# Patient Record
Sex: Male | Born: 1950
Health system: Southern US, Community
[De-identification: ages and names within clinical notes are randomized; demographics above are authoritative.]

## PROBLEM LIST (undated history)

## (undated) DIAGNOSIS — C801 Malignant (primary) neoplasm, unspecified: Secondary | ICD-10-CM

## (undated) DIAGNOSIS — N529 Male erectile dysfunction, unspecified: Secondary | ICD-10-CM

## (undated) DIAGNOSIS — J302 Other seasonal allergic rhinitis: Secondary | ICD-10-CM

## (undated) DIAGNOSIS — E042 Nontoxic multinodular goiter: Secondary | ICD-10-CM

## (undated) DIAGNOSIS — E785 Hyperlipidemia, unspecified: Secondary | ICD-10-CM

## (undated) DIAGNOSIS — N400 Enlarged prostate without lower urinary tract symptoms: Secondary | ICD-10-CM

## (undated) DIAGNOSIS — J40 Bronchitis, not specified as acute or chronic: Secondary | ICD-10-CM

## (undated) DIAGNOSIS — J189 Pneumonia, unspecified organism: Secondary | ICD-10-CM

## (undated) DIAGNOSIS — E0590 Thyrotoxicosis, unspecified without thyrotoxic crisis or storm: Secondary | ICD-10-CM

## (undated) DIAGNOSIS — N183 Chronic kidney disease, stage 3 unspecified: Secondary | ICD-10-CM

## (undated) DIAGNOSIS — K219 Gastro-esophageal reflux disease without esophagitis: Secondary | ICD-10-CM

## (undated) DIAGNOSIS — I10 Essential (primary) hypertension: Secondary | ICD-10-CM

## (undated) HISTORY — PX: BACK SURGERY: SHX140

## (undated) HISTORY — PX: COLONOSCOPY: SHX174

## (undated) HISTORY — DX: Pneumonia, unspecified organism: J18.9

## (undated) HISTORY — PX: MULTIPLE TOOTH EXTRACTIONS: SHX2053

## (undated) HISTORY — DX: Malignant (primary) neoplasm, unspecified: C80.1

---

## 2002-08-09 LAB — HM COLONOSCOPY

## 2005-09-20 ENCOUNTER — Ambulatory Visit: Payer: Self-pay | Admitting: Family Medicine

## 2005-10-22 ENCOUNTER — Ambulatory Visit: Payer: Self-pay | Admitting: Family Medicine

## 2005-11-24 ENCOUNTER — Ambulatory Visit: Payer: Self-pay | Admitting: Family Medicine

## 2005-12-07 ENCOUNTER — Ambulatory Visit: Payer: Self-pay | Admitting: Family Medicine

## 2005-12-30 ENCOUNTER — Ambulatory Visit: Payer: Self-pay | Admitting: Family Medicine

## 2006-05-17 DIAGNOSIS — E785 Hyperlipidemia, unspecified: Secondary | ICD-10-CM | POA: Insufficient documentation

## 2006-05-17 DIAGNOSIS — I10 Essential (primary) hypertension: Secondary | ICD-10-CM | POA: Insufficient documentation

## 2006-06-21 ENCOUNTER — Ambulatory Visit: Payer: Self-pay | Admitting: Family Medicine

## 2007-04-18 ENCOUNTER — Ambulatory Visit: Payer: Self-pay | Admitting: Family Medicine

## 2007-05-17 ENCOUNTER — Encounter: Admission: RE | Admit: 2007-05-17 | Discharge: 2007-05-17 | Payer: Self-pay | Admitting: Family Medicine

## 2007-05-17 ENCOUNTER — Ambulatory Visit: Payer: Self-pay | Admitting: Family Medicine

## 2007-05-23 ENCOUNTER — Telehealth: Payer: Self-pay | Admitting: Family Medicine

## 2007-05-23 DIAGNOSIS — E041 Nontoxic single thyroid nodule: Secondary | ICD-10-CM | POA: Insufficient documentation

## 2007-05-24 ENCOUNTER — Encounter: Admission: RE | Admit: 2007-05-24 | Discharge: 2007-05-24 | Payer: Self-pay | Admitting: Family Medicine

## 2007-05-25 ENCOUNTER — Encounter: Payer: Self-pay | Admitting: Family Medicine

## 2007-05-25 LAB — CONVERTED CEMR LAB: T3, Free: 3.6 pg/mL (ref 2.3–4.2)

## 2007-05-26 ENCOUNTER — Telehealth: Payer: Self-pay | Admitting: Family Medicine

## 2007-06-09 ENCOUNTER — Encounter: Payer: Self-pay | Admitting: Family Medicine

## 2007-06-13 ENCOUNTER — Encounter: Admission: RE | Admit: 2007-06-13 | Discharge: 2007-06-13 | Payer: Self-pay | Admitting: Internal Medicine

## 2007-06-13 ENCOUNTER — Other Ambulatory Visit: Admission: RE | Admit: 2007-06-13 | Discharge: 2007-06-13 | Payer: Self-pay | Admitting: Interventional Radiology

## 2007-06-13 ENCOUNTER — Encounter (INDEPENDENT_AMBULATORY_CARE_PROVIDER_SITE_OTHER): Payer: Self-pay | Admitting: Interventional Radiology

## 2007-06-13 ENCOUNTER — Encounter: Payer: Self-pay | Admitting: Family Medicine

## 2007-06-20 ENCOUNTER — Encounter: Payer: Self-pay | Admitting: Family Medicine

## 2007-07-26 ENCOUNTER — Ambulatory Visit: Payer: Self-pay | Admitting: Family Medicine

## 2007-07-28 LAB — CONVERTED CEMR LAB: Chlamydia, Swab/Urine, PCR: NEGATIVE

## 2008-05-08 ENCOUNTER — Encounter: Payer: Self-pay | Admitting: Family Medicine

## 2009-10-03 ENCOUNTER — Ambulatory Visit: Payer: Self-pay | Admitting: Family Medicine

## 2010-06-26 ENCOUNTER — Telehealth: Payer: Self-pay | Admitting: Family Medicine

## 2010-07-10 ENCOUNTER — Encounter: Payer: Self-pay | Admitting: Family Medicine

## 2010-07-10 LAB — CONVERTED CEMR LAB
ALT: 41 units/L
Free T4: 0.68 ng/dL
Lymphocytes Relative: 33.1 %
Platelets: 247 10*3/uL
WBC: 5 10*3/uL

## 2010-07-23 ENCOUNTER — Ambulatory Visit: Payer: Self-pay | Admitting: Family Medicine

## 2010-07-23 DIAGNOSIS — N529 Male erectile dysfunction, unspecified: Secondary | ICD-10-CM | POA: Insufficient documentation

## 2010-09-02 ENCOUNTER — Encounter: Payer: Self-pay | Admitting: Family Medicine

## 2010-09-02 LAB — CONVERTED CEMR LAB: Total CHOL/HDL Ratio: 3.2

## 2010-09-08 NOTE — Progress Notes (Signed)
Summary: refill request   Phone Note Refill Request Message from:  Patient on June 26, 2010 9:05 AM  Pt needs a refill on Viagra sent to [facility] at the corner of [address] their phone number is [phone]... Please call patient back and let him know the status on this by calling back [phone]  Initial call taken by: Michaelle Copas,  June 26, 2010 9:05 AM  Follow-up for Phone Call        call and notified pt Follow-up by: Avon Gully CMA, Duncan Dull),  June 26, 2010 12:38 PM    New/Updated Medications: VIAGRA 50 MG TABS (SILDENAFIL CITRATE) Take 1 tablet by mouth once a day as needed 1 hour before intercourse. Prescriptions: VIAGRA 50 MG TABS (SILDENAFIL CITRATE) Take 1 tablet by mouth once a day as needed 1 hour before intercourse.  #10 x 0   Entered and Authorized by:   Nani Gasser MD   Signed by:   Nani Gasser MD on 06/26/2010   Method used:   Electronically to        [facility] [address] #[id number]* (retail)       [address] [place], [place]  [address]       Ph: [phone]       Fax: [phone]   RxID:   [id number]

## 2010-09-08 NOTE — Assessment & Plan Note (Signed)
Summary: Facial Pain   Vital Signs:  Patient profile:   60 year old male Height:      72 inches Weight:      189 pounds BMI:     25.73 Temp:     98.5 degrees F oral Pulse rate:   94 / minute BP sitting:   160 / 95  (left arm) Cuff size:   regular  Vitals Entered By: Kathlene November (October 03, 2009 9:47 AM) CC: had abcess tooth on Jan. 16th- feels like infection is in his ears, jaws and sinus cavity   Primary Care Provider:  Linford Arnold, C  CC:  had abcess tooth on Jan. 16th- feels like infection is in his ears and jaws and sinus cavity.  History of Present Illness: had abcess tooth on Jan. 16th- feels like infection is in his ears, jaws and sinus cavity. Went to the dentist and was told had an abscess nerve root with tracking infection.  Had root canal that day and was given an ABX (clindamycin for 10 days). Then had infection into his jaw and then last week had PNA. Now having left facial and jaw pain and pain behind his left eye.  Went on doxycycline for  PNA about a week ago.  On last tab of that this morning. Feels like has been hit in the eye. 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 NO worsening or alleviating sxs.   Current Medications (verified): 1)  Crestor 10 Mg Tabs (Rosuvastatin Calcium) .... Take 1 Tablet By Mouth Once A Day 2)  Ziac 2.5-6.25 Mg Tabs (Bisoprolol-Hydrochlorothiazide) .... Take 1 Tablet By Mouth Once A Day 3)  Augmentin 875-125 Mg Tabs (Amoxicillin-Pot Clavulanate) .... Take 1 Tablet By Mouth Two Times A Day For 10 Days 4)  Advair Diskus 250-50 Mcg/dose Aepb (Fluticasone-Salmeterol) .... One Puff Twice A Day 5)  Prevacid 30 Mg Cpdr (Lansoprazole) .... Take One Tablet By Mouth Once A Day 6)  Restoril 15 Mg Caps (Temazepam) .... Take One- Two Capsules By Mouth At Bedtime 7)  Proventil Hfa 108 (90 Base) Mcg/act Aers (Albuterol Sulfate) .... Use 2 Piuffs Four Times A Day 8)  Tapazole 10 Mg Tabs (Methimazole) .... Take One Tablet By Mouth Once A Day  Allergies  (verified): No Known Drug Allergies  Comments:  Nurse/Medical Assistant: The patient's medications and allergies were reviewed with the patient and were updated in the Medication and Allergy Lists. Kathlene November (October 03, 2009 9:49 AM)  Past History:  Past Medical History: Last updated: 05/17/2006 Gets recurrent PNA in [place]  Social History: Reviewed history from 05/17/2006 and no changes required. Works in Production designer, theatre/television/film for [employer].  27yrs education.  Never smoked, no drugs, no EtOH, 1 coffe daily, runner.  Physical Exam  General:  Well-developed,well-nourished,in no acute distress; alert,appropriate and cooperative throughout examination Head:  Normocephalic and atraumatic without obvious abnormalities. No apparent alopecia or balding.Tender over the TMJ area on teh left and over the maxillary sinuses.  Eyes:  No corneal or conjunctival inflammation noted. EOMI. Perrla.  Ears:  External ear exam shows no significant lesions or deformities.  Otoscopic examination reveals clear canals, tympanic membranes are intact bilaterally without bulging, retraction, inflammation or discharge. Hearing is grossly normal bilaterally. Nose:  External nasal examination shows no deformity or inflammation Mouth:  Oral mucosa and oropharynx without lesions or exudates.  Teeth in good repair. no gum lesions or swelling.  Neck:  No deformities, masses, or tenderness noted. Lungs:  Normal respiratory effort, chest expands symmetrically. Lungs are clear  to auscultation, no crackles or wheezes. Heart:  Normal rate and regular rhythm. S1 and S2 normal without gallop, murmur, click, rub or other extra sounds. Skin:  no rashes.   Cervical Nodes:  No lymphadenopathy noted Psych:  Cognition and judgment appear intact. Alert and cooperative with normal attention span and concentration. No apparent delusions, illusions, hallucinations   Impression & Recommendations:  Problem # 1:  FACIAL PAIN  (ICD-784.0) Unclear etiology. consider that is is a partially treaed abscess ir infection that may be tracking into the jaw.  Will start Augmentin for broad specrum coverage. also note BP is elevated today so also let hime know that this may be contributing to his pain.  Also monitor for any neurologic sxs.  If this happens over teh weekend needs to go to ED. Or if sxs worsen then need to go to ED because may need IV abx and CT scan. Rocephin IM 2gram given today.  His updated medication list for this problem includes:    Ziac 2.5-6.25 Mg Tabs (Bisoprolol-hydrochlorothiazide) .Marland Kitchen... Take 1 tablet by mouth once a day  Complete Medication List: 1)  Crestor 10 Mg Tabs (Rosuvastatin calcium) .... Take 1 tablet by mouth once a day 2)  Ziac 2.5-6.25 Mg Tabs (Bisoprolol-hydrochlorothiazide) .... Take 1 tablet by mouth once a day 3)  Augmentin 875-125 Mg Tabs (Amoxicillin-pot clavulanate) .... Take 1 tablet by mouth two times a day for 10 days 4)  Advair Diskus 250-50 Mcg/dose Aepb (Fluticasone-salmeterol) .... One puff twice a day 5)  Prevacid 30 Mg Cpdr (Lansoprazole) .... Take one tablet by mouth once a day 6)  Restoril 15 Mg Caps (Temazepam) .... Take one- two capsules by mouth at bedtime 7)  Proventil Hfa 108 (90 Base) Mcg/act Aers (Albuterol sulfate) .... Use 2 piuffs four times a day 8)  Tapazole 10 Mg Tabs (Methimazole) .... Take one tablet by mouth once a day Prescriptions: AUGMENTIN 875-125 MG TABS (AMOXICILLIN-POT CLAVULANATE) Take 1 tablet by mouth two times a day for 10 days  #20 x 0   Entered and Authorized by:   Nani Gasser MD   Signed by:   Nani Gasser MD on 10/03/2009   Method used:   Electronically to        [facility] [id number]* (retail)       [address] [place], [place]  [address]       Ph: [phone]       Fax: [phone]   RxID:   [id number]    Medication Administration  Injection # 1:    Medication: Rocephin  250mg     Route: IM     Site: RUOQ gluteus    Exp Date: 11/08/2011    Lot #: [id number]    Mfr: Novaplus    Comments: Rocephin 2 grams given    Patient tolerated injection without complications    Given by: Kathlene November (October 03, 2009 10:24 AM)  Orders Added: 1)  Est. Patient Level IV [72536]

## 2010-09-10 NOTE — Assessment & Plan Note (Signed)
Summary: Discuss his Viagra medication-   Vital Signs:  Patient profile:   60 year old male Height:      72 inches Weight:      173 pounds Pulse rate:   58 / minute BP sitting:   158 / 93  (right arm) Cuff size:   regular  Vitals Entered By: Avon Gully CMA, Duncan Dull) (July 23, 2010 9:11 AM)  Serial Vital Signs/Assessments:  Time      Position  BP       Pulse  Resp  Temp     By 9:33 AM             151/93                         Avon Gully CMA, (AAMA)  CC: discuss viagra meds   Primary Care Provider:  Linford Arnold, C  CC:  discuss viagra meds.  History of Present Illness: Lungs have been clear the last momths.   On the viagra. No CP, SOB, visionchanges. No sinus  congestion.  Happy with the dose. Uses about once a week but wants full  rx.   Brought in labs from PCP in [place]. cholesterol, etc looks great!!    Current Medications (verified): 1)  Crestor 10 Mg Tabs (Rosuvastatin Calcium) .... Take 1 Tablet By Mouth Once A Day 2)  Ziac 2.5-6.25 Mg Tabs (Bisoprolol-Hydrochlorothiazide) .... Take 1 Tablet By Mouth Once A Day 3)  Advair Diskus 250-50 Mcg/dose Aepb (Fluticasone-Salmeterol) .... One Puff Twice A Day 4)  Prevacid 30 Mg Cpdr (Lansoprazole) .... Take One Tablet By Mouth Once A Day 5)  Restoril 15 Mg Caps (Temazepam) .... Take One- Two Capsules By Mouth At Bedtime 6)  Proventil Hfa 108 (90 Base) Mcg/act Aers (Albuterol Sulfate) .... Use 2 Piuffs Four Times A Day 7)  Tapazole 10 Mg Tabs (Methimazole) .... Take One Tablet By Mouth Once A Day 8)  Viagra 50 Mg Tabs (Sildenafil Citrate) .... Take 1 Tablet By Mouth Once A Day As Needed 1 Hour Before Intercourse.  Allergies (verified): No Known Drug Allergies  Comments:  Nurse/Medical Assistant: The patient's medications and allergies were reviewed with the patient and were updated in the Medication and Allergy Lists. 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  Never smoked, no drugs, no EtOH, 1 coffe daily, runner.  Physical Exam  General:  Well-developed,well-nourished,in no acute distress; alert,appropriate and cooperative throughout examination Lungs:  Normal respiratory effort, chest expands symmetrically. Lungs are clear to auscultation, no crackles or wheezes. Heart:  Normal rate and regular rhythm. S1 and S2 normal without gallop, murmur, click, rub or other extra sounds.   Impression & Recommendations:  Problem # 1:  ERECTILE DYSFUNCTION, ORGANIC (ICD-607.84) Will refill medication but let pt know he really needs to get his BP under control.  His updated medication list for this problem includes:    Viagra 50 Mg Tabs (Sildenafil citrate) .Marland Kitchen... Take 1 tablet by mouth once a day as needed 1 hour before intercourse.  Problem # 2:  HYPERTENSION, BENIGN SYSTEMIC (ICD-401.1) I am a little confused about who is managing his BP. Hi BP is clearly not well controlled.  He says his PCP is in [place] so not sure why  he is coming to our office. I do note in the past often comes for acute visits.  I will send this note to his PCP.  His updated medication list for this problem includes:    Ziac 2.5-6.25 Mg Tabs (Bisoprolol-hydrochlorothiazide) .Marland Kitchen... Take 1 tablet by mouth once a day  Complete Medication List: 1)  Crestor 10 Mg Tabs (Rosuvastatin calcium) .... Take 1 tablet by mouth once a day 2)  Ziac 2.5-6.25 Mg Tabs (Bisoprolol-hydrochlorothiazide) .... Take 1 tablet by mouth once a day 3)  Prevacid 30 Mg Cpdr (Lansoprazole) .... Take one tablet by mouth once a day 4)  Restoril 15 Mg Caps (Temazepam) .... Take one- two capsules by mouth at bedtime 5)  Proventil Hfa 108 (90 Base) Mcg/act Aers (Albuterol sulfate) .... Use 2 piuffs four  times a day 6)  Tapazole 10 Mg Tabs (Methimazole) .... Take one tablet by mouth once a day 7)  Viagra 50 Mg Tabs (Sildenafil citrate) .... Take 1 tablet by mouth once a day as needed 1 hour before intercourse. Prescriptions: VIAGRA 50 MG TABS (SILDENAFIL CITRATE) Take 1 tablet by mouth once a day as needed 1 hour before intercourse.  #30 x 3   Entered and Authorized by:   Nani Gasser MD   Signed by:   Nani Gasser MD on 07/23/2010   Method used:   [facility] to        [id number]       [address]       [place], [place]  [address]       Ph: [phone] or [phone]       Fax: [phone]   RxID:   [id number]    Orders Added: 1)  Est. Patient Level III [id number]

## 2010-09-22 ENCOUNTER — Encounter: Payer: Self-pay | Admitting: Family Medicine

## 2010-09-22 ENCOUNTER — Ambulatory Visit (INDEPENDENT_AMBULATORY_CARE_PROVIDER_SITE_OTHER): Payer: BC Managed Care – PPO | Admitting: Family Medicine

## 2010-09-22 DIAGNOSIS — J209 Acute bronchitis, unspecified: Secondary | ICD-10-CM | POA: Insufficient documentation

## 2010-09-22 DIAGNOSIS — I10 Essential (primary) hypertension: Secondary | ICD-10-CM

## 2010-09-23 ENCOUNTER — Encounter (INDEPENDENT_AMBULATORY_CARE_PROVIDER_SITE_OTHER): Payer: BC Managed Care – PPO

## 2010-09-23 ENCOUNTER — Encounter: Payer: Self-pay | Admitting: Family Medicine

## 2010-09-23 DIAGNOSIS — I10 Essential (primary) hypertension: Secondary | ICD-10-CM

## 2010-09-30 NOTE — Assessment & Plan Note (Signed)
Summary: Bronchitis   Vital Signs:  Patient profile:   60 year old male Height:      72 inches Weight:      173 pounds O2 Sat:      98 % Temp:     98.4 degrees F oral Pulse rate:   80 / minute BP sitting:   168 / 100  (right arm) Cuff size:   regular  Vitals Entered By: Avon Gully CMA, Duncan Dull) (September 22, 2010 8:54 AM) CC: pt feels he has walking pneumonia   Primary Care Provider:  Linford Arnold, C  CC:  pt feels he has walking pneumonia.  History of Present Illness: Note didn't take BP meds yet this AM. Usually takes with food but he hasn't eaten yet today. Says normall y BP well controlled.   Hasn't felt well for 4 weeks. Low grade temp in teh afternoons. ON the second round for doxycyline.  No CXR.  Feels not better. NO SOB or CP. No nausea.  Occ cough but is yellow and productive.  Using mucinex and using Advair. No GI sxs. Says doesn't feel horrible but doesn't feel good.  He has hx of recurrent PNA over the last 15 years.   REtinal detachment about 2 weeks ago. Seeing Dr. Clearance Coots.    Current Medications (verified): 1)  Crestor 10 Mg Tabs (Rosuvastatin Calcium) .... Take 1 Tablet By Mouth Once A Day 2)  Ziac 2.5-6.25 Mg Tabs (Bisoprolol-Hydrochlorothiazide) .... Take 1 Tablet By Mouth Once A Day 3)  Prevacid 30 Mg Cpdr (Lansoprazole) .... Take One Tablet By Mouth Once A Day 4)  Restoril 15 Mg Caps (Temazepam) .... Take One- Two Capsules By Mouth At Bedtime 5)  Proventil Hfa 108 (90 Base) Mcg/act Aers (Albuterol Sulfate) .... Use 2 Piuffs Four Times A Day 6)  Tapazole 10 Mg Tabs (Methimazole) .... Take One Tablet By Mouth Once A Day 7)  Viagra 50 Mg Tabs (Sildenafil Citrate) .... Take 1 Tablet By Mouth Once A Day As Needed 1 Hour Before Intercourse.  Allergies (verified): No Known Drug Allergies  Comments:  Nurse/Medical Assistant: The patient's medications and allergies were reviewed with the patient and were updated in the Medication and Allergy Lists. Avon Gully CMA, Duncan Dull) (September 22, 2010 8:56 AM)  Past History:  Past Medical History: Last updated: 05/17/2006 Gets recurrent PNA in [place]  Physical Exam  General:  Well-developed,well-nourished,in no acute distress; alert,appropriate and cooperative throughout examination Head:  Normocephalic and atraumatic without obvious abnormalities. No apparent alopecia or balding. Eyes:  No corneal or conjunctival inflammation noted. EOMI. Perrla. Ears:  External ear exam shows no significant lesions or deformities.  Otoscopic examination reveals clear canals, tympanic membranes are intact bilaterally without bulging, retraction, inflammation or discharge. Hearing is grossly normal bilaterally. Nose:  External nasal examination shows no deformity or inflammation.  Mouth:  Oral mucosa and oropharynx without lesions or exudates.  Teeth in good repair. Neck:  No deformities, masses, or tenderness noted. Lungs:  Corase BS at the bases bilaterally, rhonchi at the bases. No wheezing.  NO tachypnea or use of accessory muscles.  Heart:  Normal rate and regular rhythm. S1 and S2 normal without gallop, murmur, click, rub or other extra sounds. Skin:  no rashes.   Cervical Nodes:  No lymphadenopathy noted Psych:  Cognition and judgment appear intact. Alert and cooperative with normal attention span and concentration. No apparent delusions, illusions, hallucinations   Impression & Recommendations:  Problem # 1:  ACUTE BRONCHITIS (ICD-466.0) Since still having  fever in the afternoons so will change to a FQ. If not better in one week then will get a CXR. He really wants to avoid this if at all possible.  His updated medication list for this problem includes:    Proventil Hfa 108 (90 Base) Mcg/act Aers (Albuterol sulfate) ..... Use 2 piuffs four times a day    Levaquin 750 Mg Tabs (Levofloxacin) .Marland Kitchen... Take 1 tablet by mouth once a day x 5 days  Take antibiotics and other medications as directed. Encouraged  to push clear liquids, get enough rest, and take acetaminophen as needed. To be seen in 5-7 days if no improvement, sooner if worse.  Problem # 2:  HYPERTENSION, BENIGN SYSTEMIC (ICD-401.1) Recheck BP in about 2 weeks once feeling better and when has taken medication.  His updated medication list for this problem includes:    Ziac 2.5-6.25 Mg Tabs (Bisoprolol-hydrochlorothiazide) .Marland Kitchen... Take 1 tablet by mouth once a day  His updated medication list for this problem includes:    Ziac 2.5-6.25 Mg Tabs (Bisoprolol-hydrochlorothiazide) .Marland Kitchen... Take 1 tablet by mouth once a day  Complete Medication List: 1)  Crestor 10 Mg Tabs (Rosuvastatin calcium) .... Take 1 tablet by mouth once a day 2)  Ziac 2.5-6.25 Mg Tabs (Bisoprolol-hydrochlorothiazide) .... Take 1 tablet by mouth once a day 3)  Prevacid 30 Mg Cpdr (Lansoprazole) .... Take one tablet by mouth once a day 4)  Restoril 15 Mg Caps (Temazepam) .... Take one- two capsules by mouth at bedtime 5)  Proventil Hfa 108 (90 Base) Mcg/act Aers (Albuterol sulfate) .... Use 2 piuffs four times a day 6)  Tapazole 10 Mg Tabs (Methimazole) .... Take one tablet by mouth once a day 7)  Viagra 50 Mg Tabs (Sildenafil citrate) .... Take 1 tablet by mouth once a day as needed 1 hour before intercourse. 8)  Levaquin 750 Mg Tabs (Levofloxacin) .... Take 1 tablet by mouth once a day x 5 days  Patient Instructions: 1)  Follow up in one-two week for BP check when better.  2)  Call if not better in one week. Fatigue may linger longer.   Prescriptions: LEVAQUIN 750 MG TABS (LEVOFLOXACIN) Take 1 tablet by mouth once a day x 5 days  #5 x 0   Entered and Authorized by:   Nani Gasser MD   Signed by:   Nani Gasser MD on 09/22/2010   Method used:   [phone]       [address]       [place], [place]  [address]       Ph: [phone] or [phone]       Fax: [phone]   RxID:    [id number]    Orders Added: 1)  Est. Patient Level IV [[address]]

## 2010-09-30 NOTE — Assessment & Plan Note (Signed)
Summary: Nurse BP Check - jr  Nurse Visit   Vital Signs:  Patient profile:   60 year old male Pulse rate:   93 / minute BP sitting:   130 / 86  (right arm) Cuff size:   regular  Vitals Entered By: Avon Gully CMA, Duncan Dull) (September 23, 2010 2:15 PM) {/* 12-20-05: added doc.temps for primary and secondary malig (med oncology) */ if str(DOCUMENT.TEMP_CCC_PCP,DOCUMENT.TEMP_CCC_REF_PROV,DOCUMENT.TEMP_CCC_VISIT_TYPE,DOCUMENT.TEMP_CCC_CC,DOCUMENT.TEMP_CCC_HPI_MLEF,DOCUMENT.TEMP_CCC_HPI_MLEF_1,DOCUMENT.TEMP_CCC_HPI_MLEF_2,DOCUMENT.TEMP_CCC_HPI_MLEF_3,DOCUMENT.TEMP_CCC_HPI_MLEF_4,DOCUMENT.TEMP_CCC_MED_ONC_HPI_PRIMARY_CA,DOCUMENT.TEMP_CCC_MED_ONC_HPI_SECONDARY_CA)="" then "" else ccc_hpi_intro_txt(DOCUMENT.TEMP_CCC_PCP,DOCUMENT.TEMP_CCC_REF_PROV,DOCUMENT.TEMP_CCC_VISIT_TYPE,DOCUMENT.TEMP_CCC_CC,DOCUMENT.TEMP_CCC_HPI_MLEF,DOCUMENT.TEMP_CCC_HPI_MLEF_1,DOCUMENT.TEMP_CCC_HPI_MLEF_2,DOCUMENT.TEMP_CCC_HPI_MLEF_3,DOCUMENT.TEMP_CCC_HPI_MLEF_4,_ccc_HPI_SS_PCP_txt,DOCUMENT.TEMP_CCC_MED_ONC_HPI_PRIMARY_CA,DOCUMENT.TEMP_CCC_MED_ONC_HPI_SECONDARY_CA,_ccc_HPI_SS_visit_type_txt,_ccc_HPI_SS_ref_provider_txt,_ccc_HPI_SS_cc_txt) endif <-COMPILE ERROR NEARBY History of Present Illness: BP check    Allergies: No Known Drug Allergies  Orders Added: 1)  Est. Patient Level I [[address]]    Impression & Recommendations:  Problem # 1:  HYPERTENSION, BENIGN SYSTEMIC (ICD-401.1) Much improved.  Continue current meds. His updated medication list for this problem includes:    Ziac 2.5-6.25 Mg Tabs (Bisoprolol-hydrochlorothiazide) .Marland Kitchen... Take 1 tablet by mouth once a day  BP today: 130/86 Prior BP: 168/100 (09/22/2010)  Labs Reviewed: Chol: 178 (09/02/2010)   HDL: 55 (09/02/2010)   LDL: 94 (09/02/2010)   TG: 144 (09/02/2010)  Complete Medication List: 1)  Crestor 10 Mg Tabs (Rosuvastatin calcium) .... Take 1 tablet by mouth once a day 2)  Ziac 2.5-6.25 Mg Tabs (Bisoprolol-hydrochlorothiazide) .... Take 1 tablet by  mouth once a day 3)  Prevacid 30 Mg Cpdr (Lansoprazole) .... Take one tablet by mouth once a day 4)  Restoril 15 Mg Caps (Temazepam) .... Take one- two capsules by mouth at bedtime 5)  Proventil Hfa 108 (90 Base) Mcg/act Aers (Albuterol sulfate) .... Use 2 piuffs four times a day 6)  Tapazole 10 Mg Tabs (Methimazole) .... Take one tablet by mouth once a day 7)  Viagra 50 Mg Tabs (Sildenafil citrate) .... Take 1 tablet by mouth once a day as needed 1 hour before intercourse. 8)  Levaquin 750 Mg Tabs (Levofloxacin) .... Take 1 tablet by mouth once a day x 5 days

## 2010-10-29 ENCOUNTER — Encounter: Payer: Self-pay | Admitting: Family Medicine

## 2010-10-30 ENCOUNTER — Ambulatory Visit (INDEPENDENT_AMBULATORY_CARE_PROVIDER_SITE_OTHER): Payer: BC Managed Care – PPO | Admitting: Family Medicine

## 2010-10-30 VITALS — BP 125/80 | HR 82 | Ht 72.0 in | Wt 170.0 lb

## 2010-10-30 DIAGNOSIS — K6289 Other specified diseases of anus and rectum: Secondary | ICD-10-CM

## 2010-10-30 MED ORDER — LIDOCAINE-HYDROCORTISONE ACE 3-0.5 % RE KIT: PACK | RECTAL | Status: DC

## 2010-10-30 NOTE — Progress Notes (Signed)
  Subjective:    Patient ID: Victor Price, male    DOB: 10/03/1950, 60 y.o.   MRN: [id number]  HPI Rectal pain on and off for the last several years. Started again a couple of nights ago. It was severe and sudden and had to take hydrocodone to relieve his pain.  Can sit, twist, etc to ease the pain.  Triggered after sex but not with BM. But can happen at anytime and can wake him up at night.  No hemorrhoids.  Overdue for  Colonoscopy. No blood in the stool. Hx of fissures but doesn't feel the same. Usually lasts about an hour.  No problems urinating recently. Does work out regularly.    We can test testoterone pain.  Had checked it several years ago.    Review of Systems     Objective:   Physical Exam  Cardiovascular: Normal rate, regular rhythm and normal heart sounds.   Pulmonary/Chest: Effort normal and breath sounds normal.  Abdominal: Soft. Bowel sounds are normal.  Genitourinary: Rectum normal and prostate normal. Rectal exam shows no external hemorrhoid, no fissure, no mass, no tenderness and anal tone normal. Guaiac negative stool.          Assessment & Plan:  Rectal pain- Discussed based on his sxs, I don't think this is his prostate. Sound like rectal spasm. Can treat with a lidocaine cream prn. Can consdier a a cream with nitroglycerin to improve blood flow as well prn. Also he is due for his colonoscopy so recommend he f/u with GI for further evaluaation and recommendation Test- he would like to have his T checked. He denies any fatigue but thinks it might have been borderline a couple of years ago.  No change in sexual function.

## 2010-10-31 LAB — PSA: PSA: 1.27 ng/mL (ref <=4.00)

## 2010-11-02 ENCOUNTER — Telehealth: Payer: Self-pay | Admitting: Family Medicine

## 2010-11-02 LAB — TESTOSTERONE, FREE, TOTAL, SHBG
Sex Hormone Binding: 27 nmol/L (ref 13–71)
Testosterone, Free: 59.9 pg/mL (ref 47.0–244.0)
Testosterone: 274.34 ng/dL (ref 250–890)

## 2010-11-02 NOTE — Telephone Encounter (Signed)
Call pt: Test is definitely on the low end of normal.  If would like we can start test replacement gel that you apply daily to skin

## 2010-11-04 NOTE — Telephone Encounter (Signed)
Called pt and left message on vm and to call back if ok with gel and pharm info

## 2010-11-04 NOTE — Telephone Encounter (Signed)
Pt called and states he is ok with starting test gel and can send [facility] to [facility]

## 2010-11-05 ENCOUNTER — Telehealth: Payer: Self-pay | Admitting: Family Medicine

## 2010-11-05 MED ORDER — TESTOSTERONE 20.25 MG/ACT (1.62%) TD GEL: 2.0000 | Freq: Every day | TRANSDERMAL | Status: DC

## 2010-11-05 MED ORDER — TESTOSTERONE 30 MG/ACT TD SOLN: 2.0000 | Freq: Every day | TRANSDERMAL | Status: DC

## 2010-11-05 NOTE — Telephone Encounter (Signed)
Addended by: Nani Gasser on: 11/05/2010 08:02 AM   Modules accepted: Orders

## 2010-11-05 NOTE — Telephone Encounter (Signed)
Change b/c of cost/insurance.

## 2010-12-04 ENCOUNTER — Other Ambulatory Visit: Payer: Self-pay | Admitting: Family Medicine

## 2011-01-07 ENCOUNTER — Ambulatory Visit (INDEPENDENT_AMBULATORY_CARE_PROVIDER_SITE_OTHER): Payer: BC Managed Care – PPO | Admitting: Family Medicine

## 2011-01-07 ENCOUNTER — Encounter: Payer: Self-pay | Admitting: Family Medicine

## 2011-01-07 VITALS — BP 126/76 | HR 60 | Temp 98.3°F | Ht 72.0 in | Wt 173.0 lb

## 2011-01-07 DIAGNOSIS — J40 Bronchitis, not specified as acute or chronic: Secondary | ICD-10-CM

## 2011-01-07 MED ORDER — TESTOSTERONE 30 MG/ACT TD SOLN: 2.0000 | Freq: Every day | TRANSDERMAL | Status: DC

## 2011-01-07 MED ORDER — LEVOFLOXACIN 750 MG PO TABS: 750.0000 mg | ORAL_TABLET | Freq: Every day | ORAL | Status: AC

## 2011-01-07 NOTE — Progress Notes (Signed)
  Subjective:    Patient ID: Victor Price, male    DOB: Oct 04, 1950, 60 y.o.   MRN: [id number]  HPI Has had productive cough. Completed doxy and no improvement. Fever at night. Yellow sputum.  In the lower left lung.  No nasal sxs. No ST. No GI sxs. On OTC meds.  No ear pain.    Mild SOB. Says he doesn't think it is PNA yet. He has had this recurrently for 15 years.   Also due for refill on his Axiron.  Had labs checked in March.    Review of Systems     Objective:   Physical Exam  Constitutional: He is oriented to person, place, and time. He appears well-developed and well-nourished.  HENT:  Head: Normocephalic and atraumatic.  Right Ear: External ear normal.  Left Ear: External ear normal.  Nose: Nose normal.  Mouth/Throat: Oropharynx is clear and moist.       TMs and canals are clear.   Eyes: Conjunctivae and EOM are normal. Pupils are equal, round, and reactive to light.  Neck: Neck supple. No thyromegaly present.  Cardiovascular: Normal rate and normal heart sounds.   Pulmonary/Chest: Effort normal.       Diffuse rhonchi at the left lung base.    Lymphadenopathy:    He has no cervical adenopathy.  Neurological: He is alert and oriented to person, place, and time.  Skin: Skin is warm and dry.  Psychiatric: He has a normal mood and affect.          Assessment & Plan:  Acute bronchitis x 3 weeks. Failed doxy. Will tx with Levaquin. Last episode was 3 months ago.  Call if not better in 10 days or if suddenly gets worse.

## 2011-01-08 ENCOUNTER — Other Ambulatory Visit: Payer: Self-pay | Admitting: Family Medicine

## 2011-01-08 ENCOUNTER — Other Ambulatory Visit: Payer: Self-pay | Admitting: *Deleted

## 2011-01-08 NOTE — Telephone Encounter (Signed)
Pt came in the office and states rx has not been sent to pharm. Pt wants a 90 day supply.will call into [facility]

## 2011-01-08 NOTE — Telephone Encounter (Signed)
Pharmacy called and said they had faxed [name] 3 times for axiron medication for the patient.   Plan:  Reviewed, and informed [facility] that the script was sent electronically for #90 ml/3 refills. Victor Newcomer, LPN [facility]

## 2011-04-22 ENCOUNTER — Ambulatory Visit (INDEPENDENT_AMBULATORY_CARE_PROVIDER_SITE_OTHER): Payer: BC Managed Care – PPO | Admitting: Family Medicine

## 2011-04-22 ENCOUNTER — Encounter: Payer: Self-pay | Admitting: Family Medicine

## 2011-04-22 VITALS — BP 125/77 | HR 60 | Wt 172.0 lb

## 2011-04-22 DIAGNOSIS — K625 Hemorrhage of anus and rectum: Secondary | ICD-10-CM

## 2011-04-22 DIAGNOSIS — Z23 Encounter for immunization: Secondary | ICD-10-CM

## 2011-04-22 NOTE — Patient Instructions (Signed)
We will call you with your referral.

## 2011-04-22 NOTE — Progress Notes (Signed)
  Subjective:    Patient ID: Victor Price, male    DOB: 02/22/1951, 60 y.o.   MRN: [id number]  HPI  Has been bleeding intermittantly for one week. He descrobes it as bright red. Had more bleeding yesterday. Doesn't look mixed in with the stool. He is mostly seen on the elevator. No known hx of hemorrhoids.  No pain with BM.  No abodominal pain or cramping. No fever.  1 st cousin with crohns dz. No colon cancer in the family.    Review of Systems     Objective:   Physical Exam  Constitutional: He is oriented to person, place, and time. He appears well-developed and well-nourished.  Genitourinary:       He has a small external hemorrhoid at the 6:00 position which is not inflamed or irritated. I see no fissure or tearing of the skin. Hemoccult was positive for blood. He had normal rectal tone and no mass was palpated. His prostate is mildly enlarged but no nodules.  Neurological: He is oriented to person, place, and time.  Skin: Skin is warm and dry.  Psychiatric: He has a normal mood and affect. His behavior is normal.          Assessment & Plan:  Rectal bleeding-will refer him to gastroenterology for further evaluation. Most likely consistent with a fissure which are typically benign. We discussed working on keeping the stool soft. He says he already takes fiber supplements and doesn't feel that his stools are currently hard. He is overdue for his repeat colonoscopy by about a year as well. He does have a history of polyps.

## 2011-06-28 ENCOUNTER — Encounter: Payer: Self-pay | Admitting: Family Medicine

## 2011-08-17 ENCOUNTER — Other Ambulatory Visit: Payer: Self-pay | Admitting: *Deleted

## 2011-08-17 MED ORDER — TESTOSTERONE 30 MG/ACT TD SOLN: 2.0000 | Freq: Every day | TRANSDERMAL | Status: DC

## 2011-08-24 ENCOUNTER — Ambulatory Visit (INDEPENDENT_AMBULATORY_CARE_PROVIDER_SITE_OTHER): Payer: BC Managed Care – PPO | Admitting: Family Medicine

## 2011-08-24 ENCOUNTER — Encounter: Payer: Self-pay | Admitting: Family Medicine

## 2011-08-24 DIAGNOSIS — J189 Pneumonia, unspecified organism: Secondary | ICD-10-CM

## 2011-08-24 MED ORDER — LEVOFLOXACIN 750 MG PO TABS: 750.0000 mg | ORAL_TABLET | Freq: Every day | ORAL | Status: DC

## 2011-08-24 NOTE — Progress Notes (Signed)
  Subjective:    Patient ID: Victor Price, male    DOB: 10-29-1950, 61 y.o.   MRN: [id number]  HPI Broncitis for 6-8 weeks.  Cough and chst congestion.  He feels like it is PNA.  Took the doxycyline and felt better but then got worse again. Then started cefuroxime and doesn't feel any better. Fever at night.  No cough and cold meds. Taking zyrtec. No GI sxs. Occ sinus HA, no nasal congesteion. Occ SOB with cough. No chest pain. Hx of recurrent PNA.    Review of Systems     Objective:   Physical Exam  Constitutional: He is oriented to person, place, and time. He appears well-developed and well-nourished.  HENT:  Head: Normocephalic and atraumatic.  Right Ear: External ear normal.  Left Ear: External ear normal.  Nose: Nose normal.  Mouth/Throat: Oropharynx is clear and moist.       TMs and canals are clear.   Eyes: Conjunctivae and EOM are normal. Pupils are equal, round, and reactive to light.  Neck: Neck supple. No thyromegaly present.  Cardiovascular: Normal rate and normal heart sounds.   Pulmonary/Chest: Effort normal.       Crackles and rhonchi in the left base Some radiating ronchi in both upper lungs.   Lymphadenopathy:    He has no cervical adenopathy.  Neurological: He is alert and oriented to person, place, and time.  Skin: Skin is warm and dry.  Psychiatric: He has a normal mood and affect.          Assessment & Plan:  PNA - based on exam I do think he clinically has pneumonia. He  Has failed doxy and cefuroxime so will tx with a floroquinolone. I will treat with Levaquin 750 mg x7 days. If not better in one week we will get a chest x-ray to make sure no loculation of fluid. He says sometimes hanging outside the bar and having someone beast on his chest will actually make him feel better help him cough up a lot of sputum. I encouraged him to continue due to system is slightly nasal and chest PT. Declined cough med.   HTN -  Blood pressure is normally well  controlled.it is elevated today but may be secondary to his illness. I asked him to come back for blood pressure recheck in about 2 weeks.

## 2011-08-24 NOTE — Patient Instructions (Signed)
Call if not better in one week and will get a chest xray.     Pneumonia, Adult Pneumonia is an infection of the lungs. It may be caused by a germ (virus or bacteria). Some types of pneumonia can spread easily from person to person. This can happen when you cough or sneeze. HOME CARE  Only take medicine as told by your doctor.     Take your medicine (antibiotics) as told. Finish it even if you start to feel better.     Do not smoke.     You may use a vaporizer or humidifier in your room. This can help loosen thick spit (mucus).     Sleep so you are almost sitting up (semi-upright). This helps reduce coughing.     Rest.  A shot (vaccine) can help prevent pneumonia. Shots are often advised for:  People over 80 years old.     Patients on chemotherapy.     People with long-term (chronic) lung problems.     People with immune system problems.  GET HELP RIGHT AWAY IF:    You are getting worse.     You cannot control your cough, and you are losing sleep.     You cough up blood.     Your pain gets worse, even with medicine.     You have a fever.     Any of your problems are getting worse, not better.     You have shortness of breath or chest pain.  MAKE SURE YOU:    Understand these instructions.     Will watch your condition.     Will get help right away if you are not doing well or get worse.  Document Released: 01/12/2008 Document Revised: 04/07/2011 Document Reviewed: 10/16/2010 [facility] Patient Information 2012 [place], [place].

## 2011-08-30 ENCOUNTER — Other Ambulatory Visit: Payer: Self-pay | Admitting: Family Medicine

## 2011-09-03 ENCOUNTER — Encounter: Payer: Self-pay | Admitting: Family Medicine

## 2011-09-03 ENCOUNTER — Ambulatory Visit: Payer: BC Managed Care – PPO | Admitting: Family Medicine

## 2011-09-03 ENCOUNTER — Ambulatory Visit (INDEPENDENT_AMBULATORY_CARE_PROVIDER_SITE_OTHER): Payer: BC Managed Care – PPO | Admitting: Family Medicine

## 2011-09-03 DIAGNOSIS — J189 Pneumonia, unspecified organism: Secondary | ICD-10-CM

## 2011-09-03 MED ORDER — LEVOFLOXACIN 750 MG PO TABS: 750.0000 mg | ORAL_TABLET | Freq: Every day | ORAL | Status: DC

## 2011-09-03 NOTE — Patient Instructions (Signed)
Will add 3 more days of antibiotic. Call if not continuing to get better.

## 2011-09-03 NOTE — Progress Notes (Signed)
  Subjective:    Patient ID: JAELYNN CURRIER, male    DOB: 1951/03/03, 61 y.o.   MRN: [id number]  HPI F/U PNA - Completed medicine 2 days ago. Feels 50% better. Still getting more cough and phlegm in the evening. Says wonders if needs longer ABX course.  SOB is better too.  No fever.     Review of Systems     Objective:   Physical Exam  Constitutional: He is oriented to person, place, and time. He appears well-developed and well-nourished.  HENT:  Head: Normocephalic and atraumatic.  Cardiovascular: Normal rate, regular rhythm and normal heart sounds.   Pulmonary/Chest: Effort normal.       No crackels at the bases and moslty rhonchi radiating from the upper bronchus.   Neurological: He is alert and oriented to person, place, and time.  Skin: Skin is warm and dry.  Psychiatric: He has a normal mood and affect. His behavior is normal.          Assessment & Plan:  PNA - Much improved.  Will add 3 more days of levaquin. Cal if not completely better in one week. Keep an eye on BP.

## 2011-10-19 ENCOUNTER — Encounter: Payer: Self-pay | Admitting: Family Medicine

## 2011-10-19 ENCOUNTER — Ambulatory Visit (INDEPENDENT_AMBULATORY_CARE_PROVIDER_SITE_OTHER): Payer: BC Managed Care – PPO | Admitting: Family Medicine

## 2011-10-19 DIAGNOSIS — IMO0002 Reserved for concepts with insufficient information to code with codable children: Secondary | ICD-10-CM

## 2011-10-19 DIAGNOSIS — M545 Low back pain, unspecified: Secondary | ICD-10-CM

## 2011-10-19 MED ORDER — CELECOXIB 200 MG PO CAPS: 200.0000 mg | ORAL_CAPSULE | Freq: Every day | ORAL | Status: DC

## 2011-10-19 MED ORDER — PREDNISONE 20 MG PO TABS: ORAL_TABLET | ORAL | Status: DC

## 2011-10-19 MED ORDER — HYDROCODONE-ACETAMINOPHEN 5-325 MG PO TABS: 2.0000 | ORAL_TABLET | Freq: Four times a day (QID) | ORAL | Status: DC | PRN

## 2011-10-19 NOTE — Patient Instructions (Signed)
Herniated Disk   with Rehab Between each vertebrae of the spine exists a disk. These disks contain a jelly-like material that helps cushion the spinal column. Occasionally, damage to the supportive ligaments of the vertebrae causes a disk to shift from its normal alignment and place pressure on surrounding structures, such as the spinal cord. This is called a herniated (ruptured) disk. SYMPTOMS    Pain in the back, that often affects one side.   Pain that gets worse with movement, sneezing, coughing, or straining.   Muscle spasms in the back.   Pain, numbness, or weakness affecting one arm or leg (depending on whether injury is in the neck or low back).   Muscle loss (if the condition has become chronic).   Loss of stool (bowel) or urine (bladder) function.  CAUSES   Herniated disks result when a disk becomes weak. The disk eventually ruptures and places pressure on the spinal cord. Herniated disks may occur from sudden injury (acute trauma) such as heavy labor, or from ongoing (chronic) stress, such as obesity.   RISK INCREASES WITH:  Sports that involve downward or twisting pressure on the neck or spine (football, weightlifting, horseback riding competition, bowling, tennis, jogging, track, racquetball, gymnastics).   Poor strength and flexibility.   Failure to warm up properly before activity.   Family history of low back pain or disk disorders.   Previous back surgery (especially fusion).   Preexisting forward displacement of a vertebra (spondylolisthesis).   Poor technique when lifting.   Prolonged sitting, especially with poor posture.  PREVENTION  Learn and use proper technique when sitting or lifting.   Warm up and stretch properly before activity.   Maintain physical fitness:   Strength, flexibility, and endurance.   Cardiovascular fitness.   Maintain a healthy body weight.   If previously injured, avoid any intense physical activity that requires twisting of  the body under uncontrollable conditions.  PROGNOSIS   If treated properly, herniated disks are usually curable within 6 weeks. Sometimes, surgery is required.   RELATED COMPLICATIONS    Permanent numbness, weakness, or paralysis and muscle loss.   Chronic back pain.   Loss of bowel or bladder function.   Decreased sexual function.   Risks of surgery: infection, bleeding, injury to nerves (persistent or increased numbness, weakness, or paralysis), persistent back pain, and spinal headache.  TREATMENT Treatment first involves resting from any aggravating activities and the use of ice and medicine to reduce pain and inflammation. As muscle spasms begin to decrease, it is important to perform strengthening and stretching exercises of the back muscles. These will help teach and reinforce proper body posture. These exercises may be performed at home, or with a therapist. A therapist may complete a further evaluation and recommend additional treatments, such as ultrasound, traction (for herniated disks of the neck), a cervical collar (for herniated disks of the neck), or a corset or back brace (for herniated disks of the low back). Prolonged rest may do more harm than good. Your therapist will teach you proper techniques for performing simple activities, such as lifting an object off the floor or using proper posture while sitting. At night, it is advised that you sleep on your back, on a firm mattress, and place a pillow under your knees. Your caregiver may recommend oral steroids or an injection of corticosteroids in the space around the spinal cord (epidural space) in order to reduce pain and inflammation. For severe cases, surgery is recommended. MEDICATION  If pain medicine is needed, nonsteroidal anti-inflammatory medicines (aspirin and ibuprofen), or other minor pain relievers (acetaminophen), are often advised.   Do not take pain medicine for 7 days before surgery.   Prescription pain  relievers may be given if your caregiver thinks they are needed. Use only as directed and only as much as you need.   Ointments applied to the skin may be helpful.   Corticosteroid injections may be given. These injections should be reserved for the most serious cases, as they can only be given a certain number of times.   Oral steroids may be given to reduce inflammation, although not usually for severe (acute) injuries.  HEAT AND COLD  Cold treatment (icing) relieves pain and reduces inflammation. Cold treatment should be applied for 10 to 15 minutes every 2 to 3 hours, and immediately after activity that aggravates your symptoms. Use ice packs or an ice massage.   Heat treatment may be used before performing stretching and strengthening activities prescribed by your caregiver, physical therapist, or athletic trainer. Use a heat pack or a warm water soak.  SEEK MEDICAL CARE IF:    Symptoms get worse or do not improve in 2 to 4 weeks, despite treatment.   You develop loss of bowel or bladder function.   New, unexplained symptoms develop. (Drugs used in treatment may produce side effects.)  EXERCISES   RANGE OF MOTION (ROM) AND STRETCHING EXERCISES - Herniated Disk (Ruptured Disk) Most people with low back pain will find that their symptoms get worse with excessive bending forward (flexion) or arching at the low back (extension). The exercises that will help resolve your symptoms will focus on the opposite motion. Your physician, physical therapist or athletic trainer will help you determine which exercises will be most helpful to resolve your low back pain. Do not complete any exercises without first consulting with your caregiver. Discontinue any exercises that make your symptoms worse, until you speak to your caregiver. If you have pain, numbness or tingling that travels down into your buttocks, leg or foot, the goal of this therapy is for these symptoms to move closer to your back and to  eventually go away. Sometimes, these leg symptoms will get better, but your low back pain may get worse. This is typically an indication of progress in your rehabilitation. Be sure to be very alert to any changes in your symptoms and to the activities you have done in the 24 hours prior to the change. Sharing this information with your caregiver will allow him or her to best treat your condition. These exercises may help you when beginning to rehabilitate your injury. Your symptoms may go away with or without further involvement from your physician, physical therapist or athletic trainer. While completing these exercises, remember:    Restoring tissue flexibility helps normal motion to return to the joints. This allows healthier, less painful movement and activity.   An effective stretch should be held for at least 30 seconds.   A stretch should never be painful. You should only feel a gentle lengthening or release in the stretched tissue.  FLEXION RANGE OF MOTION AND STRETCHING EXERCISES: STRETCH - Flexion, Single Knee to Chest  Lie on a firm bed or floor, with both legs extended in front of you.   Keeping one leg in contact with the floor, bring your opposite knee to your chest. Hold your leg in place by either grabbing behind your thigh or at your knee.   Pull until you  feel a gentle stretch in your low back. Hold for __________ seconds.   Slowly release your grasp and repeat the exercise with the opposite side.  Repeat __________ times. Complete this exercise __________ times per day.   STRETCH - Flexion, Double Knee to Chest   Lie on a firm bed or floor, with both legs extended in front of you.   Keeping one leg in contact with the floor, bring your opposite knee to your chest.   Tense your stomach muscles to support your back and then lift your other knee to your chest. Hold your legs in place by either grabbing behind your thighs or at your knees.   Pull both knees toward your chest  until you feel a gentle stretch in your low back. Hold for __________ seconds.   Tense your stomach muscles and slowly return one leg at a time to the floor.  Repeat __________ times. Complete this exercise __________ times per day.   STRETCH - Low Trunk Rotation  Lie on a firm bed or floor. Keeping your legs in front of you, bend your knees so they are both pointed toward the ceiling and your feet are flat on the floor.   Extend your arms out to the side. This will stabilize your upper body by keeping your shoulders in contact with the floor.   Gently and slowly drop both knees together to one side, until you feel a gentle stretch in your low back. Hold for __________ seconds.   Tense your stomach muscles to support your low back as you bring your knees back to the starting position. Repeat the exercise while dropping both knees to the other side.  Repeat __________ times. Complete this exercise __________ times per day   EXTENSION RANGE OF MOTION AND FLEXIBILITY EXERCISES: STRETCH - Extension, Prone on Elbows   Lie on your stomach on the floor. (A bed will be too soft.) Place your palms about shoulder width apart.   Place your elbows under your shoulders. If this is too painful, stack pillows under your chest.   Allow your body to relax so that your hips drop lower and make contact more completely with the floor.   Hold this position for __________ seconds.   Slowly return to lying flat on the floor.  Repeat __________ times. Complete this exercise __________ times per day.   RANGE OF MOTION - Extension, Prone Press Ups   Lie on your stomach on the floor. (A bed will be too soft.) Place your palms about shoulder width apart and at the height of your head.   Keeping your back as relaxed as possible, slowly straighten your elbows while keeping your hips on the floor. You may adjust the placement of your hands to maximize your comfort. As you gain motion, your hands will come more  underneath your shoulders.   Hold this position for __________ seconds.   Slowly return to lying flat on the floor.  Repeat __________ times. Complete this exercise __________ times per day.   RANGE OF MOTION- Quadruped, Neutral Spine   Assume a hands and knees position on a firm surface. Keep your hands under your shoulders and your knees under your hips. You may place padding under your knees for comfort.   Drop your head and point your tail bone toward the ground below you. This will round out your low back like an angry cat. Hold this position for __________ seconds.   Slowly lift your head and release your tail  bone so that your back sags into a large arch, like an old horse.   Hold this position for __________ seconds.   Repeat this until you feel limber in your low back.   Now, find your "sweet spot." This will be the most comfortable position somewhere between the two previous positions. This is your neutral spine. Once you have found this position, tense your stomach muscles to support your low back.   Hold this position for __________ seconds.  Repeat __________ times. Complete this exercise __________ times per day.   STRENGTHENING EXERCISES - Herniated Disk (Ruptured Disk) These exercises may help you when beginning to rehabilitate your injury. These exercises should be done near your "sweet spot." This is the neutral, low-back arch, somewhere between fully rounded and fully arched, that is your least painful position. When performed in this safe range of motion, these exercises can be used for people who have either a flexion or extension based injury. These exercises may resolve your symptoms with or without further involvement from your physician, physical therapist or athletic trainer. While completing these exercises, remember:    Muscles can gain both the endurance and the strength needed for everyday activities through controlled exercises.   Complete these exercises as  instructed by your physician, physical therapist or athletic trainer. Increase the resistance and repetitions only as guided.   You may experience muscle soreness or fatigue, but the pain or discomfort you are trying to eliminate should never worsen during these exercises. If this pain does get worse, stop and make sure you are following the directions exactly. If the pain is still present after adjustments, discontinue the exercise until you can discuss the trouble with your clinician.  STRENGTHENING - Deep Abdominals, Pelvic Tilt   Lie on a firm bed or floor. Keeping your legs in front of you, bend your knees so they are both pointed toward the ceiling and your feet are flat on the floor.   Tense your lower abdominal muscles to press your low back into the floor. This motion will rotate your pelvis so that your tail bone is scooping upwards rather than pointing at your feet or into the floor.   With a gentle tension and even breathing, hold this position for __________ seconds.  Repeat __________ times. Complete this exercise __________ times per day.   STRENGTHENING - Abdominals, Crunches   Lie on a firm bed or floor. Keeping your legs in front of you, bend your knees so they are both pointed toward the ceiling and your feet are flat on the floor. Cross your arms over your chest.   Slightly tip your chin down without bending your neck.   Tense your abdominals and slowly lift your trunk high enough so that your shoulder blades are just off the floor. Lifting higher can put too much stress on the low back and does not further strengthen your abdominal muscles.   With control, return to the starting position.  Repeat __________ times. Complete this exercise __________ times per day.   STRENGTHENING - Quadruped, Opposite UE/LE Lift  Assume a hands and knees position on a firm surface. Keep your hands under your shoulders and your knees under your hips. You may place padding under your knees for  comfort.   Find your neutral spine and gently tense your abdominal muscles so that you can maintain this position. Your shoulders and hips should form a rectangle that is parallel with the floor and is not twisted.   Keeping  your trunk steady, lift your right hand no higher than your shoulder. Then lift your left leg no higher than your hip. Make sure you are not holding your breath. Hold this position for __________ seconds.   Continuing to keep your abdominal muscles tense and your back steady, slowly return to your starting position. Repeat with the opposite arm and leg.  Repeat __________ times. Complete this exercise __________ times per day.   STRENGTHENING - Lower Abdominals, Double Knee Lift  Lie on a firm bed or floor. Keeping your legs in front of you, bend your knees so they are both pointed toward the ceiling and your feet are flat on the floor.   Tense your abdominal muscles to brace your low back and slowly lift both of your knees until they come over your hips. Be certain not to hold your breath.   Hold for __________ seconds. Using your abdominal muscles, return to the starting position in a slow and controlled manner.  Repeat __________ times. Complete this exercise __________ times per day.   POSTURE AND BODY MECHANICS CONSIDERATIONS - Herniated Disc (Ruptured Disk) Keeping correct posture when sitting, standing or completing your activities will reduce the stress put on different body tissues, allowing injured tissues a chance to heal and limiting painful experiences. The following are general guidelines for improved posture. Your physician or physical therapist will provide you with any instructions specific to your needs. While reading these guidelines, remember:  The exercises prescribed by your provider will help you build the flexibility and strength to maintain correct postures.   The correct posture provides the best environment for your joints to work. All of your  joints have less wear and tear when properly supported by a spine with good posture. This means you will experience a healthier, less painful body.   Correct posture must be practiced with all of your activities, especially prolonged sitting and standing. Correct posture is as important when doing repetitive low-stress activities (typing) as it is when doing a single heavy-load activity (lifting).  RESTING POSITIONS Consider which positions are most painful for you when choosing a resting position. If you have pain with flexion-based activities (sitting, bending, stooping, squatting), choose a position that allows you to rest in a less flexed posture. You would want to avoid curling into a fetal position on your side. If your pain gets worse with extension-based activities (prolonged standing, working overhead), avoid resting in an extended position such as sleeping on your stomach. Most people will find more comfort when they rest with their spine in a more neutral position, neither too rounded nor too arched. Lying on a non-sagging bed on your side, with a pillow between your knees, or on your back with a pillow under your knees will often provide some relief. Keep in mind, being in any one position for a prolonged period of time, no matter how correct your posture, can still lead to stiffness. PROPER SITTING POSTURE In order to minimize stress and discomfort on your spine, you must sit with correct posture. Sitting with good posture should be effortless for a healthy body. Returning to good posture is a gradual process. Many people can work toward this most comfortably by using various supports until they have the flexibility and strength to maintain this posture on their own. When sitting with proper posture, your ears will fall over your shoulders and your shoulders will fall over your hips. You should use the back of the chair to support your upper back.  Your low back will be in a neutral position, just  slightly arched. You may place a small pillow or folded towel at the base of your low back for support.   When working at a desk, create an environment that supports good, upright posture. Without extra support, muscles tire, which leads to excessive strain on joints and other tissues. Keep these recommendations in mind. CHAIR  A chair should be able to slide under your desk when your back makes contact with the back of the chair. This allows you to work closely.   The chair's height should allow your eyes to be level with the upper part of your monitor and your hands to be slightly lower than your elbows.  BODY POSITION  Your feet should make contact with the floor. If this is not possible, use a foot rest.   Keep your ears over your shoulders. This will reduce stress on your neck and low back.  INCORRECT SITTING POSTURES  If you are feeling tired and unable to assume a healthy sitting posture, do not slouch or slump. This puts excessive strain on your back tissues, causing more damage and pain. Healthier options include:  Using more support, like a lumbar pillow.   Switching tasks, to something that requires you to be upright or walking.   Talking a brief walk.   Lying down to rest in a neutral-spine position.  PROLONGED STANDING WHILE SLIGHTLY LEANING FORWARD  When completing a task that requires you to lean forward while standing in one place for a long time, place either foot up on a stationary 2-4 inch high object, to help maintain the best posture. When both feet are on the ground, the low back tends to lose its slight inward curve. If this curve flattens (or becomes too large), the back and your other joints will experience too much stress, tire more quickly and can cause pain. CORRECT STANDING POSTURES Proper standing posture should be assumed with all daily activities, even if they only take a few moments, like when brushing your teeth. As in sitting, your ears should fall over  your shoulders and your shoulders should fall over your hips. You should keep a slight tension in your abdominal muscles to brace your spine. Your tailbone should point down to the ground, not behind your body, resulting in an over-extended swayback posture.   INCORRECT STANDING POSTURES  Common incorrect standing postures include a forward head, locked knees or an excessive swayback. WALKING Walk with an upright posture. Your ears, shoulders and hips should all line-up. PROLONGED ACTIVITY IN A FLEXED POSITION When completing a task that requires you to bend forward at your waist or lean over a low surface, try finding a way to stabilize 3 out of 4 of your limbs. You can place a hand or elbow on your thigh, or rest a knee on the surface you are reaching across. This will provide you more stability so that your muscles do not tire as quickly. By keeping your knees relaxed, or slightly bent, you will also reduce stress across your low back. CORRECT LIFTING TECHNIQUES DO :   Assume a wide stance. This will provide you more stability and the opportunity to get as close as possible to the object you are lifting.   Tense your abdominals to brace your spine. Then, bend at the knees and hips. Keeping your back locked in a neutral-spine position, lift using your leg muscles. Lift with your legs, keeping your back straight.  Test the weight of unknown objects before attempting to lift them.   Try to keep your elbows down by your sides, in order get the best strength from your shoulders when carrying an object.   Always ask for help when lifting heavy or awkward objects.  INCORRECT LIFTING TECHNIQUES DO NOT:   Lock your knees when lifting, even if it is a small object.   Bend and twist. Pivot at your feet or move your feet when needing to change directions.   Assume that you can safely pick up even a paper clip, without proper posture.  Document Released: 07/26/2005 Document Revised: 07/15/2011  Document Reviewed: 11/07/2008 [facility] Patient Information 2012 [place], [place].

## 2011-10-19 NOTE — Progress Notes (Signed)
  Subjective:    Patient ID: Victor Price, male    DOB: [birth year], 61 y.o.   MRN: [id number]  HPI Low back pain. Worse at night.  Run down the top of the right led to about mid tibia.  Hx of low back surgery back in the 80s and this is similar pain.   Taking 2 Advil, 2 tylenol 1/4 hydrocodone at bedtime and this works well for about 4 hours. Used to use celebrex and that would help in the past.  Pain is worse with laying flat at night. Doesn' really bother him that much during the day.    Review of Systems  BP 126/77  Pulse 72  Ht 6' (1.829 m)  Wt 175 lb (79.379 kg)  BMI 23.73 kg/m2    No Known Allergies  Past Medical History  Diagnosis Date  . Pneumonia     gets recurrent PNA in left lung    No past surgical history on file.  History   Social History  . Marital Status: Divorced    Spouse Name: N/A    Number of Children: N/A  . Years of Education: N/A   Occupational History  . Not on file.   Social History Main Topics  . Smoking status: Never Smoker   . Smokeless tobacco: Not on file  . Alcohol Use: No  . Drug Use: No  . Sexually Active:    Other Topics Concern  . Not on file   Social History Narrative  . No narrative on file    No family history on file.      Objective:   Physical Exam  Constitutional: He appears well-developed and well-nourished.  Musculoskeletal:       HIp, knee and ankles strength 5/5 bilat. Lumbar flexion, extension, rotation right and left and side bending is symmetric. + straight leg raise on the right when flexes his neck.   Neurological:       Patellar reflex 2+ on the left, reflex 0+ on the right patellar.   Skin: Skin is warm and dry.  Psychiatric: He has a normal mood and affect. His behavior is normal.          Assessment & Plan:  Low back pain, herniated disc - Discussed treatment.  We discussed starting with a course of prednisone for 10 days. Does respond well to Celebrex in the past he can start that prescription  after completing the prednisone and use as needed. Picture taken with food and water to avoid any GI upset. Also gave them a handout of stretching exercises he can start to get home. It is not getting significantly better in the next 3-4 weeks and consider formal physical therapy and possible further evaluation with MRI if he is still having radiation of pain into his right leg.

## 2011-11-02 ENCOUNTER — Encounter: Payer: Self-pay | Admitting: Family Medicine

## 2011-11-02 ENCOUNTER — Ambulatory Visit (INDEPENDENT_AMBULATORY_CARE_PROVIDER_SITE_OTHER): Payer: BC Managed Care – PPO | Admitting: Family Medicine

## 2011-11-02 VITALS — BP 140/94 | HR 63 | Ht 72.0 in | Wt 183.0 lb

## 2011-11-02 DIAGNOSIS — R202 Paresthesia of skin: Secondary | ICD-10-CM

## 2011-11-02 DIAGNOSIS — Z23 Encounter for immunization: Secondary | ICD-10-CM

## 2011-11-02 DIAGNOSIS — M549 Dorsalgia, unspecified: Secondary | ICD-10-CM

## 2011-11-02 DIAGNOSIS — G5790 Unspecified mononeuropathy of unspecified lower limb: Secondary | ICD-10-CM

## 2011-11-02 DIAGNOSIS — R209 Unspecified disturbances of skin sensation: Secondary | ICD-10-CM

## 2011-11-02 MED ORDER — DICLOFENAC SODIUM 75 MG PO TBEC: 75.0000 mg | DELAYED_RELEASE_TABLET | Freq: Two times a day (BID) | ORAL | Status: DC

## 2011-11-02 NOTE — Progress Notes (Signed)
Addended by: Wyline Beady on: 11/02/2011 02:56 PM   Modules accepted: Orders

## 2011-11-02 NOTE — Patient Instructions (Signed)
Call if numbness progresses or if getting wounds because leg feels numb or if notices weakness in his strength

## 2011-11-02 NOTE — Progress Notes (Signed)
  Subjective:    Patient ID: Victor Price, male    DOB: [birth year], 61 y.o.   MRN: [id number]  HPI Seen here for back pain previously and was having pain into the right leg.  The prednisone seemed to help.  Didn't feel the celebrex bc $177.  Using tylenol and Advil occ.  Saw now the right leg feels numb from the knee to foot anteriorly. No weakness or easy fatigue. No worsening or alleviating symptoms. No trauma. Long hx of back pain.    Review of Systems     Objective:   Physical Exam  Constitutional: He appears well-developed.  Musculoskeletal:       Nontender oer the low back. Hip, knee and ankle flexion and extenskion is normal. Strength 5/5 bialteally. No weakness.  Patellar  refelexes 2+ bilaterally.   Skin: Skin is warm and dry.  Psychiatric: He has a normal mood and affect. His behavior is normal.   He has decreased sensation over the anterior shin. He is able to feel the monofilament on his posterior calf.      Assessment & Plan:  BAck Pain with numbness in L4-L5 distribution -  discussed different options. Because of the numbness I did encourage him to consider an MRI. He says even if it did show some nerve impingement he is not interested in any type of surgery at this point in time. Thus we discussed just monitoring it for now. He will watch out for signs of increasing numbness, wounds that put him at risk for infection, and actual weakness of the extremity. The expenses anything then we'll consider further evaluation by referral possible MRI of the lumbar spine. I do not suspect other types of neuropathy such as B12 deficiency, because it is one-sided.  Check the skin daily since he does have decreased sensation.  Monitor for injuries. Given a prescription for diclofenac. [place] 2. Celebrex. Make sure take with food and water to avoid GI irritation.  Tdap given today.

## 2011-11-18 ENCOUNTER — Telehealth: Payer: Self-pay | Admitting: *Deleted

## 2011-11-18 ENCOUNTER — Ambulatory Visit: Payer: BC Managed Care – PPO

## 2011-11-18 NOTE — Telephone Encounter (Signed)
Pt.notified

## 2011-11-18 NOTE — Telephone Encounter (Signed)
Pt called and wanted to know if he needed the shingles vaccine. I looked in chart and didn't see why he would need it now but please advise

## 2011-11-18 NOTE — Telephone Encounter (Signed)
Yes since over 50 I would recommend it. It is expensive so I recommend he call his insurance to make sure it is covered. If so then can make nurse appt to get it here.

## 2011-11-19 ENCOUNTER — Ambulatory Visit (INDEPENDENT_AMBULATORY_CARE_PROVIDER_SITE_OTHER): Payer: BC Managed Care – PPO | Admitting: Physician Assistant

## 2011-11-19 VITALS — BP 129/79 | HR 84

## 2011-11-19 DIAGNOSIS — Z23 Encounter for immunization: Secondary | ICD-10-CM

## 2011-11-19 NOTE — Progress Notes (Signed)
  Subjective:    Patient ID: Victor Price, male    DOB: 1951/02/26, 61 y.o.   MRN: [id number]  HPI Shingles vaccine.   Review of Systems     Objective:   Physical Exam        Assessment & Plan:  Patient presented to clinic to get IM Zostavax.

## 2011-11-19 NOTE — Progress Notes (Signed)
  Subjective:    Patient ID: Victor Price, male    DOB: 1951-08-07, 61 y.o.   MRN: [id number] zostavax injection HPI    Review of Systems     Objective:   Physical Exam        Assessment & Plan:

## 2011-12-03 ENCOUNTER — Telehealth: Payer: Self-pay | Admitting: *Deleted

## 2011-12-03 MED ORDER — LORATADINE-PSEUDOEPHEDRINE ER 10-240 MG PO TB24: 1.0000 | ORAL_TABLET | Freq: Every day | ORAL | Status: DC

## 2011-12-03 NOTE — Telephone Encounter (Signed)
Pt would like a rx for Claritin D because he states his allergies are bothering him

## 2011-12-03 NOTE — Telephone Encounter (Signed)
OK, rx sent.  His insurance may not cover since now OTC.

## 2011-12-14 ENCOUNTER — Encounter: Payer: Self-pay | Admitting: Family Medicine

## 2011-12-14 ENCOUNTER — Ambulatory Visit (INDEPENDENT_AMBULATORY_CARE_PROVIDER_SITE_OTHER): Payer: BC Managed Care – PPO | Admitting: Family Medicine

## 2011-12-14 VITALS — BP 113/76 | HR 100 | Ht 75.0 in | Wt 168.0 lb

## 2011-12-14 DIAGNOSIS — N400 Enlarged prostate without lower urinary tract symptoms: Secondary | ICD-10-CM | POA: Insufficient documentation

## 2011-12-14 DIAGNOSIS — R3911 Hesitancy of micturition: Secondary | ICD-10-CM

## 2011-12-14 DIAGNOSIS — R300 Dysuria: Secondary | ICD-10-CM

## 2011-12-14 LAB — PSA: PSA: 1.44 ng/mL (ref <=4.00)

## 2011-12-14 LAB — POCT URINALYSIS DIPSTICK
Bilirubin, UA: NEGATIVE
Glucose, UA: NEGATIVE
Leukocytes, UA: NEGATIVE
Urobilinogen, UA: 0.2
pH, UA: 6

## 2011-12-14 LAB — COMPLETE METABOLIC PANEL WITH GFR
AST: 25 U/L (ref 0–37)
Albumin: 4.3 g/dL (ref 3.5–5.2)
BUN: 28 mg/dL — ABNORMAL HIGH (ref 6–23)
CO2: 27 mEq/L (ref 19–32)
Glucose, Bld: 89 mg/dL (ref 70–99)
Potassium: 4.7 mEq/L (ref 3.5–5.3)

## 2011-12-14 NOTE — Patient Instructions (Signed)
Benign Prostatic Hyperplasia You have an enlarged prostate. This is common in elderly males. It is called BPH. This stands for benign prostate hyperplasia. The prostate gland is located in base of the bladder. When it grows, the prostate blocks the urethra. This is the tube which drains urine from the bladder.  SYMPTOMS  Weak urine stream.   Dribbling.   Feeling like the bladder has not emptied completely.   Difficulty starting urination.   Getting up frequently at night to urinate.   Urinating more frequently during the day.  Complete urinary blockage or severe pain with urination requires immediate attention. DIAGNOSIS   Your caregiver often has a good idea what is wrong by taking a history and doing a physical exam.   Special x-rays may be done.  TREATMENT   For mild problems, no treatment may be necessary.   If the problems are moderate, medications may provide relief. Some of these work by making the prostate gland smaller. The herb saw palmetto is commonly used.   If complete blockage occurs, a Foley catheter is usually left in place for a few days.   Surgery is often needed for more severe problems. TURP is the prostate surgery for BPH which is done through the urethra. TURP stands for transurethral resection of the prostate. It involves cutting away chips from the prostate. It is done by removing chips so that they can come out through the penis.   Techniques using heat, microwave and laser to remove the prostate blockage are also being used.  HOME CARE INSTRUCTIONS   Give yourself time when you urinate.   Stay away from alcohol.   Beverages containing caffeine such as coffee, tea and colas can make the problems worse.   Decongestants, antihistamines, and some prescription medicines can also make the problem worse.   Follow up with your caregiver for further treatment as recommended.  SEEK IMMEDIATE MEDICAL CARE IF:   You develop increased pain with urination or  are unable to pass your water.   You develop severe abdominal pain, vomiting, a high fever, or fainting.   You develop back pain or blood in your urine.  MAKE SURE YOU:   Understand these instructions.   Will watch your condition.   Will get help right away if you are not doing well or get worse.  Document Released: 07/26/2005 Document Revised: 07/15/2011 Document Reviewed: 03/31/2007 [employer] Patient Information 2012 [employer]. 

## 2011-12-14 NOTE — Progress Notes (Addendum)
  Subjective:    Patient ID: Victor Price, male    DOB: 08/11/1950, 61 y.o.   MRN: [id number]  HPI Trouble urinating the last couple of weeks. Stopped his test to see if better off it but no change.  Used to get up 1-2 times at night for years.  Hard to empty.   No hx or renal stones.  No pain.  occ stops and starts.  No recent fever. No hematuria.  No family history of prostate cancer or prostate problems. He does currently take testosterone replacement.  Though, he only recently restarted it. He has been off of it for almost a year. His last PSA was the most year ago. His last testosterone level was also about a year ago.   Review of Systems     Objective:   Physical Exam  Constitutional: He is oriented to person, place, and time. He appears well-developed and well-nourished.  HENT:  Head: Normocephalic and atraumatic.  Genitourinary: Guaiac positive stool.       Prostate is midly enlarge. No nodules. Non tender. No bogginess  Neurological: He is alert and oriented to person, place, and time.  Skin: Skin is warm and dry.  Psychiatric: He has a normal mood and affect. His behavior is normal.          Assessment & Plan:  Urinary hesitancy-most likely related to benign prostatic hypertrophy. His AUA score was 20 today which is severe. We will check his PSA.  His plan was enlarged on exam today. We discussed that he certainly could take a medication to help control his symptoms especially because the symptoms are severe. He seems very hesitant to do that at this time.UA is neg for infection.   Hypogonadism-we also need to recheck his testosterone level since he went back on the medication for a couple weeks to make sure that he is therapeutic. he has noticed difficulty in maintaining muscle mass over the last 2 years and wants to know what he can do to improve that. I explained that getting back on testosterone can often help this. He has an adequate protein intake. He works out  regularly.  Hemoccult was positive but he had colonoscopy a couple months ago and it was normal except for a internal hemorrhoid that does bleed from time to time.

## 2012-01-06 ENCOUNTER — Telehealth: Payer: Self-pay | Admitting: Family Medicine

## 2012-01-06 DIAGNOSIS — E291 Testicular hypofunction: Secondary | ICD-10-CM

## 2012-01-06 NOTE — Telephone Encounter (Signed)
Patient called left voicemail that he would like to have blood test and testosterone levels check'd. Pt req a call back to know if he can have a lab order sent down.

## 2012-01-10 ENCOUNTER — Other Ambulatory Visit: Payer: Self-pay | Admitting: *Deleted

## 2012-01-10 MED ORDER — TESTOSTERONE 30 MG/ACT TD SOLN: 2.0000 | Freq: Every day | TRANSDERMAL | Status: DC

## 2012-01-23 ENCOUNTER — Other Ambulatory Visit: Payer: Self-pay | Admitting: Family Medicine

## 2012-03-01 ENCOUNTER — Other Ambulatory Visit: Payer: Self-pay | Admitting: Family Medicine

## 2012-03-01 ENCOUNTER — Telehealth: Payer: Self-pay | Admitting: *Deleted

## 2012-03-01 DIAGNOSIS — E291 Testicular hypofunction: Secondary | ICD-10-CM

## 2012-03-01 NOTE — Telephone Encounter (Signed)
Lab slip for testosterone

## 2012-03-03 LAB — TESTOSTERONE, FREE, TOTAL, SHBG: Sex Hormone Binding: 29 nmol/L (ref 13–71)

## 2012-03-06 LAB — PSA: PSA: 1.6 ng/mL (ref <=4.00)

## 2012-04-15 ENCOUNTER — Other Ambulatory Visit: Payer: Self-pay | Admitting: Family Medicine

## 2012-04-21 ENCOUNTER — Telehealth: Payer: Self-pay | Admitting: *Deleted

## 2012-04-21 NOTE — Telephone Encounter (Signed)
PA obtained for Axiron. Case ID # is [id number] good from 04/21/12 to 04/21/13.

## 2012-05-12 ENCOUNTER — Other Ambulatory Visit: Payer: Self-pay | Admitting: *Deleted

## 2012-05-12 MED ORDER — CELECOXIB 200 MG PO CAPS: 200.0000 mg | ORAL_CAPSULE | Freq: Every day | ORAL | Status: DC

## 2012-05-16 ENCOUNTER — Telehealth: Payer: Self-pay | Admitting: *Deleted

## 2012-05-16 NOTE — Telephone Encounter (Signed)
Patient wants to know if you would write him an RX for a inversion table?

## 2012-05-16 NOTE — Telephone Encounter (Signed)
Patient wants to know if you would write him a RX for inversion table?

## 2012-05-17 MED ORDER — AMBULATORY NON FORMULARY MEDICATION: Status: DC

## 2012-05-17 NOTE — Telephone Encounter (Signed)
Yes. Prescription presented. We can mail it to him , or he can come by and pick it up.

## 2012-06-12 ENCOUNTER — Other Ambulatory Visit: Payer: Self-pay | Admitting: Family Medicine

## 2012-06-13 NOTE — Telephone Encounter (Signed)
Patient aware, the prescription is up front.

## 2012-07-03 ENCOUNTER — Other Ambulatory Visit: Payer: Self-pay | Admitting: Family Medicine

## 2012-08-14 ENCOUNTER — Other Ambulatory Visit: Payer: Self-pay | Admitting: Family Medicine

## 2012-08-17 ENCOUNTER — Ambulatory Visit (INDEPENDENT_AMBULATORY_CARE_PROVIDER_SITE_OTHER): Payer: BC Managed Care – PPO | Admitting: Family Medicine

## 2012-08-17 ENCOUNTER — Encounter: Payer: Self-pay | Admitting: Family Medicine

## 2012-08-17 VITALS — BP 102/66 | HR 87 | Temp 98.6°F | Resp 18 | Wt 170.0 lb

## 2012-08-17 DIAGNOSIS — E291 Testicular hypofunction: Secondary | ICD-10-CM

## 2012-08-17 DIAGNOSIS — J189 Pneumonia, unspecified organism: Secondary | ICD-10-CM

## 2012-08-17 DIAGNOSIS — E785 Hyperlipidemia, unspecified: Secondary | ICD-10-CM

## 2012-08-17 DIAGNOSIS — I10 Essential (primary) hypertension: Secondary | ICD-10-CM

## 2012-08-17 MED ORDER — SILDENAFIL CITRATE 50 MG PO TABS: 50.0000 mg | ORAL_TABLET | Freq: Every day | ORAL | Status: DC | PRN

## 2012-08-17 MED ORDER — BISOPROLOL-HYDROCHLOROTHIAZIDE 2.5-6.25 MG PO TABS: 1.0000 | ORAL_TABLET | Freq: Every day | ORAL | Status: DC

## 2012-08-17 MED ORDER — DICLOFENAC SODIUM 75 MG PO TBEC: 75.0000 mg | DELAYED_RELEASE_TABLET | Freq: Two times a day (BID) | ORAL | Status: DC

## 2012-08-17 MED ORDER — LEVOFLOXACIN 750 MG PO TABS: 750.0000 mg | ORAL_TABLET | Freq: Every day | ORAL | Status: AC

## 2012-08-17 MED ORDER — TESTOSTERONE 30 MG/ACT TD SOLN: 2.0000 | Freq: Every morning | TRANSDERMAL | Status: DC

## 2012-08-17 NOTE — Patient Instructions (Signed)

## 2012-08-17 NOTE — Progress Notes (Signed)
  Subjective:    Patient ID: Victor Price, male    DOB: 09/23/1950, 62 y.o.   MRN: [id number]  HPI 2 days of fever with cough and wheezing. No sig SOB.  No GI sxs.  No meds for relief.  No ST or HA or ear pain. Says usually gets a salty taste in his mouth when he gets PNA.   8 weeks ago was on doxycycline for bronchitis. He unfortunately gets pneumonia about once or twice a year.  HTN-  Pt denies chest pain, SOB, dizziness, or heart palpitations.  Taking meds as directed w/o problems.  Denies medication side effects.    Hyper lipidemia-he says he is taking his statin regularly. He denies needing refills today. No myalgias or side effects.  Hypogonadism-he's doing well on his AXIRON would like a refill today. He thinks he may be due for blood work is not sure. No change in mood or irritability. No problems with medication. He's currently doing 2 actuations per day.    Review of Systems     Objective:   Physical Exam  Constitutional: He is oriented to person, place, and time. He appears well-developed and well-nourished.  HENT:  Head: Normocephalic and atraumatic.  Right Ear: External ear normal.  Left Ear: External ear normal.  Nose: Nose normal.  Mouth/Throat: Oropharynx is clear and moist.       TMs and canals are clear.   Eyes: Conjunctivae normal and EOM are normal. Pupils are equal, round, and reactive to light.  Neck: Neck supple. No thyromegaly present.  Cardiovascular: Normal rate and normal heart sounds.   Pulmonary/Chest: Effort normal and breath sounds normal.       Diffuse wheezing.   Lymphadenopathy:    He has no cervical adenopathy.  Neurological: He is alert and oriented to person, place, and time.  Skin: Skin is warm and dry.  Psychiatric: He has a normal mood and affect.          Assessment & Plan:  PNA-uncontrolled ventricular pneumonia without a chest x-ray. He has diffuse wheezing and rhonchi in his lungs. His pulse ox is down just a little bit at 97%. I  will put him on Levaquin which has responded to well in the past. He had doxycycline about 8 weeks ago. Call if not significantly better in one week and will do a chest x-ray at that point if not improving. Call if fevers are not resolving in the next 72 hours.  Hypertension-well controlled.  Continue current regimen. Followup in 6 months.  Hyperlipidemia - he reports he is taking his statin regularly. Continue current regimen. Followup in 6 months.  Hypogonadism-he's happy with his current regimen. He would like refills. He is due for PSA, testosterone test, and liver enzymes. It has been a 6 months. He says he will go to the lab on Monday. Refills sent to pharmacy.

## 2012-08-25 ENCOUNTER — Other Ambulatory Visit: Payer: Self-pay | Admitting: Family Medicine

## 2012-09-01 LAB — LIPID PANEL
Cholesterol: 197 mg/dL (ref 0–200)
HDL: 48 mg/dL (ref >39)
Total CHOL/HDL Ratio: 4.1 Ratio
Triglycerides: 127 mg/dL (ref <150)
VLDL: 25 mg/dL (ref 0–40)

## 2012-09-01 LAB — COMPLETE METABOLIC PANEL WITH GFR
AST: 19 U/L (ref 0–37)
Albumin: 4.4 g/dL (ref 3.5–5.2)
Alkaline Phosphatase: 102 U/L (ref 39–117)
BUN: 23 mg/dL (ref 6–23)
Potassium: 5.2 mEq/L (ref 3.5–5.3)
Total Bilirubin: 1.4 mg/dL — ABNORMAL HIGH (ref 0.3–1.2)

## 2012-09-01 LAB — PSA, TOTAL AND FREE: PSA, Free: 0.36 ng/mL

## 2012-09-04 ENCOUNTER — Encounter: Payer: Self-pay | Admitting: Family Medicine

## 2012-09-04 ENCOUNTER — Other Ambulatory Visit: Payer: Self-pay | Admitting: Family Medicine

## 2012-09-04 ENCOUNTER — Ambulatory Visit (INDEPENDENT_AMBULATORY_CARE_PROVIDER_SITE_OTHER): Payer: BC Managed Care – PPO

## 2012-09-04 ENCOUNTER — Ambulatory Visit (INDEPENDENT_AMBULATORY_CARE_PROVIDER_SITE_OTHER): Payer: BC Managed Care – PPO | Admitting: Family Medicine

## 2012-09-04 VITALS — BP 118/74 | HR 73 | Temp 98.2°F | Wt 171.0 lb

## 2012-09-04 DIAGNOSIS — G4700 Insomnia, unspecified: Secondary | ICD-10-CM

## 2012-09-04 DIAGNOSIS — R05 Cough: Secondary | ICD-10-CM

## 2012-09-04 DIAGNOSIS — J189 Pneumonia, unspecified organism: Secondary | ICD-10-CM

## 2012-09-04 DIAGNOSIS — R059 Cough, unspecified: Secondary | ICD-10-CM

## 2012-09-04 DIAGNOSIS — R0989 Other specified symptoms and signs involving the circulatory and respiratory systems: Secondary | ICD-10-CM

## 2012-09-04 DIAGNOSIS — R509 Fever, unspecified: Secondary | ICD-10-CM

## 2012-09-04 LAB — TESTOSTERONE, FREE, TOTAL, SHBG
Sex Hormone Binding: 34 nmol/L (ref 13–71)
Testosterone, Free: 39.6 pg/mL — ABNORMAL LOW (ref 47.0–244.0)

## 2012-09-04 MED ORDER — ALBUTEROL SULFATE HFA 108 (90 BASE) MCG/ACT IN AERS: 2.0000 | INHALATION_SPRAY | Freq: Four times a day (QID) | RESPIRATORY_TRACT | Status: DC

## 2012-09-04 MED ORDER — ZOLPIDEM TARTRATE 5 MG PO TABS: 5.0000 mg | ORAL_TABLET | Freq: Every evening | ORAL | Status: DC | PRN

## 2012-09-04 MED ORDER — PREDNISONE 20 MG PO TABS: 40.0000 mg | ORAL_TABLET | Freq: Every day | ORAL | Status: DC

## 2012-09-04 NOTE — Progress Notes (Signed)
  Subjective:    Patient ID: Victor Price, male    DOB: [birth year], 62 y.o.   MRN: [id number]  HPI Seen 3 weeks ago for possible pneumonia.  Completed the levaquin and then took an old rx for full course of doxycycline. Finished it yesterday adn still doesn't feel well. Still with cough. No fever  Still feels SOB. Sputum is still green.    Insomnia - No sleeping well. Says his sleep med is not working well. Some mild sinus pain and pressure for 2 days, behind both eyes. Has been using temazepam but says he feels like it's still not working well. He's waking up multiple times at night. He says it's not necessarily from a cough that sometimes it is.   Review of Systems     Objective:   Physical Exam  Constitutional: He is oriented to person, place, and time. He appears well-developed and well-nourished.  HENT:  Head: Normocephalic and atraumatic.  Right Ear: External ear normal.  Left Ear: External ear normal.  Nose: Nose normal.  Mouth/Throat: Oropharynx is clear and moist.       TMs and canals are clear.   Eyes: Conjunctivae normal and EOM are normal. Pupils are equal, round, and reactive to light.  Neck: Neck supple. No thyromegaly present.  Cardiovascular: Normal rate and normal heart sounds.   Pulmonary/Chest: Effort normal and breath sounds normal.  Lymphadenopathy:    He has no cervical adenopathy.  Neurological: He is alert and oriented to person, place, and time.  Skin: Skin is warm and dry.  Psychiatric: He has a normal mood and affect.          Assessment & Plan:  Pneumonia - he does have a history of recurrent pneumonia so I saw him 3 weeks ago presumptively treated him for it. He had classic signs and symptoms. Normally he responds very well to Levaquin. This time he did not and has even completed a full course of doxycycline since then. He still only mildly better. Cough is still productive of green sputum. I'll send him for chest x-ray today. Will call back.  Consider treating with slightly different coverage. Also encouraged him to use his inhaler regularly while he was sick. He does need a new and sent to pharmacy. He says he is only really used it a few times , as he says he forgets to use it.  Insomnia - he did we discussed different options. We'll stop the temazepam and he can try Ambien. He never used to before. I wrote for low dose, 5 mg. Call if not helping her working. Reminded him again that can cause excessive sedation and to monitor for this. Make sure has adequate time for sleep before taking the medication.

## 2012-09-18 ENCOUNTER — Other Ambulatory Visit: Payer: Self-pay | Admitting: Family Medicine

## 2012-09-22 ENCOUNTER — Encounter: Payer: Self-pay | Admitting: *Deleted

## 2012-09-28 ENCOUNTER — Other Ambulatory Visit: Payer: Self-pay | Admitting: *Deleted

## 2012-10-04 ENCOUNTER — Telehealth: Payer: Self-pay | Admitting: *Deleted

## 2012-10-04 MED ORDER — ZOLPIDEM TARTRATE 10 MG PO TABS: 10.0000 mg | ORAL_TABLET | Freq: Every evening | ORAL | Status: DC | PRN

## 2012-10-04 NOTE — Telephone Encounter (Signed)
Ok to increase to 10 mg

## 2012-10-04 NOTE — Telephone Encounter (Signed)
Pt calling because the last Rx for Ambien was not effective. Looking back @ last OV note this was discussed and pt would be started off with a low dose of Ambien 5 mg and if this did not work he was to call back. I asked pt what time he goes to bed he stated that he goes to bed at 930 however he wakes up @ 1 AM due to not being able to stay asleep. I told him that I would forward this to Dr. Linford Arnold to get her advice and contact him with her advice. Pt voiced understanding and agreed.Loralee Pacas [place]

## 2012-10-13 ENCOUNTER — Ambulatory Visit: Payer: BC Managed Care – PPO | Admitting: Family Medicine

## 2012-10-17 ENCOUNTER — Ambulatory Visit (INDEPENDENT_AMBULATORY_CARE_PROVIDER_SITE_OTHER): Payer: BC Managed Care – PPO | Admitting: Family Medicine

## 2012-10-17 ENCOUNTER — Encounter: Payer: Self-pay | Admitting: Family Medicine

## 2012-10-17 VITALS — BP 140/76 | HR 91 | Ht 72.0 in | Wt 166.0 lb

## 2012-10-17 DIAGNOSIS — R634 Abnormal weight loss: Secondary | ICD-10-CM

## 2012-10-17 DIAGNOSIS — G4700 Insomnia, unspecified: Secondary | ICD-10-CM

## 2012-10-17 MED ORDER — ZOLPIDEM TARTRATE ER 12.5 MG PO TBCR: 12.5000 mg | EXTENDED_RELEASE_TABLET | Freq: Every evening | ORAL | Status: DC | PRN

## 2012-10-17 NOTE — Progress Notes (Signed)
  Subjective:    Patient ID: Victor Price, male    DOB: [birth year], 62 y.o.   MRN: [id number]  HPI Not sleeping well for 2 months. Says goes to bed around 10-11PM and then wakes up around 3AM.  So only getting about 4-5 hours on Ambien 10mg .  Then he is exhausted by 1PM in the afternoon.  Did try 2 tabs and that worked well.  Caffeine limited to the AM.  No napping. No regular exercise, but some.  No major stress at work.  Not using TV fall asleep. He doesn't snore.   Abnormal weight loss. No change in appetite.  No abdominal pain.  No SOB recently.  No nausea.  No blood in stool or urine.  No nightsweats or fever. No cough. No fevers. He has felt more fatigued. He is on methimazole per his endocrinologist for his thyroid. He's not sure when his next followup is but says he does followup regularly.   Review of Systems     Objective:   Physical Exam  Constitutional: He is oriented to person, place, and time. He appears well-developed and well-nourished.  HENT:  Head: Normocephalic and atraumatic.  Cardiovascular: Normal rate, regular rhythm and normal heart sounds.   Radial pulses 2+ bilaterally.   Pulmonary/Chest: Effort normal and breath sounds normal.  Neurological: He is alert and oriented to person, place, and time.  Skin: Skin is warm and dry.  Psychiatric: He has a normal mood and affect. His behavior is normal.          Assessment & Plan:  Insomnia - discussed options.  Will inc ambien to the CR 12.5mg .  overall he seems to have good sleep hygiene. He doesn't take naps today, he limits his caffeine to the morning, he does not use TV to fall asleep, he has a consistent bedtime. It sounds acute distress actually wind down and relax before bedtime which is also helpful. I do think the Ambien is working he is able to fall asleep but then wakes up after 4-5 hours. Thus we'll try extended-release product to see if this helps get him an extra hour to hour and half. Strongly enocuraged  him to NEVER take 2 tabs of Ambien.   Abnormal weight loss - I am concerned about his recent unintentional weight loss especially with a normal appetite, no abnormal pain. No blood in the urine or stool. No recent breathing issues. But he does have some chronic underlying lung problems. The he says it does not limit any of his activities. I offered to go ahead and start a workup for his weight loss today but he wants to hold off and give this one more month. I encouraged him to keep an eye on his weight over the next month if he continues to lose I would like to do further workup with chest x-ray, CBC, urinalysis, stool cards, sedimentation rate, CMP, etc. this could be related to his thyroid as well.

## 2012-10-17 NOTE — Patient Instructions (Addendum)
Call me in one month if you are still losing weight.

## 2012-11-06 ENCOUNTER — Encounter: Payer: Self-pay | Admitting: Family Medicine

## 2012-11-06 ENCOUNTER — Ambulatory Visit (INDEPENDENT_AMBULATORY_CARE_PROVIDER_SITE_OTHER): Payer: BC Managed Care – PPO | Admitting: Family Medicine

## 2012-11-06 VITALS — BP 123/80 | HR 82 | Wt 167.0 lb

## 2012-11-06 DIAGNOSIS — R634 Abnormal weight loss: Secondary | ICD-10-CM

## 2012-11-06 DIAGNOSIS — G4700 Insomnia, unspecified: Secondary | ICD-10-CM

## 2012-11-06 MED ORDER — ESZOPICLONE 3 MG PO TABS: 3.0000 mg | ORAL_TABLET | Freq: Every day | ORAL | Status: DC

## 2012-11-06 NOTE — Progress Notes (Signed)
  Subjective:    Patient ID: Victor Price, male    DOB: [birth year], 62 y.o.   MRN: [id number]  HPI Insomnia - I saw him last he was getting some response from regular Ambien but was still only getting about 4-5 hours of sleep so we tried Ambien CR.  He has been on it for the last 2 weeks, Ambien CR is not helping. Says has been taking 2 hours to fall asleep some nights. Was getting 4-5 hours at hours at night on regular Ambien.  Has some cold symptoms. No fever.   His wife is with him today. Review of Systems     Objective:   Physical Exam  Constitutional: He is oriented to person, place, and time. He appears well-developed and well-nourished.  HENT:  Head: Normocephalic and atraumatic.  Cardiovascular: Normal rate, regular rhythm and normal heart sounds.   Pulmonary/Chest: Effort normal and breath sounds normal.  Neurological: He is alert and oriented to person, place, and time.  Skin: Skin is warm and dry.  Psychiatric: He has a normal mood and affect. His behavior is normal.          Assessment & Plan:  Insomnia - Will try lunesta.  Only minimal improvement on Ambien Cr. If not helping then consider off label use of seroquel.  We thoroughly reviewed sleep hygiene I last saw him and overall he does have good sleep hygiene habits.  Weight Loss - Weight is stable today. We will continue to keep an eye on this.  URI - Call if getting worse or not ebetter after one week.

## 2012-11-14 ENCOUNTER — Telehealth: Payer: Self-pay | Admitting: *Deleted

## 2012-11-14 NOTE — Telephone Encounter (Signed)
Pt lvm stating that he is only sleeping 2-3 hours at night and would like for something to be called into his pharmacy. Laureen Ochs, Viann Shove

## 2012-11-14 NOTE — Telephone Encounter (Signed)
Routed to [facility]

## 2012-11-14 NOTE — Telephone Encounter (Signed)
Requires office visit with one of [name] due to Dr. Jacquelynn Cree absence.

## 2012-11-15 NOTE — Telephone Encounter (Signed)
Left message on vm

## 2012-11-22 ENCOUNTER — Telehealth: Payer: Self-pay | Admitting: Family Medicine

## 2012-11-22 MED ORDER — QUETIAPINE FUMARATE 50 MG PO TABS: 25.0000 mg | ORAL_TABLET | Freq: Every day | ORAL | Status: DC

## 2012-11-22 NOTE — Telephone Encounter (Signed)
Called pt and told him that Dr. Linford Arnold called in seroquel for him to take qhs and the instructions and for him to f/u with her pt voiced understanding and agreed.Loralee Pacas [place]

## 2012-11-22 NOTE — Telephone Encounter (Signed)
Call pt: I will send over seroqeul for him to take at bedtime Start with half and then go to whole if needed.  Try for at least 2 weeks nightly and then f/u with me.

## 2013-01-29 ENCOUNTER — Ambulatory Visit (INDEPENDENT_AMBULATORY_CARE_PROVIDER_SITE_OTHER): Payer: BC Managed Care – PPO | Admitting: Physician Assistant

## 2013-01-29 ENCOUNTER — Encounter: Payer: Self-pay | Admitting: Physician Assistant

## 2013-01-29 VITALS — BP 140/86 | HR 76 | Wt 173.0 lb

## 2013-01-29 DIAGNOSIS — IMO0002 Reserved for concepts with insufficient information to code with codable children: Secondary | ICD-10-CM

## 2013-01-29 DIAGNOSIS — G629 Polyneuropathy, unspecified: Secondary | ICD-10-CM

## 2013-01-29 DIAGNOSIS — IMO0001 Reserved for inherently not codable concepts without codable children: Secondary | ICD-10-CM

## 2013-01-29 DIAGNOSIS — L989 Disorder of the skin and subcutaneous tissue, unspecified: Secondary | ICD-10-CM

## 2013-01-29 MED ORDER — MUPIROCIN 2 % EX OINT: TOPICAL_OINTMENT | Freq: Three times a day (TID) | CUTANEOUS | Status: DC
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
## 2013-01-29 NOTE — Patient Instructions (Addendum)
Stasis Ulcer °Stasis ulcers occur in the legs when the circulation is damaged. An ulcer may look like a small hole in the skin.  °CAUSES °Stasis ulcers occur because your veins do not work properly. Veins have valves that help the blood return to the heart. If these valves do not work right, blood flows backwards and backs up into the veins near the skin. This condition causes the veins to become larger because of increased pressure and may lead to a stasis ulcer. °SYMPTOMS  °· Shallow (superficial) sore on the leg. °· Clear drainage or weeping from the sore. °· Leg pain or a feeling of heaviness. This may be worse at the end of the day. °· Leg swelling. °· Skin color changes. °DIAGNOSIS  °Your caregiver will make a diagnosis by examining your leg. Your caregiver may order tests such as an ultrasound or other studies to evaluate the blood flow of the leg. °HOME CARE INSTRUCTIONS  °· Do not stand or sit in one position for long periods of time. Do not sit with your legs crossed. Rest with your legs raised during the day. If possible, it is best if you can elevate your legs above your heart for 30 minutes, 3 to 4 times a day. °· Wear elastic stockings or support hose. Do not wear other tight encircling garments around legs, pelvis, or waist. This causes increased pressure in your veins. If your caregiver has applied compressive medicated wraps, use them as instructed. °· Walk as much as possible to increase blood flow. If you are taking long rides in a car or plane, take a break to walk around every 2 hours. If not already on aspirin, take a baby aspirin before long trips unless you have medical reasons that prohibit this. °· Raise the foot of your bed at night with 2-inch blocks if approved by your caregiver. This may not be desirable if you have heart failure or breathing problems. °· If you get a cut in the skin over the vein and the vein bleeds, lie down with your leg raised and gently clean the area with a clean  cloth. Apply pressure on the cut until the bleeding stops. Then place a dressing on the cut. See your caregiver if it continues to bleed or needs stitches. Also, see your caregiver if you develop an infection. Signs of an infection include a fever, redness, increased pain, and drainage of pus. °· If your caregiver has given you a follow-up appointment, it is very important to keep that appointment. Not keeping the appointment could result in a chronic or permanent injury, pain, and disability. If there is any problem keeping the appointment, call your caregiver for assistance. °SEEK IMMEDIATE MEDICAL CARE IF: °· The ulcer area starts to break down. °· You have pain, redness, tenderness, pus, or hard swelling in your leg over a vein or near the ulcer. °· Your leg pain is uncomfortable. °· You develop an unexplained fever. °· You develop chest pain or shortness of breath. °Document Released: 04/20/2001 Document Revised: 10/18/2011 Document Reviewed: 11/15/2010 °[employer]® Patient Information ©2014 [employer]. ° °

## 2013-01-30 ENCOUNTER — Ambulatory Visit: Payer: BC Managed Care – PPO | Admitting: Family Medicine

## 2013-02-27 ENCOUNTER — Other Ambulatory Visit: Payer: Self-pay | Admitting: Family Medicine

## 2013-03-05 ENCOUNTER — Other Ambulatory Visit: Payer: Self-pay | Admitting: Family Medicine

## 2013-03-05 DIAGNOSIS — E291 Testicular hypofunction: Secondary | ICD-10-CM

## 2013-03-05 NOTE — Telephone Encounter (Signed)
Call pt: Needs labs for testosterone and psa

## 2013-03-29 ENCOUNTER — Other Ambulatory Visit: Payer: Self-pay | Admitting: Family Medicine

## 2013-04-02 ENCOUNTER — Ambulatory Visit (INDEPENDENT_AMBULATORY_CARE_PROVIDER_SITE_OTHER): Payer: BC Managed Care – PPO | Admitting: Family Medicine

## 2013-04-02 ENCOUNTER — Ambulatory Visit (INDEPENDENT_AMBULATORY_CARE_PROVIDER_SITE_OTHER): Payer: BC Managed Care – PPO

## 2013-04-02 ENCOUNTER — Encounter: Payer: Self-pay | Admitting: Family Medicine

## 2013-04-02 VITALS — BP 136/79 | HR 88 | Wt 178.0 lb

## 2013-04-02 DIAGNOSIS — M25562 Pain in left knee: Secondary | ICD-10-CM

## 2013-04-02 DIAGNOSIS — M25569 Pain in unspecified knee: Secondary | ICD-10-CM

## 2013-04-02 DIAGNOSIS — G609 Hereditary and idiopathic neuropathy, unspecified: Secondary | ICD-10-CM

## 2013-04-02 DIAGNOSIS — G629 Polyneuropathy, unspecified: Secondary | ICD-10-CM

## 2013-04-02 DIAGNOSIS — Z23 Encounter for immunization: Secondary | ICD-10-CM

## 2013-04-02 NOTE — Progress Notes (Signed)
  Subjective:    Patient ID: Victor Price, male    DOB: 1951-05-03, 62 y.o.   MRN: [id number]  HPI Left knee pain x 1 month.  Popping, cracking and has been locking. No trauma or injury. No having a lot of pain as he has neuropathy. Has been wearing a knee brace for support.  No swelling of the knee. He says he tends to notice it more if he pivots on that foot. He denies any old trauma. He does have neuropathy.   Review of Systems     Objective:   Physical Exam  Constitutional: He appears well-developed and well-nourished.  Musculoskeletal:  Left knee with normal flexion, extension. No extra laxity of the joint.  nontender over the joint.  Strength is 5/5. Patellar reflexes 2+ bilat.   Skin: Skin is warm and dry.  Psychiatric: He has a normal mood and affect. His behavior is normal.          Assessment & Plan:  Left knee pain - will get xray today. Because of his neuropathy it makes a little bit more difficult to diagnose knee pain it is not really able to localize any discomfort. It's more just a cracking or popping and locking.  We'll get an x-ray today to evaluate for any abnormalities. His patella is actually riding a lot higher on the left compared to his right. Unsure if this is old or new. He says he's never really noticed it before. Continue wearing a brace since it is supportive and seems to help his symptoms. I did not recommending any anti-inflammatories at this point in time it is not really having any pain.

## 2013-04-05 ENCOUNTER — Ambulatory Visit (INDEPENDENT_AMBULATORY_CARE_PROVIDER_SITE_OTHER): Payer: BC Managed Care – PPO | Admitting: Sports Medicine

## 2013-04-05 ENCOUNTER — Encounter: Payer: Self-pay | Admitting: Sports Medicine

## 2013-04-05 VITALS — BP 134/87 | HR 63 | Wt 178.0 lb

## 2013-04-05 DIAGNOSIS — M7632 Iliotibial band syndrome, left leg: Secondary | ICD-10-CM | POA: Insufficient documentation

## 2013-04-05 DIAGNOSIS — M2420 Disorder of ligament, unspecified site: Secondary | ICD-10-CM

## 2013-04-05 DIAGNOSIS — M629 Disorder of muscle, unspecified: Secondary | ICD-10-CM

## 2013-04-05 NOTE — Assessment & Plan Note (Signed)
Victor Price is experiencing a benign tendinous pop. He has no pain, no swelling, no buckling. I think his symptoms are benign, the fact that he can get them to go away with strapping on a knee brace very tight suggests that the issue is not inside the joint. He should continue his knee brace, and some IT band stretching exercises, he has no restrictions, and he can return to see me on an as-needed basis.

## 2013-04-05 NOTE — Progress Notes (Signed)
   Subjective:    I'm seeing this patient as a consultation for:  Dr. Linford Arnold  CC: Left knee popping  HPI: This is a very healthy and pleasant 62 year old male who comes in with a several week history of a vague popping sensation in his left knee. He denies any trauma, but pop is not painful, it occurs predominately when twisting, he denies any swelling, any other mechanical symptoms, no constitutional symptoms. He has never had problems with his knee before. He runs for exercise and can do this completely without pain. Symptoms are mild, persistent. He was referred to me for further evaluation and definitive treatment.  Past medical history, Surgical history, Family history not pertinant except as noted below, Social history, Allergies, and medications have been entered into the medical record, reviewed, and no changes needed.   Review of Systems: No headache, visual changes, nausea, vomiting, diarrhea, constipation, dizziness, abdominal pain, skin rash, fevers, chills, night sweats, weight loss, swollen lymph nodes, body aches, joint swelling, muscle aches, chest pain, shortness of breath, mood changes, visual or auditory hallucinations.   Objective:   General: Well Developed, well nourished, and in no acute distress.  Neuro/Psych: Alert and oriented x3, extra-ocular muscles intact, able to move all 4 extremities, sensation grossly intact. Skin: Warm and dry, no rashes noted.  Respiratory: Not using accessory muscles, speaking in full sentences, trachea midline.  Cardiovascular: Pulses palpable, no extremity edema. Abdomen: Does not appear distended. Left Knee: He has excellent sensation to my palpation of his knee joint. Normal to inspection with no erythema or effusion or obvious bony abnormalities. Palpation normal with no warmth, joint line tenderness, patellar tenderness, or condyle tenderness. ROM full in flexion and extension and lower leg rotation. Ligaments with solid consistent  endpoints including ACL, PCL, LCL, MCL. Negative Mcmurray's, Apley's, and Thessalonian tests. Non painful patellar compression. Patellar glide without crepitus. Patellar and quadriceps tendons unremarkable. Hamstring and quadriceps strength is normal.   X-rays were reviewed, there is no degenerative change, there are some enthesiopathic changes at the distal quadriceps but otherwise negative.  Impression and Recommendations:   This case required medical decision making of moderate complexity.

## 2013-08-27 ENCOUNTER — Telehealth: Payer: Self-pay | Admitting: *Deleted

## 2013-08-27 ENCOUNTER — Ambulatory Visit (INDEPENDENT_AMBULATORY_CARE_PROVIDER_SITE_OTHER): Payer: BC Managed Care – PPO | Admitting: Family Medicine

## 2013-08-27 ENCOUNTER — Encounter: Payer: Self-pay | Admitting: Family Medicine

## 2013-08-27 VITALS — BP 147/95 | HR 96 | Temp 98.5°F | Wt 182.0 lb

## 2013-08-27 DIAGNOSIS — J189 Pneumonia, unspecified organism: Secondary | ICD-10-CM

## 2013-08-27 DIAGNOSIS — E785 Hyperlipidemia, unspecified: Secondary | ICD-10-CM

## 2013-08-27 MED ORDER — LEVOFLOXACIN 750 MG PO TABS: 750.0000 mg | ORAL_TABLET | Freq: Every day | ORAL | Status: AC

## 2013-08-27 MED ORDER — ROSUVASTATIN CALCIUM 10 MG PO TABS: 10.0000 mg | ORAL_TABLET | Freq: Every day | ORAL | Status: DC

## 2013-08-27 NOTE — Progress Notes (Signed)
CC: Victor Price is a 63 y.o. male is here for Pneumonia   Subjective: HPI:  Complains of productive cough that has been worsening over the past 3 weeks currently moderate in severity described as producing thick green sputum without blood. Accompanied by subjective fever last week occasional chills. Accompanied by fatigue moderate in severity. Denies shortness of breath, wheezing, chest pain, back pain, confusion, nausea, vomiting, racing heart, abdominal pain, nor myalgias/body aches  Requesting refill on Crestor. Last cholesterol panel was done last year he denies right upper quadrant pain nor myalgias   Review Of Systems Outlined In HPI  Past Medical History  Diagnosis Date  . Pneumonia     gets recurrent PNA in left lung     History reviewed. No pertinent family history.   History  Substance Use Topics  . Smoking status: Never Smoker   . Smokeless tobacco: Not on file  . Alcohol Use: No     Objective: Filed Vitals:   08/27/13 1335  BP: 147/95  Pulse: 96  Temp: 98.5 F (36.9 C)    General: Alert and Oriented, No Acute Distress HEENT: Pupils equal, round, reactive to light. Conjunctivae clear.  External ears unremarkable, canals clear with intact TMs with appropriate landmarks.  Middle ear appears open without effusion. Pink inferior turbinates.  Moist mucous membranes, pharynx without inflammation nor lesions.  Neck supple without palpable lymphadenopathy nor abnormal masses. Lungs: Comfortable work of breathing with moderate rhonchi in all lung fields no wheezing, rales or signs of consolidation Cardiac: Regular rate and rhythm. Normal S1/S2.  No murmurs, rubs, nor gallops.   Mental Status: No depression, anxiety, nor agitation. Skin: Warm and dry.  Assessment & Plan: Victor Price was seen today for pneumonia.  Diagnoses and associated orders for this visit:  CAP (community acquired pneumonia) - levofloxacin (LEVAQUIN) 750 MG tablet; Take 1 tablet (750 mg total) by  mouth daily.  Hyperlipidemia - rosuvastatin (CRESTOR) 10 MG tablet; Take 1 tablet (10 mg total) by mouth daily.    Community acquired pneumonia start levofloxacin he politely declines chest x-ray today.Signs and symptoms requring emergent/urgent reevaluation were discussed with the patient. Mucinex or generic equivalent to-3 times a day Hyperlipidemia: Refills provided of Crestor with instructions to return within the next 3 months for fasting lipid panel with PCP  Return in about 3 months (around 11/25/2013) for Cholesterol.

## 2013-08-27 NOTE — Telephone Encounter (Signed)
error 

## 2014-01-04 ENCOUNTER — Telehealth: Payer: Self-pay | Admitting: *Deleted

## 2014-01-04 MED ORDER — BISOPROLOL-HYDROCHLOROTHIAZIDE 2.5-6.25 MG PO TABS: 1.0000 | ORAL_TABLET | Freq: Every day | ORAL | Status: DC

## 2014-01-04 NOTE — Telephone Encounter (Signed)
Pt informed that he will need an appt. He informed me that he does not have insurance at present. I told him that I would advise Dr. Madilyn Fireman and that we would try to see what can be done so that he doesn't neglect his health care. Pt voiced understanding.Victor Price

## 2014-01-07 NOTE — Telephone Encounter (Signed)
Pt informed.Victor Price [name]  

## 2014-01-07 NOTE — Telephone Encounter (Signed)
[place] for 90 days of refills but needs to try to get in this summer if at all possible.

## 2014-04-30 ENCOUNTER — Encounter: Payer: Self-pay | Admitting: Family Medicine

## 2014-04-30 ENCOUNTER — Ambulatory Visit (INDEPENDENT_AMBULATORY_CARE_PROVIDER_SITE_OTHER): Payer: No Typology Code available for payment source | Admitting: Family Medicine

## 2014-04-30 VITALS — BP 148/95 | HR 79 | Temp 98.3°F | Ht 72.0 in | Wt 185.0 lb

## 2014-04-30 DIAGNOSIS — Z125 Encounter for screening for malignant neoplasm of prostate: Secondary | ICD-10-CM

## 2014-04-30 DIAGNOSIS — I10 Essential (primary) hypertension: Secondary | ICD-10-CM

## 2014-04-30 DIAGNOSIS — E785 Hyperlipidemia, unspecified: Secondary | ICD-10-CM

## 2014-04-30 DIAGNOSIS — G4700 Insomnia, unspecified: Secondary | ICD-10-CM

## 2014-04-30 MED ORDER — BISOPROLOL-HYDROCHLOROTHIAZIDE 5-6.25 MG PO TABS: 1.0000 | ORAL_TABLET | Freq: Every day | ORAL | Status: DC

## 2014-04-30 MED ORDER — ROSUVASTATIN CALCIUM 10 MG PO TABS: 10.0000 mg | ORAL_TABLET | Freq: Every day | ORAL | Status: DC

## 2014-04-30 MED ORDER — LANSOPRAZOLE 30 MG PO CPDR: 30.0000 mg | DELAYED_RELEASE_CAPSULE | Freq: Every day | ORAL | Status: DC

## 2014-04-30 MED ORDER — ESZOPICLONE 3 MG PO TABS: 3.0000 mg | ORAL_TABLET | Freq: Every day | ORAL | Status: DC

## 2014-04-30 NOTE — Assessment & Plan Note (Signed)
Due for CMP and lipid.  Can refill meds x 1 yr.

## 2014-04-30 NOTE — Assessment & Plan Note (Signed)
Doing well on current, chronic regimen without any side effects. Refill sent to pharmacy today. Followup in 6 months.

## 2014-04-30 NOTE — Progress Notes (Signed)
   Subjective:    Patient ID: Victor Price, male    DOB: 06/06/1951, 63 y.o.   MRN: [id number]  Hypertension   Hypertension- Pt denies chest pain, SOB, dizziness, or heart palpitations.  Taking meds as directed w/o problems. The he actually ran out about a week ago. But reports of home blood pressures have been in the 140s as well. Denies medication side effects.  Last seen in Jan adn BP was high then.    Hyperlipidemia - Due to recheck lipoid levels.  Denies any myalgias or side effects. Lab Results  Component Value Date   LDLCALC 124* 08/17/2012   Insomnia-doing well with his current medication. Would like refills today. Denies any excess sedation and grogginess.  Review of Systems     Objective:   Physical Exam  Constitutional: He is oriented to person, place, and time. He appears well-developed and well-nourished.  HENT:  Head: Normocephalic and atraumatic.  Cardiovascular: Normal rate, regular rhythm and normal heart sounds.   Pulmonary/Chest: Effort normal and breath sounds normal.  Neurological: He is alert and oriented to person, place, and time.  Skin: Skin is warm and dry.  Psychiatric: He has a normal mood and affect. His behavior is normal.          Assessment & Plan:  Flu vaccine given today.

## 2014-04-30 NOTE — Assessment & Plan Note (Signed)
Uncontrolled. Will inc bisoprolol and have him f/U in 4-6 weeks.  Due for CMP.

## 2014-05-02 ENCOUNTER — Telehealth: Payer: Self-pay

## 2014-05-02 NOTE — Telephone Encounter (Signed)
Victor Price states the Victor Price is not helping him sleep throughout the night. He is only getting a few hours of sleep.   Victor Price reports his last colonoscopy was in 2012.

## 2014-05-02 NOTE — Telephone Encounter (Signed)
We could try a newer medication like Belsomra?He is already on highest dose of lunesta

## 2014-05-03 MED ORDER — SUVOREXANT 10 MG PO TABS: 1.0000 | ORAL_TABLET | Freq: Every day | ORAL | Status: DC

## 2014-05-03 NOTE — Telephone Encounter (Signed)
rx sent

## 2014-05-03 NOTE — Telephone Encounter (Signed)
Patient would like the new insomnia medication sent to [facility].

## 2014-05-24 ENCOUNTER — Other Ambulatory Visit: Payer: Self-pay

## 2014-07-29 ENCOUNTER — Other Ambulatory Visit: Payer: Self-pay | Admitting: Family Medicine

## 2014-08-05 ENCOUNTER — Other Ambulatory Visit: Payer: Self-pay | Admitting: Family Medicine

## 2014-08-14 ENCOUNTER — Encounter: Payer: Self-pay | Admitting: Family Medicine

## 2014-08-14 DIAGNOSIS — N182 Chronic kidney disease, stage 2 (mild): Secondary | ICD-10-CM | POA: Insufficient documentation

## 2014-08-14 LAB — LIPID PANEL
CHOL/HDL RATIO: 3.8 ratio
CHOLESTEROL: 195 mg/dL (ref 0–200)
HDL: 52 mg/dL (ref >39)
LDL Cholesterol: 113 mg/dL — ABNORMAL HIGH (ref 0–99)
Triglycerides: 152 mg/dL — ABNORMAL HIGH (ref <150)
VLDL: 30 mg/dL (ref 0–40)

## 2014-08-14 LAB — COMPLETE METABOLIC PANEL WITH GFR
ALT: 40 U/L (ref 0–53)
AST: 21 U/L (ref 0–37)
Albumin: 4.3 g/dL (ref 3.5–5.2)
Alkaline Phosphatase: 87 U/L (ref 39–117)
BUN: 20 mg/dL (ref 6–23)
CHLORIDE: 101 meq/L (ref 96–112)
CO2: 28 mEq/L (ref 19–32)
CREATININE: 1.4 mg/dL — AB (ref 0.50–1.35)
Calcium: 10.2 mg/dL (ref 8.4–10.5)
GFR, EST AFRICAN AMERICAN: 62 mL/min
GFR, Est Non African American: 54 mL/min — ABNORMAL LOW
Glucose, Bld: 83 mg/dL (ref 70–99)
POTASSIUM: 4.6 meq/L (ref 3.5–5.3)
Sodium: 139 mEq/L (ref 135–145)
TOTAL PROTEIN: 7.3 g/dL (ref 6.0–8.3)
Total Bilirubin: 0.9 mg/dL (ref 0.2–1.2)

## 2014-08-14 LAB — TSH: TSH: 0.534 u[IU]/mL (ref 0.350–4.500)

## 2014-08-14 LAB — PSA: PSA: 1.82 ng/mL (ref <=4.00)

## 2014-08-15 ENCOUNTER — Other Ambulatory Visit: Payer: Self-pay | Admitting: *Deleted

## 2014-08-15 DIAGNOSIS — R7989 Other specified abnormal findings of blood chemistry: Secondary | ICD-10-CM

## 2014-08-27 ENCOUNTER — Encounter: Payer: Self-pay | Admitting: Physician Assistant

## 2014-08-27 ENCOUNTER — Ambulatory Visit (INDEPENDENT_AMBULATORY_CARE_PROVIDER_SITE_OTHER): Payer: Worker's Compensation

## 2014-08-27 ENCOUNTER — Ambulatory Visit (INDEPENDENT_AMBULATORY_CARE_PROVIDER_SITE_OTHER): Payer: No Typology Code available for payment source | Admitting: Physician Assistant

## 2014-08-27 DIAGNOSIS — R030 Elevated blood-pressure reading, without diagnosis of hypertension: Secondary | ICD-10-CM | POA: Diagnosis not present

## 2014-08-27 DIAGNOSIS — R202 Paresthesia of skin: Secondary | ICD-10-CM | POA: Diagnosis not present

## 2014-08-27 DIAGNOSIS — M542 Cervicalgia: Secondary | ICD-10-CM | POA: Diagnosis not present

## 2014-08-27 DIAGNOSIS — R200 Anesthesia of skin: Secondary | ICD-10-CM

## 2014-08-27 DIAGNOSIS — M5412 Radiculopathy, cervical region: Secondary | ICD-10-CM | POA: Diagnosis not present

## 2014-08-27 DIAGNOSIS — M5030 Other cervical disc degeneration, unspecified cervical region: Secondary | ICD-10-CM

## 2014-08-27 DIAGNOSIS — IMO0001 Reserved for inherently not codable concepts without codable children: Secondary | ICD-10-CM

## 2014-08-27 DIAGNOSIS — M25511 Pain in right shoulder: Secondary | ICD-10-CM | POA: Diagnosis not present

## 2014-08-27 MED ORDER — HYDROCODONE-ACETAMINOPHEN 7.5-325 MG PO TABS: 1.0000 | ORAL_TABLET | Freq: Three times a day (TID) | ORAL | Status: DC | PRN

## 2014-08-27 MED ORDER — PREDNISONE (PAK) 10 MG PO TABS: ORAL_TABLET | ORAL | Status: DC

## 2014-08-27 MED ORDER — MELOXICAM 15 MG PO TABS: 15.0000 mg | ORAL_TABLET | Freq: Every day | ORAL | Status: DC

## 2014-08-27 NOTE — Patient Instructions (Signed)
Cervical Strain and Sprain (Whiplash) with Rehab Cervical strain and sprain are injuries that commonly occur with "whiplash" injuries. Whiplash occurs when the neck is forcefully whipped backward or forward, such as during a motor vehicle accident or during contact sports. The muscles, ligaments, tendons, discs, and nerves of the neck are susceptible to injury when this occurs. RISK FACTORS Risk of having a whiplash injury increases if:  Osteoarthritis of the spine.  Situations that make head or neck accidents or trauma more likely.  High-risk sports (football, rugby, wrestling, hockey, auto racing, gymnastics, diving, contact karate, or boxing).  Poor strength and flexibility of the neck.  Previous neck injury.  Poor tackling technique.  Improperly fitted or padded equipment. SYMPTOMS   Pain or stiffness in the front or back of neck or both.  Symptoms may present immediately or up to 24 hours after injury.  Dizziness, headache, nausea, and vomiting.  Muscle spasm with soreness and stiffness in the neck.  Tenderness and swelling at the injury site. PREVENTION  Learn and use proper technique (avoid tackling with the head, spearing, and head-butting; use proper falling techniques to avoid landing on the head).  Warm up and stretch properly before activity.  Maintain physical fitness:  Strength, flexibility, and endurance.  Cardiovascular fitness.  Wear properly fitted and padded protective equipment, such as padded soft collars, for participation in contact sports. PROGNOSIS  Recovery from cervical strain and sprain injuries is dependent on the extent of the injury. These injuries are usually curable in 1 week to 3 months with appropriate treatment.  RELATED COMPLICATIONS   Temporary numbness and weakness may occur if the nerve roots are damaged, and this may persist until the nerve has completely healed.  Chronic pain due to frequent recurrence of  symptoms.  Prolonged healing, especially if activity is resumed too soon (before complete recovery). TREATMENT  Treatment initially involves the use of ice and medication to help reduce pain and inflammation. It is also important to perform strengthening and stretching exercises and modify activities that worsen symptoms so the injury does not get worse. These exercises may be performed at home or with a therapist. For patients who experience severe symptoms, a soft, padded collar may be recommended to be worn around the neck.  Improving your posture may help reduce symptoms. Posture improvement includes pulling your chin and abdomen in while sitting or standing. If you are sitting, sit in a firm chair with your buttocks against the back of the chair. While sleeping, try replacing your pillow with a small towel rolled to 2 inches in diameter, or use a cervical pillow or soft cervical collar. Poor sleeping positions delay healing.  For patients with nerve root damage, which causes numbness or weakness, the use of a cervical traction apparatus may be recommended. Surgery is rarely necessary for these injuries. However, cervical strain and sprains that are present at birth (congenital) may require surgery. MEDICATION   If pain medication is necessary, nonsteroidal anti-inflammatory medications, such as aspirin and ibuprofen, or other minor pain relievers, such as acetaminophen, are often recommended.  Do not take pain medication for 7 days before surgery.  Prescription pain relievers may be given if deemed necessary by your caregiver. Use only as directed and only as much as you need. HEAT AND COLD:   Cold treatment (icing) relieves pain and reduces inflammation. Cold treatment should be applied for 10 to 15 minutes every 2 to 3 hours for inflammation and pain and immediately after any activity that aggravates   your symptoms. Use ice packs or an ice massage.  Heat treatment may be used prior to  performing the stretching and strengthening activities prescribed by your caregiver, physical therapist, or athletic trainer. Use a heat pack or a warm soak. SEEK MEDICAL CARE IF:   Symptoms get worse or do not improve in 2 weeks despite treatment.  New, unexplained symptoms develop (drugs used in treatment may produce side effects). EXERCISES RANGE OF MOTION (ROM) AND STRETCHING EXERCISES - Cervical Strain and Sprain These exercises may help you when beginning to rehabilitate your injury. In order to successfully resolve your symptoms, you must improve your posture. These exercises are designed to help reduce the forward-head and rounded-shoulder posture which contributes to this condition. Your symptoms may resolve with or without further involvement from your physician, physical therapist or athletic trainer. While completing these exercises, remember:   Restoring tissue flexibility helps normal motion to return to the joints. This allows healthier, less painful movement and activity.  An effective stretch should be held for at least 20 seconds, although you may need to begin with shorter hold times for comfort.  A stretch should never be painful. You should only feel a gentle lengthening or release in the stretched tissue. STRETCH- Axial Extensors  Lie on your back on the floor. You may bend your knees for comfort. Place a rolled-up hand towel or dish towel, about 2 inches in diameter, under the part of your head that makes contact with the floor.  Gently tuck your chin, as if trying to make a "double chin," until you feel a gentle stretch at the base of your head.  Hold __________ seconds. Repeat __________ times. Complete this exercise __________ times per day.  STRETCH - Axial Extension   Stand or sit on a firm surface. Assume a good posture: chest up, shoulders drawn back, abdominal muscles slightly tense, knees unlocked (if standing) and feet hip width apart.  Slowly retract your  chin so your head slides back and your chin slightly lowers. Continue to look straight ahead.  You should feel a gentle stretch in the back of your head. Be certain not to feel an aggressive stretch since this can cause headaches later.  Hold for __________ seconds. Repeat __________ times. Complete this exercise __________ times per day. STRETCH - Cervical Side Bend   Stand or sit on a firm surface. Assume a good posture: chest up, shoulders drawn back, abdominal muscles slightly tense, knees unlocked (if standing) and feet hip width apart.  Without letting your nose or shoulders move, slowly tip your right / left ear to your shoulder until your feel a gentle stretch in the muscles on the opposite side of your neck.  Hold __________ seconds. Repeat __________ times. Complete this exercise __________ times per day. STRETCH - Cervical Rotators   Stand or sit on a firm surface. Assume a good posture: chest up, shoulders drawn back, abdominal muscles slightly tense, knees unlocked (if standing) and feet hip width apart.  Keeping your eyes level with the ground, slowly turn your head until you feel a gentle stretch along the back and opposite side of your neck.  Hold __________ seconds. Repeat __________ times. Complete this exercise __________ times per day. RANGE OF MOTION - Neck Circles   Stand or sit on a firm surface. Assume a good posture: chest up, shoulders drawn back, abdominal muscles slightly tense, knees unlocked (if standing) and feet hip width apart.  Gently roll your head down and around from the   back of one shoulder to the back of the other. The motion should never be forced or painful.  Repeat the motion 10-20 times, or until you feel the neck muscles relax and loosen. Repeat __________ times. Complete the exercise __________ times per day. STRENGTHENING EXERCISES - Cervical Strain and Sprain These exercises may help you when beginning to rehabilitate your injury. They may  resolve your symptoms with or without further involvement from your physician, physical therapist, or athletic trainer. While completing these exercises, remember:   Muscles can gain both the endurance and the strength needed for everyday activities through controlled exercises.  Complete these exercises as instructed by your physician, physical therapist, or athletic trainer. Progress the resistance and repetitions only as guided.  You may experience muscle soreness or fatigue, but the pain or discomfort you are trying to eliminate should never worsen during these exercises. If this pain does worsen, stop and make certain you are following the directions exactly. If the pain is still present after adjustments, discontinue the exercise until you can discuss the trouble with your clinician. STRENGTH - Cervical Flexors, Isometric  Face a wall, standing about 6 inches away. Place a small pillow, a ball about 6-8 inches in diameter, or a folded towel between your forehead and the wall.  Slightly tuck your chin and gently push your forehead into the soft object. Push only with mild to moderate intensity, building up tension gradually. Keep your jaw and forehead relaxed.  Hold 10 to 20 seconds. Keep your breathing relaxed.  Release the tension slowly. Relax your neck muscles completely before you start the next repetition. Repeat __________ times. Complete this exercise __________ times per day. STRENGTH- Cervical Lateral Flexors, Isometric   Stand about 6 inches away from a wall. Place a small pillow, a ball about 6-8 inches in diameter, or a folded towel between the side of your head and the wall.  Slightly tuck your chin and gently tilt your head into the soft object. Push only with mild to moderate intensity, building up tension gradually. Keep your jaw and forehead relaxed.  Hold 10 to 20 seconds. Keep your breathing relaxed.  Release the tension slowly. Relax your neck muscles completely  before you start the next repetition. Repeat __________ times. Complete this exercise __________ times per day. STRENGTH - Cervical Extensors, Isometric   Stand about 6 inches away from a wall. Place a small pillow, a ball about 6-8 inches in diameter, or a folded towel between the back of your head and the wall.  Slightly tuck your chin and gently tilt your head back into the soft object. Push only with mild to moderate intensity, building up tension gradually. Keep your jaw and forehead relaxed.  Hold 10 to 20 seconds. Keep your breathing relaxed.  Release the tension slowly. Relax your neck muscles completely before you start the next repetition. Repeat __________ times. Complete this exercise __________ times per day. POSTURE AND BODY MECHANICS CONSIDERATIONS - Cervical Strain and Sprain Keeping correct posture when sitting, standing or completing your activities will reduce the stress put on different body tissues, allowing injured tissues a chance to heal and limiting painful experiences. The following are general guidelines for improved posture. Your physician or physical therapist will provide you with any instructions specific to your needs. While reading these guidelines, remember:  The exercises prescribed by your provider will help you have the flexibility and strength to maintain correct postures.  The correct posture provides the optimal environment for your joints to   work. All of your joints have less wear and tear when properly supported by a spine with good posture. This means you will experience a healthier, less painful body.  Correct posture must be practiced with all of your activities, especially prolonged sitting and standing. Correct posture is as important when doing repetitive low-stress activities (typing) as it is when doing a single heavy-load activity (lifting). PROLONGED STANDING WHILE SLIGHTLY LEANING FORWARD When completing a task that requires you to lean  forward while standing in one place for a long time, place either foot up on a stationary 2- to 4-inch high object to help maintain the best posture. When both feet are on the ground, the low back tends to lose its slight inward curve. If this curve flattens (or becomes too large), then the back and your other joints will experience too much stress, fatigue more quickly, and can cause pain.  RESTING POSITIONS Consider which positions are most painful for you when choosing a resting position. If you have pain with flexion-based activities (sitting, bending, stooping, squatting), choose a position that allows you to rest in a less flexed posture. You would want to avoid curling into a fetal position on your side. If your pain worsens with extension-based activities (prolonged standing, working overhead), avoid resting in an extended position such as sleeping on your stomach. Most people will find more comfort when they rest with their spine in a more neutral position, neither too rounded nor too arched. Lying on a non-sagging bed on your side with a pillow between your knees, or on your back with a pillow under your knees will often provide some relief. Keep in mind, being in any one position for a prolonged period of time, no matter how correct your posture, can still lead to stiffness. WALKING Walk with an upright posture. Your ears, shoulders, and hips should all line up. OFFICE WORK When working at a desk, create an environment that supports good, upright posture. Without extra support, muscles fatigue and lead to excessive strain on joints and other tissues. CHAIR:  A chair should be able to slide under your desk when your back makes contact with the back of the chair. This allows you to work closely.  The chair's height should allow your eyes to be level with the upper part of your monitor and your hands to be slightly lower than your elbows.  Body position:  Your feet should make contact with the  floor. If this is not possible, use a foot rest.  Keep your ears over your shoulders. This will reduce stress on your neck and low back. Document Released: 07/26/2005 Document Revised: 12/10/2013 Document Reviewed: 11/07/2008 [employer] Patient Information 2015 [employer]. This information is not intended to replace advice given to you by your health care provider. Make sure you discuss any questions you have with your health care provider.  
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
## 2014-08-28 DIAGNOSIS — M5412 Radiculopathy, cervical region: Secondary | ICD-10-CM | POA: Insufficient documentation

## 2014-08-28 DIAGNOSIS — M5030 Other cervical disc degeneration, unspecified cervical region: Secondary | ICD-10-CM | POA: Insufficient documentation

## 2014-09-13 ENCOUNTER — Ambulatory Visit (INDEPENDENT_AMBULATORY_CARE_PROVIDER_SITE_OTHER): Payer: No Typology Code available for payment source | Admitting: Physician Assistant

## 2014-09-13 ENCOUNTER — Encounter: Payer: Self-pay | Admitting: Physician Assistant

## 2014-09-13 VITALS — BP 128/75 | HR 64 | Ht 72.0 in | Wt 197.0 lb

## 2014-09-13 DIAGNOSIS — M5010 Cervical disc disorder with radiculopathy, unspecified cervical region: Secondary | ICD-10-CM

## 2014-09-13 DIAGNOSIS — J209 Acute bronchitis, unspecified: Secondary | ICD-10-CM | POA: Diagnosis not present

## 2014-09-13 MED ORDER — AZITHROMYCIN 250 MG PO TABS: ORAL_TABLET | ORAL | Status: DC

## 2014-09-13 MED ORDER — ALBUTEROL SULFATE HFA 108 (90 BASE) MCG/ACT IN AERS: 2.0000 | INHALATION_SPRAY | Freq: Four times a day (QID) | RESPIRATORY_TRACT | Status: DC

## 2014-09-13 NOTE — Patient Instructions (Signed)

## 2014-09-15 NOTE — Progress Notes (Signed)
   Subjective:    Patient ID: Victor Price, male    DOB: 1951/04/11, 64 y.o.   MRN: [id number]  HPI Pt presents to clinic with unresolving cervical radiculopathy in the c6 distrubution.home exercises, prednisone, dicolfenac, mobic tried with little relief. Did feel a little better on prednisone. norco helps some but does not like to take.  Pt has a lot of ongoing mucus but seems to be worsening. More mucus production than usally. Cough productive and increasing for last 3 weeks. Certainly feels fatigued. No fever, sinus pressure, ear pain or ST. Using mucinex on a regular basis.    Review of Systems  All other systems reviewed and are negative.      Objective:   Physical Exam  Constitutional: He appears well-developed and well-nourished.  HENT:  Head: Normocephalic and atraumatic.  Right Ear: External ear normal.  Left Ear: External ear normal.  Nose: Nose normal.  Mouth/Throat: Oropharynx is clear and moist.  Eyes: Conjunctivae are normal. Right eye exhibits no discharge. Left eye exhibits no discharge.  Neck: Normal range of motion. Neck supple.  Cardiovascular: Normal rate, regular rhythm and normal heart sounds.   Pulmonary/Chest: Effort normal.  Coarse breath sounds with scatter rhonchi but cleared with cough.  No wheezing.   Musculoskeletal:  NROM of neck.  No pain with palpation over c-spine.  Strength 5/5 upper extremities, bilaterally.   Hand grip 5/5, bilaterally.  Describes numbness and tingling down right arm/forearm into right thumb and index fingers  Lymphadenopathy:    He has no cervical adenopathy.  Skin: Skin is dry.  Psychiatric: He has a normal mood and affect. His behavior is normal.          Assessment & Plan:  Cervical radiculopathy C6 distrubution- xray cervical showed some c5-C6 disc flattening(mild degeneration) discuss MRI and formal PT. Pt request to hold off on MRI. Will order PT. If not improving needs MRI and visit with Dr. Darene Lamer. Continue  NSAIDs. Norco as needed.   Acute bronchitis- zpak given to continue with mucinex. Follow up if not improving.

## 2014-09-20 ENCOUNTER — Ambulatory Visit (INDEPENDENT_AMBULATORY_CARE_PROVIDER_SITE_OTHER): Payer: No Typology Code available for payment source | Admitting: Physical Therapy

## 2014-09-28 ENCOUNTER — Other Ambulatory Visit: Payer: Self-pay | Admitting: Family Medicine

## 2014-10-02 ENCOUNTER — Ambulatory Visit (INDEPENDENT_AMBULATORY_CARE_PROVIDER_SITE_OTHER): Payer: No Typology Code available for payment source | Admitting: Physical Therapy

## 2014-10-02 DIAGNOSIS — M542 Cervicalgia: Secondary | ICD-10-CM | POA: Diagnosis not present

## 2014-10-02 DIAGNOSIS — M2560 Stiffness of unspecified joint, not elsewhere classified: Secondary | ICD-10-CM

## 2014-10-02 NOTE — Therapy (Signed)
[place] [address] [place], [place], [address] Phone: [phone]   Fax:  [phone]  Physical Therapy Treatment  Patient Details  Name: Victor Price MRN: [id number] Date of Birth: Feb 15, 1951 Referring Provider:  Donella Stade, PA-C  Encounter Date: 10/02/2014      PT End of Session - 10/02/14 0705    Visit Number 2   Number of Visits 4   Date for PT Re-Evaluation 10/18/14   PT Start Time 0703   PT Stop Time 7782   PT Time Calculation (min) 54 min      Past Medical History  Diagnosis Date  . Pneumonia     gets recurrent PNA in left lung    No past surgical history on file.  There were no vitals taken for this visit.  Visit Diagnosis:  Neck pain  Joint stiffness      Subjective Assessment - 10/02/14 0705    Symptoms The exercises I was given made my other arm go to sleep so I stopped doing them.    Currently in Pain? Other (Comment)  Arm goes to sleep in afternoon and evening   Pain Score 0-No pain   Pain Radiating Towards Right arm to thumb and first 2 fingers    Aggravating Factors  "when I stop everything or get still"    Pain Relieving Factors hanging up side down in home traction unit.           [facility] PT Assessment - 10/02/14 0001    Assessment   Medical Diagnosis cervical radiculopathy    Precautions   Precautions None                  [facility] Adult PT Treatment/Exercise - 10/02/14 0001    Exercises   Exercises Neck;Shoulder   Neck Exercises: Machines for Strengthening   UBE (Upper Arm Bike) Level 1, 2 min each direction    Neck Exercises: Seated   Neck Retraction 5 reps   Shoulder Shrugs 10 reps   Shoulder Rolls 10 reps   Postural Training Improved with cues   Tactile and VC for more erect posture, neutral head position   Other Seated Exercise Head presses, shoulder presses  x 5 of each.    Neck Exercises: Supine   Other Supine Exercise Head/ shoulder presses, 5  sec hold x 15 reps    Modalities   Modalities Ultrasound   Ultrasound   Ultrasound Location R cerv paraspinals (c4-6) and R levator insertion    Ultrasound Parameters 100%, 1.1 w/cm2, 3.3 mHz, 8 min (4 to each area noted), COMBO    Ultrasound Goals Pain   Manual Therapy   Manual Therapy Myofascial release;Other (comment)   Myofascial Release R scalenes, upper trap, SCM, ant neck on R and L: held until release noted.    Other Manual Therapy TPR to R levator insertion and adjacent parapinals    Neck Exercises: Stretches   Upper Trapezius Stretch 2 reps;30 seconds   Levator Stretch 3 reps;10 seconds  POE, cervical diagonals.    Chest Stretch 3 reps;30 seconds                PT Education - 10/02/14 0919    Education provided Yes   Education Details HEP, postural corrections   Person(s) Educated Patient   Methods Explanation;Demonstration;Handout   Comprehension Verbalized understanding;Returned demonstration             PT Long Term Goals - 10/02/14 4235  PT LONG TERM GOAL #1   Title Demonstrate and/or verbalize techniques to reduce the risk of re-injury to include information on posture and body mechanics    Time 4   Period Weeks   Status On-going   PT LONG TERM GOAL #2   Title Be independent with advanced HEP    Time 4   Period Weeks   Status On-going   PT LONG TERM GOAL #3   Title Report pain decrease to allow patient to sleep per his previous level   Time 4   Period Weeks   Status On-going   PT LONG TERM GOAL #4   Title Improve FOTO =/<26% limited    Time 4   Period Weeks   Status On-going               Plan - 10/02/14 0800    Clinical Impression Statement Pt reported he was unable to tolerate levator stretch from previous HEP due to increased parasthesia in B arms.  Pt tolerated new exercises this session, and demonstrated improved posture post cues. No goals met yet secondary to number of visits.    PT Frequency 1x / week   PT Duration 4  weeks   PT Treatment/Interventions Cryotherapy;Electrical Stimulation;Ultrasound;Traction;Therapeutic exercise;Moist Heat;Patient/family education;Manual techniques   PT Next Visit Plan Continue manual therapy, modalities as needed, and progressive cervical spine/R shoulder strengthening.    Consulted and Agree with Plan of Care Patient        Problem List Patient Active Problem List   Diagnosis Date Noted  . Radiculitis of right cervical region 08/28/2014  . DDD (degenerative disc disease), cervical 08/28/2014  . CKD (chronic kidney disease) stage 2, GFR 60-89 ml/min 08/14/2014  . Insomnia 04/30/2014  . Iliotibial band syndrome of left side 04/05/2013  . Peripheral neuropathy 04/02/2013  . Herniated disc 01/29/2013  . BPH (benign prostatic hyperplasia) 12/14/2011  . ERECTILE DYSFUNCTION, ORGANIC 07/23/2010  . THYROID NODULE, LEFT 05/23/2007  . HYPERLIPIDEMIA 05/17/2006  . HYPERTENSION, BENIGN SYSTEMIC 05/17/2006    Kerin Perna, PTA 10/02/2014 9:25 AM  [place], [place], [address] Phone: [phone]   Fax:  [phone]

## 2014-10-02 NOTE — Patient Instructions (Signed)
Scapula Adduction With Pectorals, Mid-Range   Stand in doorframe with palms against frame and arms at 90. Lean forward and squeeze shoulder blades. Hold __30_ seconds. Repeat _2__ times per session. Do _1-2_ sessions per day.  Copyright  V[employer]. All rights reserved.  Pectoral Stretch, Standing   Stand, hands clasped behind back. Lift hands away from back. Hold _30__ seconds. Repeat _2_ times per session. Do _2__ sessions per day.  Copyright  V[employer]. All rights reserved.  The patient is advised to apply ice or cold packs intermittently as needed to relieve pain. Place ice on neck/R shoulder x 15 min.  Can repeat 1 hour later, if needed

## 2014-10-09 ENCOUNTER — Ambulatory Visit (INDEPENDENT_AMBULATORY_CARE_PROVIDER_SITE_OTHER): Payer: No Typology Code available for payment source | Admitting: Physical Therapy

## 2014-10-09 DIAGNOSIS — M542 Cervicalgia: Secondary | ICD-10-CM | POA: Diagnosis not present

## 2014-10-09 DIAGNOSIS — M2560 Stiffness of unspecified joint, not elsewhere classified: Secondary | ICD-10-CM

## 2014-10-09 NOTE — Patient Instructions (Signed)
Patient issued blue band and two new exercises, horizontal abduction in hooklying and SASH.

## 2014-10-09 NOTE — Therapy (Signed)
[facility] [place], [place], [address] Phone: [phone]   Fax:  [phone]  Physical Therapy Treatment  Patient Details  Name: Victor Price MRN: [id number] Date of Birth: 02-Dec-1950 Referring Provider:  Donella Stade, PA-C  Encounter Date: 10/09/2014      PT End of Session - 10/09/14 0706    Visit Number 3   Number of Visits 4   Date for PT Re-Evaluation 10/18/14   PT Start Time 0704   PT Stop Time 0750   PT Time Calculation (min) 46 min   Activity Tolerance Patient tolerated treatment well      Past Medical History  Diagnosis Date  . Pneumonia     gets recurrent PNA in left lung    No past surgical history on file.  There were no vitals taken for this visit.  Visit Diagnosis:  Neck pain  Joint stiffness      Subjective Assessment - 10/09/14 0707    Symptoms Numbness in hands continue especially at night and this is leading into discomfort   Pain Score 5    Aggravating Factors  only at night with relaxing   Pain Relieving Factors inversion table                    [facility] Adult PT Treatment/Exercise - 10/09/14 0001    Neck Exercises: Machines for Strengthening   UBE (Upper Arm Bike) L2x 2'/2'   Neck Exercises: Supine   Cervical Isometrics 10 reps;5 secs  head presses   Other Supine Exercise 10 reps, 5 sec holds shoulder presses   Shoulder Exercises: Supine   Horizontal ABduction Strengthening;Both;Theraband  3x10   Theraband Level (Shoulder Horizontal ABduction) Level 4 (Blue)   Other Supine Exercises 3x10 SASH with blue band bilat   Modalities   Modalities Traction   Electrical Stimulation   Electrical Stimulation Location cervical   Electrical Stimulation Parameters intermittent, 22#/11#, 60sec/20sec   Electrical Stimulation Goals Pain  numbness                PT Education - 10/09/14 0720    Education provided Yes   Education Details HEP  progression, hooklying horizontal abd and SASH   Person(s) Educated Patient   Methods Explanation;Handout   Comprehension Returned demonstration             PT Long Term Goals - 10/09/14 0758    PT LONG TERM GOAL #1   Title Demonstrate and/or verbalize techniques to reduce the risk of re-injury to include information on posture and body mechanics    Status Achieved   PT LONG TERM GOAL #2   Title Be independent with advanced HEP    Status On-going   PT LONG TERM GOAL #3   Title Report pain decrease to allow patient to sleep per his previous level   Status On-going   PT LONG TERM GOAL #4   Title Improve FOTO =/<26% limited    Status On-going               Plan - 10/09/14 0744    Clinical Impression Statement Patiient has received minimal results from PT thus far, he has one more visit scheduled.  He will schedule an appointment with his MD to discuss options.  Tried traction today, will see if this makes a difference   Pt will benefit from skilled therapeutic intervention in order to improve on the following deficits Impaired sensation;Postural dysfunction;Decreased strength  Rehab Potential Good   PT Frequency 1x / week   PT Duration 4 weeks   PT Treatment/Interventions Traction;Moist Heat;Patient/family education;Therapeutic exercise;Ultrasound;Manual techniques;Cryotherapy;Electrical Stimulation   PT Next Visit Plan try traction again, assess for d/c vs renewal if traction helps   Consulted and Agree with Plan of Care Patient        Problem List Patient Active Problem List   Diagnosis Date Noted  . Radiculitis of right cervical region 08/28/2014  . DDD (degenerative disc disease), cervical 08/28/2014  . CKD (chronic kidney disease) stage 2, GFR 60-89 ml/min 08/14/2014  . Insomnia 04/30/2014  . Iliotibial band syndrome of left side 04/05/2013  . Peripheral neuropathy 04/02/2013  . Herniated disc 01/29/2013  . BPH (benign prostatic hyperplasia) 12/14/2011   . ERECTILE DYSFUNCTION, ORGANIC 07/23/2010  . THYROID NODULE, LEFT 05/23/2007  . HYPERLIPIDEMIA 05/17/2006  . HYPERTENSION, BENIGN SYSTEMIC 05/17/2006    Jeral Pinch, PT 10/09/2014, [facility] [place], [place], [address] Phone: [phone]   Fax:  [phone]

## 2014-10-11 ENCOUNTER — Encounter: Payer: Self-pay | Admitting: Physician Assistant

## 2014-10-11 ENCOUNTER — Ambulatory Visit (INDEPENDENT_AMBULATORY_CARE_PROVIDER_SITE_OTHER): Payer: No Typology Code available for payment source | Admitting: Family Medicine

## 2014-10-11 VITALS — BP 124/74 | HR 70 | Temp 98.1°F | Ht 72.0 in | Wt 196.0 lb

## 2014-10-11 DIAGNOSIS — M5412 Radiculopathy, cervical region: Secondary | ICD-10-CM

## 2014-10-11 DIAGNOSIS — G4700 Insomnia, unspecified: Secondary | ICD-10-CM

## 2014-10-11 DIAGNOSIS — J189 Pneumonia, unspecified organism: Secondary | ICD-10-CM

## 2014-10-11 MED ORDER — TRAZODONE HCL 50 MG PO TABS: 25.0000 mg | ORAL_TABLET | Freq: Every evening | ORAL | Status: DC | PRN

## 2014-10-11 MED ORDER — LEVOFLOXACIN 750 MG PO TABS: 750.0000 mg | ORAL_TABLET | Freq: Every day | ORAL | Status: DC

## 2014-10-11 NOTE — Progress Notes (Signed)
   Subjective:    Patient ID: Victor Price, male    DOB: 10-25-1950, 64 y.o.   MRN: [id number]  HPI  Feeling very SOB at night. Having alot of wheezing and mucous in his chest.  Thinks he has pneumonia now. NO nasal congestion.  No St or ear pain. Feels like running a low grade fever.   Right cervical radiculopathy - has been doing PT for 3 weeks. He's not sure it's really been helping. Now his left arm is going asleep mostly at night.  He is a side sleeper. He says he plans to complete physical therapy but if it's not successful then he would like to see a specific neurosurgeon out of [place].  Insomnia - Says the Belsomra was over $600. Would like to try something. He has tried Ambien in the past.  Review of Systems     Objective:   Physical Exam  Constitutional: He is oriented to person, place, and time. He appears well-developed and well-nourished.  HENT:  Head: Normocephalic and atraumatic.  Right Ear: External ear normal.  Left Ear: External ear normal.  Nose: Nose normal.  Mouth/Throat: Oropharynx is clear and moist.  TMs and canals are clear.   Eyes: Conjunctivae and EOM are normal. Pupils are equal, round, and reactive to light.  Neck: Neck supple. No thyromegaly present.  Cardiovascular: Normal rate and normal heart sounds.   Pulmonary/Chest: Effort normal and breath sounds normal.  Lymphadenopathy:    He has no cervical adenopathy.  Neurological: He is alert and oriented to person, place, and time.  Skin: Skin is warm and dry.  Psychiatric: He has a normal mood and affect.          Assessment & Plan:  Community-acquired pneumonia-we'll go ahead and treat with Levaquin 750 mg daily 5 days. Is not significantly better in one week to please give the office a call.  Right cervical radiculopathy-encouraged him to complete physical therapy. That point is not improving then consider MRI and referral to neurosurgeon per his preference. I did encourage him to call  their office as most neurosurgeons to require an MRI to be seen. Next  Insomnia-recommend a trial of trazodone since the Belsomra was quite expensive. Recommend start with half a tab and increase to 1-2 tabs as needed. Do not go above that without calling the office back.

## 2014-10-16 ENCOUNTER — Ambulatory Visit (INDEPENDENT_AMBULATORY_CARE_PROVIDER_SITE_OTHER): Payer: No Typology Code available for payment source | Admitting: Physical Therapy

## 2014-10-16 DIAGNOSIS — M2560 Stiffness of unspecified joint, not elsewhere classified: Secondary | ICD-10-CM

## 2014-10-16 DIAGNOSIS — M542 Cervicalgia: Secondary | ICD-10-CM

## 2014-10-16 NOTE — Therapy (Signed)
[facility] [place], [place], [address] Phone: [phone]   Fax:  [phone]  Physical Therapy Treatment  Patient Details  Name: Victor Price MRN: [id number] Date of Birth: [birth year] Referring Provider:  Donella Stade, PA-C  Encounter Date: 10/16/2014      PT End of Session - 10/16/14 0705    Visit Number 4   Number of Visits 4   Date for PT Re-Evaluation 10/18/14   PT Start Time 0702   PT Stop Time 0738   PT Time Calculation (min) 36 min      Past Medical History  Diagnosis Date  . Pneumonia     gets recurrent PNA in left lung    No past surgical history on file.  There were no vitals taken for this visit.  Visit Diagnosis:  Neck pain  Joint stiffness      Subjective Assessment - 10/16/14 0705    Symptoms Pt reports things are feeling worse. Now Rt shoulder is painful and Lt hand is numb and "fat feeling".    Currently in Pain? Yes   Pain Score 2    Pain Location Shoulder   Pain Orientation Right   Pain Radiating Towards numbness in both hands.  Left hand feels "fat".    Aggravating Factors  Being still at night, performing band shoulder exercises.    Pain Relieving Factors Inversion table           [facility] PT Assessment - 10/16/14 0001    Assessment   Medical Diagnosis cervical radiculopathy    Precautions   Precautions None                  [facility] Adult PT Treatment/Exercise - 10/16/14 0001    Exercises   Exercises Neck;Shoulder   Neck Exercises: Supine   Cervical Isometrics 10 reps;5 secs  head presses   Other Supine Exercise 10 reps, 5 sec holds shoulder presses   Shoulder Exercises: Prone   Other Prone Exercises Quadruped: attempted arm ext.  Pt reported increased neck pain after 2 reps - activity stopped.    Shoulder Exercises: Stretch   Other Shoulder Stretches Dynamic lat stretch with hands on shoulder height surface x 5 reps    Other Shoulder Stretches  Doorway pec stretch R/L x 30 sec x 2 reps.  Hands clasp hands behind back, elbows extended held 30 sec x 2 reps   Manual Therapy   Manual Therapy Other (comment)   Other Manual Therapy TPR to R levator, rhomboid, T3 paraspinal.  Dynamic tape application to B cervical to T3 paraspinals for feedback to self correct forward head posture.                 PT Education - 10/16/14 0744    Education provided Yes   Education Details HEP   Person(s) Educated Patient   Methods Explanation;Demonstration   Comprehension Returned demonstration;Verbalized understanding             PT Long Term Goals - 10/16/14 0745    PT LONG TERM GOAL #1   Title Demonstrate and/or verbalize techniques to reduce the risk of re-injury to include information on posture and body mechanics    Time 4   Period Weeks   Status Achieved   PT LONG TERM GOAL #2   Title Be independent with advanced HEP    Time 4   Period Weeks   Status Achieved   PT LONG TERM GOAL #3  Title Report pain decrease to allow patient to sleep per his previous level   Time 4   Period Weeks   Status Not Met   PT LONG TERM GOAL #4   Title Improve FOTO =/<26% limited    Time 4   Period Weeks   Status Unable to assess               Plan - 10/16/14 0756    Clinical Impression Statement Pt tolerated new stretches without increase in symptoms.  Pt has had no improvement of symptoms since beginning therapy. Pt is interested in D/C and seeking further testing.     Pt will benefit from skilled therapeutic intervention in order to improve on the following deficits Impaired sensation;Postural dysfunction;Decreased strength   PT Treatment/Interventions Traction;Moist Heat;Patient/family education;Therapeutic exercise;Ultrasound;Manual techniques;Cryotherapy;Electrical Stimulation   PT Next Visit Plan Spoke to supervising PT; will D/C and return to MD at this time.    Consulted and Agree with Plan of Care Patient         Problem List Patient Active Problem List   Diagnosis Date Noted  . Radiculitis of right cervical region 08/28/2014  . DDD (degenerative disc disease), cervical 08/28/2014  . CKD (chronic kidney disease) stage 2, GFR 60-89 ml/min 08/14/2014  . Insomnia 04/30/2014  . Iliotibial band syndrome of left side 04/05/2013  . Peripheral neuropathy 04/02/2013  . Herniated disc 01/29/2013  . BPH (benign prostatic hyperplasia) 12/14/2011  . ERECTILE DYSFUNCTION, ORGANIC 07/23/2010  . THYROID NODULE, LEFT 05/23/2007  . HYPERLIPIDEMIA 05/17/2006  . HYPERTENSION, BENIGN SYSTEMIC 05/17/2006   Kerin Perna, PTA 10/16/2014 8:04 AM  [facility] [place] [facility] [place], [place], [address] Phone: [phone]   Fax:  [phone]

## 2014-10-16 NOTE — Patient Instructions (Signed)
Instructed pt on the following stretches:   Lat stretch with arms on counter top. Hold 20-30 sec. Repeat 2-3 times.   Doorway pec stretch.  Arms at 90 degrees to the side (can be done 1 arm at a time). Hold 20-30 sec. Repeat 1x, 2x/ day.

## 2014-10-16 NOTE — Therapy (Signed)
[facility] [place], [place], [address] Phone: [phone]   Fax:  [phone]  Physical Therapy Treatment  Patient Details  Name: Victor Price MRN: [id number] Date of Birth: [birth year] Referring Provider:  Donella Stade, PA-C  Encounter Date: 10/16/2014      PT End of Session - 10/16/14 0705    Visit Number 4   Number of Visits 4   Date for PT Re-Evaluation 10/18/14   PT Start Time 0702   PT Stop Time 0738   PT Time Calculation (min) 36 min      Past Medical History  Diagnosis Date  . Pneumonia     gets recurrent PNA in left lung    No past surgical history on file.  There were no vitals taken for this visit.  Visit Diagnosis:  Neck pain  Joint stiffness      Subjective Assessment - 10/16/14 0705    Symptoms Pt reports things are feeling worse. Now Rt shoulder is painful and Lt hand is numb and "fat feeling".    Currently in Pain? Yes   Pain Score 2    Pain Location Shoulder   Pain Orientation Right   Pain Radiating Towards numbness in both hands.  Left hand feels "fat".    Aggravating Factors  Being still at night, performing band shoulder exercises.    Pain Relieving Factors Inversion table           [facility] PT Assessment - 10/16/14 0001    Assessment   Medical Diagnosis cervical radiculopathy    Precautions   Precautions None                  [facility] Adult PT Treatment/Exercise - 10/16/14 0001    Exercises   Exercises Neck;Shoulder   Neck Exercises: Supine   Cervical Isometrics 10 reps;5 secs  head presses   Other Supine Exercise 10 reps, 5 sec holds shoulder presses   Shoulder Exercises: Prone   Other Prone Exercises Quadruped: attempted arm ext.  Pt reported increased neck pain after 2 reps - activity stopped.    Shoulder Exercises: Stretch   Other Shoulder Stretches Dynamic lat stretch with hands on shoulder height surface x 5 reps    Other Shoulder Stretches  Doorway pec stretch R/L x 30 sec x 2 reps.  Hands clasp hands behind back, elbows extended held 30 sec x 2 reps   Manual Therapy   Manual Therapy Other (comment)   Other Manual Therapy TPR to R levator, rhomboid, T3 paraspinal.  Dynamic tape application to B cervical to T3 paraspinals for feedback to self correct forward head posture.                 PT Education - 10/16/14 0744    Education provided Yes   Education Details HEP   Person(s) Educated Patient   Methods Explanation;Demonstration   Comprehension Returned demonstration;Verbalized understanding             PT Long Term Goals - 10/16/14 0745    PT LONG TERM GOAL #1   Title Demonstrate and/or verbalize techniques to reduce the risk of re-injury to include information on posture and body mechanics    Time 4   Period Weeks   Status Achieved   PT LONG TERM GOAL #2   Title Be independent with advanced HEP    Time 4   Period Weeks   Status Achieved   PT LONG TERM GOAL #3  Title Report pain decrease to allow patient to sleep per his previous level   Time 4   Period Weeks   Status Not Met   PT LONG TERM GOAL #4   Title Improve FOTO =/<26% limited    Time 4   Period Weeks   Status Unable to assess               Plan - 10/16/14 0756    Clinical Impression Statement Pt tolerated new stretches without increase in symptoms.  Pt has had no improvement of symptoms since beginning therapy. Pt is interested in D/C and seeking further testing.     Pt will benefit from skilled therapeutic intervention in order to improve on the following deficits Impaired sensation;Postural dysfunction;Decreased strength   PT Treatment/Interventions Traction;Moist Heat;Patient/family education;Therapeutic exercise;Ultrasound;Manual techniques;Cryotherapy;Electrical Stimulation   PT Next Visit Plan Spoke to supervising PT; will D/C and return to MD at this time.    Consulted and Agree with Plan of Care Patient         Problem List Patient Active Problem List   Diagnosis Date Noted  . Radiculitis of right cervical region 08/28/2014  . DDD (degenerative disc disease), cervical 08/28/2014  . CKD (chronic kidney disease) stage 2, GFR 60-89 ml/min 08/14/2014  . Insomnia 04/30/2014  . Iliotibial band syndrome of left side 04/05/2013  . Peripheral neuropathy 04/02/2013  . Herniated disc 01/29/2013  . BPH (benign prostatic hyperplasia) 12/14/2011  . ERECTILE DYSFUNCTION, ORGANIC 07/23/2010  . THYROID NODULE, LEFT 05/23/2007  . HYPERLIPIDEMIA 05/17/2006  . HYPERTENSION, BENIGN SYSTEMIC 05/17/2006  .  PHYSICAL THERAPY DISCHARGE SUMMARY  Visits from Start of Care: 4 Current functional level related to goals / functional outcomes: See above   Remaining deficits: Patient has had minimal response to formal PT   Education / Equipment: HEP  Plan: Patient agrees to discharge.  Patient goals were partially met. Patient is being discharged due to lack of progress.  ?????      Jeral Pinch, PT 10/16/2014, 10:49 AM  [facility] [place], [place], [address] Phone: [phone]   Fax:  [phone]

## 2014-10-18 ENCOUNTER — Other Ambulatory Visit: Payer: Self-pay | Admitting: Family Medicine

## 2014-10-22 ENCOUNTER — Ambulatory Visit (INDEPENDENT_AMBULATORY_CARE_PROVIDER_SITE_OTHER): Payer: No Typology Code available for payment source

## 2014-10-22 ENCOUNTER — Ambulatory Visit (INDEPENDENT_AMBULATORY_CARE_PROVIDER_SITE_OTHER): Payer: No Typology Code available for payment source | Admitting: Family Medicine

## 2014-10-22 ENCOUNTER — Encounter: Payer: Self-pay | Admitting: Family Medicine

## 2014-10-22 VITALS — BP 139/79 | HR 67 | Wt 192.0 lb

## 2014-10-22 DIAGNOSIS — R059 Cough, unspecified: Secondary | ICD-10-CM

## 2014-10-22 DIAGNOSIS — J209 Acute bronchitis, unspecified: Secondary | ICD-10-CM

## 2014-10-22 DIAGNOSIS — J440 Chronic obstructive pulmonary disease with acute lower respiratory infection: Secondary | ICD-10-CM

## 2014-10-22 DIAGNOSIS — R05 Cough: Secondary | ICD-10-CM

## 2014-10-22 DIAGNOSIS — R0602 Shortness of breath: Secondary | ICD-10-CM

## 2014-10-22 MED ORDER — PREDNISONE 20 MG PO TABS: 40.0000 mg | ORAL_TABLET | Freq: Every day | ORAL | Status: DC

## 2014-10-22 NOTE — Progress Notes (Signed)
   Subjective:    Patient ID: Victor Price, male    DOB: April 07, 1951, 64 y.o.   MRN: [id number]  HPI Still feeling sick. He did feel better on the antibiotic, Levaquin, and did feel better. Says got worse the next day. No fever. Cough is productive but is clear and sometimes green. Still feels some SBO.    Review of Systems     Objective:   Physical Exam  Constitutional: He is oriented to person, place, and time. He appears well-developed and well-nourished.  HENT:  Head: Normocephalic and atraumatic.  Right Ear: External ear normal.  Left Ear: External ear normal.  Nose: Nose normal.  Mouth/Throat: Oropharynx is clear and moist.  TMs and canals are clear.   Eyes: Conjunctivae and EOM are normal. Pupils are equal, round, and reactive to light.  Neck: Neck supple. No thyromegaly present.  Cardiovascular: Normal rate and normal heart sounds.   Pulmonary/Chest: Effort normal. He has wheezes.  Diffuse respiratory wheezing. No rhonchi.  Lymphadenopathy:    He has no cervical adenopathy.  Neurological: He is alert and oriented to person, place, and time.  Skin: Skin is warm and dry.  Psychiatric: He has a normal mood and affect.          Assessment & Plan:  Acute on top of chronic bronchitis. Is actually been on 2 rounds of antibiotics and is still very short of breath and wheezing and getting green productive sputum. Going to go ahead and send him down for a chest x-ray as well as putting him on prednisone. We'll call with results once available to see if we need to put him on a third round of antibiotics or just treat him aggressively with prednisone and i inhaled albuterol.

## 2014-11-29 ENCOUNTER — Other Ambulatory Visit: Payer: Self-pay | Admitting: Family Medicine

## 2014-11-29 NOTE — Telephone Encounter (Signed)
Needs appt

## 2014-12-30 ENCOUNTER — Other Ambulatory Visit: Payer: Self-pay | Admitting: Family Medicine

## 2015-01-27 ENCOUNTER — Other Ambulatory Visit: Payer: Self-pay | Admitting: Family Medicine

## 2015-02-24 ENCOUNTER — Other Ambulatory Visit: Payer: Self-pay | Admitting: Family Medicine

## 2015-03-04 ENCOUNTER — Other Ambulatory Visit: Payer: Self-pay | Admitting: Family Medicine

## 2015-03-31 ENCOUNTER — Other Ambulatory Visit: Payer: Self-pay | Admitting: Family Medicine

## 2015-05-01 ENCOUNTER — Other Ambulatory Visit: Payer: Self-pay | Admitting: Family Medicine

## 2015-05-05 ENCOUNTER — Other Ambulatory Visit: Payer: Self-pay | Admitting: Family Medicine

## 2015-05-06 ENCOUNTER — Other Ambulatory Visit: Payer: Self-pay | Admitting: Family Medicine

## 2015-05-28 ENCOUNTER — Encounter: Payer: Self-pay | Admitting: Family Medicine

## 2015-05-28 ENCOUNTER — Other Ambulatory Visit: Payer: Self-pay | Admitting: Family Medicine

## 2015-05-28 ENCOUNTER — Ambulatory Visit (INDEPENDENT_AMBULATORY_CARE_PROVIDER_SITE_OTHER): Payer: BLUE CROSS/BLUE SHIELD | Admitting: Family Medicine

## 2015-05-28 VITALS — BP 130/83 | HR 61 | Temp 97.5°F | Resp 18 | Wt 193.4 lb

## 2015-05-28 DIAGNOSIS — L081 Erythrasma: Secondary | ICD-10-CM | POA: Diagnosis not present

## 2015-05-28 DIAGNOSIS — I10 Essential (primary) hypertension: Secondary | ICD-10-CM | POA: Diagnosis not present

## 2015-05-28 DIAGNOSIS — K219 Gastro-esophageal reflux disease without esophagitis: Secondary | ICD-10-CM

## 2015-05-28 DIAGNOSIS — E785 Hyperlipidemia, unspecified: Secondary | ICD-10-CM | POA: Diagnosis not present

## 2015-05-28 DIAGNOSIS — Z23 Encounter for immunization: Secondary | ICD-10-CM

## 2015-05-28 MED ORDER — CLINDAMYCIN PHOS-BENZOYL PEROX 1-5 % EX GEL: Freq: Two times a day (BID) | CUTANEOUS | Status: DC

## 2015-05-28 NOTE — Progress Notes (Signed)
Subjective:    Patient ID: Victor Price, male    DOB: 12-Sep-1950, 64 y.o.   MRN: [id number]  HPI Rash under his left arm x 2 weeks. Treating with tenactin FOR ABOUT 2 WEEKS. Heat makes it worse . Has similar rash in the groin area. Got better when it got cooler but now wore agAIN.  No new soaps lotions or perfumes and does not spray the arm area of his shirts with any type of stain remover etc.  Hypertension- Pt denies chest pain, SOB, dizziness, or heart palpitations.  Taking meds as directed w/o problems.  Denies medication side effects.    GERD - On prevacid daily.  Says the medication makes  His stomach hurt.     Review of Systems  BP 130/83 mmHg  Pulse 61  Temp(Src) 97.5 F (36.4 C) (Oral)  Resp 18  Wt 193 lb 6.4 oz (87.726 kg)  SpO2 99%    No Known Allergies  Past Medical History  Diagnosis Date  . Pneumonia     gets recurrent PNA in left lung    No past surgical history on file.  Social History   Social History  . Marital Status: Single    Spouse Name: N/A  . Number of Children: N/A  . Years of Education: N/A   Occupational History  . [employer]    Social History Main Topics  . Smoking status: Never Smoker   . Smokeless tobacco: Not on file  . Alcohol Use: No  . Drug Use: No  . Sexual Activity: Not on file   Other Topics Concern  . Not on file   Social History Narrative    No family history on file.  Outpatient Encounter Prescriptions as of 05/28/2015  Medication Sig  . AMBULATORY NON FORMULARY MEDICATION Medication Name:Inversion table for chronic low back pain.  Hinda Kehr 30 MG/ACT SOLN APPLY 2 PUMPS TOPICALLY EVERY AM  . bisoprolol-hydrochlorothiazide (ZIAC) 5-6.25 MG per tablet TAKE 1 TABLET BY MOUTH DAILY.  . [facility] LORATADINE-D 24 HOUR 10-240 MG per 24 hr tablet TAKE 1 TABLET BY MOUTH DAILY.  Marland Kitchen diclofenac (VOLTAREN) 75 MG EC tablet Take 1 tablet (75 mg total) by mouth 2 (two) times daily.  . Eszopiclone (ESZOPICLONE) 3 MG TABS Take 1 tablet (3  mg total) by mouth at bedtime. Take immediately before bedtime  . HYDROcodone-acetaminophen (NORCO) 7.5-325 MG per tablet Take 1 tablet by mouth every 8 (eight) hours as needed for moderate pain (cough).  . lansoprazole (PREVACID) 30 MG capsule Take 1 capsule (30 mg total) by mouth daily.  . Lidocaine-Hydrocortisone Ace (ANAMANTLE HC) 3-0.5 % KIT Apply 1 dose rectally up to  Bid prn for spasm.  . meloxicam (MOBIC) 15 MG tablet Take 1 tablet (15 mg total) by mouth daily. For 2 weeks take daily.  . methimazole (TAPAZOLE) 10 MG tablet Take 10 mg by mouth daily.    . mupirocin ointment (BACTROBAN) 2 % Apply topically 3 (three) times daily.  . predniSONE (DELTASONE) 20 MG tablet Take 2 tablets (40 mg total) by mouth daily.  . QUEtiapine (SEROQUEL) 50 MG tablet TAKE 0.5-1 TABLETS (25-50 MG TOTAL) BY MOUTH AT BEDTIME.  . rosuvastatin (CRESTOR) 10 MG tablet Take 1 tablet (10 mg total) by mouth daily.  . sildenafil (VIAGRA) 50 MG tablet Take 1 tablet (50 mg total) by mouth daily as needed for erectile dysfunction.  . traZODone (DESYREL) 50 MG tablet TAKE 0.5-2 TABLETS (25-100 MG TOTAL) BY MOUTH AT BEDTIME AS NEEDED  FOR SLEEP.  Marland Kitchen clindamycin-benzoyl peroxide (BENZACLIN) gel Apply topically 2 (two) times daily.  . [DISCONTINUED] albuterol (PROVENTIL HFA) 108 (90 BASE) MCG/ACT inhaler Inhale 2 puffs into the lungs 4 (four) times daily.   No facility-administered encounter medications on file as of 05/28/2015.          Objective:   Physical Exam  Constitutional: He is oriented to person, place, and time. He appears well-developed and well-nourished.  HENT:  Head: Normocephalic and atraumatic.  Cardiovascular: Normal rate, regular rhythm and normal heart sounds.   Pulmonary/Chest: Effort normal and breath sounds normal.  Neurological: He is alert and oriented to person, place, and time.  Skin: Skin is warm and dry. Rash noted.  Dusky pink well demarcated area uder both axilla with a fine scale on  the surface.   Psychiatric: He has a normal mood and affect. His behavior is normal.          Assessment & Plan:  HTN - well controlled. F/U in 6 months. Due for BMP today.  GERD-well-controlled on current regimen.  Hyperlipidemia-will have him move his statin to bedtime. Due to repeat labs again in January.  Erythrasma-discussed diagnosis. Will treat with BenzaClin gel. If not improving after 7-10 days and give [name] a call back. Skin scraping/KOH performed today.

## 2015-05-29 ENCOUNTER — Telehealth: Payer: Self-pay

## 2015-05-29 LAB — BASIC METABOLIC PANEL WITH GFR
BUN: 19 mg/dL (ref 7–25)
CO2: 30 mmol/L (ref 20–31)
CREATININE: 1.27 mg/dL — AB (ref 0.70–1.25)
Calcium: 9.6 mg/dL (ref 8.6–10.3)
Chloride: 102 mmol/L (ref 98–110)
GFR, Est African American: 70 mL/min (ref >=60)
GFR, Est Non African American: 60 mL/min (ref >=60)
Glucose, Bld: 82 mg/dL (ref 65–99)
Potassium: 4.3 mmol/L (ref 3.5–5.3)
Sodium: 140 mmol/L (ref 135–146)

## 2015-05-29 LAB — KOH PREP: RESULT - KOH: NONE SEEN

## 2015-05-29 MED ORDER — BISOPROLOL-HYDROCHLOROTHIAZIDE 5-6.25 MG PO TABS: 1.0000 | ORAL_TABLET | Freq: Every day | ORAL | Status: DC

## 2015-05-29 MED ORDER — LANSOPRAZOLE 30 MG PO CPDR: 30.0000 mg | DELAYED_RELEASE_CAPSULE | Freq: Every day | ORAL | Status: DC

## 2015-05-29 MED ORDER — HYDROXYZINE PAMOATE 25 MG PO CAPS: 25.0000 mg | ORAL_CAPSULE | Freq: Three times a day (TID) | ORAL | Status: DC | PRN

## 2015-05-29 NOTE — Telephone Encounter (Signed)
Pt c/o of wanting to scratch his skin off from the itching would like to know if there's anything he can do for the itching. Was given cream for to get rid of rash but was wondering if anything can help now for the itching;.

## 2015-05-29 NOTE — Telephone Encounter (Signed)
Oral script sent to pharmacy for vistaril.  Try to wear cotton frabric shirts and not synthetics.

## 2015-06-05 ENCOUNTER — Other Ambulatory Visit: Payer: Self-pay | Admitting: Family Medicine

## 2015-07-09 ENCOUNTER — Telehealth: Payer: Self-pay

## 2015-07-09 ENCOUNTER — Other Ambulatory Visit: Payer: Self-pay | Admitting: Physician Assistant

## 2015-07-09 MED ORDER — MUPIROCIN 2 % EX OINT: TOPICAL_OINTMENT | Freq: Three times a day (TID) | CUTANEOUS | Status: DC

## 2015-07-09 NOTE — Telephone Encounter (Signed)
Pt would like a refill of ointment due to recent cut on leg. Pt states he does not want an appointment for a cut. Spoke with Dr. Madilyn Fireman who states it's ok to refill but if it has not begun to heal in 5-7 days he should be seen. Pt notified.

## 2015-09-12 ENCOUNTER — Other Ambulatory Visit: Payer: Self-pay | Admitting: Family Medicine

## 2015-09-29 ENCOUNTER — Other Ambulatory Visit: Payer: Self-pay | Admitting: Family Medicine

## 2015-10-15 ENCOUNTER — Ambulatory Visit (INDEPENDENT_AMBULATORY_CARE_PROVIDER_SITE_OTHER): Payer: BLUE CROSS/BLUE SHIELD | Admitting: Family Medicine

## 2015-10-15 ENCOUNTER — Encounter: Payer: Self-pay | Admitting: Family Medicine

## 2015-10-15 VITALS — BP 145/90 | HR 88 | Temp 99.2°F | Wt 187.0 lb

## 2015-10-15 DIAGNOSIS — A499 Bacterial infection, unspecified: Secondary | ICD-10-CM

## 2015-10-15 DIAGNOSIS — B9689 Other specified bacterial agents as the cause of diseases classified elsewhere: Secondary | ICD-10-CM

## 2015-10-15 DIAGNOSIS — J329 Chronic sinusitis, unspecified: Secondary | ICD-10-CM

## 2015-10-15 MED ORDER — AMOXICILLIN-POT CLAVULANATE 500-125 MG PO TABS: ORAL_TABLET | ORAL | Status: AC

## 2015-10-15 MED ORDER — HYDROCODONE-HOMATROPINE 5-1.5 MG/5ML PO SYRP: 5.0000 mL | ORAL_SOLUTION | Freq: Three times a day (TID) | ORAL | Status: DC | PRN

## 2015-10-15 NOTE — Progress Notes (Signed)
CC: Victor Price is a 65 y.o. male is here for Sinusitis   Subjective: HPI:  Nasal congestion, postnasal drip nonproductive cough has been present for the past week. Slowly worsening. Interfering with sleep now. No benefit from Robitussin. No other interventions as of yet. Denies fevers, chills, wheezing or shortness of breath. Denies difficulty swallowing or change in voice. No rash or headache   Review Of Systems Outlined In HPI  Past Medical History  Diagnosis Date  . Pneumonia     gets recurrent PNA in left lung    No past surgical history on file. No family history on file.  Social History   Social History  . Marital Status: Single    Spouse Name: N/A  . Number of Children: N/A  . Years of Education: N/A   Occupational History  . [employer]    Social History Main Topics  . Smoking status: Never Smoker   . Smokeless tobacco: Not on file  . Alcohol Use: No  . Drug Use: No  . Sexual Activity: Not on file   Other Topics Concern  . Not on file   Social History Narrative     Objective: BP 145/90 mmHg  Pulse 88  Temp(Src) 99.2 F (37.3 C) (Oral)  Wt 187 lb (84.823 kg)  SpO2 96%  General: Alert and Oriented, No Acute Distress HEENT: Pupils equal, round, reactive to light. Conjunctivae clear.  External ears unremarkable, canals clear with intact TMs with appropriate landmarks.  Middle ear appears open without effusion. Pink inferior turbinates.  Moist mucous membranes, pharynx without inflammation nor lesionsOther than postnasal drip.  Neck supple without palpable lymphadenopathy nor abnormal masses. Lungs: Clear to auscultation bilaterally, no wheezing/ronchi/rales.  Comfortable work of breathing. Good air movement.  Extremities: No peripheral edema.  Strong peripheral pulses.  Mental Status: No depression, anxiety, nor agitation. Skin: Warm and dry.  Assessment & Plan: [name] was seen today for sinusitis.  Diagnoses and all orders for this visit:  Bacterial  sinusitis -     amoxicillin-clavulanate (AUGMENTIN) 500-125 MG tablet; Take one by mouth every 8 hours for ten total days. -     HYDROcodone-homatropine (HYCODAN) 5-1.5 MG/5ML syrup; Take 5 mLs by mouth every 8 (eight) hours as needed for cough.   Start the above regimen with consideration of using nasal saline washes on a when necessary basis.Signs and symptoms requring emergent/urgent reevaluation were discussed with the patient.   No Follow-up on file.

## 2015-11-25 ENCOUNTER — Other Ambulatory Visit: Payer: Self-pay | Admitting: Family Medicine

## 2015-12-02 ENCOUNTER — Telehealth: Payer: Self-pay | Admitting: Family Medicine

## 2015-12-02 NOTE — Telephone Encounter (Signed)
I called pt to let him know that he is due for a f/u on his BP and he states that he will call back and schedule appt time

## 2015-12-19 ENCOUNTER — Other Ambulatory Visit: Payer: Self-pay

## 2016-01-08 ENCOUNTER — Other Ambulatory Visit: Payer: Self-pay | Admitting: Family Medicine

## 2016-01-14 ENCOUNTER — Telehealth: Payer: Self-pay | Admitting: Family Medicine

## 2016-01-14 NOTE — Telephone Encounter (Signed)
I called pt and left a message telling him to call our office and schedule a appt for a f/u on BP

## 2016-02-06 ENCOUNTER — Ambulatory Visit (INDEPENDENT_AMBULATORY_CARE_PROVIDER_SITE_OTHER): Payer: BLUE CROSS/BLUE SHIELD | Admitting: Family Medicine

## 2016-02-06 ENCOUNTER — Encounter: Payer: Self-pay | Admitting: Family Medicine

## 2016-02-06 VITALS — BP 121/79 | HR 63 | Wt 173.0 lb

## 2016-02-06 DIAGNOSIS — J302 Other seasonal allergic rhinitis: Secondary | ICD-10-CM

## 2016-02-06 DIAGNOSIS — I10 Essential (primary) hypertension: Secondary | ICD-10-CM | POA: Diagnosis not present

## 2016-02-06 DIAGNOSIS — M2550 Pain in unspecified joint: Secondary | ICD-10-CM

## 2016-02-06 DIAGNOSIS — G4700 Insomnia, unspecified: Secondary | ICD-10-CM | POA: Diagnosis not present

## 2016-02-06 DIAGNOSIS — E785 Hyperlipidemia, unspecified: Secondary | ICD-10-CM | POA: Diagnosis not present

## 2016-02-06 DIAGNOSIS — N400 Enlarged prostate without lower urinary tract symptoms: Secondary | ICD-10-CM

## 2016-02-06 DIAGNOSIS — H9313 Tinnitus, bilateral: Secondary | ICD-10-CM

## 2016-02-06 DIAGNOSIS — Z1159 Encounter for screening for other viral diseases: Secondary | ICD-10-CM

## 2016-02-06 DIAGNOSIS — N182 Chronic kidney disease, stage 2 (mild): Secondary | ICD-10-CM

## 2016-02-06 DIAGNOSIS — E041 Nontoxic single thyroid nodule: Secondary | ICD-10-CM

## 2016-02-06 MED ORDER — BISOPROLOL-HYDROCHLOROTHIAZIDE 5-6.25 MG PO TABS: 1.0000 | ORAL_TABLET | Freq: Every day | ORAL | Status: DC

## 2016-02-06 MED ORDER — LORATADINE-PSEUDOEPHEDRINE ER 10-240 MG PO TB24: 1.0000 | ORAL_TABLET | Freq: Every day | ORAL | Status: DC

## 2016-02-06 NOTE — Progress Notes (Addendum)
Subjective:    CC:  HTN  HPI:  Hypertension- Pt denies chest pain, SOB, dizziness, or heart palpitations.  Taking meds as directed w/o problems.  Denies medication side effects.    Hyperlipidemia - Currently on Crestor 10 mg.No myalgias or side effects.  Insomnia-doing well on Lunesta. No side effects.  GERD-he is just taking his Prevacid more as needed.  Allergic rhinitis-he still takes Claritin-D. He didn't take it for the last 2 days to just make sure his blood pressure looks good. He would like a prescription so he doesn't have to continue to show his license for at the pharmacy.  He also complains about bilateral ear ringing. He says it's about the same in both ears. Has been going on for years but it's been getting a little bit worse over the last year. His last ENT exam was years ago.  He has been expressing more joint pain recently. He's noticed more stiffness particularly in the mornings. He says Tylenol does seem to help. He denies any actual swelling in the joints.  Past medical history, Surgical history, Family history not pertinant except as noted below, Social history, Allergies, and medications have been entered into the medical record, reviewed, and corrections made.   Review of Systems: No fevers, chills, night sweats, weight loss, chest pain, or shortness of breath.   Objective:    General: Well Developed, well nourished, and in no acute distress.  Neuro: Alert and oriented x3, extra-ocular muscles intact, sensation grossly intact.  HEENT: Normocephalic, atraumatic Oropharynx is clear, TMs and canals are clear as well. No significant cervical lymphadenopathy. Skin: Warm and dry, no rashes. Cardiac: Regular rate and rhythm, no murmurs rubs or gallops, no lower extremity edema. No carotid bruits. Respiratory: Clear to auscultation bilaterally. Not using accessory muscles, speaking in full sentences.   Impression and Recommendations:   Hypertension-well-controlled.  Continue current regimen. Follow-up in 6 months.    Hyperlipidemia - continue Crestor. Due for CMP and fasting lipid panel. Next  Allergic rhinitis-prescription sent for Claritin-D. Next  Due for hepatitis C screening as well as PSA for prostate cancer screening.  Bilateral tinnitus-consider referral back to ENT for further evaluation. Though explained to him that this is a chronic problem and often there are no significant treatments.  Polyarthralgia-most likely related to osteoarthritis. He is not expressing any actual joint swelling. We'll get some up-to-date blood work. Since Tylenol seems to be helpful continue to use that as needed and call if feels like it's getting worse.  Insomnia-continue Lunesta.

## 2016-02-06 NOTE — Addendum Note (Signed)
Addended by: Beatrice Lecher D on: 02/06/2016 05:43 PM   Modules accepted: Miquel Dunn

## 2016-02-13 ENCOUNTER — Ambulatory Visit: Payer: Self-pay | Admitting: Family Medicine

## 2016-04-27 ENCOUNTER — Other Ambulatory Visit: Payer: Self-pay | Admitting: Family Medicine

## 2016-05-01 DIAGNOSIS — Z23 Encounter for immunization: Secondary | ICD-10-CM | POA: Diagnosis not present
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
## 2016-06-14 ENCOUNTER — Other Ambulatory Visit: Payer: Self-pay | Admitting: Family Medicine

## 2016-06-15 NOTE — Telephone Encounter (Signed)
Pt has not had labs. Refill sent for 30 days

## 2016-06-30 ENCOUNTER — Other Ambulatory Visit: Payer: Self-pay | Admitting: Family Medicine

## 2016-07-20 ENCOUNTER — Encounter: Payer: Self-pay | Admitting: Sports Medicine

## 2016-07-20 ENCOUNTER — Ambulatory Visit (INDEPENDENT_AMBULATORY_CARE_PROVIDER_SITE_OTHER): Payer: BLUE CROSS/BLUE SHIELD | Admitting: Sports Medicine

## 2016-07-20 ENCOUNTER — Ambulatory Visit (INDEPENDENT_AMBULATORY_CARE_PROVIDER_SITE_OTHER): Payer: BLUE CROSS/BLUE SHIELD

## 2016-07-20 ENCOUNTER — Ambulatory Visit: Payer: Self-pay | Admitting: Family Medicine

## 2016-07-20 DIAGNOSIS — I700 Atherosclerosis of aorta: Secondary | ICD-10-CM | POA: Diagnosis not present

## 2016-07-20 DIAGNOSIS — J181 Lobar pneumonia, unspecified organism: Secondary | ICD-10-CM

## 2016-07-20 DIAGNOSIS — J189 Pneumonia, unspecified organism: Secondary | ICD-10-CM

## 2016-07-20 DIAGNOSIS — J479 Bronchiectasis, uncomplicated: Secondary | ICD-10-CM | POA: Insufficient documentation

## 2016-07-20 DIAGNOSIS — J471 Bronchiectasis with (acute) exacerbation: Secondary | ICD-10-CM | POA: Insufficient documentation

## 2016-07-20 LAB — CBC WITH DIFFERENTIAL/PLATELET
Basophils Absolute: 0 {cells}/uL (ref 0–200)
Basophils Relative: 0 %
Eosinophils Absolute: 141 {cells}/uL (ref 15–500)
Eosinophils Relative: 1 %
HCT: 44.1 % (ref 38.5–50.0)
Hemoglobin: 14.6 g/dL (ref 13.2–17.1)
Lymphocytes Relative: 11 %
Lymphs Abs: 1551 {cells}/uL (ref 850–3900)
MCH: 30.2 pg (ref 27.0–33.0)
MCHC: 33.1 g/dL (ref 32.0–36.0)
MCV: 91.1 fL (ref 80.0–100.0)
MPV: 10.1 fL (ref 7.5–12.5)
Monocytes Absolute: 1551 cells/uL — ABNORMAL HIGH (ref 200–950)
Monocytes Relative: 11 %
Neutro Abs: 10857 {cells}/uL — ABNORMAL HIGH (ref 1500–7800)
Neutrophils Relative %: 77 %
Platelets: 292 10*3/uL (ref 140–400)
RBC: 4.84 MIL/uL (ref 4.20–5.80)
RDW: 14.2 % (ref 11.0–15.0)
WBC: 14.1 10*3/uL — ABNORMAL HIGH (ref 3.8–10.8)

## 2016-07-20 MED ORDER — HYDROCOD POLST-CPM POLST ER 10-8 MG/5ML PO SUER: 5.0000 mL | Freq: Two times a day (BID) | ORAL | 0 refills | Status: DC | PRN

## 2016-07-20 MED ORDER — AZITHROMYCIN 250 MG PO TABS: ORAL_TABLET | ORAL | 0 refills | Status: DC

## 2016-07-20 MED ORDER — CIPROFLOXACIN HCL 750 MG PO TABS: 750.0000 mg | ORAL_TABLET | Freq: Two times a day (BID) | ORAL | 0 refills | Status: AC

## 2016-07-20 MED ORDER — PREDNISONE 50 MG PO TABS: 50.0000 mg | ORAL_TABLET | Freq: Every day | ORAL | 0 refills | Status: DC

## 2016-07-20 NOTE — Progress Notes (Signed)
  Subjective:    CC: Coughing  HPI: This is a pleasant 65 year old male with known pneumonia, he tells me he has had it approximately 8 times in the past. He is now having a recurrence of fevers, chills, minimal shortness of breath, cough, he feels as though this resembles his previous episodes of pneumonia. On further questioning he has not fathered any children, and he does have a chest CT from 2008 that showed some degree of bronchiectasis. Has never been tested for cystic fibrosis, or immunodeficiency.  Past medical history:  Negative.  See flowsheet/record as well for more information.  Surgical history: Negative.  See flowsheet/record as well for more information.  Family history: Negative.  See flowsheet/record as well for more information.  Social history: Negative.  See flowsheet/record as well for more information.  Allergies, and medications have been entered into the medical record, reviewed, and no changes needed.   Review of Systems: No fevers, chills, night sweats, weight loss, chest pain, or shortness of breath.   Objective:    General: Well Developed, well nourished, and in no acute distress.  Neuro: Alert and oriented x3, extra-ocular muscles intact, sensation grossly intact.  HEENT: Normocephalic, atraumatic, pupils equal round reactive to light, neck supple, no masses, no lymphadenopathy, thyroid nonpalpable.  Skin: Warm and dry, no rashes. Cardiac: Regular rate and rhythm, no murmurs rubs or gallops, no lower extremity edema.  Respiratory: Coarse sounds throughout. Not using accessory muscles, speaking in full sentences.  X-ray show increased markings in the left lower lobe  Impression and Recommendations:    Recurrent pneumonia We will treat this aggressively with azithromycin, prednisone, Tussionex, chest x-ray.  Because of this history of bronchiectasis we are also point to use ciprofloxacin which would cover Pseudomonas as well.  He does have a history of  bronchiectasis and a chest CT in 2008, this can be seen in cystic fibrosis, because of this I'm going to do cystic fibrosis gene mutation testing, as well as immune globulin testing. In addition he has never fathered children, infertility in men can occur with cystic fibrosis.  He has had pre-and postbronchodilator spirometry the past that was negative.

## 2016-07-20 NOTE — Assessment & Plan Note (Addendum)
We will treat this aggressively with azithromycin, prednisone, Tussionex, chest x-ray.  Because of this history of bronchiectasis we are also point to use ciprofloxacin which would cover Pseudomonas as well.  He does have a history of bronchiectasis and a chest CT in 2008, this can be seen in cystic fibrosis, because of this I'm going to do cystic fibrosis gene mutation testing, as well as immune globulin testing. In addition he has never fathered children, infertility in men can occur with cystic fibrosis.  He has had pre-and postbronchodilator spirometry the past that was negative.

## 2016-07-20 NOTE — Progress Notes (Deleted)
   Subjective:    Patient ID: Victor Price, male    DOB: 08/24/1950, 65 y.o.   MRN: [id number]  HPI 65 year old male comes in today complaining of cough and chest congestion. Typically when he gets sick he gets very severe wheezing in the lungs. And typically gets sick very quickly. He's had a bad taste to the drainage in his throat. No fevers chills or sweats.   Review of Systems     Objective:   Physical Exam  Constitutional: He is oriented to person, place, and time. He appears well-developed and well-nourished.  HENT:  Head: Normocephalic and atraumatic.  Right Ear: External ear normal.  Left Ear: External ear normal.  Nose: Nose normal.  Mouth/Throat: Oropharynx is clear and moist.  TMs and canals are clear.   Eyes: Conjunctivae and EOM are normal. Pupils are equal, round, and reactive to light.  Neck: Neck supple. No thyromegaly present.  Cardiovascular: Normal rate and normal heart sounds.   Pulmonary/Chest: Effort normal. He has wheezes.  Diffuse wheezing and rhonchi.  Lymphadenopathy:    He has no cervical adenopathy.  Neurological: He is alert and oriented to person, place, and time.  Skin: Skin is warm and dry.  Psychiatric: He has a normal mood and affect.          Assessment & Plan:

## 2016-07-21 LAB — IGG, IGA, IGM
IgA: 274 mg/dL (ref 81–463)
IgG (Immunoglobin G), Serum: 1272 mg/dL (ref 694–1618)
IgM, Serum: 148 mg/dL (ref 48–271)

## 2016-07-21 LAB — COMPREHENSIVE METABOLIC PANEL WITH GFR
ALT: 14 U/L (ref 9–46)
Albumin: 4.1 g/dL (ref 3.6–5.1)
Alkaline Phosphatase: 67 U/L (ref 40–115)
Chloride: 103 mmol/L (ref 98–110)
Creat: 1.8 mg/dL — ABNORMAL HIGH (ref 0.70–1.25)
Potassium: 4.6 mmol/L (ref 3.5–5.3)
Sodium: 140 mmol/L (ref 135–146)
Total Protein: 6.8 g/dL (ref 6.1–8.1)

## 2016-07-21 LAB — COMPREHENSIVE METABOLIC PANEL
AST: 15 U/L (ref 10–35)
BUN: 26 mg/dL — ABNORMAL HIGH (ref 7–25)
CO2: 26 mmol/L (ref 20–31)
Calcium: 9.7 mg/dL (ref 8.6–10.3)
Glucose, Bld: 88 mg/dL (ref 65–99)
Total Bilirubin: 0.8 mg/dL (ref 0.2–1.2)

## 2016-07-22 ENCOUNTER — Telehealth: Payer: Self-pay | Admitting: Family Medicine

## 2016-07-22 NOTE — Telephone Encounter (Signed)
Immunization documented.Victor Price

## 2016-07-22 NOTE — Telephone Encounter (Signed)
Had flu shot back in Sept. 2017 at [facility] on [address]per [employer]

## 2016-07-23 ENCOUNTER — Other Ambulatory Visit: Payer: Self-pay | Admitting: Family Medicine

## 2016-07-24 ENCOUNTER — Encounter: Payer: Self-pay | Admitting: Sports Medicine

## 2016-07-26 LAB — CYSTIC FIBROSIS DIAGNOSTIC STUDY

## 2016-07-27 ENCOUNTER — Other Ambulatory Visit: Payer: Self-pay | Admitting: Family Medicine

## 2016-08-06 ENCOUNTER — Other Ambulatory Visit: Payer: Self-pay | Admitting: Family Medicine

## 2016-09-09 ENCOUNTER — Other Ambulatory Visit: Payer: Self-pay | Admitting: *Deleted

## 2016-09-27 ENCOUNTER — Other Ambulatory Visit: Payer: Self-pay | Admitting: Family Medicine

## 2016-09-30 ENCOUNTER — Other Ambulatory Visit: Payer: Self-pay | Admitting: Family Medicine

## 2016-10-04 ENCOUNTER — Other Ambulatory Visit: Payer: Self-pay | Admitting: Family Medicine

## 2016-10-08 ENCOUNTER — Other Ambulatory Visit: Payer: Self-pay | Admitting: Family Medicine

## 2016-10-11 ENCOUNTER — Telehealth: Payer: Self-pay | Admitting: Emergency Medicine

## 2016-10-11 MED ORDER — ROSUVASTATIN CALCIUM 10 MG PO TABS: 10.0000 mg | ORAL_TABLET | Freq: Every day | ORAL | 0 refills | Status: DC

## 2016-10-11 NOTE — Telephone Encounter (Signed)
OK to refill

## 2016-10-11 NOTE — Telephone Encounter (Signed)
Medication sent.

## 2016-10-11 NOTE — Telephone Encounter (Signed)
Patient calling from out of town to request rx refill on his 'cholesterol' medication; he knows he needs labs done but is only in town Sat/Sun until May, 2018. Hoping he can have refill until then. His choice of pharmacy is [facility] on [address].

## 2016-10-20 ENCOUNTER — Telehealth: Payer: Self-pay | Admitting: Family Medicine

## 2016-10-20 NOTE — Telephone Encounter (Signed)
Pt states he will call back and schedule his f/u appt with Dr.[name] for BP and labs

## 2016-11-13 ENCOUNTER — Other Ambulatory Visit: Payer: Self-pay | Admitting: Family Medicine

## 2016-12-11 ENCOUNTER — Other Ambulatory Visit: Payer: Self-pay | Admitting: Family Medicine

## 2016-12-19 ENCOUNTER — Other Ambulatory Visit: Payer: Self-pay | Admitting: Sports Medicine

## 2017-01-30 ENCOUNTER — Other Ambulatory Visit: Payer: Self-pay | Admitting: Family Medicine

## 2017-02-02 ENCOUNTER — Telehealth: Payer: Self-pay | Admitting: Family Medicine

## 2017-02-02 DIAGNOSIS — N400 Enlarged prostate without lower urinary tract symptoms: Secondary | ICD-10-CM

## 2017-02-02 DIAGNOSIS — E785 Hyperlipidemia, unspecified: Secondary | ICD-10-CM

## 2017-02-02 DIAGNOSIS — I10 Essential (primary) hypertension: Secondary | ICD-10-CM

## 2017-02-02 NOTE — Telephone Encounter (Signed)
[facility] for CMP, lipid, PSA

## 2017-02-02 NOTE — Telephone Encounter (Signed)
Pt called requesting labs. He stated he will be scheduling an appointment with you to request a refill on his meds once you get the results of his labs. Let me know the order is ready, and I'll give him a call. Thanks!

## 2017-02-04 ENCOUNTER — Other Ambulatory Visit: Payer: Self-pay | Admitting: *Deleted

## 2017-02-04 DIAGNOSIS — E785 Hyperlipidemia, unspecified: Secondary | ICD-10-CM

## 2017-02-04 DIAGNOSIS — I10 Essential (primary) hypertension: Secondary | ICD-10-CM

## 2017-02-04 DIAGNOSIS — N400 Enlarged prostate without lower urinary tract symptoms: Secondary | ICD-10-CM

## 2017-02-04 NOTE — Telephone Encounter (Signed)
Labs ordered.

## 2017-02-07 DIAGNOSIS — N400 Enlarged prostate without lower urinary tract symptoms: Secondary | ICD-10-CM | POA: Diagnosis not present

## 2017-02-07 DIAGNOSIS — I10 Essential (primary) hypertension: Secondary | ICD-10-CM | POA: Diagnosis not present

## 2017-02-07 DIAGNOSIS — E785 Hyperlipidemia, unspecified: Secondary | ICD-10-CM | POA: Diagnosis not present

## 2017-02-08 ENCOUNTER — Encounter: Payer: Self-pay | Admitting: Family Medicine

## 2017-02-08 ENCOUNTER — Other Ambulatory Visit: Payer: Self-pay | Admitting: Family Medicine

## 2017-02-08 DIAGNOSIS — N1832 Chronic kidney disease, stage 3b: Secondary | ICD-10-CM | POA: Insufficient documentation

## 2017-02-08 DIAGNOSIS — N183 Chronic kidney disease, stage 3 unspecified: Secondary | ICD-10-CM | POA: Insufficient documentation

## 2017-02-08 LAB — COMPLETE METABOLIC PANEL WITH GFR
ALT: 23 U/L (ref 9–46)
AST: 17 U/L (ref 10–35)
Albumin: 4.2 g/dL (ref 3.6–5.1)
Alkaline Phosphatase: 56 U/L (ref 40–115)
BUN: 22 mg/dL (ref 7–25)
CALCIUM: 9.7 mg/dL (ref 8.6–10.3)
CHLORIDE: 104 mmol/L (ref 98–110)
CO2: 25 mmol/L (ref 20–31)
CREATININE: 1.43 mg/dL — AB (ref 0.70–1.25)
GFR, EST AFRICAN AMERICAN: 59 mL/min — AB (ref >=60)
GFR, Est Non African American: 51 mL/min — ABNORMAL LOW (ref >=60)
Glucose, Bld: 96 mg/dL (ref 65–99)
POTASSIUM: 4.7 mmol/L (ref 3.5–5.3)
Sodium: 137 mmol/L (ref 135–146)
Total Bilirubin: 1.1 mg/dL (ref 0.2–1.2)
Total Protein: 7 g/dL (ref 6.1–8.1)

## 2017-02-08 LAB — LIPID PANEL W/REFLEX DIRECT LDL
CHOL/HDL RATIO: 5.2 ratio — AB (ref <5.0)
Cholesterol: 274 mg/dL — ABNORMAL HIGH (ref <200)
HDL: 53 mg/dL (ref >40)
LDL-CHOLESTEROL: 190 mg/dL — AB
NON-HDL CHOLESTEROL (CALC): 221 mg/dL — AB (ref <130)
Triglycerides: 148 mg/dL (ref <150)

## 2017-02-08 LAB — PSA: PSA: 1.4 ng/mL (ref <=4.0)

## 2017-02-08 MED ORDER — ROSUVASTATIN CALCIUM 10 MG PO TABS: 10.0000 mg | ORAL_TABLET | Freq: Every day | ORAL | 3 refills | Status: DC

## 2017-02-16 ENCOUNTER — Other Ambulatory Visit: Payer: Self-pay | Admitting: Family Medicine

## 2017-02-18 ENCOUNTER — Encounter: Payer: Self-pay | Admitting: Sports Medicine

## 2017-02-18 ENCOUNTER — Ambulatory Visit (INDEPENDENT_AMBULATORY_CARE_PROVIDER_SITE_OTHER): Payer: BLUE CROSS/BLUE SHIELD | Admitting: Sports Medicine

## 2017-02-18 ENCOUNTER — Ambulatory Visit (INDEPENDENT_AMBULATORY_CARE_PROVIDER_SITE_OTHER): Payer: BLUE CROSS/BLUE SHIELD

## 2017-02-18 DIAGNOSIS — J470 Bronchiectasis with acute lower respiratory infection: Secondary | ICD-10-CM | POA: Diagnosis not present

## 2017-02-18 DIAGNOSIS — R05 Cough: Secondary | ICD-10-CM

## 2017-02-18 DIAGNOSIS — R0602 Shortness of breath: Secondary | ICD-10-CM | POA: Diagnosis not present

## 2017-02-18 MED ORDER — AZITHROMYCIN 250 MG PO TABS: ORAL_TABLET | ORAL | 0 refills | Status: DC

## 2017-02-18 MED ORDER — CIPROFLOXACIN HCL 750 MG PO TABS: 750.0000 mg | ORAL_TABLET | Freq: Two times a day (BID) | ORAL | 0 refills | Status: AC

## 2017-02-18 NOTE — Progress Notes (Signed)
  Subjective:    CC: Pneumonia  HPI: This is a pleasant 66 year old male, back into thousand 8 he did have some bronchiectasis on a CT, he's had recurrent pneumonia, left lower lobe, almost 20 times. He's been to multiple providers including pulmonologists, he had multiple testing done including cystic fibrosis testing, immunoglobulins, everything has been normal. He simply has recurrent pneumonia. For the past several days he's had cough, malaise. He tells me he does not desire that we reinvent the wheel today, just treat him for pneumonia. No fevers, chills, no shortness of breath.  Past medical history:  Negative.  See flowsheet/record as well for more information.  Surgical history: Negative.  See flowsheet/record as well for more information.  Family history: Negative.  See flowsheet/record as well for more information.  Social history: Negative.  See flowsheet/record as well for more information.  Allergies, and medications have been entered into the medical record, reviewed, and no changes needed.   Review of Systems: No fevers, chills, night sweats, weight loss, chest pain, or shortness of breath.   Objective:    General: Well Developed, well nourished, and in no acute distress.  Neuro: Alert and oriented x3, extra-ocular muscles intact, sensation grossly intact.  HEENT: Normocephalic, atraumatic, pupils equal round reactive to light, neck supple, no masses, no lymphadenopathy, thyroid nonpalpable.  Skin: Warm and dry, no rashes. Cardiac: Regular rate and rhythm, no murmurs rubs or gallops, no lower extremity edema.  Respiratory: Extremely coarse sounds in the left lower lobe. Not using accessory muscles, speaking in full sentences.  Impression and Recommendations:    Bronchiectasis ([place]) Negative cystic fibrosis testing, advanced imaging was last done 10 years ago. Chest x-ray, azithromycin, ciprofloxacin. Sputum culture today. Return to see me in one month, we will consider  repeat advanced imaging. He has been to multiple pulmonologists without an answer.  I spent 25 minutes with this patient, greater than 50% was face-to-face time counseling regarding the above diagnoses

## 2017-02-18 NOTE — Assessment & Plan Note (Signed)
Negative cystic fibrosis testing, advanced imaging was last done 10 years ago. Chest x-ray, azithromycin, ciprofloxacin. Sputum culture today. Return to see me in one month, we will consider repeat advanced imaging. He has been to multiple pulmonologists without an answer.

## 2017-02-21 ENCOUNTER — Other Ambulatory Visit: Payer: Self-pay | Admitting: Sports Medicine

## 2017-02-21 LAB — RESPIRATORY CULTURE OR RESPIRATORY AND SPUTUM CULTURE

## 2017-02-21 MED ORDER — HYDROCOD POLST-CPM POLST ER 10-8 MG/5ML PO SUER: 5.0000 mL | Freq: Two times a day (BID) | ORAL | 0 refills | Status: DC | PRN

## 2017-02-23 ENCOUNTER — Ambulatory Visit: Payer: Self-pay | Admitting: Family Medicine

## 2017-03-03 ENCOUNTER — Other Ambulatory Visit: Payer: Self-pay | Admitting: Family Medicine

## 2017-03-18 ENCOUNTER — Telehealth: Payer: Self-pay

## 2017-03-18 MED ORDER — BISOPROLOL-HYDROCHLOROTHIAZIDE 5-6.25 MG PO TABS: 1.0000 | ORAL_TABLET | Freq: Every day | ORAL | 1 refills | Status: DC

## 2017-03-18 NOTE — Telephone Encounter (Signed)
Patient request a refill for Ziac 12-6023 mg. Patient has not been seen since 02/06/16 by  PCP but was in the office of on 02/18/2017 and his blood pressure was 136/74 at that visit. Patient stated that he is out of town for 2 months and that's when he could come in for a follow up with PCP.  So I sent in a 2 month supply and patient verbally understood that he must schedule appointment in 2 months. Amaani Guilbault,CMA

## 2017-05-05 ENCOUNTER — Other Ambulatory Visit: Payer: Self-pay | Admitting: Family Medicine

## 2017-05-05 DIAGNOSIS — Z23 Encounter for immunization: Secondary | ICD-10-CM | POA: Diagnosis not present

## 2017-05-20 ENCOUNTER — Other Ambulatory Visit: Payer: Self-pay | Admitting: Family Medicine

## 2017-05-30 ENCOUNTER — Telehealth: Payer: Self-pay | Admitting: Family Medicine

## 2017-05-30 NOTE — Telephone Encounter (Signed)
Patient called inquiring as to why Dr. Madilyn Fireman was not refilling his blood pressure medicine. I told pt that he had not had a BP follow up appointment with his PCP since June 2016 and would need to schedule a follow up to have his meds refilled. He stated that he had been seen by Dr. Dianah Field since June of 2016. I told him that appointment was for a separate issue and not for BP followup. Pt stated that he did not see the importance of driving up from [place] where he works just to have someone look at him and tell him he is fine in order to get his meds. I offered to schedule an appointment for him. He declined and told me to tell Dr. Madilyn Fireman to refill his meds. Please advise patient. Thanks so much!

## 2017-05-31 ENCOUNTER — Other Ambulatory Visit: Payer: Self-pay

## 2017-05-31 MED ORDER — BISOPROLOL-HYDROCHLOROTHIAZIDE 5-6.25 MG PO TABS: 1.0000 | ORAL_TABLET | Freq: Every day | ORAL | 0 refills | Status: DC

## 2017-05-31 NOTE — Telephone Encounter (Signed)
Pt has been advised of why an appointment is required. Follow up made. One week of BP Rx sent to last until then.

## 2017-06-02 ENCOUNTER — Ambulatory Visit (INDEPENDENT_AMBULATORY_CARE_PROVIDER_SITE_OTHER): Payer: BLUE CROSS/BLUE SHIELD | Admitting: Family Medicine

## 2017-06-02 ENCOUNTER — Encounter: Payer: Self-pay | Admitting: Family Medicine

## 2017-06-02 VITALS — BP 138/72 | HR 79 | Wt 184.0 lb

## 2017-06-02 DIAGNOSIS — N183 Chronic kidney disease, stage 3 unspecified: Secondary | ICD-10-CM

## 2017-06-02 DIAGNOSIS — I10 Essential (primary) hypertension: Secondary | ICD-10-CM | POA: Diagnosis not present

## 2017-06-02 DIAGNOSIS — F5101 Primary insomnia: Secondary | ICD-10-CM

## 2017-06-02 DIAGNOSIS — B351 Tinea unguium: Secondary | ICD-10-CM | POA: Diagnosis not present

## 2017-06-02 MED ORDER — MUPIROCIN 2 % EX OINT: TOPICAL_OINTMENT | Freq: Three times a day (TID) | CUTANEOUS | 99 refills | Status: DC

## 2017-06-02 MED ORDER — BISOPROLOL-HYDROCHLOROTHIAZIDE 5-6.25 MG PO TABS: 1.0000 | ORAL_TABLET | Freq: Every day | ORAL | 2 refills | Status: DC

## 2017-06-02 MED ORDER — EFINACONAZOLE 10 % EX SOLN: 1.0000 "application " | Freq: Every day | CUTANEOUS | 1 refills | Status: DC

## 2017-06-02 MED ORDER — LORATADINE-PSEUDOEPHEDRINE ER 10-240 MG PO TB24: 1.0000 | ORAL_TABLET | Freq: Every day | ORAL | 4 refills | Status: DC

## 2017-06-02 NOTE — Progress Notes (Addendum)
Subjective:    CC: BP, sleep med HPI:  Hypertension- Pt denies chest pain, SOB, dizziness, or heart palpitations.  Taking meds as directed w/o problems.  Denies medication side effects.    F/U insomnia -he is currently on trazodone 50 mg at bedtime daily.  Usually takes 100 mg.  He also wants me to look at the left great toe.  He gets a very superficial fungus on the edge of the nail.  It is the only nail that it affects.  He tried several over-the-counter remedies without any significant improvement or relief.  CKD 3-renal function has been stable over the last couple of years.  Except if he takes too much Claritin-D that will have difficulty urinating.  Past medical history, Surgical history, Family history not pertinant except as noted below, Social history, Allergies, and medications have been entered into the medical record, reviewed, and corrections made.   Review of Systems: No fevers, chills, night sweats, weight loss, chest pain, or shortness of breath.   Objective:    General: Well Developed, well nourished, and in no acute distress.  Neuro: Alert and oriented x3, extra-ocular muscles intact, sensation grossly intact.  HEENT: Normocephalic, atraumatic  Skin: Warm and dry, no rashes.  Along the lateral border of the left great toenail he has a flaky white debris on the surface of the nail.   Cardiac: Regular rate and rhythm, no murmurs rubs or gallops, no lower extremity edema.  Respiratory: Clear to auscultation bilaterally. Not using accessory muscles, speaking in full sentences.   Impression and Recommendations:    HTN - Well controlled. Continue current regimen. Follow up in  6 months.   Insomnia -stable.  Continue current regimen.  Medication refill should be up-to-date.  Left great toenail fungus -try prescribing Jublia.  If not covered then will switch to Penlac.  Also discussed that there is some data that Vicks VapoRub over-the-counter can work as well.  AR-would  like his prescription for Claritin-D printed.  CKD 3-a long discussion today about renal impairment.  Overall his renal function has been stable for about 5 years.  At some point I really would like to get an ultrasound for further evaluation.  He declines to do it right now but will think about it.  We also discussed drinking plenty of fluid and avoiding NSAIDs on a regular basis.  He says he does avoid soda.

## 2017-06-21 ENCOUNTER — Ambulatory Visit: Payer: BLUE CROSS/BLUE SHIELD | Admitting: Family Medicine

## 2017-06-21 ENCOUNTER — Encounter: Payer: Self-pay | Admitting: Family Medicine

## 2017-06-21 VITALS — BP 155/88 | HR 111 | Temp 102.0°F | Ht 72.0 in | Wt 183.0 lb

## 2017-06-21 DIAGNOSIS — J181 Lobar pneumonia, unspecified organism: Secondary | ICD-10-CM

## 2017-06-21 DIAGNOSIS — I10 Essential (primary) hypertension: Secondary | ICD-10-CM | POA: Diagnosis not present

## 2017-06-21 DIAGNOSIS — J189 Pneumonia, unspecified organism: Secondary | ICD-10-CM

## 2017-06-21 DIAGNOSIS — R509 Fever, unspecified: Secondary | ICD-10-CM | POA: Diagnosis not present

## 2017-06-21 LAB — POCT INFLUENZA A/B
INFLUENZA B, POC: NEGATIVE
Influenza A, POC: NEGATIVE

## 2017-06-21 MED ORDER — PREDNISONE 20 MG PO TABS: 40.0000 mg | ORAL_TABLET | Freq: Every day | ORAL | 0 refills | Status: DC

## 2017-06-21 MED ORDER — AZITHROMYCIN 250 MG PO TABS: ORAL_TABLET | ORAL | 0 refills | Status: AC

## 2017-06-21 MED ORDER — CEFTRIAXONE SODIUM 1 G IJ SOLR
1.0000 g | Freq: Once | INTRAMUSCULAR | Status: AC
  Administered 2017-06-21: 1 g via INTRAMUSCULAR

## 2017-06-21 NOTE — Progress Notes (Signed)
Subjective:    Patient ID: Victor Price, male    DOB: 1951/02/27, 66 y.o.   MRN: [id number]  HPI 66 year old male with a prior history of multiple pneumonias comes in today complaining of cough. Fever to 102.  Took Tylenol 10:30 AM this morning . Slight HA.  No nausea/vomiting.   He is worried that he may have pneumonia.  He is seen by pulmonology in the past and they have ruled out cystic fibrosis etc.  Last chest x-ray was in July when he was having symptoms and chest x-ray was negative at that time except for some scarring at the left base.   Review of Systems  BP (!) 155/88   Pulse (!) 111   Temp (!) 102 F (38.9 C)   Ht 6' (1.829 m)   Wt 183 lb (83 kg)   SpO2 99%   BMI 24.82 kg/m     No Known Allergies  Past Medical History:  Diagnosis Date  . Pneumonia    gets recurrent PNA in left lung    No past surgical history on file.  Social History   Socioeconomic History  . Marital status: Single    Spouse name: Not on file  . Number of children: Not on file  . Years of education: Not on file  . Highest education level: Not on file  Social Needs  . Financial resource strain: Not on file  . Food insecurity - worry: Not on file  . Food insecurity - inability: Not on file  . Transportation needs - medical: Not on file  . Transportation needs - non-medical: Not on file  Occupational History  . Occupation: [employer]    Employer: [employer]  Tobacco Use  . Smoking status: Never Smoker  . Smokeless tobacco: Never Used  Substance and Sexual Activity  . Alcohol use: No  . Drug use: No  . Sexual activity: Not on file  Other Topics Concern  . Not on file  Social History Narrative  . Not on file    No family history on file.  Outpatient Encounter Medications as of 06/21/2017  Medication Sig  . bisoprolol-hydrochlorothiazide (ZIAC) 5-6.25 MG tablet Take 1 tablet by mouth daily.  . Efinaconazole (JUBLIA) 10 % SOLN Apply 1 application topically daily.  .  lansoprazole (PREVACID) 30 MG capsule TAKE 1 CAPSULE (30 MG TOTAL) BY MOUTH DAILY.  Marland Kitchen loratadine-pseudoephedrine ([facility] LORATADINE-D 24 HOUR) 10-240 MG 24 hr tablet Take 1 tablet by mouth daily.  . mupirocin ointment (BACTROBAN) 2 % Apply topically 3 (three) times daily.  . rosuvastatin (CRESTOR) 10 MG tablet Take 1 tablet (10 mg total) by mouth daily.  . traZODone (DESYREL) 50 MG tablet TAKE 0.5-2 TABLETS (25-100 MG TOTAL) BY MOUTH AT BEDTIME AS NEEDED FOR SLEEP.  Marland Kitchen azithromycin (ZITHROMAX) 250 MG tablet 2 Ttabs PO on Day 1, then one a day x 4 days.  . predniSONE (DELTASONE) 20 MG tablet Take 2 tablets (40 mg total) daily with breakfast by mouth.  . [EXPIRED] cefTRIAXone (ROCEPHIN) injection 1 g    No facility-administered encounter medications on file as of 06/21/2017.          Objective:   Physical Exam  Constitutional: He is oriented to person, place, and time. He appears well-developed and well-nourished.  HENT:  Head: Normocephalic and atraumatic.  Right Ear: External ear normal.  Left Ear: External ear normal.  Nose: Nose normal.  Mouth/Throat: Oropharynx is clear and moist.  TMs and canals are clear.  Eyes: Conjunctivae and EOM are normal. Pupils are equal, round, and reactive to light.  Neck: Neck supple. No thyromegaly present.  Cardiovascular: Normal rate, regular rhythm and normal heart sounds.  Pulmonary/Chest: Effort normal. He has no wheezes.  Diffuse coarse breath sounds, worse on the left compared to the right.  Lymphadenopathy:    He has no cervical adenopathy.  Neurological: He is alert and oriented to person, place, and time.  Skin: Skin is warm and dry.  Psychiatric: He has a normal mood and affect. His behavior is normal.      Assessment & Plan:  Likely pneumonia-did go ahead and swab him for the flu as well since his that time of year.  Negative for influenza.  Gave him 800 mg ibuprofen here in the office for fever.  We will start with azithromycin and give  Rocephin IM 1 g.  Prednisone for the next 5 days as well.  HTN - Elevated blood pressure-follow-up in 1 week.

## 2017-06-21 NOTE — Patient Instructions (Signed)
If not better in 24 hours then call and we will do a chest xrays.

## 2017-06-22 ENCOUNTER — Ambulatory Visit (INDEPENDENT_AMBULATORY_CARE_PROVIDER_SITE_OTHER): Payer: BLUE CROSS/BLUE SHIELD

## 2017-06-22 ENCOUNTER — Ambulatory Visit (INDEPENDENT_AMBULATORY_CARE_PROVIDER_SITE_OTHER): Payer: BLUE CROSS/BLUE SHIELD | Admitting: Physician Assistant

## 2017-06-22 VITALS — BP 121/80 | HR 87 | Temp 98.1°F

## 2017-06-22 DIAGNOSIS — J189 Pneumonia, unspecified organism: Secondary | ICD-10-CM

## 2017-06-22 DIAGNOSIS — R05 Cough: Secondary | ICD-10-CM | POA: Diagnosis not present

## 2017-06-22 LAB — SEDIMENTATION RATE: SED RATE: 6 mm/h (ref 0–20)

## 2017-06-22 LAB — CBC WITH DIFFERENTIAL/PLATELET
BASOS PCT: 0.2 %
Basophils Absolute: 32 cells/uL (ref 0–200)
EOS PCT: 0.1 %
Eosinophils Absolute: 16 cells/uL (ref 15–500)
HCT: 40.7 % (ref 38.5–50.0)
HEMOGLOBIN: 14.4 g/dL (ref 13.2–17.1)
LYMPHS ABS: 588 {cells}/uL — AB (ref 850–3900)
MCH: 31.4 pg (ref 27.0–33.0)
MCHC: 35.4 g/dL (ref 32.0–36.0)
MCV: 88.7 fL (ref 80.0–100.0)
MPV: 10.2 fL (ref 7.5–12.5)
Monocytes Relative: 6.6 %
NEUTROS ABS: 14215 {cells}/uL — AB (ref 1500–7800)
NEUTROS PCT: 89.4 %
Platelets: 262 10*3/uL (ref 140–400)
RBC: 4.59 10*6/uL (ref 4.20–5.80)
RDW: 13 % (ref 11.0–15.0)
Total Lymphocyte: 3.7 %
WBC: 15.9 10*3/uL — AB (ref 3.8–10.8)
WBCMIX: 1049 {cells}/uL — AB (ref 200–950)

## 2017-06-22 LAB — C-REACTIVE PROTEIN: CRP: 33.9 mg/L — AB (ref <8.0)

## 2017-06-22 LAB — ANCA SCREEN W REFLEX TITER: ANCA Screen: NEGATIVE

## 2017-06-22 MED ORDER — CEFTRIAXONE SODIUM 1 G IJ SOLR
1.0000 g | Freq: Once | INTRAMUSCULAR | Status: AC
  Administered 2017-06-22: 1 g via INTRAMUSCULAR

## 2017-06-22 NOTE — Progress Notes (Signed)
Per PCP, Pt came into clinic today for Rocephin injection. Pt tolerated injection in LUOQ well, no immediate complications. Would like to get repeat chest xray today per PCP recommendation, order placed.   Agree with above plan. Iran Planas PA-C

## 2017-06-23 ENCOUNTER — Telehealth: Payer: Self-pay | Admitting: Family Medicine

## 2017-06-23 NOTE — Telephone Encounter (Signed)
Received fax from [facility] and they denied coverage on Jublia they will only cover when proven by lab test and when the patient does not have a normal immune system or has fungus that is widespread. I am placing in providers box for review. - CF

## 2017-06-29 ENCOUNTER — Telehealth: Payer: Self-pay | Admitting: Family Medicine

## 2017-06-29 MED ORDER — CICLOPIROX 8 % EX SOLN: Freq: Every day | CUTANEOUS | 3 refills | Status: DC

## 2017-06-29 NOTE — Telephone Encounter (Signed)
Please call patient: [facility] will not pay for his Jublia.  Was sent over a different prescription for the nail fungus.  Hopefully this will go through since it does have a generic available.  Will only cover the Jublia if we do a toenail biopsy.

## 2017-07-04 NOTE — Telephone Encounter (Signed)
Left VM update for Pt. Callback provided for any questions.

## 2017-07-06 NOTE — Telephone Encounter (Signed)
Patient advised.

## 2017-07-30 ENCOUNTER — Other Ambulatory Visit: Payer: Self-pay | Admitting: Family Medicine

## 2017-09-16 ENCOUNTER — Ambulatory Visit: Payer: BLUE CROSS/BLUE SHIELD | Admitting: Family Medicine

## 2017-09-16 ENCOUNTER — Encounter: Payer: Self-pay | Admitting: Family Medicine

## 2017-09-16 VITALS — BP 143/86 | HR 75 | Ht 72.0 in | Wt 186.0 lb

## 2017-09-16 DIAGNOSIS — M542 Cervicalgia: Secondary | ICD-10-CM | POA: Diagnosis not present

## 2017-09-16 DIAGNOSIS — N183 Chronic kidney disease, stage 3 unspecified: Secondary | ICD-10-CM

## 2017-09-16 DIAGNOSIS — I10 Essential (primary) hypertension: Secondary | ICD-10-CM

## 2017-09-16 MED ORDER — BISOPROLOL-HYDROCHLOROTHIAZIDE 10-6.25 MG PO TABS: 1.0000 | ORAL_TABLET | Freq: Every day | ORAL | 0 refills | Status: DC

## 2017-09-16 NOTE — Progress Notes (Signed)
Subjective:    Patient ID: Victor Price, male    DOB: Dec 10, 1950, 66 y.o.   MRN: [id number]  HPI HTN - pt reports that his home readings have been running high. he checks it around the same time daily he has not been using any extra salt.  Blood pressures are typically running in the systolic 737 range and the diastolic 10G.  He is noticed that it has been creeping up after over a while but particularly in the last month or 2.  He denies any chest pain or shortness of breath or palpitations.  He does have some renal impairment that is known and last creatinine was 1.4 about 6 months ago.    He stated that he has been experiencing some L neck pain that radiates down into his outer shoulder for several wks but does not feel that this is related.  It seems to be coming and going.  He is noticing it more in the morning when he first gets up.  He is not sure if he may have just injured his neck or shoulder.  He does not have any chest pain associated with it.  It has woken him up at night before.  Is not currently taking any medications for it.  He said he worked out in the yard yesterday and did not notice any discomfort or chest pain at that time.   Review of Systems  BP (!) 143/86   Pulse 75   Ht 6' (1.829 m)   Wt 186 lb (84.4 kg)   SpO2 97%   BMI 25.23 kg/m     No Known Allergies  Past Medical History:  Diagnosis Date  . Pneumonia    gets recurrent PNA in left lung    No past surgical history on file.  Social History   Socioeconomic History  . Marital status: Single    Spouse name: Not on file  . Number of children: Not on file  . Years of education: Not on file  . Highest education level: Not on file  Social Needs  . Financial resource strain: Not on file  . Food insecurity - worry: Not on file  . Food insecurity - inability: Not on file  . Transportation needs - medical: Not on file  . Transportation needs - non-medical: Not on file  Occupational History  .  Occupation: [employer]    Employer: [employer]  Tobacco Use  . Smoking status: Never Smoker  . Smokeless tobacco: Never Used  Substance and Sexual Activity  . Alcohol use: No  . Drug use: No  . Sexual activity: Not on file  Other Topics Concern  . Not on file  Social History Narrative  . Not on file    No family history on file.  Outpatient Encounter Medications as of 09/16/2017  Medication Sig  . rosuvastatin (CRESTOR) 10 MG tablet Take 10 mg by mouth at bedtime.  . bisoprolol-hydrochlorothiazide (ZIAC) 10-6.25 MG tablet Take 1 tablet by mouth daily.  . ciclopirox (PENLAC) 8 % solution Apply topically at bedtime. Apply over nail and surrounding skin. Apply daily over previous coat. After seven (7) days, may remove with alcohol and continue cycle.  . lansoprazole (PREVACID) 30 MG capsule TAKE 1 CAPSULE (30 MG TOTAL) BY MOUTH DAILY.  Marland Kitchen loratadine-pseudoephedrine ([facility] LORATADINE-D 24 HOUR) 10-240 MG 24 hr tablet Take 1 tablet by mouth daily.  . mupirocin ointment (BACTROBAN) 2 % Apply topically 3 (three) times daily.  . traZODone (DESYREL)  50 MG tablet TAKE 0.5-2 TABLETS (25-100 MG TOTAL) BY MOUTH AT BEDTIME AS NEEDED FOR SLEEP.  . [DISCONTINUED] bisoprolol-hydrochlorothiazide (ZIAC) 5-6.25 MG tablet Take 1 tablet by mouth daily.  . [DISCONTINUED] predniSONE (DELTASONE) 20 MG tablet Take 2 tablets (40 mg total) daily with breakfast by mouth.  . [DISCONTINUED] rosuvastatin (CRESTOR) 10 MG tablet Take 1 tablet (10 mg total) by mouth daily.   No facility-administered encounter medications on file as of 09/16/2017.          Objective:   Physical Exam  Constitutional: He is oriented to person, place, and time. He appears well-developed and well-nourished.  HENT:  Head: Normocephalic and atraumatic.  Right Ear: External ear normal.  Left Ear: External ear normal.  Eyes: Conjunctivae are normal.  Neck: Neck supple. No thyromegaly present.  Cardiovascular: Normal rate, regular  rhythm and normal heart sounds.  No carotid bruits  Pulmonary/Chest: Effort normal.  Diffuse coarse breath sounds  Musculoskeletal:  Normal cervical flexion, extension, rotation right and left, side bending.  Shoulders with normal range of motion.  Nontender over the shoulder joint.  Lymphadenopathy:    He has no cervical adenopathy.  Neurological: He is alert and oriented to person, place, and time.  Skin: Skin is warm and dry.  Psychiatric: He has a normal mood and affect. His behavior is normal.          Assessment & Plan:  HTN -controlled blood pressures been running high at home over the last couple weeks as well.  We discussed options.  He is not taking any anti-inflammatories or decongestants.  He does not feel that he eats a diet high in salt but does not necessarily restrict either.  He has gained a little bit of weight over the winter.  None of his medications have changed and he is been taking them consistently.  We discussed that we do need to recheck his renal function to make sure that has not changed and that might be a cause of the elevation in his blood pressure.  Since he is 67 also consider the possibility of other causes such as renal artery stenosis.  We will increase his bisoprolol to 10 mg and keep the HCTZ at a low dose.  CKD 3-due to recheck renal function.  Will call with results once available.  Not on an ultrasound yet so we will plan to do that as well.  Left neck and arm pain will, absent of any chest pain-likely musculoskeletal.  Does work on gentle stretches and if not improving then please let me know.  If at any point it starts to radiate down into his chest and he needs to let us know immediately.

## 2017-09-21 ENCOUNTER — Ambulatory Visit (INDEPENDENT_AMBULATORY_CARE_PROVIDER_SITE_OTHER): Payer: BLUE CROSS/BLUE SHIELD

## 2017-09-21 DIAGNOSIS — N183 Chronic kidney disease, stage 3 unspecified: Secondary | ICD-10-CM

## 2017-09-21 DIAGNOSIS — I10 Essential (primary) hypertension: Secondary | ICD-10-CM

## 2017-09-21 LAB — BASIC METABOLIC PANEL WITH GFR
BUN/Creatinine Ratio: 11 (calc) (ref 6–22)
BUN: 19 mg/dL (ref 7–25)
CALCIUM: 10.2 mg/dL (ref 8.6–10.3)
CHLORIDE: 103 mmol/L (ref 98–110)
CO2: 29 mmol/L (ref 20–32)
Creat: 1.71 mg/dL — ABNORMAL HIGH (ref 0.70–1.25)
GFR, EST NON AFRICAN AMERICAN: 41 mL/min/{1.73_m2} — AB (ref >=60)
GFR, Est African American: 48 mL/min/{1.73_m2} — ABNORMAL LOW (ref >=60)
Glucose, Bld: 95 mg/dL (ref 65–99)
POTASSIUM: 4.2 mmol/L (ref 3.5–5.3)
SODIUM: 141 mmol/L (ref 135–146)

## 2017-09-21 LAB — TSH: TSH: 0.08 mIU/L — ABNORMAL LOW (ref 0.40–4.50)

## 2017-09-21 LAB — T4, FREE: FREE T4: 1.1 ng/dL (ref 0.8–1.8)

## 2017-09-21 LAB — THYROID PEROXIDASE ANTIBODY: Thyroperoxidase Ab SerPl-aCnc: <1 IU/mL (ref <9)

## 2017-09-21 LAB — T3, FREE: T3, Free: 3.1 pg/mL (ref 2.3–4.2)

## 2017-09-22 ENCOUNTER — Other Ambulatory Visit: Payer: Self-pay | Admitting: *Deleted

## 2017-09-22 DIAGNOSIS — E041 Nontoxic single thyroid nodule: Secondary | ICD-10-CM

## 2017-09-22 DIAGNOSIS — E291 Testicular hypofunction: Secondary | ICD-10-CM

## 2017-10-20 DIAGNOSIS — E291 Testicular hypofunction: Secondary | ICD-10-CM | POA: Diagnosis not present

## 2017-10-20 DIAGNOSIS — E041 Nontoxic single thyroid nodule: Secondary | ICD-10-CM | POA: Diagnosis not present

## 2017-10-21 LAB — T4, FREE: FREE T4: 1.1 ng/dL (ref 0.8–1.8)

## 2017-10-21 LAB — T3, FREE: T3 FREE: 3.4 pg/mL (ref 2.3–4.2)

## 2017-10-21 LAB — TESTOSTERONE TOTAL,FREE,BIO, MALES
Albumin: 4.3 g/dL (ref 3.6–5.1)
Sex Hormone Binding: 31 nmol/L (ref 22–77)
TESTOSTERONE BIOAVAILABLE: 79.2 ng/dL — AB (ref >=110.0)
TESTOSTERONE FREE: 40.2 pg/mL — AB (ref 46.0–224.0)
Testosterone: 298 ng/dL (ref 250–827)

## 2017-10-21 LAB — TSH: TSH: 0.11 m[IU]/L — AB (ref 0.40–4.50)

## 2017-10-31 ENCOUNTER — Ambulatory Visit: Payer: BLUE CROSS/BLUE SHIELD | Admitting: Family Medicine

## 2017-10-31 ENCOUNTER — Encounter: Payer: Self-pay | Admitting: Family Medicine

## 2017-10-31 VITALS — BP 136/86 | HR 63 | Ht 72.0 in | Wt 191.0 lb

## 2017-10-31 DIAGNOSIS — E291 Testicular hypofunction: Secondary | ICD-10-CM | POA: Insufficient documentation

## 2017-10-31 DIAGNOSIS — I10 Essential (primary) hypertension: Secondary | ICD-10-CM

## 2017-10-31 DIAGNOSIS — E0590 Thyrotoxicosis, unspecified without thyrotoxic crisis or storm: Secondary | ICD-10-CM

## 2017-10-31 DIAGNOSIS — R7989 Other specified abnormal findings of blood chemistry: Secondary | ICD-10-CM | POA: Diagnosis not present

## 2017-10-31 DIAGNOSIS — E039 Hypothyroidism, unspecified: Secondary | ICD-10-CM | POA: Insufficient documentation

## 2017-10-31 NOTE — Patient Instructions (Signed)
Plan to recheck thyroid and screen for Hep C.

## 2017-10-31 NOTE — Progress Notes (Signed)
Subjective:    Patient ID: Victor Price, male    DOB: [birth year], 67 y.o.   MRN: [id number]  HPI  67 year old male is here today to follow-up for uncontrolled hypertension.  I saw him about 6 weeks ago we decided to increase his bisoprolol and leave the HCTZ at a low dose.  We also did some lab work.  His TSH was mildly suppressed but free T4 and free T3 were normal.  He has had a previous pattern of this and in fact this last level actually looked a little bit better.  Also here to follow-up on low testosterone levels.  He had a low level in the past and more recently had a borderline low level.  He does report that every evening he will get very cold around 8-9 PM and then will go to bed around 9:30PM.  He has been like this for years.    Review of Systems     BP 136/86   Pulse 63   Ht 6' (1.829 m)   Wt 191 lb (86.6 kg)   SpO2 99%   BMI 25.90 kg/m     No Known Allergies  Past Medical History:  Diagnosis Date  . Pneumonia    gets recurrent PNA in left lung    No past surgical history on file.  Social History   Socioeconomic History  . Marital status: Single    Spouse name: Not on file  . Number of children: Not on file  . Years of education: Not on file  . Highest education level: Not on file  Occupational History  . Occupation: [place]  . Financial resource strain: Not on file  . Food insecurity:    Worry: Not on file    Inability: Not on file  . Transportation needs:    Medical: Not on file    Non-medical: Not on file  Tobacco Use  . Smoking status: Never Smoker  . Smokeless tobacco: Never Used  Substance and Sexual Activity  . Alcohol use: No  . Drug use: No  . Sexual activity: Not on file  Lifestyle  . Physical activity:    Days per week: Not on file    Minutes per session: Not on file  . Stress: Not on file  Relationships  . Social connections:    Talks on phone: Not on file    Gets together: Not  on file    Attends religious service: Not on file    Active member of club or organization: Not on file    Attends meetings of clubs or organizations: Not on file    Relationship status: Not on file  . Intimate partner violence:    Fear of current or ex partner: Not on file    Emotionally abused: Not on file    Physically abused: Not on file    Forced sexual activity: Not on file  Other Topics Concern  . Not on file  Social History Narrative  . Not on file    No family history on file.  Outpatient Encounter Medications as of 10/31/2017  Medication Sig  . bisoprolol-hydrochlorothiazide (ZIAC) 10-6.25 MG tablet Take 1 tablet by mouth daily.  . ciclopirox (PENLAC) 8 % solution Apply topically at bedtime. Apply over nail and surrounding skin. Apply daily over previous coat. After seven (7) days, may remove with alcohol and continue cycle.  . lansoprazole (PREVACID) 30 MG capsule TAKE 1 CAPSULE (  30 MG TOTAL) BY MOUTH DAILY.  Marland Kitchen loratadine-pseudoephedrine ([facility] LORATADINE-D 24 HOUR) 10-240 MG 24 hr tablet Take 1 tablet by mouth daily.  . mupirocin ointment (BACTROBAN) 2 % Apply topically 3 (three) times daily.  . rosuvastatin (CRESTOR) 10 MG tablet Take 10 mg by mouth at bedtime.  . traZODone (DESYREL) 50 MG tablet TAKE 0.5-2 TABLETS (25-100 MG TOTAL) BY MOUTH AT BEDTIME AS NEEDED FOR SLEEP.   No facility-administered encounter medications on file as of 10/31/2017.       Objective:   Physical Exam  Constitutional: He is oriented to person, place, and time. He appears well-developed and well-nourished.  HENT:  Head: Normocephalic and atraumatic.  Cardiovascular: Normal rate, regular rhythm and normal heart sounds.  Pulmonary/Chest: Effort normal and breath sounds normal.  Neurological: He is alert and oriented to person, place, and time.  Skin: Skin is warm and dry.  Psychiatric: He has a normal mood and affect. His behavior is normal.        Assessment & Plan:  Hypertension-  BP  improved today on new regimen.   Labs look great.    Hypogonadism-we discussed further evaluation.  We need to repeat testosterone level first thing in the morning.  We discussed possible treatment options and risks and benefits of this.  For now he is opting to hold off on any particular treatment.  I explained that we only use therapy and treatment if the patient is symptomatic and only continue it if it actually improves the symptoms.  Subclinical hyperthyroidism-due to recheck TSH, free T4 and free T3 in 6 months.warned about potential symptoms if he comes overtly hyperthyroid.  Screen Hep C.  He is ok with having that done in 6 months with next set of labs.

## 2017-11-23 ENCOUNTER — Other Ambulatory Visit: Payer: Self-pay | Admitting: Sports Medicine

## 2017-11-23 NOTE — Telephone Encounter (Signed)
To PCP

## 2017-12-05 ENCOUNTER — Encounter: Payer: Self-pay | Admitting: Family Medicine

## 2017-12-06 NOTE — Telephone Encounter (Signed)
HI Legrand Como,  Typically we will check for immunity first.  So we can do titers for measles.  If they are negative then definitely lets do a booster but if they are positive then you should be covered.  Please let me know if you are okay with that plan and I will send a lab order to the lab and you can go at your convenience between 7:30 and 4:30 Monday through Friday.

## 2017-12-15 ENCOUNTER — Other Ambulatory Visit: Payer: Self-pay | Admitting: Family Medicine

## 2018-01-23 ENCOUNTER — Other Ambulatory Visit: Payer: Self-pay | Admitting: Family Medicine

## 2018-03-20 ENCOUNTER — Other Ambulatory Visit: Payer: Self-pay | Admitting: Family Medicine

## 2018-03-23 ENCOUNTER — Telehealth: Payer: Self-pay | Admitting: Family Medicine

## 2018-03-23 NOTE — Telephone Encounter (Signed)
FYI : Pt called.  He believes he has pneumonia because he's had it so many times before. I spoke to Levada Dy and she advised that patient go to  Urgent Care. Pt said he did not want to go to UC instead he wanted me to call him if we had a cancellation today.

## 2018-03-24 ENCOUNTER — Ambulatory Visit: Payer: BLUE CROSS/BLUE SHIELD | Admitting: Sports Medicine

## 2018-03-24 ENCOUNTER — Ambulatory Visit (INDEPENDENT_AMBULATORY_CARE_PROVIDER_SITE_OTHER): Payer: BLUE CROSS/BLUE SHIELD

## 2018-03-24 ENCOUNTER — Encounter: Payer: Self-pay | Admitting: Sports Medicine

## 2018-03-24 DIAGNOSIS — R05 Cough: Secondary | ICD-10-CM | POA: Diagnosis not present

## 2018-03-24 DIAGNOSIS — J470 Bronchiectasis with acute lower respiratory infection: Secondary | ICD-10-CM | POA: Diagnosis not present

## 2018-03-24 MED ORDER — AZITHROMYCIN 250 MG PO TABS
ORAL_TABLET | ORAL | 0 refills | Status: DC
Start: 2018-03-24 — End: 2018-04-07

## 2018-03-24 MED ORDER — CIPROFLOXACIN HCL 750 MG PO TABS: 750.0000 mg | ORAL_TABLET | Freq: Two times a day (BID) | ORAL | 0 refills | Status: AC

## 2018-03-24 NOTE — Assessment & Plan Note (Signed)
Recurrent exacerbation, history of bronchiectasis. Azithromycin, ciprofloxacin, prednisone. Sputum cultures. Chest x-ray. Return as needed.

## 2018-03-24 NOTE — Progress Notes (Addendum)
  Subjective:    CC: Coughing  HPI: Victor Price is a pleasant 67 year old male with a long history of bronchiectasis.  He gets frequent episodes of pneumonia, cough, wheeze with sputum production.  He has seen multiple pulmonologist, had multiple x-rays.  More recently he was doing a lot of work outside, then developed malaise, cough but highly productive without blood, minimal shortness of breath, wheeze.  He prefers that we simply treat this without trying to reinvent the wheel.  I reviewed the past medical history, family history, social history, surgical history, and allergies today and no changes were needed.  Please see the problem list section below in epic for further details.  Past Medical History: Past Medical History:  Diagnosis Date  . Pneumonia    gets recurrent PNA in left lung   Past Surgical History: No past surgical history on file. Social History: Social History   Socioeconomic History  . Marital status: Single    Spouse name: Not on file  . Number of children: Not on file  . Years of education: Not on file  . Highest education level: Not on file  Occupational History  . Occupation: [employer]  . Financial resource strain: Not on file  . Food insecurity:    Worry: Not on file    Inability: Not on file  . Transportation needs:    Medical: Not on file    Non-medical: Not on file  Tobacco Use  . Smoking status: Never Smoker  . Smokeless tobacco: Never Used  Substance and Sexual Activity  . Alcohol use: No  . Drug use: No  . Sexual activity: Not on file  Lifestyle  . Physical activity:    Days per week: Not on file    Minutes per session: Not on file  . Stress: Not on file  Relationships  . Social connections:    Talks on phone: Not on file    Gets together: Not on file    Attends religious service: Not on file    Active member of club or organization: Not on file    Attends meetings of clubs or organizations:  Not on file    Relationship status: Not on file  Other Topics Concern  . Not on file  Social History Narrative  . Not on file   Family History: No family history on file. Allergies: No Known Allergies Medications: See med rec.  Review of Systems: No fevers, chills, night sweats, weight loss, chest pain, or shortness of breath.   Objective:    General: Well Developed, well nourished, and in no acute distress.  Neuro: Alert and oriented x3, extra-ocular muscles intact, sensation grossly intact.  HEENT: Normocephalic, atraumatic, pupils equal round reactive to light, neck supple, no masses, no lymphadenopathy, thyroid nonpalpable.  Skin: Warm and dry, no rashes. Cardiac: Regular rate and rhythm, no murmurs rubs or gallops, no lower extremity edema.  Respiratory: Coarse, wet sounds throughout with expiratory and inspiratory wheezes. Not using accessory muscles, speaking in full sentences.  Impression and Recommendations:    Bronchiectasis ([place]) Recurrent exacerbation, history of bronchiectasis. Azithromycin, ciprofloxacin, prednisone. Sputum cultures. Chest x-ray. Return as needed. ___________________________________________ Victor Price. Dianah Field, M.D., ABFM., CAQSM. Primary Care and [place] Instructor of [employer] [facility]

## 2018-03-24 NOTE — Telephone Encounter (Signed)
Patient scheduled with Dr Dianah Field.

## 2018-03-28 LAB — RESPIRATORY CULTURE OR RESPIRATORY AND SPUTUM CULTURE
MICRO NUMBER:: 90977262
SPECIMEN QUALITY:: ADEQUATE

## 2018-04-07 ENCOUNTER — Telehealth: Payer: Self-pay

## 2018-04-07 MED ORDER — PREDNISONE 10 MG (48) PO TBPK: ORAL_TABLET | Freq: Every day | ORAL | 0 refills | Status: DC

## 2018-04-07 MED ORDER — MOXIFLOXACIN HCL 400 MG PO TABS: 400.0000 mg | ORAL_TABLET | Freq: Every day | ORAL | 0 refills | Status: AC

## 2018-04-07 NOTE — Telephone Encounter (Signed)
Pt called stating "I have pneumonia again" and wants Dr T to call in the same medication he gave him last time for pneumonia plus some prednisone.   Pt states he has been calling Dr Mcneil Sober assistants phone today but has not heard back and is now desperate.   I advised pt that I would send Dr T a high priority message and encouraged pt to check with the pharmacy ([facility] after 5) to see if anything is sent in. I advised pt that if he has NOT received anything from the pharmacy, he needs to go to an urgent care and be evaluated.   Note to Dr T ...  Can you send in medication for pt?

## 2018-04-07 NOTE — Telephone Encounter (Signed)
Noted, sending and moxifloxacin and a prednisone 12-day taper.

## 2018-04-20 ENCOUNTER — Other Ambulatory Visit: Payer: Self-pay | Admitting: *Deleted

## 2018-04-20 MED ORDER — ROSUVASTATIN CALCIUM 10 MG PO TABS: 10.0000 mg | ORAL_TABLET | Freq: Every day | ORAL | 0 refills | Status: DC

## 2018-05-11 ENCOUNTER — Other Ambulatory Visit: Payer: Self-pay | Admitting: Family Medicine

## 2018-06-04 ENCOUNTER — Other Ambulatory Visit: Payer: Self-pay | Admitting: Family Medicine

## 2018-06-09 ENCOUNTER — Other Ambulatory Visit: Payer: Self-pay | Admitting: Family Medicine

## 2018-06-12 ENCOUNTER — Other Ambulatory Visit: Payer: Self-pay | Admitting: *Deleted

## 2018-06-22 ENCOUNTER — Ambulatory Visit (INDEPENDENT_AMBULATORY_CARE_PROVIDER_SITE_OTHER): Payer: 59

## 2018-06-22 ENCOUNTER — Ambulatory Visit (INDEPENDENT_AMBULATORY_CARE_PROVIDER_SITE_OTHER): Payer: 59 | Admitting: Family Medicine

## 2018-06-22 ENCOUNTER — Encounter: Payer: Self-pay | Admitting: Family Medicine

## 2018-06-22 VITALS — BP 138/84 | HR 86 | Ht 72.0 in | Wt 173.0 lb

## 2018-06-22 DIAGNOSIS — R195 Other fecal abnormalities: Secondary | ICD-10-CM

## 2018-06-22 DIAGNOSIS — N401 Enlarged prostate with lower urinary tract symptoms: Secondary | ICD-10-CM

## 2018-06-22 DIAGNOSIS — M48061 Spinal stenosis, lumbar region without neurogenic claudication: Secondary | ICD-10-CM | POA: Diagnosis not present

## 2018-06-22 DIAGNOSIS — N183 Chronic kidney disease, stage 3 unspecified: Secondary | ICD-10-CM

## 2018-06-22 DIAGNOSIS — E041 Nontoxic single thyroid nodule: Secondary | ICD-10-CM

## 2018-06-22 DIAGNOSIS — N400 Enlarged prostate without lower urinary tract symptoms: Secondary | ICD-10-CM

## 2018-06-22 DIAGNOSIS — M5440 Lumbago with sciatica, unspecified side: Secondary | ICD-10-CM

## 2018-06-22 DIAGNOSIS — Z125 Encounter for screening for malignant neoplasm of prostate: Secondary | ICD-10-CM

## 2018-06-22 DIAGNOSIS — I10 Essential (primary) hypertension: Secondary | ICD-10-CM | POA: Diagnosis not present

## 2018-06-22 DIAGNOSIS — R351 Nocturia: Secondary | ICD-10-CM | POA: Diagnosis not present

## 2018-06-22 DIAGNOSIS — R350 Frequency of micturition: Secondary | ICD-10-CM

## 2018-06-22 DIAGNOSIS — R809 Proteinuria, unspecified: Secondary | ICD-10-CM

## 2018-06-22 DIAGNOSIS — E785 Hyperlipidemia, unspecified: Secondary | ICD-10-CM

## 2018-06-22 LAB — POCT URINALYSIS DIPSTICK
Bilirubin, UA: NEGATIVE
Glucose, UA: NEGATIVE
Ketones, UA: NEGATIVE
LEUKOCYTES UA: NEGATIVE
Nitrite, UA: NEGATIVE
Protein, UA: NEGATIVE
RBC UA: NEGATIVE
SPEC GRAV UA: 1.02 (ref 1.010–1.025)
UROBILINOGEN UA: 0.2 U/dL
pH, UA: 5.5 (ref 5.0–8.0)

## 2018-06-22 LAB — POCT UA - MICROALBUMIN
Creatinine, POC: 200 mg/dL
MICROALBUMIN (UR) POC: 150 mg/L

## 2018-06-22 LAB — POC HEMOCCULT BLD/STL (OFFICE/1-CARD/DIAGNOSTIC): Fecal Occult Blood, POC: POSITIVE — AB

## 2018-06-22 MED ORDER — LANSOPRAZOLE 30 MG PO CPDR: 30.0000 mg | DELAYED_RELEASE_CAPSULE | Freq: Every day | ORAL | 3 refills | Status: DC

## 2018-06-22 MED ORDER — MUPIROCIN 2 % EX OINT: TOPICAL_OINTMENT | Freq: Three times a day (TID) | CUTANEOUS | 99 refills | Status: DC

## 2018-06-22 MED ORDER — PREDNISONE 20 MG PO TABS: 40.0000 mg | ORAL_TABLET | Freq: Every day | ORAL | 0 refills | Status: DC

## 2018-06-22 MED ORDER — ROSUVASTATIN CALCIUM 10 MG PO TABS: 10.0000 mg | ORAL_TABLET | Freq: Every day | ORAL | 3 refills | Status: DC
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
## 2018-06-27 LAB — CBC WITH DIFFERENTIAL/PLATELET
BASOS ABS: 42 {cells}/uL (ref 0–200)
Basophils Relative: 0.4 %
EOS ABS: 114 {cells}/uL (ref 15–500)
Eosinophils Relative: 1.1 %
HCT: 44.6 % (ref 38.5–50.0)
Hemoglobin: 15.2 g/dL (ref 13.2–17.1)
Lymphs Abs: 3754 cells/uL (ref 850–3900)
MCH: 31 pg (ref 27.0–33.0)
MCHC: 34.1 g/dL (ref 32.0–36.0)
MCV: 91 fL (ref 80.0–100.0)
MPV: 9.7 fL (ref 7.5–12.5)
Monocytes Relative: 11.4 %
NEUTROS PCT: 51 %
Neutro Abs: 5304 cells/uL (ref 1500–7800)
PLATELETS: 348 10*3/uL (ref 140–400)
RBC: 4.9 10*6/uL (ref 4.20–5.80)
RDW: 13.8 % (ref 11.0–15.0)
TOTAL LYMPHOCYTE: 36.1 %
WBC: 10.4 10*3/uL (ref 3.8–10.8)
WBCMIX: 1186 {cells}/uL — AB (ref 200–950)

## 2018-06-28 LAB — COMPLETE METABOLIC PANEL WITH GFR
AG RATIO: 1.5 (calc) (ref 1.0–2.5)
ALKALINE PHOSPHATASE (APISO): 59 U/L (ref 40–115)
ALT: 27 U/L (ref 9–46)
AST: 12 U/L (ref 10–35)
Albumin: 4 g/dL (ref 3.6–5.1)
BUN/Creatinine Ratio: 14 (calc) (ref 6–22)
BUN: 23 mg/dL (ref 7–25)
CO2: 27 mmol/L (ref 20–32)
CREATININE: 1.63 mg/dL — AB (ref 0.70–1.25)
Calcium: 9.6 mg/dL (ref 8.6–10.3)
Chloride: 102 mmol/L (ref 98–110)
GFR, Est African American: 50 mL/min/{1.73_m2} — ABNORMAL LOW (ref >=60)
GFR, Est Non African American: 44 mL/min/{1.73_m2} — ABNORMAL LOW (ref >=60)
GLOBULIN: 2.7 g/dL (ref 1.9–3.7)
Glucose, Bld: 91 mg/dL (ref 65–99)
Potassium: 3.9 mmol/L (ref 3.5–5.3)
SODIUM: 139 mmol/L (ref 135–146)
Total Bilirubin: 0.9 mg/dL (ref 0.2–1.2)
Total Protein: 6.7 g/dL (ref 6.1–8.1)

## 2018-06-28 LAB — LIPID PANEL
CHOL/HDL RATIO: 3.1 (calc) (ref <5.0)
CHOLESTEROL: 222 mg/dL — AB (ref <200)
HDL: 72 mg/dL (ref >40)
LDL Cholesterol (Calc): 120 mg/dL (calc) — ABNORMAL HIGH
NON-HDL CHOLESTEROL (CALC): 150 mg/dL — AB (ref <130)
Triglycerides: 189 mg/dL — ABNORMAL HIGH (ref <150)

## 2018-06-28 LAB — PSA: PSA: 1.8 ng/mL (ref <=4.0)

## 2018-06-28 LAB — T4, FREE: Free T4: 1.2 ng/dL (ref 0.8–1.8)

## 2018-06-28 LAB — T3, FREE: T3 FREE: 3.4 pg/mL (ref 2.3–4.2)

## 2018-06-28 LAB — TSH+FREE T4: TSH W/REFLEX TO FT4: 0.31 mIU/L — ABNORMAL LOW (ref 0.40–4.50)

## 2018-06-29 ENCOUNTER — Telehealth: Payer: Self-pay

## 2018-06-29 DIAGNOSIS — R351 Nocturia: Secondary | ICD-10-CM

## 2018-06-29 NOTE — Telephone Encounter (Signed)
Victor Price called stating he never received the prostate medication. Please advise.

## 2018-06-30 MED ORDER — TAMSULOSIN HCL 0.4 MG PO CAPS: 0.4000 mg | ORAL_CAPSULE | Freq: Every day | ORAL | 3 refills | Status: DC

## 2018-06-30 NOTE — Telephone Encounter (Signed)
Pt advised.

## 2018-06-30 NOTE — Telephone Encounter (Signed)
I apologize. New Rx sent.

## 2018-07-03 ENCOUNTER — Ambulatory Visit: Payer: Self-pay | Admitting: Family Medicine

## 2018-07-04 NOTE — Progress Notes (Signed)
[place] Name 06/22/18 1400         During the last Month   Sensation of Bladder not Empty  Less than 1 time in 5     Urinate<2 hours after last  Almost Always     Mult. stop/start when voiding  Almost Always     Difficult to postpone voiding  Almost Always     Weak urinary stream  More than half the time     Push/strain to begin urination  Less than 1 time in 5     Times per night up to urinate  More than half the time quality of life=3       OTHER   Total Score  25

## 2018-07-26 ENCOUNTER — Other Ambulatory Visit: Payer: Self-pay | Admitting: Family Medicine

## 2018-08-31 ENCOUNTER — Other Ambulatory Visit: Payer: Self-pay | Admitting: Family Medicine

## 2018-09-09 ENCOUNTER — Other Ambulatory Visit: Payer: Self-pay | Admitting: Family Medicine

## 2018-09-09 DIAGNOSIS — E785 Hyperlipidemia, unspecified: Secondary | ICD-10-CM

## 2018-09-11 NOTE — Telephone Encounter (Signed)
Routing to pcp for compatible alternative .Victor Price, [name]

## 2018-09-12 MED ORDER — ATORVASTATIN CALCIUM 40 MG PO TABS: 40.0000 mg | ORAL_TABLET | Freq: Every day | ORAL | 3 refills | Status: DC

## 2018-09-12 NOTE — Telephone Encounter (Signed)
Crestor caused too high patient requesting something less expensive.  New one sent for atorvastatin.

## 2018-09-19 ENCOUNTER — Telehealth: Payer: Self-pay

## 2018-09-19 MED ORDER — PREDNISONE 20 MG PO TABS: 40.0000 mg | ORAL_TABLET | Freq: Every day | ORAL | 0 refills | Status: DC

## 2018-09-19 NOTE — Telephone Encounter (Signed)
Pt advised.

## 2018-09-19 NOTE — Telephone Encounter (Signed)
Auden states he is having a flare up of lower back pain radiating down left leg. The orthopedic surgeon is not willing to do surgery. He would like another round of prednisone. Please advise.

## 2018-09-19 NOTE — Telephone Encounter (Signed)
rx sent for prednisone burst.

## 2018-09-25 ENCOUNTER — Other Ambulatory Visit: Payer: Self-pay | Admitting: Family Medicine

## 2018-09-25 DIAGNOSIS — R351 Nocturia: Secondary | ICD-10-CM

## 2018-11-09 ENCOUNTER — Other Ambulatory Visit: Payer: Self-pay

## 2018-11-09 ENCOUNTER — Telehealth: Payer: Self-pay

## 2018-11-09 ENCOUNTER — Encounter: Payer: Self-pay | Admitting: Family Medicine

## 2018-11-09 ENCOUNTER — Telehealth (INDEPENDENT_AMBULATORY_CARE_PROVIDER_SITE_OTHER): Payer: 59 | Admitting: Family Medicine

## 2018-11-09 VITALS — Ht 72.0 in

## 2018-11-09 DIAGNOSIS — J470 Bronchiectasis with acute lower respiratory infection: Secondary | ICD-10-CM

## 2018-11-09 DIAGNOSIS — R05 Cough: Secondary | ICD-10-CM

## 2018-11-09 DIAGNOSIS — R059 Cough, unspecified: Secondary | ICD-10-CM

## 2018-11-09 MED ORDER — ALBUTEROL SULFATE HFA 108 (90 BASE) MCG/ACT IN AERS: 2.0000 | INHALATION_SPRAY | Freq: Four times a day (QID) | RESPIRATORY_TRACT | 0 refills | Status: DC | PRN

## 2018-11-09 NOTE — Progress Notes (Signed)
Virtual Visit via Video Note  I connected with Victor Price on 11/09/18 at  9:50 AM EDT by a video enabled telemedicine application and verified that I am speaking with the correct person using two identifiers.   I discussed the limitations of evaluation and management by telemedicine and the availability of in person appointments. The patient expressed understanding and agreed to proceed.       Subjective:    CC: Cough x 3 wk  HPI:  Pt reports that he has had an aggravating cough for the past 3 wks. It hasn't gotten worse but he has only taken tylenol and mucinex. He is concerned because  he is in and out and states that normally pollen doesn't bother him but if he needs to he will take Claritin-D for relief.  He is hearing gargling in his lungs.  It has been on and off.  No wheezing.  No SOB or labored breathing.  No discolored sputum.  NO fever, chills or sweats.  He is still working.  Never smoker. Does tend to get bronchitis frequently.  Cough is usually better in the morning and gets worse as the day goes on.   Past medical history, Surgical history, Family history not pertinant except as noted below, Social history, Allergies, and medications have been entered into the medical record, reviewed, and corrections made.   Review of Systems: No fevers, chills, night sweats, weight loss, chest pain, or shortness of breath.   Objective:    General: Speaking clearly in complete sentences without any shortness of breath.  Alert and oriented x3.  Normal judgment. No apparent acute distress. Well groomed.      Impression and Recommendations:    Chronic cough - hasn't been getting worse. Will send over albuterol. Continue mucinex. Recommend taking Claritin daily for the next 2 weeks.  Call if develops a fever or new symptoms such as SOB.  May have some component of alleriges.    Bronchiectasis - see note above.    I discussed the assessment and treatment plan with the patient. The  patient was provided an opportunity to ask questions and all were answered. The patient agreed with the plan and demonstrated an understanding of the instructions.   The patient was advised to call back or seek an in-person evaluation if the symptoms worsen or if the condition fails to improve as anticipated.    Beatrice Lecher, MD

## 2018-11-09 NOTE — Telephone Encounter (Signed)
Letter created in my chart.  He has access to my chart so he should be able to just printed off from home if he needs a signed copy then we could certainly fax it to him

## 2018-11-09 NOTE — Telephone Encounter (Signed)
Pt advised, will print off of MyChart

## 2018-11-09 NOTE — Telephone Encounter (Signed)
Victor Price called and would like a work note to be out of work for 4-6 weeks. He is worried about getting the virus.

## 2018-11-09 NOTE — Progress Notes (Signed)
Pt reports that he has had an aggravating cough for the past 3 wks. It hasn't gotten worse but he has only taken tylenol and mucinex. He is concerned because  he is in and out and states that normally pollen doesn't bother him but if he needs to he will take Claritin-D for relief.   If he does have a cough it hasn't been a productive cough. He is mostly concerned because of his age and health conditions.Elouise Munroe, [place]

## 2018-11-17 ENCOUNTER — Other Ambulatory Visit: Payer: Self-pay | Admitting: Family Medicine

## 2018-11-30 ENCOUNTER — Other Ambulatory Visit: Payer: Self-pay | Admitting: Family Medicine

## 2018-12-19 ENCOUNTER — Ambulatory Visit (INDEPENDENT_AMBULATORY_CARE_PROVIDER_SITE_OTHER): Payer: 59 | Admitting: Family Medicine

## 2018-12-19 ENCOUNTER — Encounter: Payer: Self-pay | Admitting: Family Medicine

## 2018-12-19 VITALS — BP 135/77 | Ht 72.0 in | Wt 173.0 lb

## 2018-12-19 DIAGNOSIS — J470 Bronchiectasis with acute lower respiratory infection: Secondary | ICD-10-CM | POA: Diagnosis not present

## 2018-12-19 DIAGNOSIS — N183 Chronic kidney disease, stage 3 unspecified: Secondary | ICD-10-CM

## 2018-12-19 DIAGNOSIS — I10 Essential (primary) hypertension: Secondary | ICD-10-CM

## 2018-12-19 DIAGNOSIS — E0590 Thyrotoxicosis, unspecified without thyrotoxic crisis or storm: Secondary | ICD-10-CM

## 2018-12-19 MED ORDER — ALBUTEROL SULFATE HFA 108 (90 BASE) MCG/ACT IN AERS: 2.0000 | INHALATION_SPRAY | Freq: Four times a day (QID) | RESPIRATORY_TRACT | 99 refills | Status: DC | PRN

## 2018-12-19 NOTE — Progress Notes (Signed)
Virtual Visit via Video Note  I connected with Victor Price on 12/19/18 at  1:20 PM EDT by a video enabled telemedicine application and verified that I am speaking with the correct person using two identifiers.   I discussed the limitations of evaluation and management by telemedicine and the availability of in person appointments. The patient expressed understanding and agreed to proceed.  Pt was at home and I was in my office for the virtual visit.     Subjective:    CC: Persistant cough.  HPI:  68 year old following up for cough and bronchiectasis.  When I last saw him on April 2 he darty had a cough for about 3 weeks at that point.  We sent a prescription for albuterol and he was also using Mucinex and Claritin.  I did encourage him to call if he felt like at any point he was getting worse. Feels like might have run a low grade temp last night. Didn't measure it.    Hypertension- Pt denies chest pain, SOB, dizziness, or heart palpitations.  Taking meds as directed w/o problems.  Denies medication side effects.    CKD 3 - no recent kidney changes or symptoms.   He has been staying home.     Past medical history, Surgical history, Family history not pertinant except as noted below, Social history, Allergies, and medications have been entered into the medical record, reviewed, and corrections made.   Review of Systems: No fevers, chills, night sweats, weight loss, chest pain, or shortness of breath.   Objective:    General: Speaking clearly in complete sentences without any shortness of breath.  Alert and oriented x3.  Normal judgment. No apparent acute distress. Well groomed.     Impression and Recommendations:    HTN - Well controlled. Continue current regimen. Follow up in  6 months.   Bronchiectasis - doing OK. Still has his baseline cough. Says occ will have a fever but encouraged to keep his eye on it. Additional work note provided to be out until June 1st to [facility]  for fever.   CKD 3-due to recheck renal function.  Following every 6 months.  Subclinical hyperthyroidism-plan to recheck TSH.  Last level was 0.36 months ago.      I discussed the assessment and treatment plan with the patient. The patient was provided an opportunity to ask questions and all were answered. The patient agreed with the plan and demonstrated an understanding of the instructions.   The patient was advised to call back or seek an in-person evaluation if the symptoms worsen or if the condition fails to improve as anticipated.   Beatrice Lecher, MD

## 2018-12-19 NOTE — Progress Notes (Signed)
Pt has no problems at this time. Just concerned about returning to work.   Pt will recheck his bp and give this reading to Dr. Madilyn Fireman.Maryruth Eve, Lahoma Crocker, CMA

## 2018-12-20 ENCOUNTER — Ambulatory Visit: Payer: 59 | Admitting: Family Medicine

## 2019-02-02 ENCOUNTER — Telehealth: Payer: Self-pay

## 2019-02-02 DIAGNOSIS — R7989 Other specified abnormal findings of blood chemistry: Secondary | ICD-10-CM

## 2019-02-02 NOTE — Telephone Encounter (Signed)
Patient advised.

## 2019-02-02 NOTE — Telephone Encounter (Signed)
Patient requested to have his testosterone checked. Per last testosterone results he needed a recheck. Lab ordered.

## 2019-02-02 NOTE — Telephone Encounter (Signed)
Perfect. Can go in AM anytime.

## 2019-02-07 LAB — TSH+FREE T4: TSH W/REFLEX TO FT4: 0.31 mIU/L — ABNORMAL LOW (ref 0.40–4.50)

## 2019-02-07 LAB — BASIC METABOLIC PANEL WITH GFR
BUN/Creatinine Ratio: 13 (calc) (ref 6–22)
BUN: 19 mg/dL (ref 7–25)
CO2: 25 mmol/L (ref 20–32)
Calcium: 9.9 mg/dL (ref 8.6–10.3)
Chloride: 107 mmol/L (ref 98–110)
Creat: 1.44 mg/dL — ABNORMAL HIGH (ref 0.70–1.25)
GFR, Est African American: 58 mL/min/{1.73_m2} — ABNORMAL LOW (ref >=60)
GFR, Est Non African American: 50 mL/min/{1.73_m2} — ABNORMAL LOW (ref >=60)
Glucose, Bld: 100 mg/dL — ABNORMAL HIGH (ref 65–99)
Potassium: 4.5 mmol/L (ref 3.5–5.3)
Sodium: 141 mmol/L (ref 135–146)

## 2019-02-07 LAB — TESTOSTERONE TOTAL,FREE,BIO, MALES
Albumin: 4.1 g/dL (ref 3.6–5.1)
Sex Hormone Binding: 26 nmol/L (ref 22–77)
Testosterone, Bioavailable: 96.2 ng/dL — ABNORMAL LOW (ref >=110.0)
Testosterone, Free: 51.1 pg/mL (ref 46.0–224.0)
Testosterone: 322 ng/dL (ref 250–827)

## 2019-02-07 LAB — T4, FREE: Free T4: 1.1 ng/dL (ref 0.8–1.8)

## 2019-02-08 ENCOUNTER — Ambulatory Visit (INDEPENDENT_AMBULATORY_CARE_PROVIDER_SITE_OTHER): Payer: 59 | Admitting: Family Medicine

## 2019-02-08 ENCOUNTER — Encounter: Payer: Self-pay | Admitting: Family Medicine

## 2019-02-08 VITALS — BP 120/80 | HR 79 | Ht 72.0 in | Wt 170.0 lb

## 2019-02-08 DIAGNOSIS — G4700 Insomnia, unspecified: Secondary | ICD-10-CM | POA: Diagnosis not present

## 2019-02-08 DIAGNOSIS — E291 Testicular hypofunction: Secondary | ICD-10-CM

## 2019-02-08 MED ORDER — TESTOSTERONE 10 MG/ACT (2%) TD GEL: TRANSDERMAL | 2 refills | Status: DC

## 2019-02-08 NOTE — Progress Notes (Signed)
Virtual Visit via Video Note  I connected with Victor Price on 02/08/19 at  8:50 AM EDT by a video enabled telemedicine application and verified that I am speaking with the correct person using two identifiers.   I discussed the limitations of evaluation and management by telemedicine and the availability of in person appointments. The patient expressed understanding and agreed to proceed.  Pt was at home and I was in my office for the virtual visit.     Subjective:    CC: low testosterone results.    HPI: The 68 year old male is following up for low testosterone levels.  He initially had his testosterone levels checked in March 2019.  Total testosterone was 298 on the low end of normal but free testosterone was low at 40.  Checked his levels again this summer on June 30.  Testosterone was 322 again on the lower end of normal.  Free testosterone was technically in the normal at 51 but definitely borderline and the bioavailable testosterone was low at 96.  He would like to discuss potential testosterone replacement therapy including risks and benefits and potential formulations.  He is still not sleeping well, but not every night. He is not overweight.  No mood concerns or change in libido.  He denies any depression symptoms.  He has mostly been working at home.  Lab Results  Component Value Date   TESTOSTERONE 322 02/06/2019     Past medical history, Surgical history, Family history not pertinant except as noted below, Social history, Allergies, and medications have been entered into the medical record, reviewed, and corrections made.   Review of Systems: No fevers, chills, night sweats, weight loss, chest pain, or shortness of breath.   Objective:    General: Speaking clearly in complete sentences without any shortness of breath.  Alert and oriented x3.  Normal judgment. No apparent acute distress. Well groomed.     Impression and Recommendations:   Hypogonadism, primary-  New  Dx. we discussed potential symptoms and signs of hypogonadism.  As his total testosterone levels are normal but the bioavailable is borderline low.  He is not having any major symptoms except for sleep issues and some fatigue.  We discussed that we could certainly consider a trial form on replacement therapy but that there does need to be significant benefit for [name] to continue it.  Discussed potential side effects of replacement therapy including prostate cancer risk, potential for increased cardiovascular risk, acne, breast enlargement etc.  We did discuss the different formulations for testosterone replacement.  He is most interested in [place] so prescription sent to the pharmacy.  Plan to get some additional labs before he starts the testosterone replacement including FSH, LH, PSA and a CBC..   Monitor PSA.  Will need repeat labs in about 2 to 3 months on the regimen so that we can adjust his dose.  At the 52-month mark we will make a decision about whether or not he wants to continue with the medication whether or not it is effective for helping reduce his current symptoms.   I discussed the assessment and treatment plan with the patient. The patient was provided an opportunity to ask questions and all were answered. The patient agreed with the plan and demonstrated an understanding of the instructions.   The patient was advised to call back or seek an in-person evaluation if the symptoms worsen or if the condition fails to improve as anticipated.   Beatrice Lecher, MD

## 2019-02-16 LAB — CBC
HCT: 39.3 % (ref 38.5–50.0)
Hemoglobin: 13.1 g/dL — ABNORMAL LOW (ref 13.2–17.1)
MCH: 31.2 pg (ref 27.0–33.0)
MCHC: 33.3 g/dL (ref 32.0–36.0)
MCV: 93.6 fL (ref 80.0–100.0)
MPV: 9.6 fL (ref 7.5–12.5)
Platelets: 316 10*3/uL (ref 140–400)
RBC: 4.2 10*6/uL (ref 4.20–5.80)
RDW: 12.9 % (ref 11.0–15.0)
WBC: 7.5 10*3/uL (ref 3.8–10.8)

## 2019-02-16 LAB — PROLACTIN: Prolactin: 8.1 ng/mL (ref 2.0–18.0)

## 2019-02-16 LAB — LUTEINIZING HORMONE: LH: 6.2 m[IU]/mL (ref 1.6–15.2)

## 2019-02-16 LAB — FOLLICLE STIMULATING HORMONE: FSH: 4.1 m[IU]/mL (ref 1.6–8.0)

## 2019-02-16 LAB — PSA: PSA: 1.6 ng/mL (ref <=4.0)

## 2019-02-25 ENCOUNTER — Other Ambulatory Visit: Payer: Self-pay | Admitting: Family Medicine

## 2019-03-01 ENCOUNTER — Ambulatory Visit (INDEPENDENT_AMBULATORY_CARE_PROVIDER_SITE_OTHER): Payer: 59 | Admitting: Family Medicine

## 2019-03-01 ENCOUNTER — Encounter: Payer: Self-pay | Admitting: Family Medicine

## 2019-03-01 VITALS — BP 130/86 | HR 82 | Ht 72.0 in | Wt 170.0 lb

## 2019-03-01 DIAGNOSIS — R059 Cough, unspecified: Secondary | ICD-10-CM

## 2019-03-01 DIAGNOSIS — R05 Cough: Secondary | ICD-10-CM | POA: Diagnosis not present

## 2019-03-01 DIAGNOSIS — J470 Bronchiectasis with acute lower respiratory infection: Secondary | ICD-10-CM | POA: Diagnosis not present

## 2019-03-01 MED ORDER — PREDNISONE 20 MG PO TABS: 40.0000 mg | ORAL_TABLET | Freq: Every day | ORAL | 0 refills | Status: DC

## 2019-03-01 MED ORDER — HYDROCODONE-HOMATROPINE 5-1.5 MG/5ML PO SYRP: 5.0000 mL | ORAL_SOLUTION | Freq: Every evening | ORAL | 0 refills | Status: DC | PRN

## 2019-03-01 MED ORDER — DOXYCYCLINE HYCLATE 100 MG PO TABS: 100.0000 mg | ORAL_TABLET | Freq: Two times a day (BID) | ORAL | 0 refills | Status: DC

## 2019-03-01 NOTE — Progress Notes (Signed)
Virtual Visit via Video Note  I connected with Victor Price on 03/01/19 at 10:50 AM EDT by a video enabled telemedicine application and verified that I am speaking with the correct person using two identifiers.   I discussed the limitations of evaluation and management by telemedicine and the availability of in person appointments. The patient expressed understanding and agreed to proceed.  Pt was at home and I was in my office for the virtual visit.     Subjective:    CC: thinks might have pneumonia    HPI: He says starting 2 nights ago he had a significant increase and cough.  He says his wife had a summer cold about 2 weeks ago and then he is pretty sure that he got it.  He had a little bit of nasal congestion and postnasal drip but no fevers or chills.  He has been using nasal saline rinse daily and felt like that was actually helping but then 2 nights ago developed a severe cough to the point where he was feeling extremely short of breath.  He did not feel short of breath in between coughing.  He started to cough up some gray-yellow sputum and felt like maybe he has been running a low-grade temperature the last 2 nights.  He has had a lot of noise and wheezing in his chest.  He feels like this is his typical symptoms that he gets with his [place] bronchiectasis when he has a flare and feels like it starting to turn into pneumonia does not feel any different.  No sore throat.  No diarrhea or vomiting.  He would like something for the cough at bedtime.  He does not feel short of breath today.   Past medical history, Surgical history, Family history not pertinant except as noted below, Social history, Allergies, and medications have been entered into the medical record, reviewed, and corrections made.   Review of Systems: No fevers, chills, night sweats, weight loss, chest pain, or shortness of breath.   Objective:    General: Speaking clearly in complete sentences without any shortness of  breath.  Alert and oriented x3.  Normal judgment. No apparent acute distress.    Impression and Recommendations:   Bronchiectasis/lower respiratory infection-discussed potential diagnosis.  I will send over something for cough as well as some prednisone which usually works really well for him.  Because we are getting ready to head into the weekend and also get a send over an antibiotic to fill if he feels like he is getting worse or not improving.       I discussed the assessment and treatment plan with the patient. The patient was provided an opportunity to ask questions and all were answered. The patient agreed with the plan and demonstrated an understanding of the instructions.   The patient was advised to call back or seek an in-person evaluation if the symptoms worsen or if the condition fails to improve as anticipated.   Beatrice Lecher, MD

## 2019-03-01 NOTE — Progress Notes (Signed)
Temperature off/on more at night. He said that this is normal for him due to his lung condition.   He said that he can "taste" it. Maryruth Eve, Lahoma Crocker, CMA

## 2019-03-06 ENCOUNTER — Other Ambulatory Visit: Payer: Self-pay | Admitting: Family Medicine

## 2019-03-06 DIAGNOSIS — R05 Cough: Secondary | ICD-10-CM

## 2019-03-06 DIAGNOSIS — R059 Cough, unspecified: Secondary | ICD-10-CM

## 2019-03-06 DIAGNOSIS — J470 Bronchiectasis with acute lower respiratory infection: Secondary | ICD-10-CM

## 2019-03-25 ENCOUNTER — Other Ambulatory Visit: Payer: Self-pay | Admitting: Family Medicine

## 2019-03-25 DIAGNOSIS — R351 Nocturia: Secondary | ICD-10-CM

## 2019-05-02 ENCOUNTER — Telehealth: Payer: Self-pay | Admitting: *Deleted

## 2019-05-02 DIAGNOSIS — I10 Essential (primary) hypertension: Secondary | ICD-10-CM

## 2019-05-02 DIAGNOSIS — G4700 Insomnia, unspecified: Secondary | ICD-10-CM

## 2019-05-02 DIAGNOSIS — J470 Bronchiectasis with acute lower respiratory infection: Secondary | ICD-10-CM

## 2019-05-02 DIAGNOSIS — E785 Hyperlipidemia, unspecified: Secondary | ICD-10-CM

## 2019-05-02 MED ORDER — ALBUTEROL SULFATE HFA 108 (90 BASE) MCG/ACT IN AERS: 2.0000 | INHALATION_SPRAY | Freq: Four times a day (QID) | RESPIRATORY_TRACT | 99 refills | Status: DC | PRN

## 2019-05-02 MED ORDER — BISOPROLOL-HYDROCHLOROTHIAZIDE 10-6.25 MG PO TABS: 1.0000 | ORAL_TABLET | Freq: Every day | ORAL | 0 refills | Status: DC

## 2019-05-02 MED ORDER — TRAZODONE HCL 50 MG PO TABS: ORAL_TABLET | ORAL | 3 refills | Status: DC

## 2019-05-02 MED ORDER — ATORVASTATIN CALCIUM 40 MG PO TABS: 40.0000 mg | ORAL_TABLET | Freq: Every day | ORAL | 3 refills | Status: DC

## 2019-05-02 NOTE — Telephone Encounter (Signed)
Pt called requesting refills. These were sent .Victor Price, Victor Price

## 2019-05-16 ENCOUNTER — Other Ambulatory Visit: Payer: Self-pay | Admitting: Family Medicine

## 2019-05-16 NOTE — Telephone Encounter (Signed)
Called pt and lvm to advise him of Dr. Gardiner Ramus recommendations. And asked that he rtn call to schedule an appt.Elouise Munroe, [place]

## 2019-05-16 NOTE — Telephone Encounter (Signed)
Has been on the medication for almost 3 months.  Need to get him scheduled for an office visit and blood work.  He is a new start on testosterone.

## 2019-05-28 ENCOUNTER — Telehealth: Payer: Self-pay

## 2019-05-28 DIAGNOSIS — E291 Testicular hypofunction: Secondary | ICD-10-CM

## 2019-05-28 NOTE — Telephone Encounter (Signed)
Victor Price called for repeat testosterone labs. He will call back to schedule a follow up. Labs ordered.

## 2019-05-29 DIAGNOSIS — E291 Testicular hypofunction: Secondary | ICD-10-CM | POA: Diagnosis not present

## 2019-05-30 LAB — TESTOSTERONE: Testosterone: 329 ng/dL (ref 250–827)

## 2019-05-31 ENCOUNTER — Telehealth: Payer: Self-pay

## 2019-05-31 MED ORDER — TESTOSTERONE 10 MG/ACT (2%) TD GEL: TRANSDERMAL | 2 refills | Status: DC

## 2019-05-31 MED ORDER — SILDENAFIL CITRATE 50 MG PO TABS: 50.0000 mg | ORAL_TABLET | Freq: Every day | ORAL | 2 refills | Status: DC | PRN

## 2019-05-31 NOTE — Telephone Encounter (Signed)
Meds sent

## 2019-05-31 NOTE — Telephone Encounter (Signed)
Desman states he needs a change in his testosterone directions. He states he has been increased to 3 pumps daily. He would also like a refill on Viagra. Not on current medication list.

## 2019-06-05 ENCOUNTER — Encounter: Payer: Self-pay | Admitting: Family Medicine

## 2019-06-05 ENCOUNTER — Ambulatory Visit (INDEPENDENT_AMBULATORY_CARE_PROVIDER_SITE_OTHER): Payer: Medicare HMO | Admitting: Family Medicine

## 2019-06-05 VITALS — BP 131/74 | Ht 72.0 in | Wt 165.0 lb

## 2019-06-05 DIAGNOSIS — R7989 Other specified abnormal findings of blood chemistry: Secondary | ICD-10-CM | POA: Diagnosis not present

## 2019-06-05 DIAGNOSIS — M51369 Other intervertebral disc degeneration, lumbar region without mention of lumbar back pain or lower extremity pain: Secondary | ICD-10-CM | POA: Insufficient documentation

## 2019-06-05 DIAGNOSIS — E291 Testicular hypofunction: Secondary | ICD-10-CM | POA: Diagnosis not present

## 2019-06-05 DIAGNOSIS — N529 Male erectile dysfunction, unspecified: Secondary | ICD-10-CM | POA: Diagnosis not present

## 2019-06-05 DIAGNOSIS — M5136 Other intervertebral disc degeneration, lumbar region: Secondary | ICD-10-CM | POA: Insufficient documentation

## 2019-06-05 MED ORDER — LANSOPRAZOLE 30 MG PO CPDR: 30.0000 mg | DELAYED_RELEASE_CAPSULE | Freq: Every day | ORAL | 3 refills | Status: DC

## 2019-06-05 NOTE — Assessment & Plan Note (Addendum)
Continue current regimen. Due to recheck labs. Can adjust dose if needed. F/U in 6 mo

## 2019-06-05 NOTE — Assessment & Plan Note (Signed)
Will refer to Ortho for further workup and evaluation. Will likely need to get updated imaging with MRI.

## 2019-06-05 NOTE — Progress Notes (Signed)
He is doing well on current regimen. No questions or concerns at this time.Maryruth Eve, Lahoma Crocker, CMA

## 2019-06-05 NOTE — Assessment & Plan Note (Signed)
May need to call pharmacy. Not sure why they only gave him 4 tabs.

## 2019-06-05 NOTE — Progress Notes (Signed)
Virtual Visit via Video Note  I connected with Victor Price on 06/05/19 at  8:10 AM EDT by a video enabled telemedicine application and verified that I am speaking with the correct person using two identifiers.   I discussed the limitations of evaluation and management by telemedicine and the availability of in person appointments. The patient expressed understanding and agreed to proceed.    Established Patient Office Visit  Subjective:  Patient ID: Victor Price, male    DOB: 29-Jun-1951  Age: 68 y.o. MRN: [id number]  CC:  Chief Complaint  Patient presents with  . testosterone    HPI BEREN LINNEMANN presents for hypogonadism - 3 mo f/u. Has had more energy. He hasn't had any negative side effects. He is tolerating injection well.  Last labs in July when he started the medication.    Still having persistent low back pain on the left.radiating into his hip.    Now limping. He says he is extremely active and this is affecting his See prior note from last November. Just hasn't gotten better. He is at the point he would  Like to consider surgery.  He reports he has had prior back surgery.  F/U ED - only received 4 tabs of Viragra at the pharmacy.    Past Medical History:  Diagnosis Date  . Pneumonia    gets recurrent PNA in left lung    No past surgical history on file.  No family history on file.  Social History   Socioeconomic History  . Marital status: Single    Spouse name: Not on file  . Number of children: Not on file  . Years of education: Not on file  . Highest education level: Not on file  Occupational History  . Occupation: [place]: [employer]  . Financial resource strain: Not on file  . Food insecurity    Worry: Not on file    Inability: Not on file  . Transportation needs    Medical: Not on file    Non-medical: Not on file  Tobacco Use  . Smoking status: Never Smoker  . Smokeless tobacco: Never Used  Substance and Sexual  Activity  . Alcohol use: No  . Drug use: No  . Sexual activity: Not on file  Lifestyle  . Physical activity    Days per week: Not on file    Minutes per session: Not on file  . Stress: Not on file  Relationships  . Social Herbalist on phone: Not on file    Gets together: Not on file    Attends religious service: Not on file    Active member of club or organization: Not on file    Attends meetings of clubs or organizations: Not on file    Relationship status: Not on file  . Intimate partner violence    Fear of current or ex partner: Not on file    Emotionally abused: Not on file    Physically abused: Not on file    Forced sexual activity: Not on file  Other Topics Concern  . Not on file  Social History Narrative  . Not on file    Outpatient Medications Prior to Visit  Medication Sig Dispense Refill  . albuterol (VENTOLIN HFA) 108 (90 Base) MCG/ACT inhaler Inhale 2 puffs into the lungs every 6 (six) hours as needed for wheezing or shortness of breath. 18 g prn  . atorvastatin (LIPITOR)  40 MG tablet Take 1 tablet (40 mg total) by mouth at bedtime. 90 tablet 3  . bisoprolol-hydrochlorothiazide (ZIAC) 10-6.25 MG tablet Take 1 tablet by mouth daily. 90 tablet 0  . [employer] ALLERGY RELIEF-D 10-240 MG 24 hr tablet TAKE 1 TABLET BY MOUTH EVERY DAY 90 tablet 3  . sildenafil (VIAGRA) 50 MG tablet Take 1 tablet (50 mg total) by mouth daily as needed for erectile dysfunction. 30 tablet 2  . tamsulosin (FLOMAX) 0.4 MG CAPS capsule TAKE 1 CAPSULE (0.4 MG TOTAL) BY MOUTH DAILY AFTER SUPPER. 90 capsule 1  . Testosterone 10 MG/ACT (2%) GEL Apply 1 pump to an inner thigh and 2 pumps to opposite inner thigh in  AM. Harlene Salts. 120 g 2  . traZODone (DESYREL) 50 MG tablet TAKE 0.5-2 TABLETS (25-100 MG TOTAL) BY MOUTH AT BEDTIME AS NEEDED FOR SLEEP. 180 tablet 3  . lansoprazole (PREVACID) 30 MG capsule Take 1 capsule (30 mg total) by mouth daily. 90 capsule 3   No facility-administered  medications prior to visit.     No Known Allergies  ROS Review of Systems    Objective:    Physical Exam  BP 131/74   Ht 6' (1.829 m)   Wt 165 lb (74.8 kg)   BMI 22.38 kg/m  Wt Readings from Last 3 Encounters:  06/05/19 165 lb (74.8 kg)  03/01/19 170 lb (77.1 kg)  02/08/19 170 lb (77.1 kg)     There are no preventive care reminders to display for this patient.  There are no preventive care reminders to display for this patient.  Lab Results  Component Value Date   TSH 0.11 (L) 10/20/2017   Lab Results  Component Value Date   WBC 7.5 02/15/2019   HGB 13.1 (L) 02/15/2019   HCT 39.3 02/15/2019   MCV 93.6 02/15/2019   PLT 316 02/15/2019   Lab Results  Component Value Date   NA 141 02/06/2019   K 4.5 02/06/2019   CO2 25 02/06/2019   GLUCOSE 100 (H) 02/06/2019   BUN 19 02/06/2019   CREATININE 1.44 (H) 02/06/2019   BILITOT 0.9 06/27/2018   ALKPHOS 56 02/07/2017   AST 12 06/27/2018   ALT 27 06/27/2018   PROT 6.7 06/27/2018   ALBUMIN 4.2 02/07/2017   CALCIUM 9.9 02/06/2019   Lab Results  Component Value Date   CHOL 222 (H) 06/27/2018   Lab Results  Component Value Date   HDL 72 06/27/2018   Lab Results  Component Value Date   LDLCALC 120 (H) 06/27/2018   Lab Results  Component Value Date   TRIG 189 (H) 06/27/2018   Lab Results  Component Value Date   CHOLHDL 3.1 06/27/2018   No results found for: HGBA1C    Assessment & Plan:   Problem List Items Addressed This Visit      Endocrine   Hypogonadism in male - Primary    Continue current regimen. Due to recheck labs. Can adjust dose if needed. F/U in 6 mo      Relevant Orders   PSA   CBC   Testosterone Total,Free,Bio, Males     Musculoskeletal and Integument   DDD (degenerative disc disease), lumbar    Will refer to Ortho for further workup and evaluation. Will likely need to get updated imaging with MRI.       Relevant Orders   MR Lumbar Spine Wo Contrast     Other   ERECTILE  DYSFUNCTION, ORGANIC    May need to  call pharmacy. Not sure why they only gave him 4 tabs.           Meds ordered this encounter  Medications  . lansoprazole (PREVACID) 30 MG capsule    Sig: Take 1 capsule (30 mg total) by mouth daily.    Dispense:  90 capsule    Refill:  3    Follow-up: Return in about 6 months (around 12/04/2019), or if symptoms worsen or fail to improve.     I discussed the assessment and treatment plan with the patient. The patient was provided an opportunity to ask questions and all were answered. The patient agreed with the plan and demonstrated an understanding of the instructions.   The patient was advised to call back or seek an in-person evaluation if the symptoms worsen or if the condition fails to improve as anticipated.  Beatrice Lecher, MD

## 2019-06-11 ENCOUNTER — Other Ambulatory Visit: Payer: Self-pay

## 2019-06-11 ENCOUNTER — Other Ambulatory Visit: Payer: Self-pay | Admitting: *Deleted

## 2019-06-11 ENCOUNTER — Ambulatory Visit (INDEPENDENT_AMBULATORY_CARE_PROVIDER_SITE_OTHER): Payer: Medicare HMO

## 2019-06-11 DIAGNOSIS — N529 Male erectile dysfunction, unspecified: Secondary | ICD-10-CM

## 2019-06-11 DIAGNOSIS — M5136 Other intervertebral disc degeneration, lumbar region: Secondary | ICD-10-CM

## 2019-06-11 DIAGNOSIS — B351 Tinea unguium: Secondary | ICD-10-CM

## 2019-06-11 DIAGNOSIS — M545 Low back pain: Secondary | ICD-10-CM | POA: Diagnosis not present

## 2019-06-11 MED ORDER — TERBINAFINE HCL 250 MG PO TABS: 250.0000 mg | ORAL_TABLET | Freq: Every day | ORAL | 1 refills | Status: DC

## 2019-06-11 MED ORDER — SILDENAFIL CITRATE 20 MG PO TABS: 40.0000 mg | ORAL_TABLET | Freq: Every day | ORAL | 5 refills | Status: DC | PRN

## 2019-06-11 NOTE — Progress Notes (Signed)
Order placed for ortho referral.   

## 2019-06-12 ENCOUNTER — Telehealth: Payer: Self-pay | Admitting: Family Medicine

## 2019-06-12 NOTE — Telephone Encounter (Signed)
Received phone call in triage from [place].  Medicare does not cover any medications for Erectile Dysfunction.  Please advise of next steps.

## 2019-06-12 NOTE — Telephone Encounter (Signed)
Received fax for PA on Sildenafil Citrate 20 mg sent through cover my meds waiting on determination. - CF

## 2019-06-13 NOTE — Telephone Encounter (Signed)
Received fax from [place] stating that medications used for sexual or erectile dysfunction are excluded from the medicare D plan and cannot be covered. I am putting in providers box. - CF

## 2019-06-13 NOTE — Telephone Encounter (Signed)
LVM w/recommendations. Advised that he either rtn call or send a my chart about this.Maryruth Eve, Lahoma Crocker, CMA

## 2019-06-13 NOTE — Telephone Encounter (Signed)
He will have to pay cash if he wants them. Can use GoodRx

## 2019-06-13 NOTE — Telephone Encounter (Signed)
Pt advised- will get RX from [facility] for cash price

## 2019-07-03 ENCOUNTER — Encounter: Payer: Self-pay | Admitting: Orthopaedic Surgery

## 2019-07-03 ENCOUNTER — Ambulatory Visit: Payer: Medicare HMO | Admitting: Orthopaedic Surgery

## 2019-07-03 ENCOUNTER — Other Ambulatory Visit: Payer: Self-pay

## 2019-07-03 ENCOUNTER — Ambulatory Visit (INDEPENDENT_AMBULATORY_CARE_PROVIDER_SITE_OTHER): Payer: Medicare HMO

## 2019-07-03 DIAGNOSIS — M1612 Unilateral primary osteoarthritis, left hip: Secondary | ICD-10-CM

## 2019-07-03 DIAGNOSIS — M25552 Pain in left hip: Secondary | ICD-10-CM
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
## 2019-07-08 ENCOUNTER — Encounter: Payer: Self-pay | Admitting: Orthopaedic Surgery

## 2019-07-08 DIAGNOSIS — M1612 Unilateral primary osteoarthritis, left hip: Secondary | ICD-10-CM | POA: Insufficient documentation

## 2019-07-19 DIAGNOSIS — K219 Gastro-esophageal reflux disease without esophagitis: Secondary | ICD-10-CM | POA: Diagnosis not present

## 2019-07-19 DIAGNOSIS — N400 Enlarged prostate without lower urinary tract symptoms: Secondary | ICD-10-CM | POA: Diagnosis not present

## 2019-07-19 DIAGNOSIS — B351 Tinea unguium: Secondary | ICD-10-CM | POA: Diagnosis not present

## 2019-07-19 DIAGNOSIS — I10 Essential (primary) hypertension: Secondary | ICD-10-CM | POA: Diagnosis not present

## 2019-07-19 DIAGNOSIS — M1990 Unspecified osteoarthritis, unspecified site: Secondary | ICD-10-CM | POA: Diagnosis not present

## 2019-07-19 DIAGNOSIS — E785 Hyperlipidemia, unspecified: Secondary | ICD-10-CM | POA: Diagnosis not present

## 2019-07-21 ENCOUNTER — Other Ambulatory Visit: Payer: Self-pay | Admitting: Family Medicine

## 2019-07-21 DIAGNOSIS — I10 Essential (primary) hypertension: Secondary | ICD-10-CM

## 2019-07-21 DIAGNOSIS — E785 Hyperlipidemia, unspecified: Secondary | ICD-10-CM

## 2019-08-13 ENCOUNTER — Encounter: Payer: Self-pay | Admitting: Nurse Practitioner

## 2019-08-13 ENCOUNTER — Ambulatory Visit (INDEPENDENT_AMBULATORY_CARE_PROVIDER_SITE_OTHER): Payer: Medicare HMO | Admitting: Nurse Practitioner

## 2019-08-13 VITALS — BP 136/81 | Temp 99.0°F | Wt 170.0 lb

## 2019-08-13 DIAGNOSIS — J470 Bronchiectasis with acute lower respiratory infection: Secondary | ICD-10-CM

## 2019-08-13 HISTORY — DX: Bronchiectasis with acute lower respiratory infection: J47.0

## 2019-08-13 MED ORDER — PREDNISONE 20 MG PO TABS: 40.0000 mg | ORAL_TABLET | Freq: Every day | ORAL | 0 refills | Status: AC

## 2019-08-13 MED ORDER — DOXYCYCLINE HYCLATE 100 MG PO TABS: 100.0000 mg | ORAL_TABLET | Freq: Two times a day (BID) | ORAL | 0 refills | Status: DC

## 2019-08-13 NOTE — Progress Notes (Signed)
Virtual Visit via Video Note  I connected with Victor Price on 08/13/19 at  3:40 PM EST by the video enabled telemedicine application, Doximity, and verified that I am speaking with the correct person using two identifiers.   I discussed the limitations of evaluation and management by telemedicine and the availability of in person appointments. The patient expressed understanding and agreed to proceed.  Subjective:    CC: "I think I have pneumonia"  HPI: Victor Price is a 69 y.o. y/o male presenting via Doximity today for suspected lower respiratory infection. Pt reports a history of repeated left lower lobe pneumonia due to known bronchiectasis. He reports his symptoms started 3-4 days ago of productive cough with green sputum, fevers, and mild rhinorrhea. He also reports a specific taste associated with infection that he is experiencing. He is taking tylenol for the fevers. He would like to catch this infection as Kassadee Carawan as possible as he has a hip surgery scheduled for later in the month and he is hoping that this will not set him back.  Past medical history, Surgical history, Family history not pertinant except as noted below, Social history, Allergies, and medications have been entered into the medical record, reviewed, and corrections made.   Review of Systems:  General: No fatigue, night sweats, weight loss.   Neuro: No headache or change in mental status  HEENT: No sinus pain/pressure, ear pain/pressure, sore throat, difficulty swallowing, vision changes, epistaxis, loss of taste, loss of smell Pulmonary/CV: No shortness of breath, chest pain, edema, palpitations, syncope, audible wheezing GI: No abdominal pain, nausea, vomiting, diarrhea No known COVID-19 exposure  Objective:    General: Speaking clearly in complete sentences without any shortness of breath.  Alert and oriented x3.  Normal judgment. No apparent acute distress. Cough is present.  Video visit made physical exam  not possible.   Impression and Recommendations:    Problem List Items Addressed This Visit      Respiratory   Bronchiectasis ([place]) - Primary   Relevant Medications   doxycycline (VIBRA-TABS) 100 MG tablet   predniSONE (DELTASONE) 20 MG tablet     1. Bronchiectasis with acute lower respiratory infection ([place]) Pt reports successful treatment with doxycycline and prednisone in the past. Will treat empirically to avoid patient coming in for chest x-ray and risking potential exposure to COVID-19 prior to surgery. Pt instructed to contact orthopedic surgeon to make sure that taking this medication will not interfere with the plans for surgery.    - doxycycline (VIBRA-TABS) 100 MG tablet; Take 1 tablet (100 mg total) by mouth 2 (two) times daily.  Dispense: 10 tablet; Refill: 0 - predniSONE (DELTASONE) 20 MG tablet; Take 2 tablets (40 mg total) by mouth daily with breakfast for 5 days.  Dispense: 10 tablet; Refill: 0     I discussed the assessment and treatment plan with the patient. The patient was provided an opportunity to ask questions and all were answered. The patient agreed with the plan and demonstrated an understanding of the instructions.   The patient was advised to call back or seek an in-person evaluation if the symptoms worsen or if the condition fails to improve as anticipated.  15 non-face-to-face time was provided during this encounter.   Orma Render, NP

## 2019-08-13 NOTE — Patient Instructions (Addendum)
Thank you for letting me care for you today.   Please contact your orthopedic surgeon and let them know that you are starting doxycycline and prednisone for an acute lower respiratory infection.  Please take all of the antibiotic, even if you begin to feel better before you have finished the bottle.   Rest and stay hydrated and avoid going into public if at all possible. A weakened immune system makes you more vulnerable to COVID-19 and the flu.   Contact the office if your symptoms worsen or fail to improve

## 2019-08-15 ENCOUNTER — Other Ambulatory Visit: Payer: Self-pay

## 2019-08-15 ENCOUNTER — Encounter (HOSPITAL_COMMUNITY)
Admission: RE | Admit: 2019-08-15 | Discharge: 2019-08-15 | Disposition: A | Discharge status: Home / Self Care | Payer: Medicare HMO | Source: Ambulatory Visit | Attending: Orthopaedic Surgery | Admitting: Orthopaedic Surgery

## 2019-08-15 ENCOUNTER — Ambulatory Visit: Payer: Medicare HMO | Admitting: Surgery

## 2019-08-15 ENCOUNTER — Encounter: Payer: Self-pay | Admitting: Surgery

## 2019-08-15 ENCOUNTER — Encounter (HOSPITAL_COMMUNITY): Payer: Self-pay

## 2019-08-15 ENCOUNTER — Ambulatory Visit (HOSPITAL_COMMUNITY)
Admission: RE | Admit: 2019-08-15 | Discharge: 2019-08-15 | Disposition: A | Discharge status: Home / Self Care | Payer: Medicare HMO | Source: Ambulatory Visit | Attending: Surgery | Admitting: Surgery

## 2019-08-15 VITALS — BP 133/76 | HR 76 | Ht 72.0 in | Wt 165.4 lb

## 2019-08-15 DIAGNOSIS — Z01818 Encounter for other preprocedural examination: Secondary | ICD-10-CM | POA: Diagnosis not present

## 2019-08-15 DIAGNOSIS — M1612 Unilateral primary osteoarthritis, left hip: Secondary | ICD-10-CM

## 2019-08-15 HISTORY — DX: Male erectile dysfunction, unspecified: N52.9

## 2019-08-15 HISTORY — DX: Hyperlipidemia, unspecified: E78.5

## 2019-08-15 HISTORY — DX: Gastro-esophageal reflux disease without esophagitis: K21.9

## 2019-08-15 HISTORY — DX: Essential (primary) hypertension: I10

## 2019-08-15 HISTORY — DX: Other seasonal allergic rhinitis: J30.2

## 2019-08-15 HISTORY — DX: Bronchitis, not specified as acute or chronic: J40

## 2019-08-15 HISTORY — DX: Benign prostatic hyperplasia without lower urinary tract symptoms: N40.0

## 2019-08-15 LAB — COMPREHENSIVE METABOLIC PANEL
ALT: 23 U/L (ref 0–44)
AST: 18 U/L (ref 15–41)
Albumin: 3.8 g/dL (ref 3.5–5.0)
Alkaline Phosphatase: 75 U/L (ref 38–126)
Anion gap: 8 (ref 5–15)
BUN: 20 mg/dL (ref 8–23)
CO2: 26 mmol/L (ref 22–32)
Calcium: 9.5 mg/dL (ref 8.9–10.3)
Chloride: 103 mmol/L (ref 98–111)
Creatinine, Ser: 1.46 mg/dL — ABNORMAL HIGH (ref 0.61–1.24)
GFR calc Af Amer: 57 mL/min — ABNORMAL LOW (ref >60)
GFR calc non Af Amer: 49 mL/min — ABNORMAL LOW (ref >60)
Glucose, Bld: 119 mg/dL — ABNORMAL HIGH (ref 70–99)
Potassium: 4.5 mmol/L (ref 3.5–5.1)
Sodium: 137 mmol/L (ref 135–145)
Total Bilirubin: 0.6 mg/dL (ref 0.3–1.2)
Total Protein: 7.9 g/dL (ref 6.5–8.1)

## 2019-08-15 LAB — URINALYSIS, ROUTINE W REFLEX MICROSCOPIC
Bacteria, UA: NONE SEEN
Bilirubin Urine: NEGATIVE
Glucose, UA: NEGATIVE mg/dL
Hgb urine dipstick: NEGATIVE
Ketones, ur: NEGATIVE mg/dL
Leukocytes,Ua: NEGATIVE
Nitrite: NEGATIVE
Protein, ur: 30 mg/dL — AB
Specific Gravity, Urine: 1.017 (ref 1.005–1.030)
pH: 6 (ref 5.0–8.0)

## 2019-08-15 LAB — CBC
HCT: 43.7 % (ref 39.0–52.0)
Hemoglobin: 14.2 g/dL (ref 13.0–17.0)
MCH: 30.7 pg (ref 26.0–34.0)
MCHC: 32.5 g/dL (ref 30.0–36.0)
MCV: 94.6 fL (ref 80.0–100.0)
Platelets: 419 10*3/uL — ABNORMAL HIGH (ref 150–400)
RBC: 4.62 MIL/uL (ref 4.22–5.81)
RDW: 13.3 % (ref 11.5–15.5)
WBC: 11.5 10*3/uL — ABNORMAL HIGH (ref 4.0–10.5)
nRBC: 0 % (ref 0.0–0.2)

## 2019-08-15 LAB — TYPE AND SCREEN
ABO/RH(D): O POS
Antibody Screen: NEGATIVE

## 2019-08-15 LAB — ABO/RH: ABO/RH(D): O POS

## 2019-08-15 LAB — SURGICAL PCR SCREEN
MRSA, PCR: NEGATIVE
Staphylococcus aureus: NEGATIVE

## 2019-08-15 NOTE — Progress Notes (Signed)
69 year old white male with history of left hip DJD comes in for preop evaluation.  Left hip symptoms unchanged from previous visit.  He is want to proceed with left total hip replacement as scheduled.  I reviewed patient's notes in his chart.  He had a televisit with his family practice nurse practitioner August 13, 2019.  Patient complained of feeling like he had "pneumonia".  Patient was not brought into the office for exam.  Patient also did not have chest x-ray.  Nurse practitioner prescribed doxycycline and oral prednisone.  Patient states that he has been having a productive cough with green sputum.  History of bronchiectasis and multiple treatments for pneumonia in the past.  Currently denies any shortness of breath or chest pain.  Exam Pleasant white male alert and oriented in no acute distress.  Lungs he has few scattered wheezes bilaterally.  Left-sided rhonchi.     Plan We will see if we can get his preop labs and chest x-ray moved up.  Since he is currently being treated by his primary care office they will need to give [name] clearance before left hip surgery.  will have our surgery scheduler work on these.  Patient understands it may come down to him needing to postpone his surgery.

## 2019-08-15 NOTE — Progress Notes (Addendum)
[id number] [address], [address] [place] [address] Phone: [phone] Fax: [phone]      Your procedure is scheduled on Friday, 08/24/19.  Report to [facility] Main Entrance "A" at 5:30 A.M., and check in at the Admitting office.  Call this number if you have problems the morning of surgery:  [phone]  Call [phone] if you have any questions prior to your surgery date Monday-Friday 8am-4pm    Remember:  Do not eat after midnight the night before your surgery (Thurs)  You may drink clear liquids until 4:30 am the morning of your surgery.   Clear liquids allowed are: Water, Non-Citrus Juices (without pulp), Carbonated Beverages, Clear Tea, Black Coffee Only, and Gatorade   Please complete your PRE-SURGERY ENSURE that was provided to you by 4:30 am the morning of surgery.  Please, if able, drink it in one setting. DO NOT SIP.   Take these medicines the morning of surgery with A SIP OF WATER: bisoprolol-hydrochlorothiazide [facility])  [facility] lansoprazole (PREVACID) tamsulosin (FLOMAX) albuterol (VENTOLIN HFA)   7 days prior to surgery STOP taking any Aspirin (unless otherwise instructed by your surgeon), Aleve, Naproxen, Ibuprofen, Motrin, Advil, Goody's, BC's, all herbal medications, fish oil, and all vitamins.    The Morning of Surgery  Do not wear jewelry  Do not wear lotions, powders, colognes or deodorant  Men may shave face and neck.  Do not bring valuables to the hospital.  [facility] is not responsible for any belongings or valuables.  If you are a smoker, DO NOT Smoke 24 hours prior to surgery  If you wear a CPAP at night please bring your mask, tubing, and machine the morning of surgery   Remember that you must have someone to transport you home after your surgery, and remain with you for 24 hours if you are discharged the same day.   Please bring cases for contacts, glasses, hearing aids,  dentures or bridgework because it cannot be worn into surgery.    Leave your suitcase in the car.  After surgery it may be brought to your room.  For patients admitted to the hospital, discharge time will be determined by your treatment team.  Patients discharged the day of surgery will not be allowed to drive home.    Special instructions:   [place]- Preparing For Surgery  Before surgery, you can play an important role. Because skin is not sterile, your skin needs to be as free of germs as possible. You can reduce the number of germs on your skin by washing with CHG (chlorahexidine gluconate) Soap before surgery.  CHG is an antiseptic cleaner which kills germs and bonds with the skin to continue killing germs even after washing.    Oral Hygiene is also important to reduce your risk of infection.  Remember - BRUSH YOUR TEETH THE MORNING OF SURGERY WITH YOUR REGULAR TOOTHPASTE  Please do not use if you have an allergy to CHG or antibacterial soaps. If your skin becomes reddened/irritated stop using the CHG.  Do not shave (including legs and underarms) for at least 48 hours prior to first CHG shower. It is OK to shave your face.  Please follow these instructions carefully.   1. Shower the NIGHT BEFORE SURGERY Thurs and the MORNING OF SURGERY Fri with CHG Soap.   2. If you chose to wash your hair, wash your hair first as usual with your normal shampoo.  3. After  you shampoo, rinse your hair and body thoroughly to remove the shampoo.  4. Use CHG as you would any other liquid soap. You can apply CHG directly to the skin and wash gently with a scrungie or a clean washcloth.   5. Apply the CHG Soap to your body ONLY FROM THE NECK DOWN.  Do not use on open wounds or open sores. Avoid contact with your eyes, ears, mouth and genitals (private parts). Wash Face and genitals (private parts)  with your normal soap.   6. Wash thoroughly, paying special attention to the area where your surgery  will be performed.  7. Thoroughly rinse your body with warm water from the neck down.  8. DO NOT shower/wash with your normal soap after using and rinsing off the CHG Soap.  9. Pat yourself dry with a CLEAN TOWEL.  10. Wear CLEAN PAJAMAS to bed the night before surgery, wear comfortable clothes the morning of surgery  11. Place CLEAN SHEETS on your bed the night of your first shower and DO NOT SLEEP WITH PETS.    Day of Surgery:  Please shower the morning of surgery with the CHG soap Do not apply any deodorants/lotions. Please wear clean clothes to the hospital/surgery center.   Remember to brush your teeth WITH YOUR REGULAR TOOTHPASTE.   Please read over the following fact sheets that you were given.

## 2019-08-15 NOTE — Progress Notes (Signed)
Patient denies shortness of breath, fever, cough and chest pain.  PCP -  Dr Beatrice Lecher Cardiologist - denies  Chest x-ray - 08/15/19 EKG - 08/15/19 Stress Test - denies ECHO - denies Cardiac Cath - denies  ERAS: Clears til 4:30 am DOS, Ensure Drink given to be completed by 430 am DOS.  Anesthesia review: Yes, spoke with Ebony Hail, [place]   Coronavirus Screening Have you experienced the following symptoms:  Cough yes/no: No Fever (>100.71F)  yes/no: No Runny nose yes/no: No Sore throat yes/no: No Difficulty breathing/shortness of breath  yes/no: No  Have you traveled in the last 14 days and where? yes/no: No  Patient verbalized understanding of instructions that were given to them at the PAT appointment.

## 2019-08-16 ENCOUNTER — Telehealth: Payer: Self-pay | Admitting: Orthopaedic Surgery

## 2019-08-16 ENCOUNTER — Telehealth: Payer: Self-pay | Admitting: Family Medicine

## 2019-08-16 NOTE — Telephone Encounter (Signed)
Patient seen for H&P with Benjiman Core, PA.   Clearance was obtained from Dr. Beatrice Lecher 07-11-19, however since this date of receipt,  patient has been treated for a cough (08-13-19) and described feeling like he had pneumonia. I called PCP today and spoke with Janett Billow and requested a more recent clearance. She will forward request to the doctor or NP.

## 2019-08-16 NOTE — Telephone Encounter (Signed)
Patient is scheduled for total hip replacement surgery on january 15th at [facility] using anesthesia will be spinal. They have received clearance from PCP on December 2nd. However patient has developed a productive cough since taking the doxycycline and oral predniSONE but patient feels like he has pneumonia but he has a history of this. Dr.Yates would like to know if the clearance is still good. They would like to know if patient is still cleared and good since that January 4th virtual visit. If a new clearance note is to be sent please fax attention to: Roanna Banning number: [phone]

## 2019-08-17 NOTE — Telephone Encounter (Signed)
Attempted to reach patient to schedule this appointment. Left voicemail for patient to call us back to make this appointment.

## 2019-08-17 NOTE — Progress Notes (Addendum)
Anesthesia Chart Review:  Case: [id number] Date/Time: 08/24/19 0715   Procedure: LEFT TOTAL HIP ARTHROPLASTY-DIRECT ANTERIOR (Left Hip)   Anesthesia type: Spinal   Pre-op diagnosis: right hip osteoarthritis   Location: [facility] OR ROOM 04 / [place] [facility]   Surgeons: Marybelle Killings, MD      DISCUSSION: Patient is a 69 year old male scheduled for the above procedure.  History includes never smoker, HTN, GERD, ED, bronchiectasis (05/16/17 CT) with recurrent pneumonia, back surgery (1980's). Labs suggest CKD (Cr 1.43-1.80 since 07/2016) that is followed by his PCP Dr. Madilyn Fireman.   He reported that bronchiectasis is managed by his PCP Dr. Madilyn Fireman. He uses a bronchodilator as needed and is sure to seek evaluation if he develops symptoms of an exacerbation, but otherwise in now requiring chronic antibiotics, home O2, or routine chest percussion.   He was last evaluated (telemedicine) on 08/13/19 by Jacolyn Reedy, NP due to concern for possible early pneumonia due to symptoms of productive cough x3-4 days, fever, and rhinorrhea.  He is prone to recurrent pneumonia with history of bronchiectasis and wanted to start therapy soon as possible given his upcoming surgery. He was started on doxycycline and prednisone empirically with plans for preoperative CXR and COVID-19 test. He had a few scattered wheezes when he was evaluated by Esaw Grandchild, so PCP follow-up requested prior to surgery to ensure pulmonary status felt stable/recovered for surgery. CXR on 08/15/19 showed no acute process ("Mild coarsened interstitial markings similar to prior"). Afebrile with O2 sat on RA 100% at PAT. He denied chest pain and SOB at PAT RN visit.   Pre-surgical COVID-19 test is scheduled for 08/21/19. Chart will be left for follow-up regarding PCP follow-up evaluation.    ADDENDUM 08/20/19 5:00 PM:  Patient had re-evaluation by Jacolyn Reedy, NP earlier today. She wrote:  "1. Follow up Respiratory symptoms have resolved. Patient is at baseline  and eager to have surgery on his hip at the end of this week. Education on deep breathing and moving as soon as possible after surgery to prevent build up of fluid in the lungs post-op.  2. Preoperative clearance Slight rhonchi noted in the left lower lobe, which is baseline per patient. CXR from 08/15/2019 reviewed- no acute cardiopulmonary disease noted. Vital signs stable today. All symptoms of lower respiratory infection appear to have resolved. From my stand point, patient is clear for surgery."    VS: BP (!) 143/79   Pulse 68   Temp 36.9 C (Oral)   Resp 18   Ht 6' (1.829 m)   Wt 74.6 kg   SpO2 100%   BMI 22.30 kg/m   PROVIDERS: Hali Marry, MD is PCP    LABS: Labs reviewed: Acceptable for surgery. Cr 1.46, but appears consistent with previous results (Cr 1.43-1.80 since 07/2016). (all labs ordered are listed, but only abnormal results are displayed)  Labs Reviewed  CBC - Abnormal; Notable for the following components:      Result Value   WBC 11.5 (*)    Platelets 419 (*)    All other components within normal limits  COMPREHENSIVE METABOLIC PANEL - Abnormal; Notable for the following components:   Glucose, Bld 119 (*)    Creatinine, Ser 1.46 (*)    GFR calc non Af Amer 49 (*)    GFR calc Af Amer 57 (*)    All other components within normal limits  URINALYSIS, ROUTINE W REFLEX MICROSCOPIC - Abnormal; Notable for the following components:   Protein, ur 30 (*)  All other components within normal limits  SURGICAL PCR SCREEN  TYPE AND SCREEN  ABO/RH     IMAGES: CXR 08/15/19: FINDINGS: - Cardiomediastinal silhouette unchanged in size and contour. - No pneumothorax or pleural effusion. No confluent airspace disease. Mild coarsened interstitial markings similar to prior. No displaced fracture. IMPRESSION: Negative for acute cardiopulmonary disease   MRI L-spine 06/11/19: IMPRESSION: - Lumbar spondylosis as detailed [See full report in Results Review  for details]. - No more than mild central canal stenosis. Multilevel multifactorial subarticular stenosis, most notable bilaterally at L4-L5 and on the right at L5-S1. Crowding of the bilateral descending L5 nerve roots at L4-L5. At L5-S1, probable postoperative changes with fat signal extending from the region of the right L5 lamina into the right subarticular zone, contributing to right subarticular stenosis with encroachment upon the descending right S1 nerve root. - Mild multilevel neural foraminal narrowing as described.   Chest CT 05/17/07: IMPRESSION: Bronchiectasis with airway debris and associated patchy airspace disease in the posterolateral lung base. Chronic or atypical infection is a consideration. Aspiration could have this appearance but more commonly occurs in the right lower lobe than the left.   EKG: 08/15/19: Normal sinus rhythm Non-specific intra-ventricular conduction delay Borderline ECG Confirmed by Loralie Champagne ([id number]) on 08/15/2019 8:59:20 PM   CV: N/A   Past Medical History:  Diagnosis Date  . BPH (benign prostatic hyperplasia)   . Bronchiectasis with acute lower respiratory infection ([place]) 08/13/2019  . Bronchitis    inhaler prn  . ED (erectile dysfunction)   . GERD (gastroesophageal reflux disease)   . HLD (hyperlipidemia)   . Hypertension   . Pneumonia    gets recurrent PNA in left lung, recent dx 08/13/19 left lower lobe  . Seasonal allergies     Past Surgical History:  Procedure Laterality Date  . BACK SURGERY  1980s   [facility]  . COLONOSCOPY  2004, 2012   several - polyps  . MULTIPLE TOOTH EXTRACTIONS      MEDICATIONS: . acetaminophen (TYLENOL) 650 MG CR tablet  . albuterol (VENTOLIN HFA) 108 (90 Base) MCG/ACT inhaler  . atorvastatin (LIPITOR) 40 MG tablet  . bisoprolol-hydrochlorothiazide (ZIAC) 10-6.25 MG tablet  . [employer] ALLERGY RELIEF-D 10-240 MG 24 hr tablet  . doxycycline (VIBRA-TABS) 100 MG tablet  . lansoprazole (PREVACID) 30  MG capsule  . predniSONE (DELTASONE) 20 MG tablet  . sildenafil (REVATIO) 20 MG tablet  . tamsulosin (FLOMAX) 0.4 MG CAPS capsule  . terbinafine (LAMISIL) 250 MG tablet  . Testosterone 10 MG/ACT (2%) GEL  . traZODone (DESYREL) 50 MG tablet   No current facility-administered medications for this encounter.    Myra Gianotti, PA-C Surgical Short Stay/Anesthesiology [facility] Phone [phone] [facility] Phone [phone] 08/17/2019 5:15 PM

## 2019-08-17 NOTE — Telephone Encounter (Signed)
Ok to schedule today or Monday with a provider for lung check ot make sure he is safe for surgery.

## 2019-08-20 ENCOUNTER — Ambulatory Visit (INDEPENDENT_AMBULATORY_CARE_PROVIDER_SITE_OTHER): Payer: Medicare HMO | Admitting: Nurse Practitioner

## 2019-08-20 ENCOUNTER — Encounter: Payer: Self-pay | Admitting: Nurse Practitioner

## 2019-08-20 ENCOUNTER — Other Ambulatory Visit: Payer: Self-pay

## 2019-08-20 VITALS — BP 119/67 | HR 74 | Temp 97.4°F | Ht 72.0 in | Wt 164.0 lb

## 2019-08-20 DIAGNOSIS — Z09 Encounter for follow-up examination after completed treatment for conditions other than malignant neoplasm: Secondary | ICD-10-CM

## 2019-08-20 DIAGNOSIS — Z01818 Encounter for other preprocedural examination: Secondary | ICD-10-CM

## 2019-08-20 NOTE — Telephone Encounter (Signed)
Appointment has been made for today.  °

## 2019-08-20 NOTE — Anesthesia Preprocedure Evaluation (Addendum)
Anesthesia Evaluation  Patient identified by MRN, date of birth, ID band Patient awake    Reviewed: Allergy & Precautions, NPO status , Patient's Chart, lab work & pertinent test results  Airway Mallampati: II  TM Distance: >3 FB Neck ROM: Full    Dental no notable dental hx.    Pulmonary neg pulmonary ROS,    Pulmonary exam normal breath sounds clear to auscultation       Cardiovascular hypertension, Pt. on medications negative cardio ROS Normal cardiovascular exam Rhythm:Regular Rate:Normal     Neuro/Psych negative neurological ROS  negative psych ROS   GI/Hepatic Neg liver ROS, GERD  ,  Endo/Other  negative endocrine ROS  Renal/GU Renal InsufficiencyRenal disease  negative genitourinary   Musculoskeletal  (+) Arthritis , Osteoarthritis,    Abdominal   Peds negative pediatric ROS (+)  Hematology negative hematology ROS (+)   Anesthesia Other Findings   Reproductive/Obstetrics negative OB ROS                             Anesthesia Physical Anesthesia Plan  ASA: II  Anesthesia Plan: Spinal   Post-op Pain Management:    Induction: Intravenous  PONV Risk Score and Plan: 1 and Ondansetron and Treatment may vary due to age or medical condition  Airway Management Planned: Simple Face Mask  Additional Equipment:   Intra-op Plan:   Post-operative Plan: Extubation in OR  Informed Consent: I have reviewed the patients History and Physical, chart, labs and discussed the procedure including the risks, benefits and alternatives for the proposed anesthesia with the patient or authorized representative who has indicated his/her understanding and acceptance.     Dental advisory given  Plan Discussed with: CRNA  Anesthesia Plan Comments: (PAT note written by Myra Gianotti, PA-C. )       Anesthesia Quick Evaluation

## 2019-08-20 NOTE — Progress Notes (Signed)
Acute Office Visit  Subjective:    Patient ID: Victor Price, male    DOB: [birth year], 69 y.o.   MRN: [id number]  Chief Complaint  Patient presents with  . Surgical Clearance    left total hip replacement surgery scheduled for 08/24/2019    HPI  Patient is in today for surgical clearance for Left Hip Arthroplasty after recent treatment for lower respiratory infection.  He reports his cough is much improved, but he always has "some" cough. The gurgling in his lungs has subsided. He finished his antibiotic and prednisone burst. He is not experiencing any fevers. He reports feeling at his baseline.  Past Medical History:  Diagnosis Date  . BPH (benign prostatic hyperplasia)   . Bronchiectasis with acute lower respiratory infection ([place]) 08/13/2019  . Bronchitis    inhaler prn  . ED (erectile dysfunction)   . GERD (gastroesophageal reflux disease)   . HLD (hyperlipidemia)   . Hypertension   . Pneumonia    gets recurrent PNA in left lung, recent dx 08/13/19 left lower lobe  . Seasonal allergies     Past Surgical History:  Procedure Laterality Date  . BACK SURGERY  1980s   [facility]  . COLONOSCOPY  2004, 2012   several - polyps  . MULTIPLE TOOTH EXTRACTIONS      History reviewed. No pertinent family history.  Social History   Socioeconomic History  . Marital status: Single    Spouse name: Not on file  . Number of children: Not on file  . Years of education: Not on file  . Highest education level: Not on file  Occupational History  . Occupation: [employer]    Employer: [employer]  Tobacco Use  . Smoking status: Never Smoker  . Smokeless tobacco: Never Used  Substance and Sexual Activity  . Alcohol use: No  . Drug use: No  . Sexual activity: Yes  Other Topics Concern  . Not on file  Social History Narrative  . Not on file   Social Determinants of Health   Financial Resource Strain:   . Difficulty of Paying Living Expenses: Not on file  Food Insecurity:   .  Worried About Charity fundraiser in the Last Year: Not on file  . Ran Out of Food in the Last Year: Not on file  Transportation Needs:   . Lack of Transportation (Medical): Not on file  . Lack of Transportation (Non-Medical): Not on file  Physical Activity:   . Days of Exercise per Week: Not on file  . Minutes of Exercise per Session: Not on file  Stress:   . Feeling of Stress : Not on file  Social Connections:   . Frequency of Communication with Friends and Family: Not on file  . Frequency of Social Gatherings with Friends and Family: Not on file  . Attends Religious Services: Not on file  . Active Member of Clubs or Organizations: Not on file  . Attends Archivist Meetings: Not on file  . Marital Status: Not on file  Intimate Partner Violence:   . Fear of Current or Ex-Partner: Not on file  . Emotionally Abused: Not on file  . Physically Abused: Not on file  . Sexually Abused: Not on file    Outpatient Medications Prior to Visit  Medication Sig Dispense Refill  . acetaminophen (TYLENOL) 650 MG CR tablet Take 1,300 mg by mouth every 8 (eight) hours as needed for pain.    Marland Kitchen albuterol (VENTOLIN HFA)  108 (90 Base) MCG/ACT inhaler Inhale 2 puffs into the lungs every 6 (six) hours as needed for wheezing or shortness of breath. 18 g prn  . atorvastatin (LIPITOR) 40 MG tablet Take 1 tablet (40 mg total) by mouth at bedtime. 90 tablet 3  . bisoprolol-hydrochlorothiazide (ZIAC) 10-6.25 MG tablet TAKE 1 TABLET BY MOUTH EVERY DAY (Patient taking differently: Take 1 tablet by mouth daily. ) 90 tablet 0  . [employer] ALLERGY RELIEF-D 10-240 MG 24 hr tablet TAKE 1 TABLET BY MOUTH EVERY DAY (Patient taking differently: Take 1 tablet by mouth daily. ) 90 tablet 3  . lansoprazole (PREVACID) 30 MG capsule Take 1 capsule (30 mg total) by mouth daily. 90 capsule 3  . sildenafil (REVATIO) 20 MG tablet Take 2-5 tablets (40-100 mg total) by mouth daily as needed. 40 tablet 5  . tamsulosin (FLOMAX) 0.4  MG CAPS capsule TAKE 1 CAPSULE (0.4 MG TOTAL) BY MOUTH DAILY AFTER SUPPER. 90 capsule 1  . terbinafine (LAMISIL) 250 MG tablet Take 1 tablet (250 mg total) by mouth daily. 90 tablet 1  . Testosterone 10 MG/ACT (2%) GEL Apply 1 pump to an inner thigh and 2 pumps to opposite inner thigh in  AM. Harlene Salts. (Patient taking differently: Apply 1-2 Pump topically See admin instructions. Apply 1 pump to an inner thigh and 2 pumps to opposite inner thigh in  AM. Harlene Salts.) 120 g 2  . traZODone (DESYREL) 50 MG tablet TAKE 0.5-2 TABLETS (25-100 MG TOTAL) BY MOUTH AT BEDTIME AS NEEDED FOR SLEEP. (Patient taking differently: Take 25-100 mg by mouth at bedtime as needed for sleep. ) 180 tablet 3  . doxycycline (VIBRA-TABS) 100 MG tablet Take 1 tablet (100 mg total) by mouth 2 (two) times daily. (Patient not taking: Reported on 08/20/2019) 10 tablet 0   No facility-administered medications prior to visit.    No Known Allergies  Review of Systems  Constitutional: Negative for chills, fatigue and fever.  HENT: Negative for congestion.   Respiratory: Positive for cough. Negative for chest tightness, shortness of breath and wheezing.        Cough at baseline  Cardiovascular: Negative for chest pain.       Objective:    Physical Exam Constitutional:      Appearance: Normal appearance.  Cardiovascular:     Rate and Rhythm: Normal rate and regular rhythm.     Pulses: Normal pulses.     Heart sounds: Normal heart sounds.  Pulmonary:     Effort: Pulmonary effort is normal.     Comments: Slight rhonchi auscultated in left lower lobe- pt reports this is baseline.  All other lung sounds WNL Abdominal:     General: Abdomen is flat. Bowel sounds are normal.  Skin:    General: Skin is warm and dry.  Neurological:     Mental Status: He is alert.  Psychiatric:        Mood and Affect: Mood normal.        Behavior: Behavior normal.     BP 119/67   Pulse 74   Temp (!) 97.4 F (36.3 C) (Oral)   Ht 6'  (1.829 m)   Wt 164 lb 0.6 oz (74.4 kg)   SpO2 100%   BMI 22.25 kg/m  Wt Readings from Last 3 Encounters:  08/20/19 164 lb 0.6 oz (74.4 kg)  08/15/19 164 lb 7 oz (74.6 kg)  08/15/19 165 lb 6.4 oz (75 kg)    There are no preventive care reminders to display  for this patient.  There are no preventive care reminders to display for this patient.   Lab Results  Component Value Date   TSH 0.11 (L) 10/20/2017   Lab Results  Component Value Date   WBC 11.5 (H) 08/15/2019   HGB 14.2 08/15/2019   HCT 43.7 08/15/2019   MCV 94.6 08/15/2019   PLT 419 (H) 08/15/2019   Lab Results  Component Value Date   NA 137 08/15/2019   K 4.5 08/15/2019   CO2 26 08/15/2019   GLUCOSE 119 (H) 08/15/2019   BUN 20 08/15/2019   CREATININE 1.46 (H) 08/15/2019   BILITOT 0.6 08/15/2019   ALKPHOS 75 08/15/2019   AST 18 08/15/2019   ALT 23 08/15/2019   PROT 7.9 08/15/2019   ALBUMIN 3.8 08/15/2019   CALCIUM 9.5 08/15/2019   ANIONGAP 8 08/15/2019   Lab Results  Component Value Date   CHOL 222 (H) 06/27/2018   Lab Results  Component Value Date   HDL 72 06/27/2018   Lab Results  Component Value Date   LDLCALC 120 (H) 06/27/2018   Lab Results  Component Value Date   TRIG 189 (H) 06/27/2018   Lab Results  Component Value Date   CHOLHDL 3.1 06/27/2018   No results found for: HGBA1C     Assessment & Plan:   1. Follow up Respiratory symptoms have resolved. Patient is at baseline and eager to have surgery on his hip at the end of this week. Education on deep breathing and moving as soon as possible after surgery to prevent build up of fluid in the lungs post-op.  2. Preoperative clearance Slight rhonchi noted in the left lower lobe, which is baseline per patient. CXR from 08/15/2019 reviewed- no acute cardiopulmonary disease noted. Vital signs stable today. All symptoms of lower respiratory infection appear to have resolved. From my stand point, patient is clear for surgery. Letter sent to Dr.  Rodell Perna, MD.  Orma Render, NP

## 2019-08-20 NOTE — Patient Instructions (Addendum)
Great luck on your surgery!!  Make sure to use your deep breathing exercises pre and post op to help prevent infection and fluid in your lungs.

## 2019-08-21 ENCOUNTER — Other Ambulatory Visit (HOSPITAL_COMMUNITY)
Admission: RE | Admit: 2019-08-21 | Discharge: 2019-08-21 | Disposition: A | Discharge status: Home / Self Care | Payer: Medicare HMO | Source: Ambulatory Visit | Attending: Orthopaedic Surgery | Admitting: Orthopaedic Surgery

## 2019-08-21 DIAGNOSIS — Z01812 Encounter for preprocedural laboratory examination: Secondary | ICD-10-CM | POA: Diagnosis not present

## 2019-08-21 DIAGNOSIS — Z20822 Contact with and (suspected) exposure to covid-19: Secondary | ICD-10-CM | POA: Diagnosis not present

## 2019-08-22 LAB — NOVEL CORONAVIRUS, NAA (HOSP ORDER, SEND-OUT TO REF LAB; TAT 18-24 HRS): SARS-CoV-2, NAA: NOT DETECTED

## 2019-08-23 ENCOUNTER — Other Ambulatory Visit: Payer: Self-pay | Admitting: Family Medicine

## 2019-08-23 MED ORDER — BUPIVACAINE LIPOSOME 1.3 % IJ SUSP
10.0000 mL | INTRAMUSCULAR | Status: AC
  Administered 2019-08-24: 20 mL
  Filled 2019-08-23: qty 10

## 2019-08-24 ENCOUNTER — Encounter (HOSPITAL_COMMUNITY): Payer: Self-pay | Admitting: Orthopaedic Surgery

## 2019-08-24 ENCOUNTER — Other Ambulatory Visit: Payer: Self-pay

## 2019-08-24 ENCOUNTER — Ambulatory Visit (HOSPITAL_COMMUNITY): Payer: Medicare HMO | Admitting: Vascular Surgery

## 2019-08-24 ENCOUNTER — Ambulatory Visit (HOSPITAL_COMMUNITY): Payer: Medicare HMO

## 2019-08-24 ENCOUNTER — Ambulatory Visit (HOSPITAL_COMMUNITY)
Admission: RE | Admit: 2019-08-24 | Discharge: 2019-08-24 | Disposition: A | Discharge status: Home / Self Care | Payer: Medicare HMO | Attending: Orthopaedic Surgery | Admitting: Orthopaedic Surgery

## 2019-08-24 ENCOUNTER — Encounter (HOSPITAL_COMMUNITY)
Admission: RE | Disposition: A | Discharge status: Home / Self Care | Payer: Self-pay | Source: Home / Self Care | Attending: Orthopaedic Surgery

## 2019-08-24 ENCOUNTER — Ambulatory Visit (HOSPITAL_COMMUNITY): Payer: Medicare HMO | Admitting: Anesthesiology

## 2019-08-24 DIAGNOSIS — E785 Hyperlipidemia, unspecified: Secondary | ICD-10-CM | POA: Diagnosis not present

## 2019-08-24 DIAGNOSIS — Z79899 Other long term (current) drug therapy: Secondary | ICD-10-CM | POA: Diagnosis not present

## 2019-08-24 DIAGNOSIS — K219 Gastro-esophageal reflux disease without esophagitis: Secondary | ICD-10-CM | POA: Diagnosis not present

## 2019-08-24 DIAGNOSIS — G629 Polyneuropathy, unspecified: Secondary | ICD-10-CM | POA: Insufficient documentation

## 2019-08-24 DIAGNOSIS — I129 Hypertensive chronic kidney disease with stage 1 through stage 4 chronic kidney disease, or unspecified chronic kidney disease: Secondary | ICD-10-CM | POA: Insufficient documentation

## 2019-08-24 DIAGNOSIS — N400 Enlarged prostate without lower urinary tract symptoms: Secondary | ICD-10-CM | POA: Diagnosis not present

## 2019-08-24 DIAGNOSIS — N183 Chronic kidney disease, stage 3 unspecified: Secondary | ICD-10-CM | POA: Diagnosis not present

## 2019-08-24 DIAGNOSIS — M1612 Unilateral primary osteoarthritis, left hip: Secondary | ICD-10-CM

## 2019-08-24 DIAGNOSIS — Z96642 Presence of left artificial hip joint: Secondary | ICD-10-CM | POA: Diagnosis not present

## 2019-08-24 DIAGNOSIS — R262 Difficulty in walking, not elsewhere classified: Secondary | ICD-10-CM | POA: Insufficient documentation

## 2019-08-24 DIAGNOSIS — Z419 Encounter for procedure for purposes other than remedying health state, unspecified: Secondary | ICD-10-CM

## 2019-08-24 DIAGNOSIS — G4700 Insomnia, unspecified: Secondary | ICD-10-CM | POA: Insufficient documentation

## 2019-08-24 DIAGNOSIS — Z471 Aftercare following joint replacement surgery: Secondary | ICD-10-CM | POA: Diagnosis not present

## 2019-08-24 HISTORY — PX: TOTAL HIP ARTHROPLASTY: SHX124

## 2019-08-24 SURGERY — ARTHROPLASTY, HIP, TOTAL, ANTERIOR APPROACH
Anesthesia: Spinal | Site: Hip | Laterality: Left

## 2019-08-24 MED ORDER — BUPIVACAINE HCL (PF) 0.75 % IJ SOLN
INTRAMUSCULAR | Status: DC | PRN
  Administered 2019-08-24: 1.8 mL via INTRATHECAL

## 2019-08-24 MED ORDER — BISOPROLOL-HYDROCHLOROTHIAZIDE 10-6.25 MG PO TABS: 1.0000 | ORAL_TABLET | Freq: Every day | ORAL | Status: DC

## 2019-08-24 MED ORDER — PANTOPRAZOLE SODIUM 20 MG PO TBEC: 20.0000 mg | DELAYED_RELEASE_TABLET | Freq: Every day | ORAL | Status: DC

## 2019-08-24 MED ORDER — ATORVASTATIN CALCIUM 40 MG PO TABS: 40.0000 mg | ORAL_TABLET | Freq: Every day | ORAL | Status: DC

## 2019-08-24 MED ORDER — OXYCODONE HCL 5 MG PO TABS
ORAL_TABLET | ORAL | Status: AC
  Administered 2019-08-24: 5 mg via ORAL
  Filled 2019-08-24: qty 1

## 2019-08-24 MED ORDER — TRAZODONE 25 MG HALF TABLET: 25.0000 mg | ORAL_TABLET | Freq: Every evening | ORAL | Status: DC | PRN

## 2019-08-24 MED ORDER — DOXYCYCLINE HYCLATE 100 MG PO TABS: 100.0000 mg | ORAL_TABLET | Freq: Two times a day (BID) | ORAL | Status: DC

## 2019-08-24 MED ORDER — METHOCARBAMOL 500 MG PO TABS
ORAL_TABLET | ORAL | Status: AC
  Filled 2019-08-24: qty 1

## 2019-08-24 MED ORDER — ASPIRIN EC 325 MG PO TBEC: 325.0000 mg | DELAYED_RELEASE_TABLET | Freq: Every day | ORAL | 0 refills | Status: DC

## 2019-08-24 MED ORDER — OXYCODONE-ACETAMINOPHEN 7.5-325 MG PO TABS: 1.0000 | ORAL_TABLET | ORAL | Status: DC | PRN

## 2019-08-24 MED ORDER — ACETAMINOPHEN 10 MG/ML IV SOLN
INTRAVENOUS | Status: AC
  Filled 2019-08-24: qty 100

## 2019-08-24 MED ORDER — OXYCODONE-ACETAMINOPHEN 7.5-325 MG PO TABS
1.0000 | ORAL_TABLET | Freq: Once | ORAL | Status: AC
  Administered 2019-08-24: 1 via ORAL

## 2019-08-24 MED ORDER — CEFAZOLIN SODIUM-DEXTROSE 2-4 GM/100ML-% IV SOLN
2.0000 g | INTRAVENOUS | Status: AC
  Administered 2019-08-24: 2 g via INTRAVENOUS
  Filled 2019-08-24: qty 100

## 2019-08-24 MED ORDER — LORATADINE-PSEUDOEPHEDRINE ER 10-240 MG PO TB24: 1.0000 | ORAL_TABLET | Freq: Every day | ORAL | Status: DC

## 2019-08-24 MED ORDER — HYDROMORPHONE HCL 1 MG/ML IJ SOLN
0.2500 mg | INTRAMUSCULAR | Status: DC | PRN
  Administered 2019-08-24: 0.5 mg via INTRAVENOUS

## 2019-08-24 MED ORDER — MIDAZOLAM HCL 5 MG/5ML IJ SOLN
INTRAMUSCULAR | Status: DC | PRN
  Administered 2019-08-24: 2 mg via INTRAVENOUS

## 2019-08-24 MED ORDER — ONDANSETRON HCL 4 MG/2ML IJ SOLN
INTRAMUSCULAR | Status: AC
  Administered 2019-08-24: 4 mg via INTRAVENOUS
  Filled 2019-08-24: qty 2

## 2019-08-24 MED ORDER — BUPIVACAINE HCL (PF) 0.25 % IJ SOLN
INTRAMUSCULAR | Status: DC | PRN
  Administered 2019-08-24: 20 mL

## 2019-08-24 MED ORDER — ALBUTEROL SULFATE HFA 108 (90 BASE) MCG/ACT IN AERS: 2.0000 | INHALATION_SPRAY | Freq: Four times a day (QID) | RESPIRATORY_TRACT | Status: DC | PRN

## 2019-08-24 MED ORDER — LACTATED RINGERS IV SOLN: INTRAVENOUS | Status: DC | PRN

## 2019-08-24 MED ORDER — TRANEXAMIC ACID 1000 MG/10ML IV SOLN
INTRAVENOUS | Status: DC | PRN
  Administered 2019-08-24: 08:00:00 2000 mg via TOPICAL

## 2019-08-24 MED ORDER — OXYCODONE HCL 5 MG PO TABS: 5.0000 mg | ORAL_TABLET | Freq: Once | ORAL | Status: AC | PRN

## 2019-08-24 MED ORDER — FENTANYL CITRATE (PF) 100 MCG/2ML IJ SOLN
INTRAMUSCULAR | Status: DC | PRN
  Administered 2019-08-24: 100 ug via INTRAVENOUS

## 2019-08-24 MED ORDER — CHLORHEXIDINE GLUCONATE 4 % EX LIQD: 60.0000 mL | Freq: Once | CUTANEOUS | Status: DC

## 2019-08-24 MED ORDER — METHOCARBAMOL 500 MG PO TABS
500.0000 mg | ORAL_TABLET | Freq: Once | ORAL | Status: AC
  Administered 2019-08-24: 500 mg via ORAL

## 2019-08-24 MED ORDER — CEFAZOLIN SODIUM-DEXTROSE 2-4 GM/100ML-% IV SOLN
INTRAVENOUS | Status: AC
  Filled 2019-08-24: qty 100

## 2019-08-24 MED ORDER — 0.9 % SODIUM CHLORIDE (POUR BTL) OPTIME
TOPICAL | Status: DC | PRN
  Administered 2019-08-24: 07:00:00 1000 mL

## 2019-08-24 MED ORDER — TRANEXAMIC ACID 1000 MG/10ML IV SOLN
2000.0000 mg | Freq: Once | INTRAVENOUS | Status: DC
  Filled 2019-08-24: qty 20

## 2019-08-24 MED ORDER — PROPOFOL 500 MG/50ML IV EMUL
INTRAVENOUS | Status: DC | PRN
  Administered 2019-08-24: 25 ug/kg/min via INTRAVENOUS

## 2019-08-24 MED ORDER — OXYCODONE-ACETAMINOPHEN 7.5-325 MG PO TABS: 1.0000 | ORAL_TABLET | Freq: Four times a day (QID) | ORAL | 0 refills | Status: DC | PRN

## 2019-08-24 MED ORDER — BUPIVACAINE HCL (PF) 0.25 % IJ SOLN
INTRAMUSCULAR | Status: AC
  Filled 2019-08-24: qty 30

## 2019-08-24 MED ORDER — TAMSULOSIN HCL 0.4 MG PO CAPS: 0.4000 mg | ORAL_CAPSULE | Freq: Every day | ORAL | Status: DC

## 2019-08-24 MED ORDER — METHOCARBAMOL 500 MG PO TABS: 500.0000 mg | ORAL_TABLET | Freq: Four times a day (QID) | ORAL | 0 refills | Status: DC | PRN

## 2019-08-24 MED ORDER — EPHEDRINE 5 MG/ML INJ
INTRAVENOUS | Status: AC
  Filled 2019-08-24: qty 10

## 2019-08-24 MED ORDER — HYDROMORPHONE HCL 1 MG/ML IJ SOLN
INTRAMUSCULAR | Status: AC
  Administered 2019-08-24: 0.5 mg via INTRAVENOUS
  Filled 2019-08-24: qty 1

## 2019-08-24 MED ORDER — ONDANSETRON HCL 4 MG/2ML IJ SOLN: 4.0000 mg | Freq: Once | INTRAMUSCULAR | Status: AC

## 2019-08-24 MED ORDER — MIDAZOLAM HCL 2 MG/2ML IJ SOLN
INTRAMUSCULAR | Status: AC
  Filled 2019-08-24: qty 2

## 2019-08-24 MED ORDER — OXYCODONE-ACETAMINOPHEN 7.5-325 MG PO TABS
ORAL_TABLET | ORAL | Status: AC
  Filled 2019-08-24: qty 1

## 2019-08-24 MED ORDER — FENTANYL CITRATE (PF) 250 MCG/5ML IJ SOLN
INTRAMUSCULAR | Status: AC
  Filled 2019-08-24: qty 5

## 2019-08-24 MED ORDER — OXYCODONE HCL 5 MG/5ML PO SOLN: 5.0000 mg | Freq: Once | ORAL | Status: AC | PRN

## 2019-08-24 MED ORDER — PROPOFOL 10 MG/ML IV BOLUS
INTRAVENOUS | Status: DC | PRN
  Administered 2019-08-24: 12 mg via INTRAVENOUS

## 2019-08-24 MED ORDER — TRANEXAMIC ACID-NACL 1000-0.7 MG/100ML-% IV SOLN
1000.0000 mg | INTRAVENOUS | Status: DC
  Filled 2019-08-24 (×2): qty 100

## 2019-08-24 MED ORDER — EPHEDRINE SULFATE-NACL 50-0.9 MG/10ML-% IV SOSY
PREFILLED_SYRINGE | INTRAVENOUS | Status: DC | PRN
  Administered 2019-08-24 (×3): 5 mg via INTRAVENOUS

## 2019-08-24 MED ORDER — ALBUMIN HUMAN 5 % IV SOLN: INTRAVENOUS | Status: DC | PRN

## 2019-08-24 MED ORDER — PROPOFOL 10 MG/ML IV BOLUS
INTRAVENOUS | Status: AC
  Filled 2019-08-24: qty 20

## 2019-08-24 MED ORDER — PROMETHAZINE HCL 25 MG/ML IJ SOLN: 6.2500 mg | INTRAMUSCULAR | Status: DC | PRN

## 2019-08-24 MED ORDER — TERBINAFINE HCL 250 MG PO TABS: 250.0000 mg | ORAL_TABLET | Freq: Every day | ORAL | Status: DC

## 2019-08-24 MED ORDER — PHENYLEPHRINE HCL-NACL 10-0.9 MG/250ML-% IV SOLN
INTRAVENOUS | Status: DC | PRN
  Administered 2019-08-24: 20 ug/min via INTRAVENOUS

## 2019-08-24 MED ORDER — ACETAMINOPHEN 10 MG/ML IV SOLN
INTRAVENOUS | Status: DC | PRN
  Administered 2019-08-24: 1000 mg via INTRAVENOUS

## 2019-08-24 SURGICAL SUPPLY — 56 items
APL SKNCLS STERI-STRIP NONHPOA (GAUZE/BANDAGES/DRESSINGS) ×1
BAG DECANTER FOR FLEXI CONT (MISCELLANEOUS) ×1 IMPLANT
BENZOIN TINCTURE PRP APPL 2/3 (GAUZE/BANDAGES/DRESSINGS) ×2 IMPLANT
BLADE CLIPPER SURG (BLADE) IMPLANT
BLADE SAW SGTL 18X1.27X75 (BLADE) ×2 IMPLANT
CELLS DAT CNTRL 66122 CELL SVR (MISCELLANEOUS) ×1 IMPLANT
COVER SURGICAL LIGHT HANDLE (MISCELLANEOUS) ×2 IMPLANT
COVER WAND RF STERILE (DRAPES) ×2 IMPLANT
CUP PINN GRIPTON 54 100 (Cup) ×1 IMPLANT
DRAPE C-ARM 42X72 X-RAY (DRAPES) ×2 IMPLANT
DRAPE IMP U-DRAPE 54X76 (DRAPES) ×2 IMPLANT
DRAPE STERI IOBAN 125X83 (DRAPES) ×2 IMPLANT
DRAPE U-SHAPE 47X51 STRL (DRAPES) ×6 IMPLANT
DRSG MEPILEX BORDER 4X8 (GAUZE/BANDAGES/DRESSINGS) ×2 IMPLANT
DURAPREP 26ML APPLICATOR (WOUND CARE) ×2 IMPLANT
ELECT BLADE 4.0 EZ CLEAN MEGAD (MISCELLANEOUS)
ELECT CAUTERY BLADE 6.4 (BLADE) ×2 IMPLANT
ELECT REM PT RETURN 9FT ADLT (ELECTROSURGICAL) ×2
ELECTRODE BLDE 4.0 EZ CLN MEGD (MISCELLANEOUS) IMPLANT
ELECTRODE REM PT RTRN 9FT ADLT (ELECTROSURGICAL) ×1 IMPLANT
ELIMINATOR HOLE APEX DEPUY (Hips) ×1 IMPLANT
FACESHIELD WRAPAROUND (MASK) ×4 IMPLANT
FACESHIELD WRAPAROUND OR TEAM (MASK) ×2 IMPLANT
GLOVE BIOGEL PI IND STRL 8 (GLOVE) ×2 IMPLANT
GLOVE BIOGEL PI INDICATOR 8 (GLOVE) ×2
GLOVE ORTHO TXT STRL SZ7.5 (GLOVE) ×4 IMPLANT
GOWN STRL REUS W/ TWL LRG LVL3 (GOWN DISPOSABLE) ×1 IMPLANT
GOWN STRL REUS W/ TWL XL LVL3 (GOWN DISPOSABLE) ×1 IMPLANT
GOWN STRL REUS W/TWL 2XL LVL3 (GOWN DISPOSABLE) ×2 IMPLANT
GOWN STRL REUS W/TWL LRG LVL3 (GOWN DISPOSABLE) ×2
GOWN STRL REUS W/TWL XL LVL3 (GOWN DISPOSABLE) ×2
HEAD CERAMIC 36 PLUS5 (Hips) ×1 IMPLANT
KIT BASIN OR (CUSTOM PROCEDURE TRAY) ×2 IMPLANT
KIT TURNOVER KIT B (KITS) ×2 IMPLANT
LINER NEUTRAL 54X36MM PLUS 4 (Hips) ×1 IMPLANT
MANIFOLD NEPTUNE II (INSTRUMENTS) ×2 IMPLANT
NDL 18GX1X1/2 (RX/OR ONLY) (NEEDLE) IMPLANT
NEEDLE 18GX1X1/2 (RX/OR ONLY) (NEEDLE) ×2 IMPLANT
NS IRRIG 1000ML POUR BTL (IV SOLUTION) ×2 IMPLANT
PACK TOTAL JOINT (CUSTOM PROCEDURE TRAY) ×2 IMPLANT
PAD ARMBOARD 7.5X6 YLW CONV (MISCELLANEOUS) ×4 IMPLANT
RETRACTOR WND ALEXIS 18 MED (MISCELLANEOUS) ×1 IMPLANT
RTRCTR WOUND ALEXIS 18CM MED (MISCELLANEOUS) ×2
STEM FEMORAL SZ 6MM STD ACTIS (Stem) ×1 IMPLANT
STRIP CLOSURE SKIN 1/2X4 (GAUZE/BANDAGES/DRESSINGS) ×2 IMPLANT
SUT VIC AB 0 CT1 27 (SUTURE) ×2
SUT VIC AB 0 CT1 27XBRD ANBCTR (SUTURE) ×1 IMPLANT
SUT VIC AB 2-0 CT1 27 (SUTURE) ×2
SUT VIC AB 2-0 CT1 TAPERPNT 27 (SUTURE) ×1 IMPLANT
SUT VICRYL 4-0 PS2 18IN ABS (SUTURE) ×2 IMPLANT
SUT VLOC 180 0 24IN GS25 (SUTURE) ×2 IMPLANT
TOWEL GREEN STERILE (TOWEL DISPOSABLE) ×4 IMPLANT
TOWEL GREEN STERILE FF (TOWEL DISPOSABLE) ×2 IMPLANT
TRAY CATH 16FR W/PLASTIC CATH (SET/KITS/TRAYS/PACK) IMPLANT
TRAY FOLEY MTR SLVR 16FR STAT (SET/KITS/TRAYS/PACK) IMPLANT
WATER STERILE IRR 1000ML POUR (IV SOLUTION) ×4 IMPLANT

## 2019-08-24 NOTE — Discharge Instructions (Addendum)
INSTRUCTIONS AFTER JOINT REPLACEMENT   o Remove items at home which could result in a fall. This includes throw rugs or furniture in walking pathways o ICE to the affected joint every three hours while awake for 30 minutes at a time, for at least the first 3-5 days, and then as needed for pain and swelling.  Continue to use ice for pain and swelling. You may notice swelling that will progress down to the foot and ankle.  This is normal after surgery.  Elevate your leg when you are not up walking on it.   o Continue to use the breathing machine you got in the hospital (incentive spirometer) which will help keep your temperature down.  It is common for your temperature to cycle up and down following surgery, especially at night when you are not up moving around and exerting yourself.  The breathing machine keeps your lungs expanded and your temperature down.   DIET:  As you were doing prior to hospitalization, we recommend a well-balanced diet.  DRESSING / WOUND CARE / SHOWERING Okay to shower with dressing on , it is waterproof. Leave dressing on until you return to see Dr. Lorin Mercy  No tub soaking. .     ACTIVITY  o Increase activity slowly as tolerated, but follow the weight bearing instructions below.   o No driving for 6 weeks or until further direction given by your physician.  You cannot drive while taking narcotics.  o No lifting or carrying greater than 10 lbs. until further directed by your surgeon. o Avoid periods of inactivity such as sitting longer than an hour when not asleep. This helps prevent blood clots.  o You may return to work once you are authorized by your doctor.     WEIGHT BEARING   Weight bearing as tolerated with assist device (walker, cane, etc) as directed, use it as long as suggested by your surgeon or therapist, typically at least 4-6 weeks.   EXERCISES  Results after joint replacement surgery are often greatly improved when you follow the exercise, range of  motion and muscle strengthening exercises prescribed by your doctor. Safety measures are also important to protect the joint from further injury. Any time any of these exercises cause you to have increased pain or swelling, decrease what you are doing until you are comfortable again and then slowly increase them. If you have problems or questions, call your caregiver or physical therapist for advice.   Rehabilitation is important following a joint replacement. After just a few days of immobilization, the muscles of the leg can become weakened and shrink (atrophy).  These exercises are designed to build up the tone and strength of the thigh and leg muscles and to improve motion. Often times heat used for twenty to thirty minutes before working out will loosen up your tissues and help with improving the range of motion but do not use heat for the first two weeks following surgery (sometimes heat can increase post-operative swelling).   These exercises can be done on a training (exercise) mat, on the floor, on a table or on a bed. Use whatever works the best and is most comfortable for you.    Use music or television while you are exercising so that the exercises are a pleasant break in your day. This will make your life better with the exercises acting as a break in your routine that you can look forward to.   Perform all exercises about fifteen times, three times  per day or as directed.  You should exercise both the operative leg and the other leg as well.  Exercises include:   . Quad Sets - Tighten up the muscle on the front of the thigh (Quad) and hold for 5-10 seconds.   . Straight Leg Raises - With your knee straight (if you were given a brace, keep it on), lift the leg to 60 degrees, hold for 3 seconds, and slowly lower the leg.  Perform this exercise against resistance later as your leg gets stronger.  . Leg Slides: Lying on your back, slowly slide your foot toward your buttocks, bending your knee up  off the floor (only go as far as is comfortable). Then slowly slide your foot back down until your leg is flat on the floor again.  Glenard Haring Wings: Lying on your back spread your legs to the side as far apart as you can without causing discomfort.  . Hamstring Strength:  Lying on your back, push your heel against the floor with your leg straight by tightening up the muscles of your buttocks.  Repeat, but this time bend your knee to a comfortable angle, and push your heel against the floor.  You may put a pillow under the heel to make it more comfortable if necessary.   A rehabilitation program following joint replacement surgery can speed recovery and prevent re-injury in the future due to weakened muscles. Contact your doctor or a physical therapist for more information on knee rehabilitation.    CONSTIPATION  Constipation is defined medically as fewer than three stools per week and severe constipation as less than one stool per week.  Even if you have a regular bowel pattern at home, your normal regimen is likely to be disrupted due to multiple reasons following surgery.  Combination of anesthesia, postoperative narcotics, change in appetite and fluid intake all can affect your bowels.   YOU MUST use at least one of the following options; they are listed in order of increasing strength to get the job done.  They are all available over the counter, and you may need to use some, POSSIBLY even all of these options:    Drink plenty of fluids (prune juice may be helpful) and high fiber foods Colace 100 mg by mouth twice a day  Senokot for constipation as directed and as needed Dulcolax (bisacodyl), take with full glass of water  Miralax (polyethylene glycol) once or twice a day as needed.  If you have tried all these things and are unable to have a bowel movement in the first 3-4 days after surgery call either your surgeon or your primary doctor.    If you experience loose stools or diarrhea, hold  the medications until you stool forms back up.  If your symptoms do not get better within 1 week or if they get worse, check with your doctor.  If you experience "the worst abdominal pain ever" or develop nausea or vomiting, please contact the office immediately for further recommendations for treatment.   ITCHING:  If you experience itching with your medications, try taking only a single pain pill, or even half a pain pill at a time.  You can also use Benadryl over the counter for itching or also to help with sleep.   TED HOSE STOCKINGS:  Use stockings on both legs until for at least 2 weeks or as directed by physician office. They may be removed at night for sleeping.  MEDICATIONS:  See your medication summary  on the "After Visit Summary" that nursing will review with you.  You may have some home medications which will be placed on hold until you complete the course of blood thinner medication.  It is important for you to complete the blood thinner medication as prescribed.  PRECAUTIONS:  If you experience chest pain or shortness of breath - call 911 immediately for transfer to the hospital emergency department.   If you develop a fever greater that 101 F, purulent drainage from wound, increased redness or drainage from wound, foul odor from the wound/dressing, or calf pain - CONTACT YOUR SURGEON.                                                   FOLLOW-UP APPOINTMENTS:  If you do not already have a post-op appointment, please call the office for an appointment to be seen by your surgeon.  Guidelines for how soon to be seen are listed in your "After Visit Summary", but are typically between 1-4 weeks after surgery.  OTHER INSTRUCTIONS:   Knee Replacement:  Do not place pillow under knee, focus on keeping the knee straight while resting. CPM instructions: 0-90 degrees, 2 hours in the morning, 2 hours in the afternoon, and 2 hours in the evening. Place foam block, curve side up under heel at all  times except when in CPM or when walking.  DO NOT modify, tear, cut, or change the foam block in any way.  MAKE SURE YOU:  . Understand these instructions.  . Get help right away if you are not doing well or get worse.    Thank you for letting us be a part of your medical care team.  It is a privilege we respect greatly.  We hope these instructions will help you stay on track for a fast and full recovery!

## 2019-08-24 NOTE — Evaluation (Addendum)
Physical Therapy Evaluation Patient Details Name: Victor Price MRN: [id number] DOB: 03-24-1951 Today's Date: 08/24/2019   History of Present Illness  Pt is a 69 y.o. M with no significant PMH who presents s/p left total hip arthroplasty, direct anterior approach.   Clinical Impression  Pt admitted s/p procedure listed above. Presents with expected post surgical pain and gait abnormalities as listed below. Ambulating 100 feet with a walker at a supervision level. Recommended use of walker for all mobility. Education provided regarding: stair negotiation, exercise recommendations, activity progression, cryotherapy. Written HEP provided to initiate range of motion and strengthening. Pt will benefit from OPPT follow up to address deficits and maximize functional independence.     Follow Up Recommendations Outpatient PT    Equipment Recommendations  None recommended by PT    Recommendations for Other Services       Precautions / Restrictions Precautions Precautions: Fall Restrictions Weight Bearing Restrictions: No      Mobility  Bed Mobility               General bed mobility comments: In chair  Transfers Overall transfer level: Modified independent Equipment used: Rolling walker (2 wheeled)                Ambulation/Gait Ambulation/Gait assistance: Supervision Gait Distance (Feet): 100 Feet Assistive device: Rolling walker (2 wheeled) Gait Pattern/deviations: Step-through pattern;Decreased step length - left;Antalgic;Decreased dorsiflexion - left;Decreased stance time - left Gait velocity: decreased   General Gait Details: Cues for walker proximity, equal step lengths, sequencing  Stairs            Wheelchair Mobility    Modified Rankin (Stroke Patients Only)       Balance Overall balance assessment: Needs assistance Sitting-balance support: Feet supported Sitting balance-Leahy Scale: Normal     Standing balance support: Bilateral upper  extremity supported Standing balance-Leahy Scale: Fair                               Pertinent Vitals/Pain Pain Assessment: Faces Faces Pain Scale: Hurts little more Pain Location: left hip Pain Descriptors / Indicators: Operative site guarding;Grimacing Pain Intervention(s): Monitored during session    Home Living Family/patient expects to be discharged to:: Private residence Living Arrangements: Spouse/significant other Available Help at Discharge: Family;Available 24 hours/day Type of Home: House Home Access: Stairs to enter   [employer] of Steps: 2   Home Equipment: [place] - 2 wheels;Shower seat      Prior Function Level of Independence: Independent               Hand Dominance        Extremity/Trunk Assessment   Upper Extremity Assessment Upper Extremity Assessment: Overall WFL for tasks assessed    Lower Extremity Assessment Lower Extremity Assessment: LLE deficits/detail LLE Deficits / Details: s/p THA; at least anti gravity strength    Cervical / Trunk Assessment Cervical / Trunk Assessment: Normal  Communication   Communication: No difficulties  Cognition Arousal/Alertness: Awake/alert Behavior During Therapy: WFL for tasks assessed/performed Overall Cognitive Status: Within Functional Limits for tasks assessed                                        General Comments      Exercises     Assessment/Plan    PT Assessment Patient needs continued PT services  PT Problem List Decreased strength;Decreased balance;Decreased mobility;Pain       PT Treatment Interventions DME instruction;Gait training;Stair training;Functional mobility training;Therapeutic activities;Therapeutic exercise;Balance training;Patient/family education    PT Goals (Current goals can be found in the Care Plan section)  Acute Rehab PT Goals Patient Stated Goal: "get home." PT Goal Formulation: With patient Time For Goal  Achievement: 09/07/19 Potential to Achieve Goals: Good    Frequency 7X/week   Barriers to discharge        Co-evaluation               AM-PAC PT "6 Clicks" Mobility  Outcome Measure Help needed turning from your back to your side while in a flat bed without using bedrails?: None Help needed moving from lying on your back to sitting on the side of a flat bed without using bedrails?: None Help needed moving to and from a bed to a chair (including a wheelchair)?: None Help needed standing up from a chair using your arms (e.g., wheelchair or bedside chair)?: None Help needed to walk in hospital room?: A Little Help needed climbing 3-5 steps with a railing? : A Little 6 Click Score: 22    End of Session   Activity Tolerance: Patient tolerated treatment well Patient left: in chair;with call bell/phone within reach Nurse Communication: Mobility status PT Visit Diagnosis: Pain;Difficulty in walking, not elsewhere classified (R26.2) Pain - Right/Left: Left Pain - part of body: Hip    Time: 1414-1440 PT Time Calculation (min) (ACUTE ONLY): 26 min   Charges:   PT Evaluation $PT Eval Low Complexity: 1 Low PT Treatments $Gait Training: 8-22 mins        Ellamae Sia, PT, DPT Acute Rehabilitation Services Pager [phone] Office [phone]   Willy Eddy 08/24/2019, 3:09 PM

## 2019-08-24 NOTE — Anesthesia Postprocedure Evaluation (Signed)
Anesthesia Post Note  Patient: Victor Price  Procedure(s) Performed: LEFT TOTAL HIP ARTHROPLASTY-DIRECT ANTERIOR (Left Hip)     Patient location during evaluation: PACU Anesthesia Type: Spinal Level of consciousness: awake and alert Pain management: pain level controlled Vital Signs Assessment: post-procedure vital signs reviewed and stable Respiratory status: spontaneous breathing, nonlabored ventilation and respiratory function stable Cardiovascular status: blood pressure returned to baseline and stable Postop Assessment: no apparent nausea or vomiting Anesthetic complications: no    Last Vitals:  Vitals:   08/24/19 0955 08/24/19 1010  BP: 123/75 123/69  Pulse: 69 64  Resp: 20 14  Temp:    SpO2: 100% 100%    Last Pain:  Vitals:   08/24/19 1010  TempSrc:   PainSc: 0-No pain                 Lynda Rainwater

## 2019-08-24 NOTE — Anesthesia Procedure Notes (Signed)
Procedure Name: MAC Date/Time: 08/24/2019 7:35 AM Performed by: Renato Shin, CRNA Pre-anesthesia Checklist: Patient identified, Emergency Drugs available, Suction available and Patient being monitored Patient Re-evaluated:Patient Re-evaluated prior to induction Oxygen Delivery Method: Nasal cannula Preoxygenation: Pre-oxygenation with 100% oxygen Induction Type: IV induction Placement Confirmation: positive ETCO2 and breath sounds checked- equal and bilateral Dental Injury: Teeth and Oropharynx as per pre-operative assessment

## 2019-08-24 NOTE — Interval H&P Note (Signed)
History and Physical Interval Note:  08/24/2019 7:19 AM  Victor Price  has presented today for surgery, with the diagnosis of right hip osteoarthritis.  The various methods of treatment have been discussed with the patient and family. After consideration of risks, benefits and other options for treatment, the patient has consented to  Procedure(s): LEFT TOTAL HIP ARTHROPLASTY-DIRECT ANTERIOR (Left) as a surgical intervention.  The patient's history has been reviewed, patient examined, no change in status, stable for surgery.  I have reviewed the patient's chart and labs.  Questions were answered to the patient's satisfaction.     Marybelle Killings

## 2019-08-24 NOTE — Anesthesia Procedure Notes (Signed)
Spinal  Patient location during procedure: OR Start time: 08/24/2019 7:30 AM End time: 08/24/2019 7:35 AM Staffing Performed: anesthesiologist  Anesthesiologist: Lynda Rainwater, MD Preanesthetic Checklist Completed: patient identified, IV checked, site marked, risks and benefits discussed, surgical consent, monitors and equipment checked, pre-op evaluation and timeout performed Spinal Block Patient position: sitting Prep: DuraPrep Patient monitoring: heart rate, cardiac monitor, continuous pulse ox and blood pressure Approach: midline Location: L3-4 Injection technique: single-shot Needle Needle type: Sprotte  Needle gauge: 24 G Needle length: 9 cm Assessment Sensory level: T4

## 2019-08-24 NOTE — Op Note (Signed)
Preop diagnosis: Left hip primary osteoarthritis  Postop diagnosis: Same  Procedure: Left total hip arthroplasty, direct anterior approach  Surgeon: Rodell Perna, MD  Assistant: Benjiman Core, PA-C medically necessary and present for the entire procedure  Anesthesia spinal plus Exparel Marcaine.  EBL: 150 cc  Implants:Depuy gripton54 cup ,plus 4 neutral liner 54 x 36 mm.  Apex hole eliminator.  Size 6 Actis femoral stem.  +5 mm ceramic ball.  Procedure: After induction of spinal anesthesia Hana boot placement C-arm was brought in for visualization after 1015 drapes were applied prepping with DuraPrep the usual total hip sheets drapes large shower curtain Betadine Steri-Drape application and hydraulic arm roll applied timeout procedure completed Ancef prophylaxis, TXA topical sponge was used during the case.  Incision was made 1 cm lateral one inferior to the ASIS obliquely to the trochanter.  Fascia was divided elevated and patient was then and incision was short enough he did not require the skin protector.  Progression down the capsule opening capsule there was clear fluid no culture was obtained neck was cut under C-arm visualization head was removed with a corkscrew with some difficulty due to the severe erosive changes flattening of the head marginal osteophytes.  Spurs were trimmed off of the acetabulum.  85 degree Hohmann was placed anterior to the hip capsule and sequential reaming progressing up to a 53 for 54 gripton cup.  54 cup was inserted under C arm impacted.  At the midportion of the cup there still appeared to be a tiny bit of space but the walls of the cup were extremely tight and the cup would not advance any further.  Cup was secure that and the entire pelvis could be rocked.  Apex hole illuminator was inserted.  +4 neutral liner with no lip poly inserted.  Hydraulic arm was applied leg was taken down and underneath the opposite leg after externally rotating the 120 degrees.   Posterior capsule was split external rotators were left on.  Mueller retractor medial and large trochanteric retractor behind the greater trochanter.  Progression up on the femoral side up to a #6 size.  There is good fit good fill and tight stem with a #6.  Trials were used +5 restored neck length.  Due to patient's age of 85 the ceramic ball was selected patient is a high activity patient and good physical shape and opposite hip is normal.  Diona Foley was impacted hip reduced excellent findings of stability no shock external rotation 90 degrees no subluxation final pictures were taken.  There was no bleeding TXA sponge was used and the lock closure in the fascia 2 and subtenons tissue skin staple closure postop dressing and transferred to cover him.

## 2019-08-24 NOTE — Transfer of Care (Signed)
Immediate Anesthesia Transfer of Care Note  Patient: Victor Price  Procedure(s) Performed: LEFT TOTAL HIP ARTHROPLASTY-DIRECT ANTERIOR (Left Hip)  Patient Location: PACU  Anesthesia Type:MAC and Spinal  Level of Consciousness: awake, alert  and patient cooperative  Airway & Oxygen Therapy: Patient Spontanous Breathing and Patient connected to nasal cannula oxygen  Post-op Assessment: Report given to RN and Post -op Vital signs reviewed and stable  Post vital signs: Reviewed and stable  Last Vitals:  Vitals Value Taken Time  BP 105/63 08/24/19 0925  Temp    Pulse 70 08/24/19 0926  Resp 17 08/24/19 0926  SpO2 99 % 08/24/19 0926  Vitals shown include unvalidated device data.  Last Pain:  Vitals:   08/24/19 0715  TempSrc:   PainSc: 0-No pain         Complications: No apparent anesthesia complications

## 2019-08-24 NOTE — H&P (Signed)
TOTAL HIP ADMISSION H&P  Patient is admitted for left total hip arthroplasty.  Subjective:  Chief Complaint: left hip pain  HPI: Victor Price, 69 y.o. male, has a history of pain and functional disability in the left hip(s) due to arthritis and patient has failed non-surgical conservative treatments for greater than 12 weeks to include NSAID's and/or analgesics, use of assistive devices and activity modification.  Onset of symptoms was gradual starting 10 years ago with gradually worsening course since that time.The patient noted no past surgery on the left hip(s).  Patient currently rates pain in the left hip at 10 out of 10 with activity. Patient has night pain, worsening of pain with activity and weight bearing, trendelenberg gait, pain that interfers with activities of daily living and pain with passive range of motion. Patient has evidence of subchondral sclerosis, periarticular osteophytes and joint space narrowing by imaging studies. This condition presents safety issues increasing the risk of falls.   There is no current active infection.  Patient Active Problem List   Diagnosis Date Noted  . Unilateral primary osteoarthritis, left hip 07/08/2019  . DDD (degenerative disc disease), lumbar 06/05/2019  . Subclinical hyperthyroidism 10/31/2017  . Hypogonadism in male 10/31/2017  . CKD (chronic kidney disease) stage 3, GFR 30-59 ml/min 02/08/2017  . Bronchiectasis ([place]) 07/20/2016  . Radiculitis of right cervical region 08/28/2014  . DDD (degenerative disc disease), cervical 08/28/2014  . Insomnia 04/30/2014  . Iliotibial band syndrome of left side 04/05/2013  . Peripheral neuropathy 04/02/2013  . Herniated disc 01/29/2013  . BPH (benign prostatic hyperplasia) 12/14/2011  . ERECTILE DYSFUNCTION, ORGANIC 07/23/2010  . THYROID NODULE, LEFT 05/23/2007  . Hyperlipidemia 05/17/2006  . HYPERTENSION, BENIGN SYSTEMIC 05/17/2006   Past Medical History:  Diagnosis Date  . BPH (benign  prostatic hyperplasia)   . Bronchiectasis with acute lower respiratory infection ([place]) 08/13/2019  . Bronchitis    inhaler prn  . ED (erectile dysfunction)   . GERD (gastroesophageal reflux disease)   . HLD (hyperlipidemia)   . Hypertension   . Pneumonia    gets recurrent PNA in left lung, recent dx 08/13/19 left lower lobe  . Seasonal allergies     Past Surgical History:  Procedure Laterality Date  . BACK SURGERY  1980s   [facility]  . COLONOSCOPY  2004, 2012   several - polyps  . MULTIPLE TOOTH EXTRACTIONS      Current Facility-Administered Medications  Medication Dose Route Frequency Provider Last Rate Last Admin  . bupivacaine liposome (EXPAREL) 1.3 % injection 133 mg  10 mL Infiltration To OR Marybelle Killings, MD      . ceFAZolin (ANCEF) IVPB 2g/100 mL premix  2 g Intravenous On Call to OR Lanae Crumbly, PA-C      . chlorhexidine (HIBICLENS) 4 % liquid 4 application  60 mL Topical Once Benjiman Core M, PA-C      . tranexamic acid (CYKLOKAPRON) 2,000 mg in sodium chloride 0.9 % 50 mL Topical Application  123XX123 mg Topical Once Marybelle Killings, MD      . tranexamic acid (CYKLOKAPRON) IVPB 1,000 mg  1,000 mg Intravenous To OR Lanae Crumbly, PA-C       No Known Allergies  Social History   Tobacco Use  . Smoking status: Never Smoker  . Smokeless tobacco: Never Used  Substance Use Topics  . Alcohol use: No    History reviewed. No pertinent family history.   Review of Systems  Constitutional: Positive for activity change.  HENT: Negative.   Respiratory: Positive for cough and wheezing.   Gastrointestinal: Negative.   Genitourinary: Negative.   Musculoskeletal: Positive for gait problem.  Psychiatric/Behavioral: Negative.     Objective:  Physical Exam  Constitutional: He is oriented to person, place, and time. He appears well-developed. No distress.  Eyes: Pupils are equal, round, and reactive to light.  Cardiovascular: Normal heart sounds.  Respiratory: He has wheezes.  GI:  Soft. He exhibits no distension. There is no abdominal tenderness.  Musculoskeletal:        General: Tenderness present.     Cervical back: Normal range of motion.  Neurological: He is alert and oriented to person, place, and time.  Skin: Skin is warm and dry.  Psychiatric: He has a normal mood and affect.    Vital signs in last 24 hours: Temp:  [98.1 F (36.7 C)] 98.1 F (36.7 C) (01/15 0606) Pulse Rate:  [78] 78 (01/15 0606) BP: (128)/(76) 128/76 (01/15 0606) SpO2:  [99 %] 99 % (01/15 0606) Weight:  [74.8 kg] 74.8 kg (01/15 0606)  Labs:   Estimated body mass index is 22.38 kg/m as calculated from the following:   Height as of this encounter: 6' (1.829 m).   Weight as of this encounter: 74.8 kg.   Imaging Review Plain radiographs demonstrate moderate degenerative joint disease of the left hip(s). The bone quality appears to be good for age and reported activity level.      Assessment/Plan:  End stage arthritis, left hip(s)  The patient history, physical examination, clinical judgement of the provider and imaging studies are consistent with end stage degenerative joint disease of the left hip(s) and total hip arthroplasty is deemed medically necessary. The treatment options including medical management, injection therapy, arthroscopy and arthroplasty were discussed at length. The risks and benefits of total hip arthroplasty were presented and reviewed. The risks due to aseptic loosening, infection, stiffness, dislocation/subluxation,  thromboembolic complications and other imponderables were discussed.  The patient acknowledged the explanation, agreed to proceed with the plan and consent was signed. Patient is being admitted for inpatient treatment for surgery, pain control, PT, OT, prophylactic antibiotics, VTE prophylaxis, progressive ambulation and ADL's and discharge planning.The patient is planning to be discharged home with home health services

## 2019-08-27 ENCOUNTER — Encounter: Payer: Self-pay | Admitting: *Deleted

## 2019-09-04 ENCOUNTER — Other Ambulatory Visit: Payer: Self-pay | Admitting: Family Medicine

## 2019-09-07 ENCOUNTER — Telehealth: Payer: Self-pay | Admitting: Family Medicine

## 2019-09-07 ENCOUNTER — Ambulatory Visit (INDEPENDENT_AMBULATORY_CARE_PROVIDER_SITE_OTHER): Payer: Medicare HMO | Admitting: Orthopaedic Surgery

## 2019-09-07 ENCOUNTER — Other Ambulatory Visit: Payer: Self-pay

## 2019-09-07 ENCOUNTER — Ambulatory Visit (INDEPENDENT_AMBULATORY_CARE_PROVIDER_SITE_OTHER): Payer: Medicare HMO

## 2019-09-07 VITALS — BP 131/79 | HR 87 | Ht 72.0 in | Wt 165.0 lb

## 2019-09-07 DIAGNOSIS — Z96642 Presence of left artificial hip joint: Secondary | ICD-10-CM

## 2019-09-07 NOTE — Progress Notes (Signed)
   Post-Op Visit Note   Patient: Victor Price           Date of Birth: 13-Mar-1951           MRN: [id number] Visit Date: 09/07/2019 PCP: Hali Marry, MD   Assessment & Plan: Staples removed left hip.  He has some numbness anterolateral thigh which we discussed.  He is off his pain medicine after few days.  Voiding problems taking Flomax.  He can wean to a cane.  Recheck 6 weeks for likely final visit.  Chief Complaint:  Chief Complaint  Patient presents with  . Left Hip - Routine Post Op   Visit Diagnoses:  1. Status post left hip replacement     Plan: Continue ambulation gradually increase distance ambulation and strength and stamina. Follow-Up Instructions: No follow-ups on file.   Orders:  Orders Placed This Encounter  Procedures  . XR HIP UNILAT W OR W/O PELVIS 1V LEFT   No orders of the defined types were placed in this encounter.   Imaging: No results found.  PMFS History: Patient Active Problem List   Diagnosis Date Noted  . Unilateral primary osteoarthritis, left hip 07/08/2019  . DDD (degenerative disc disease), lumbar 06/05/2019  . Subclinical hyperthyroidism 10/31/2017  . Hypogonadism in male 10/31/2017  . CKD (chronic kidney disease) stage 3, GFR 30-59 ml/min 02/08/2017  . Bronchiectasis ([place]) 07/20/2016  . Radiculitis of right cervical region 08/28/2014  . DDD (degenerative disc disease), cervical 08/28/2014  . Insomnia 04/30/2014  . Iliotibial band syndrome of left side 04/05/2013  . Peripheral neuropathy 04/02/2013  . Herniated disc 01/29/2013  . BPH (benign prostatic hyperplasia) 12/14/2011  . ERECTILE DYSFUNCTION, ORGANIC 07/23/2010  . THYROID NODULE, LEFT 05/23/2007  . Hyperlipidemia 05/17/2006  . HYPERTENSION, BENIGN SYSTEMIC 05/17/2006   Past Medical History:  Diagnosis Date  . BPH (benign prostatic hyperplasia)   . Bronchiectasis with acute lower respiratory infection ([place]) 08/13/2019  . Bronchitis    inhaler prn  . ED  (erectile dysfunction)   . GERD (gastroesophageal reflux disease)   . HLD (hyperlipidemia)   . Hypertension   . Pneumonia    gets recurrent PNA in left lung, recent dx 08/13/19 left lower lobe  . Seasonal allergies     No family history on file.  Past Surgical History:  Procedure Laterality Date  . BACK SURGERY  1980s   [facility]  . COLONOSCOPY  2004, 2012   several - polyps  . MULTIPLE TOOTH EXTRACTIONS    . TOTAL HIP ARTHROPLASTY Left 08/24/2019   Procedure: LEFT TOTAL HIP ARTHROPLASTY-DIRECT ANTERIOR;  Surgeon: Marybelle Killings, MD;  Location: [facility];  Service: Orthopedics;  Laterality: Left;   Social History   Occupational History  . Occupation: [employer]    Employer: [employer]  Tobacco Use  . Smoking status: Never Smoker  . Smokeless tobacco: Never Used  Substance and Sexual Activity  . Alcohol use: No  . Drug use: No  . Sexual activity: Yes

## 2019-09-07 NOTE — Telephone Encounter (Signed)
Received a fax from insurance that Testosterone Gel was approved from 08/10/2019 through 08/08/2020. Pharmacy aware and form sent to scan.

## 2019-09-15 ENCOUNTER — Other Ambulatory Visit: Payer: Self-pay | Admitting: Surgery

## 2019-09-17 NOTE — Telephone Encounter (Signed)
Please advise 

## 2019-09-17 NOTE — Telephone Encounter (Signed)
He takes it for 4 wks from surgery then stops.  ucall

## 2019-09-17 NOTE — Telephone Encounter (Signed)
I called patient and advised. 

## 2019-09-17 NOTE — Telephone Encounter (Signed)
[place] patient

## 2019-10-09 ENCOUNTER — Encounter: Payer: Self-pay | Admitting: Family Medicine

## 2019-10-09 ENCOUNTER — Telehealth (INDEPENDENT_AMBULATORY_CARE_PROVIDER_SITE_OTHER): Payer: Medicare HMO | Admitting: Family Medicine

## 2019-10-09 VITALS — BP 134/79 | HR 81 | Ht 72.0 in | Wt 165.0 lb

## 2019-10-09 DIAGNOSIS — R053 Chronic cough: Secondary | ICD-10-CM

## 2019-10-09 DIAGNOSIS — N1831 Chronic kidney disease, stage 3a: Secondary | ICD-10-CM | POA: Diagnosis not present

## 2019-10-09 DIAGNOSIS — E291 Testicular hypofunction: Secondary | ICD-10-CM | POA: Diagnosis not present

## 2019-10-09 DIAGNOSIS — I10 Essential (primary) hypertension: Secondary | ICD-10-CM

## 2019-10-09 DIAGNOSIS — E0590 Thyrotoxicosis, unspecified without thyrotoxic crisis or storm: Secondary | ICD-10-CM

## 2019-10-09 DIAGNOSIS — R05 Cough: Secondary | ICD-10-CM | POA: Diagnosis not present

## 2019-10-09 DIAGNOSIS — I129 Hypertensive chronic kidney disease with stage 1 through stage 4 chronic kidney disease, or unspecified chronic kidney disease: Secondary | ICD-10-CM | POA: Diagnosis not present

## 2019-10-09 MED ORDER — BENZONATATE 200 MG PO CAPS: 200.0000 mg | ORAL_CAPSULE | Freq: Every evening | ORAL | 2 refills | Status: DC | PRN

## 2019-10-09 NOTE — Assessment & Plan Note (Signed)
Home BPs have been well controlled.

## 2019-10-09 NOTE — Assessment & Plan Note (Signed)
Recent labs shows stable creatinine.

## 2019-10-09 NOTE — Assessment & Plan Note (Signed)
Will recheck TSH yearly.

## 2019-10-09 NOTE — Assessment & Plan Note (Signed)
Due for labs after increased to 3 pumps.  He is doing well on the regimen.

## 2019-10-09 NOTE — Progress Notes (Signed)
Established Patient Office Visit  Subjective:  Patient ID: Victor Price, male    DOB: 1950-10-28  Age: 69 y.o. MRN: [id number]  CC:  Chief Complaint  Patient presents with  . Hypothyroidism  . Hypertension  . Hypogonadism    HPI Victor Price presents for   Hypertension- Pt denies chest pain, SOB, dizziness, or heart palpitations.  Taking meds as directed w/o problems.  Denies medication side effects.    Follow-up hypogonadism-currently on testosterone replacement therapy.  We had actually increase testosterone to 3 pumps daily in October.  He has not gone for follow-up lab work yet. He says the increase has been good and he is doing well.    Underwent left total hip replacement in January.F/U with Dr Lorin Mercy on 3/12. He is doing really well.    Follow-up CKD 3-recent renal function before surgery showed a serum creatinine of 1.4 which is his baseline.  Continue to follow every 6 months.  Has a chronic cough - says can be really bothersome at night.  Would like something. Runs humidifier.  No acute illness. No fever, etc.   Past Medical History:  Diagnosis Date  . BPH (benign prostatic hyperplasia)   . Bronchiectasis with acute lower respiratory infection ([place]) 08/13/2019  . Bronchitis    inhaler prn  . ED (erectile dysfunction)   . GERD (gastroesophageal reflux disease)   . HLD (hyperlipidemia)   . Hypertension   . Pneumonia    gets recurrent PNA in left lung, recent dx 08/13/19 left lower lobe  . Seasonal allergies     Past Surgical History:  Procedure Laterality Date  . BACK SURGERY  1980s   [facility]  . COLONOSCOPY  2004, 2012   several - polyps  . MULTIPLE TOOTH EXTRACTIONS    . TOTAL HIP ARTHROPLASTY Left 08/24/2019   Procedure: LEFT TOTAL HIP ARTHROPLASTY-DIRECT ANTERIOR;  Surgeon: Marybelle Killings, MD;  Location: [facility];  Service: Orthopedics;  Laterality: Left;    History reviewed. No pertinent family history.  Social History   Socioeconomic History  . Marital  status: Single    Spouse name: Not on file  . Number of children: Not on file  . Years of education: Not on file  . Highest education level: Not on file  Occupational History  . Occupation: [employer]    Employer: [employer]  Tobacco Use  . Smoking status: Never Smoker  . Smokeless tobacco: Never Used  Substance and Sexual Activity  . Alcohol use: No  . Drug use: No  . Sexual activity: Yes  Other Topics Concern  . Not on file  Social History Narrative  . Not on file   Social Determinants of Health   Financial Resource Strain:   . Difficulty of Paying Living Expenses: Not on file  Food Insecurity:   . Worried About Charity fundraiser in the Last Year: Not on file  . Ran Out of Food in the Last Year: Not on file  Transportation Needs:   . Lack of Transportation (Medical): Not on file  . Lack of Transportation (Non-Medical): Not on file  Physical Activity:   . Days of Exercise per Week: Not on file  . Minutes of Exercise per Session: Not on file  Stress:   . Feeling of Stress : Not on file  Social Connections:   . Frequency of Communication with Friends and Family: Not on file  . Frequency of Social Gatherings with Friends and Family: Not on file  .  Attends Religious Services: Not on file  . Active Member of Clubs or Organizations: Not on file  . Attends Archivist Meetings: Not on file  . Marital Status: Not on file  Intimate Partner Violence:   . Fear of Current or Ex-Partner: Not on file  . Emotionally Abused: Not on file  . Physically Abused: Not on file  . Sexually Abused: Not on file    Outpatient Medications Prior to Visit  Medication Sig Dispense Refill  . albuterol (VENTOLIN HFA) 108 (90 Base) MCG/ACT inhaler Inhale 2 puffs into the lungs every 6 (six) hours as needed for wheezing or shortness of breath. 18 g prn  . atorvastatin (LIPITOR) 40 MG tablet Take 1 tablet (40 mg total) by mouth at bedtime. 90 tablet 3  . bisoprolol-hydrochlorothiazide  (ZIAC) 10-6.25 MG tablet TAKE 1 TABLET BY MOUTH EVERY DAY (Patient taking differently: Take 1 tablet by mouth daily. ) 90 tablet 0  . [employer] ALLERGY RELIEF-D 10-240 MG 24 hr tablet TAKE 1 TABLET BY MOUTH EVERY DAY 30 tablet 11  . lansoprazole (PREVACID) 30 MG capsule Take 1 capsule (30 mg total) by mouth daily. 90 capsule 3  . sildenafil (REVATIO) 20 MG tablet Take 2-5 tablets (40-100 mg total) by mouth daily as needed. 40 tablet 5  . tamsulosin (FLOMAX) 0.4 MG CAPS capsule TAKE 1 CAPSULE (0.4 MG TOTAL) BY MOUTH DAILY AFTER SUPPER. 90 capsule 1  . terbinafine (LAMISIL) 250 MG tablet Take 1 tablet (250 mg total) by mouth daily. 90 tablet 1  . Testosterone 10 MG/ACT (2%) GEL APPLY 1 PUMP TO AN INNER THIGH AND 2 PUMPS TO OPPOSITE INNER THIGH IN AM. FORTESTA. 120 g 2  . traZODone (DESYREL) 50 MG tablet TAKE 0.5-2 TABLETS (25-100 MG TOTAL) BY MOUTH AT BEDTIME AS NEEDED FOR SLEEP. (Patient taking differently: Take 25-100 mg by mouth at bedtime as needed for sleep. ) 180 tablet 3  . aspirin EC 325 MG tablet Take 1 tablet (325 mg total) by mouth daily. 30 tablet 0  . doxycycline (VIBRA-TABS) 100 MG tablet Take 1 tablet (100 mg total) by mouth 2 (two) times daily. 10 tablet 0  . methocarbamol (ROBAXIN) 500 MG tablet Take 1 tablet (500 mg total) by mouth every 6 (six) hours as needed for muscle spasms. 60 tablet 0  . oxyCODONE-acetaminophen (PERCOCET) 7.5-325 MG tablet Take 1 tablet by mouth every 6 (six) hours as needed for severe pain. 60 tablet 0   No facility-administered medications prior to visit.    No Known Allergies  ROS Review of Systems    Objective:    Physical Exam  Constitutional: He is oriented to person, place, and time. He appears well-developed and well-nourished.  HENT:  Head: Normocephalic and atraumatic.  Eyes: Conjunctivae and EOM are normal.  Pulmonary/Chest: Effort normal.  Neurological: He is alert and oriented to person, place, and time.  Skin: Skin is dry. No pallor.   Psychiatric: He has a normal mood and affect. His behavior is normal.  Vitals reviewed.   BP 134/79   Pulse 81   Ht 6' (1.829 m)   Wt 165 lb (74.8 kg)   BMI 22.38 kg/m  Wt Readings from Last 3 Encounters:  10/09/19 165 lb (74.8 kg)  09/07/19 165 lb (74.8 kg)  08/24/19 165 lb (74.8 kg)     There are no preventive care reminders to display for this patient.  There are no preventive care reminders to display for this patient.  Lab Results  Component Value Date   TSH 0.11 (L) 10/20/2017   Lab Results  Component Value Date   WBC 11.5 (H) 08/15/2019   HGB 14.2 08/15/2019   HCT 43.7 08/15/2019   MCV 94.6 08/15/2019   PLT 419 (H) 08/15/2019   Lab Results  Component Value Date   NA 137 08/15/2019   K 4.5 08/15/2019   CO2 26 08/15/2019   GLUCOSE 119 (H) 08/15/2019   BUN 20 08/15/2019   CREATININE 1.46 (H) 08/15/2019   BILITOT 0.6 08/15/2019   ALKPHOS 75 08/15/2019   AST 18 08/15/2019   ALT 23 08/15/2019   PROT 7.9 08/15/2019   ALBUMIN 3.8 08/15/2019   CALCIUM 9.5 08/15/2019   ANIONGAP 8 08/15/2019   Lab Results  Component Value Date   CHOL 222 (H) 06/27/2018   Lab Results  Component Value Date   HDL 72 06/27/2018   Lab Results  Component Value Date   LDLCALC 120 (H) 06/27/2018   Lab Results  Component Value Date   TRIG 189 (H) 06/27/2018   Lab Results  Component Value Date   CHOLHDL 3.1 06/27/2018   No results found for: HGBA1C    Assessment & Plan:   Problem List Items Addressed This Visit      Cardiovascular and Mediastinum   HYPERTENSION, BENIGN SYSTEMIC - Primary    Home BPs have been well controlled.         Endocrine   Subclinical hyperthyroidism    Will recheck TSH yearly.       Hypogonadism in male    Due for labs after increased to 3 pumps.  He is doing well on the regimen.          Genitourinary   CKD (chronic kidney disease) stage 3, GFR 30-59 ml/min    Recent labs shows stable creatinine.         Other Visit  Diagnoses    Chronic cough         Chronic cough  - he wants something for bedtime. Trial of tessalon perles.   Meds ordered this encounter  Medications  . benzonatate (TESSALON) 200 MG capsule    Sig: Take 1 capsule (200 mg total) by mouth at bedtime as needed for cough.    Dispense:  30 capsule    Refill:  2    Follow-up: Return in about 6 months (around 04/10/2020) for Hypertension and testosterone. Beatrice Lecher, MD

## 2019-10-10 ENCOUNTER — Telehealth: Payer: Self-pay

## 2019-10-10 DIAGNOSIS — I10 Essential (primary) hypertension: Secondary | ICD-10-CM | POA: Diagnosis not present

## 2019-10-10 DIAGNOSIS — Z125 Encounter for screening for malignant neoplasm of prostate: Secondary | ICD-10-CM | POA: Diagnosis not present

## 2019-10-10 DIAGNOSIS — E291 Testicular hypofunction: Secondary | ICD-10-CM | POA: Diagnosis not present

## 2019-10-10 MED ORDER — PREDNISONE 20 MG PO TABS: 40.0000 mg | ORAL_TABLET | Freq: Every day | ORAL | 0 refills | Status: DC

## 2019-10-10 NOTE — Telephone Encounter (Signed)
Victor Price called and is requesting prednisone for his cough. He was seen yesterday. Please advise.

## 2019-10-10 NOTE — Telephone Encounter (Signed)
Medication sent to pharmacy   Meds ordered this encounter  Medications  . predniSONE (DELTASONE) 20 MG tablet    Sig: Take 2 tablets (40 mg total) by mouth daily with breakfast.    Dispense:  10 tablet    Refill:  0

## 2019-10-10 NOTE — Telephone Encounter (Signed)
Pt advised.

## 2019-10-11 ENCOUNTER — Telehealth: Payer: Self-pay | Admitting: Family Medicine

## 2019-10-11 ENCOUNTER — Encounter: Payer: Self-pay | Admitting: Family Medicine

## 2019-10-11 LAB — TESTOSTERONE TOTAL,FREE,BIO, MALES
Albumin: 4 g/dL (ref 3.6–5.1)
Sex Hormone Binding: 44 nmol/L (ref 22–77)
Testosterone, Bioavailable: 98.7 ng/dL — ABNORMAL LOW (ref >=110.0)
Testosterone, Free: 53.7 pg/mL (ref 46.0–224.0)
Testosterone: 497 ng/dL (ref 250–827)

## 2019-10-11 LAB — PSA: PSA: 3.2 ng/mL (ref <=4.0)

## 2019-10-11 LAB — CBC
HCT: 36.3 % — ABNORMAL LOW (ref 38.5–50.0)
Hemoglobin: 11.9 g/dL — ABNORMAL LOW (ref 13.2–17.1)
MCH: 30.3 pg (ref 27.0–33.0)
MCHC: 32.8 g/dL (ref 32.0–36.0)
MCV: 92.4 fL (ref 80.0–100.0)
MPV: 9.4 fL (ref 7.5–12.5)
Platelets: 399 10*3/uL (ref 140–400)
RBC: 3.93 10*6/uL — ABNORMAL LOW (ref 4.20–5.80)
RDW: 12.7 % (ref 11.0–15.0)
WBC: 7.6 10*3/uL (ref 3.8–10.8)

## 2019-10-11 MED ORDER — LIDOCAINE-HYDROCORTISONE ACE 3-0.5 % RE KIT: PACK | RECTAL | 99 refills | Status: DC

## 2019-10-11 NOTE — Telephone Encounter (Signed)
Patient calls and states he is having rectal spasms again and it is hurting bad.  States you use to give him something for it. In med history looks like given Anamantle. Please advise. KG LPN

## 2019-10-11 NOTE — Telephone Encounter (Signed)
Thank you for researching.  Sent new prescription over.

## 2019-10-12 NOTE — Telephone Encounter (Signed)
Called patient and advised med sent to pharmacy for him. KG LPN

## 2019-10-15 ENCOUNTER — Other Ambulatory Visit: Payer: Self-pay | Admitting: Family Medicine

## 2019-10-15 DIAGNOSIS — R351 Nocturia: Secondary | ICD-10-CM

## 2019-10-19 ENCOUNTER — Other Ambulatory Visit: Payer: Self-pay

## 2019-10-19 ENCOUNTER — Ambulatory Visit: Payer: Medicare HMO | Admitting: Orthopaedic Surgery

## 2019-10-19 ENCOUNTER — Encounter: Payer: Self-pay | Admitting: Orthopaedic Surgery

## 2019-10-19 DIAGNOSIS — R200 Anesthesia of skin: Secondary | ICD-10-CM | POA: Insufficient documentation

## 2019-10-19 DIAGNOSIS — R202 Paresthesia of skin: Secondary | ICD-10-CM

## 2019-10-19 DIAGNOSIS — Z96642 Presence of left artificial hip joint: Secondary | ICD-10-CM

## 2019-10-19 NOTE — Addendum Note (Signed)
Addended by: Meyer Cory on: 10/19/2019 02:52 PM   Modules accepted: Orders

## 2019-10-19 NOTE — Progress Notes (Signed)
Office Visit Note   Patient: Victor Price           Date of Birth: 21-Nov-1950           MRN: [id number] Visit Date: 10/19/2019              Requested by: Hali Marry, [address] [place],  [place] [address] PCP: Hali Marry, MD   Assessment & Plan: Visit Diagnoses:  1. H/O total hip arthroplasty, left   2. Numbness and tingling in right hand     Plan: Patient is very happy with results were told up arthroplasty.  With his hand numbness bothering him wakes him up at night has tried splinting without relief will proceed with nerve conduction velocities for evaluation of carpal tunnel syndrome worse on the right than left.  Office follow-up after test.  Follow-Up Instructions: No follow-ups on file.   Orders:  No orders of the defined types were placed in this encounter.  No orders of the defined types were placed in this encounter.     Procedures: No procedures performed   Clinical Data: No additional findings.   Subjective: Chief Complaint  Patient presents with  . Left Hip - Follow-up    08/24/2019 Left THA    HPI patient turns post left total of arthroplasty gets rapidly from sitting standing walking was off pain medicine and just a few days and states she has absolutely no pain.  He is back doing yard work activities and states she still having problems with numbness in his right hand and noticed decreased grip strength he has to squeeze a ball at night pain wakes him up at night and he is used splinting in the past.  Has not had nerve conduction velocities.  He continues to have some tingling in his feet and has some lateral recess stenosis at the L4-5 level.  No claudication symptoms.  Review of Systems updated unchanged other than as mentioned in HPI.   Objective: Vital Signs: BP (!) 160/89   Pulse 89   Ht 6' (1.829 m)   Wt 165 lb (74.8 kg)   BMI 22.38 kg/m   Physical Exam Constitutional:      Appearance: He is  well-developed.  HENT:     Head: Normocephalic and atraumatic.  Eyes:     Pupils: Pupils are equal, round, and reactive to light.  Neck:     Thyroid: No thyromegaly.     Trachea: No tracheal deviation.  Cardiovascular:     Rate and Rhythm: Normal rate.  Pulmonary:     Effort: Pulmonary effort is normal.     Breath sounds: No wheezing.  Abdominal:     General: Bowel sounds are normal.     Palpations: Abdomen is soft.  Skin:    General: Skin is warm and dry.     Capillary Refill: Capillary refill takes less than 2 seconds.  Neurological:     Mental Status: He is alert and oriented to person, place, and time.  Psychiatric:        Behavior: Behavior normal.        Thought Content: Thought content normal.        Judgment: Judgment normal.     Ortho Exam positive Tinel's Phalen's over the carpal canal right with thenar atrophy on the right hand but none on the left hand.  Decreased sensation radial 3 fingers.  Interossei are strong ulnar nerve at the elbow is  normal.  Specialty Comments:  No specialty comments available.  Imaging: No results found.   PMFS History: Patient Active Problem List   Diagnosis Date Noted  . H/O total hip arthroplasty, left 10/19/2019  . Numbness and tingling in right hand 10/19/2019  . DDD (degenerative disc disease), lumbar 06/05/2019  . Subclinical hyperthyroidism 10/31/2017  . Hypogonadism in male 10/31/2017  . CKD (chronic kidney disease) stage 3, GFR 30-59 ml/min 02/08/2017  . Bronchiectasis ([place]) 07/20/2016  . Radiculitis of right cervical region 08/28/2014  . DDD (degenerative disc disease), cervical 08/28/2014  . Insomnia 04/30/2014  . Iliotibial band syndrome of left side 04/05/2013  . Peripheral neuropathy 04/02/2013  . Herniated disc 01/29/2013  . BPH (benign prostatic hyperplasia) 12/14/2011  . ERECTILE DYSFUNCTION, ORGANIC 07/23/2010  . THYROID NODULE, LEFT 05/23/2007  . Hyperlipidemia 05/17/2006  . HYPERTENSION, BENIGN  SYSTEMIC 05/17/2006   Past Medical History:  Diagnosis Date  . BPH (benign prostatic hyperplasia)   . Bronchiectasis with acute lower respiratory infection ([place]) 08/13/2019  . Bronchitis    inhaler prn  . ED (erectile dysfunction)   . GERD (gastroesophageal reflux disease)   . HLD (hyperlipidemia)   . Hypertension   . Pneumonia    gets recurrent PNA in left lung, recent dx 08/13/19 left lower lobe  . Seasonal allergies     No family history on file.  Past Surgical History:  Procedure Laterality Date  . BACK SURGERY  1980s   [facility]  . COLONOSCOPY  2004, 2012   several - polyps  . MULTIPLE TOOTH EXTRACTIONS    . TOTAL HIP ARTHROPLASTY Left 08/24/2019   Procedure: LEFT TOTAL HIP ARTHROPLASTY-DIRECT ANTERIOR;  Surgeon: Marybelle Killings, MD;  Location: [facility];  Service: Orthopedics;  Laterality: Left;   Social History   Occupational History  . Occupation: [employer]    Employer: [employer]  Tobacco Use  . Smoking status: Never Smoker  . Smokeless tobacco: Never Used  Substance and Sexual Activity  . Alcohol use: No  . Drug use: No  . Sexual activity: Yes

## 2019-10-25 ENCOUNTER — Encounter: Payer: Self-pay | Admitting: Orthopaedic Surgery

## 2019-10-27 ENCOUNTER — Other Ambulatory Visit: Payer: Self-pay | Admitting: Family Medicine

## 2019-10-27 DIAGNOSIS — E785 Hyperlipidemia, unspecified: Secondary | ICD-10-CM

## 2019-10-27 DIAGNOSIS — I10 Essential (primary) hypertension: Secondary | ICD-10-CM

## 2019-10-29 NOTE — Telephone Encounter (Signed)
Med sent.

## 2019-11-14 ENCOUNTER — Ambulatory Visit (INDEPENDENT_AMBULATORY_CARE_PROVIDER_SITE_OTHER): Payer: Medicare HMO | Admitting: Physical Medicine and Rehabilitation

## 2019-11-14 ENCOUNTER — Other Ambulatory Visit: Payer: Self-pay

## 2019-11-14 ENCOUNTER — Encounter: Payer: Self-pay | Admitting: Physical Medicine and Rehabilitation

## 2019-11-14 DIAGNOSIS — R202 Paresthesia of skin: Secondary | ICD-10-CM

## 2019-11-14 NOTE — Progress Notes (Signed)
 .  Numeric Pain Rating Scale and Functional Assessment Average Pain 0   In the last MONTH (on 0-10 scale) has pain interfered with the following?  1. General activity like being  able to carry out your everyday physical activities such as walking, climbing stairs, carrying groceries, or moving a chair?  Rating(8)     

## 2019-11-15 NOTE — Procedures (Signed)
EMG & NCV Findings: Evaluation of the left median motor nerve showed decreased conduction velocity (Elbow-Wrist, 48 m/s).  The right median motor nerve showed prolonged distal onset latency (10.2 ms), reduced amplitude (1.3 mV), and decreased conduction velocity (Elbow-Wrist, 33 m/s).  The left median (across palm) sensory nerve showed prolonged distal peak latency (Wrist, 4.9 ms) and prolonged distal peak latency (Palm, 2.6 ms).  The right median (across palm) sensory nerve showed no response (Wrist) and prolonged distal peak latency (Palm, 2.9 ms).  The right ulnar sensory nerve showed prolonged distal peak latency (4.0 ms), reduced amplitude (13.5 V), and decreased conduction velocity (Wrist-5th Digit, 35 m/s).  All remaining nerves (as indicated in the following tables) were within normal limits.  Left vs. Right side comparison data for the median motor nerve indicates abnormal L-R latency difference (6.0 ms), abnormal L-R amplitude difference (81.4 %), and abnormal L-R velocity difference (Elbow-Wrist, 15 m/s).    All examined muscles (as indicated in the following table) showed no evidence of electrical instability.    Impression: The above electrodiagnostic study is ABNORMAL and reveals evidence of:  1.  A severe right median nerve entrapment at the wrist (carpal tunnel syndrome) affecting sensory and motor components. The lesion is characterized by sensory and motor demyelination with evidence of axonal injury.   2. A moderate left median nerve entrapment at the wrist (carpal tunnel syndrome) affecting sensory and motor components.  There is no significant electrodiagnostic evidence of any other focal nerve entrapment, brachial plexopathy or generalized peripheral neuropathy.    Recommendations: 1.  Follow-up with referring physician. 2.  Continue current management of symptoms. 3.  Suggest surgical evaluation.  ___________________________ Laurence Spates FAAPMR Board Certified, American  Board of Physical Medicine and Rehabilitation    Nerve Conduction Studies Anti Sensory Summary Table   Stim Site NR Peak (ms) Norm Peak (ms) P-T Amp (V) Norm P-T Amp Site1 Site2 Delta-P (ms) Dist (cm) Vel (m/s) Norm Vel (m/s)  Left Median Acr Palm Anti Sensory (2nd Digit)  32.3C  Wrist    *4.9 <3.6 19.5 >10 Wrist Palm 2.3 0.0    Palm    *2.6 <2.0 19.1         Right Median Acr Palm Anti Sensory (2nd Digit)  30.3C  Wrist *NR  <3.6  >10 Wrist Palm  0.0    Palm    *2.9 <2.0 4.4         Right Radial Anti Sensory (Base 1st Digit)  30.4C  Wrist    2.8 <3.1 12.6  Wrist Base 1st Digit 2.8 0.0    Right Ulnar Anti Sensory (5th Digit)  30.7C  Wrist    *4.0 <3.7 *13.5 >15.0 Wrist 5th Digit 4.0 14.0 *35 >38   Motor Summary Table   Stim Site NR Onset (ms) Norm Onset (ms) O-P Amp (mV) Norm O-P Amp Site1 Site2 Delta-0 (ms) Dist (cm) Vel (m/s) Norm Vel (m/s)  Left Median Motor (Abd Poll Brev)  32.6C  Wrist    4.2 <4.2 7.0 >5 Elbow Wrist 4.9 23.5 *48 >50  Elbow    9.1  6.5         Right Median Motor (Abd Poll Brev)  30.4C  Wrist    *10.2 <4.2 *1.3 >5 Elbow Wrist 7.2 23.8 *33 >50  Elbow    17.4  1.0         Right Ulnar Motor (Abd Dig Min)  30.2C  Wrist    3.5 <4.2 7.1 >3 B Elbow Wrist  4.3 24.0 56 >53  B Elbow    7.8  9.7  A Elbow B Elbow 1.7 10.0 59 >53  A Elbow    9.5  9.6          EMG   Side Muscle Nerve Root Ins Act Fibs Psw Amp Dur Poly Recrt Int Fraser Din Comment  Right Abd Poll Brev Median C8-T1 Nml Nml Nml Nml Nml 0 Nml Nml   Right 1stDorInt Ulnar C8-T1 Nml Nml Nml Nml Nml 0 Nml Nml     Nerve Conduction Studies Anti Sensory Left/Right Comparison   Stim Site L Lat (ms) R Lat (ms) L-R Lat (ms) L Amp (V) R Amp (V) L-R Amp (%) Site1 Site2 L Vel (m/s) R Vel (m/s) L-R Vel (m/s)  Median Acr Palm Anti Sensory (2nd Digit)  32.3C  Wrist *4.9   19.5   Wrist Palm     Palm *2.6 *2.9 0.3 19.1 4.4 77.0       Radial Anti Sensory (Base 1st Digit)  30.4C  Wrist  2.8   12.6  Wrist Base 1st  Digit     Ulnar Anti Sensory (5th Digit)  30.7C  Wrist  *4.0   *13.5  Wrist 5th Digit  *35    Motor Left/Right Comparison   Stim Site L Lat (ms) R Lat (ms) L-R Lat (ms) L Amp (mV) R Amp (mV) L-R Amp (%) Site1 Site2 L Vel (m/s) R Vel (m/s) L-R Vel (m/s)  Median Motor (Abd Poll Brev)  32.6C  Wrist 4.2 *10.2 *6.0 7.0 *1.3 *81.4 Elbow Wrist *48 *33 *15  Elbow 9.1 17.4 8.3 6.5 1.0 84.6       Ulnar Motor (Abd Dig Min)  30.2C  Wrist  3.5   7.1  B Elbow Wrist  56   B Elbow  7.8   9.7  A Elbow B Elbow  59   A Elbow  9.5   9.6           Waveforms:

## 2019-11-15 NOTE — Progress Notes (Signed)
Victor Price - 69 y.o. male MRN [id number]  Date of birth: Dec 25, 1950  Office Visit Note: Visit Date: 11/14/2019 PCP: Hali Marry, MD Referred by: Hali Marry, *  Subjective: Chief Complaint  Patient presents with  . Right Hand - Numbness   HPI: Victor Price is a 69 y.o. male who comes in today For electrodiagnostic study of both upper limbs at the request of Dr. Rodell Perna.  Patient is right-hand dominant with a history of years of off-and-on pain numbness and tingling in the right hand but in particular since October of last year especially gotten much worse.  He has numbness and pain in the right thumb index and middle finger and half of the fourth digit.  He gets minor symptoms on the left.  He reports a lot of weakness and dropping with the right hand.  He did use a brace that used to help some.  He has no history of prior electrodiagnostic study.  He carries a him on his problem list this is peripheral neuropathy but as he understands that it was just because he has had back surgery and some symptoms in the leg.  He has no history of diabetes or other reason to have a neuropathy.  ROS Otherwise per HPI.  Assessment & Plan: Visit Diagnoses:  1. Paresthesia of skin     Plan: Impression: The above electrodiagnostic study is ABNORMAL and reveals evidence of:  1.  A severe right median nerve entrapment at the wrist (carpal tunnel syndrome) affecting sensory and motor components. The lesion is characterized by sensory and motor demyelination with evidence of axonal injury.   2. A moderate left median nerve entrapment at the wrist (carpal tunnel syndrome) affecting sensory and motor components.  There is no significant electrodiagnostic evidence of any other focal nerve entrapment, brachial plexopathy or generalized peripheral neuropathy.    Recommendations: 1.  Follow-up with referring physician. 2.  Continue current management of symptoms. 3.  Suggest  surgical evaluation.   Meds & Orders: No orders of the defined types were placed in this encounter.   Orders Placed This Encounter  Procedures  . NCV with EMG (electromyography)    Follow-up: Return for Rodell Perna, MD.   Procedures: No procedures performed  EMG & NCV Findings: Evaluation of the left median motor nerve showed decreased conduction velocity (Elbow-Wrist, 48 m/s).  The right median motor nerve showed prolonged distal onset latency (10.2 ms), reduced amplitude (1.3 mV), and decreased conduction velocity (Elbow-Wrist, 33 m/s).  The left median (across palm) sensory nerve showed prolonged distal peak latency (Wrist, 4.9 ms) and prolonged distal peak latency (Palm, 2.6 ms).  The right median (across palm) sensory nerve showed no response (Wrist) and prolonged distal peak latency (Palm, 2.9 ms).  The right ulnar sensory nerve showed prolonged distal peak latency (4.0 ms), reduced amplitude (13.5 V), and decreased conduction velocity (Wrist-5th Digit, 35 m/s).  All remaining nerves (as indicated in the following tables) were within normal limits.  Left vs. Right side comparison data for the median motor nerve indicates abnormal L-R latency difference (6.0 ms), abnormal L-R amplitude difference (81.4 %), and abnormal L-R velocity difference (Elbow-Wrist, 15 m/s).    All examined muscles (as indicated in the following table) showed no evidence of electrical instability.    Impression: The above electrodiagnostic study is ABNORMAL and reveals evidence of:  1.  A severe right median nerve entrapment at the wrist (carpal tunnel syndrome) affecting sensory and motor components.  The lesion is characterized by sensory and motor demyelination with evidence of axonal injury.   2. A moderate left median nerve entrapment at the wrist (carpal tunnel syndrome) affecting sensory and motor components.  There is no significant electrodiagnostic evidence of any other focal nerve entrapment, brachial  plexopathy or generalized peripheral neuropathy.    Recommendations: 1.  Follow-up with referring physician. 2.  Continue current management of symptoms. 3.  Suggest surgical evaluation.  ___________________________ Laurence Spates FAAPMR Board Certified, American Board of Physical Medicine and Rehabilitation    Nerve Conduction Studies Anti Sensory Summary Table   Stim Site NR Peak (ms) Norm Peak (ms) P-T Amp (V) Norm P-T Amp Site1 Site2 Delta-P (ms) Dist (cm) Vel (m/s) Norm Vel (m/s)  Left Median Acr Palm Anti Sensory (2nd Digit)  32.3C  Wrist    *4.9 <3.6 19.5 >10 Wrist Palm 2.3 0.0    Palm    *2.6 <2.0 19.1         Right Median Acr Palm Anti Sensory (2nd Digit)  30.3C  Wrist *NR  <3.6  >10 Wrist Palm  0.0    Palm    *2.9 <2.0 4.4         Right Radial Anti Sensory (Base 1st Digit)  30.4C  Wrist    2.8 <3.1 12.6  Wrist Base 1st Digit 2.8 0.0    Right Ulnar Anti Sensory (5th Digit)  30.7C  Wrist    *4.0 <3.7 *13.5 >15.0 Wrist 5th Digit 4.0 14.0 *35 >38   Motor Summary Table   Stim Site NR Onset (ms) Norm Onset (ms) O-P Amp (mV) Norm O-P Amp Site1 Site2 Delta-0 (ms) Dist (cm) Vel (m/s) Norm Vel (m/s)  Left Median Motor (Abd Poll Brev)  32.6C  Wrist    4.2 <4.2 7.0 >5 Elbow Wrist 4.9 23.5 *48 >50  Elbow    9.1  6.5         Right Median Motor (Abd Poll Brev)  30.4C  Wrist    *10.2 <4.2 *1.3 >5 Elbow Wrist 7.2 23.8 *33 >50  Elbow    17.4  1.0         Right Ulnar Motor (Abd Dig Min)  30.2C  Wrist    3.5 <4.2 7.1 >3 B Elbow Wrist 4.3 24.0 56 >53  B Elbow    7.8  9.7  A Elbow B Elbow 1.7 10.0 59 >53  A Elbow    9.5  9.6          EMG   Side Muscle Nerve Root Ins Act Fibs Psw Amp Dur Poly Recrt Int Fraser Din Comment  Right Abd Poll Brev Median C8-T1 Nml Nml Nml Nml Nml 0 Nml Nml   Right 1stDorInt Ulnar C8-T1 Nml Nml Nml Nml Nml 0 Nml Nml     Nerve Conduction Studies Anti Sensory Left/Right Comparison   Stim Site L Lat (ms) R Lat (ms) L-R Lat (ms) L Amp (V) R Amp (V) L-R  Amp (%) Site1 Site2 L Vel (m/s) R Vel (m/s) L-R Vel (m/s)  Median Acr Palm Anti Sensory (2nd Digit)  32.3C  Wrist *4.9   19.5   Wrist Palm     Palm *2.6 *2.9 0.3 19.1 4.4 77.0       Radial Anti Sensory (Base 1st Digit)  30.4C  Wrist  2.8   12.6  Wrist Base 1st Digit     Ulnar Anti Sensory (5th Digit)  30.7C  Wrist  *4.0   *13.5  Wrist 5th  Digit  *35    Motor Left/Right Comparison   Stim Site L Lat (ms) R Lat (ms) L-R Lat (ms) L Amp (mV) R Amp (mV) L-R Amp (%) Site1 Site2 L Vel (m/s) R Vel (m/s) L-R Vel (m/s)  Median Motor (Abd Poll Brev)  32.6C  Wrist 4.2 *10.2 *6.0 7.0 *1.3 *81.4 Elbow Wrist *48 *33 *15  Elbow 9.1 17.4 8.3 6.5 1.0 84.6       Ulnar Motor (Abd Dig Min)  30.2C  Wrist  3.5   7.1  B Elbow Wrist  56   B Elbow  7.8   9.7  A Elbow B Elbow  59   A Elbow  9.5   9.6           Waveforms:                 Clinical History: No specialty comments available.   He reports that he has never smoked. He has never used smokeless tobacco. No results for input(s): HGBA1C, LABURIC in the last 8760 hours.  Objective:  VS:  HT:    WT:   BMI:     BP:   HR: bpm  TEMP: ( )  RESP:  Physical Exam Musculoskeletal:        General: No tenderness.     Comments: Inspection reveals atrophy and flattening of the right APB compared to the left but no atrophy of the bilateral  FDI or hand intrinsics. There is no swelling, color changes, allodynia or dystrophic changes.  The right hand is much colder than the left hand.  There is 5 out of 5 strength in the bilateral wrist extension, finger abduction and long finger flexion.  There is decreased sensation in her right median nerve distribution.  There is a negative Hoffmann's test bilaterally.  Skin:    General: Skin is warm and dry.     Findings: No erythema or rash.  Neurological:     General: No focal deficit present.     Mental Status: He is alert and oriented to person, place, and time.     Sensory: No sensory deficit.      Motor: No weakness or abnormal muscle tone.     Coordination: Coordination normal.     Gait: Gait normal.  Psychiatric:        Mood and Affect: Mood normal.        Behavior: Behavior normal.        Thought Content: Thought content normal.     Ortho Exam Imaging: No results found.  Past Medical/Family/Surgical/Social History: Medications & Allergies reviewed per EMR, new medications updated. Patient Active Problem List   Diagnosis Date Noted  . H/O total hip arthroplasty, left 10/19/2019  . Numbness and tingling in right hand 10/19/2019  . DDD (degenerative disc disease), lumbar 06/05/2019  . Subclinical hyperthyroidism 10/31/2017  . Hypogonadism in male 10/31/2017  . CKD (chronic kidney disease) stage 3, GFR 30-59 ml/min 02/08/2017  . Bronchiectasis ([place]) 07/20/2016  . Radiculitis of right cervical region 08/28/2014  . DDD (degenerative disc disease), cervical 08/28/2014  . Insomnia 04/30/2014  . Peripheral neuropathy 04/02/2013  . Herniated disc 01/29/2013  . BPH (benign prostatic hyperplasia) 12/14/2011  . ERECTILE DYSFUNCTION, ORGANIC 07/23/2010  . THYROID NODULE, LEFT 05/23/2007  . Hyperlipidemia 05/17/2006  . HYPERTENSION, BENIGN SYSTEMIC 05/17/2006   Past Medical History:  Diagnosis Date  . BPH (benign prostatic hyperplasia)   . Bronchiectasis with acute lower respiratory infection ([place]) 08/13/2019  . Bronchitis  inhaler prn  . ED (erectile dysfunction)   . GERD (gastroesophageal reflux disease)   . HLD (hyperlipidemia)   . Hypertension   . Pneumonia    gets recurrent PNA in left lung, recent dx 08/13/19 left lower lobe  . Seasonal allergies    History reviewed. No pertinent family history. Past Surgical History:  Procedure Laterality Date  . BACK SURGERY  1980s   [facility]  . COLONOSCOPY  2004, 2012   several - polyps  . MULTIPLE TOOTH EXTRACTIONS    . TOTAL HIP ARTHROPLASTY Left 08/24/2019   Procedure: LEFT TOTAL HIP ARTHROPLASTY-DIRECT ANTERIOR;  Surgeon:  Marybelle Killings, MD;  Location: [facility];  Service: Orthopedics;  Laterality: Left;   Social History   Occupational History  . Occupation: [employer]    Employer: [employer]  Tobacco Use  . Smoking status: Never Smoker  . Smokeless tobacco: Never Used  Substance and Sexual Activity  . Alcohol use: No  . Drug use: No  . Sexual activity: Yes

## 2019-11-21 ENCOUNTER — Ambulatory Visit: Payer: Medicare HMO | Admitting: Orthopaedic Surgery

## 2019-11-24 ENCOUNTER — Other Ambulatory Visit: Payer: Self-pay | Admitting: Family Medicine

## 2019-11-26 DIAGNOSIS — G5601 Carpal tunnel syndrome, right upper limb: Secondary | ICD-10-CM | POA: Diagnosis not present

## 2019-12-04 ENCOUNTER — Encounter: Payer: Self-pay | Admitting: Orthopaedic Surgery

## 2019-12-04 ENCOUNTER — Other Ambulatory Visit: Payer: Self-pay

## 2019-12-04 ENCOUNTER — Ambulatory Visit (INDEPENDENT_AMBULATORY_CARE_PROVIDER_SITE_OTHER): Payer: Medicare HMO | Admitting: Orthopaedic Surgery

## 2019-12-04 VITALS — BP 149/86 | HR 65 | Ht 72.0 in | Wt 165.0 lb

## 2019-12-04 DIAGNOSIS — G5601 Carpal tunnel syndrome, right upper limb: Secondary | ICD-10-CM | POA: Diagnosis not present

## 2019-12-04 DIAGNOSIS — Z96642 Presence of left artificial hip joint: Secondary | ICD-10-CM

## 2019-12-04 DIAGNOSIS — G5602 Carpal tunnel syndrome, left upper limb: Secondary | ICD-10-CM | POA: Insufficient documentation

## 2019-12-04 NOTE — Progress Notes (Signed)
Office Visit Note   Patient: Victor Price           Date of Birth: 07-Feb-1951           MRN: [id number] Visit Date: 12/04/2019              Requested by: Hali Marry, [address] [place],  [place] [address] PCP: Hali Marry, MD   Assessment & Plan: Visit Diagnoses:  1. H/O total hip arthroplasty, left   2. Carpal tunnel syndrome, right upper limb   3. Carpal tunnel syndrome, left upper limb     Plan: 8 days post right carpal tunnel release incision looks good respond applied with stockinette he can remove it to wash his hand and when he takes a shower.  Return 1 week for suture removal right hand.  We can discuss scheduling for the opposite left hand a month later.  He is very happy with the results of his total of arthroplasty.  Follow-Up Instructions: Return in about 1 week (around 12/11/2019).   Orders:  No orders of the defined types were placed in this encounter.  No orders of the defined types were placed in this encounter.     Procedures: No procedures performed   Clinical Data: No additional findings.   Subjective: Chief Complaint  Patient presents with  . Right Hand - Routine Post Op    11/26/2019 Right CTR    HPI follow-up right carpal tunnel release  Review of Systems unchanged   Objective: Vital Signs: BP (!) 149/86   Pulse 65   Ht 6' (1.829 m)   Wt 165 lb (74.8 kg)   BMI 22.38 kg/m   Physical Exam unchanged  Ortho Exam sutures intact right hand post carpal tunnel release.  Still decreased sensation but improved from preop.  Specialty Comments:  No specialty comments available.  Imaging: No results found.   PMFS History: Patient Active Problem List   Diagnosis Date Noted  . Carpal tunnel syndrome, right upper limb 12/04/2019  . Carpal tunnel syndrome, left upper limb 12/04/2019  . H/O total hip arthroplasty, left 10/19/2019  . DDD (degenerative disc disease), lumbar 06/05/2019  .  Subclinical hyperthyroidism 10/31/2017  . Hypogonadism in male 10/31/2017  . CKD (chronic kidney disease) stage 3, GFR 30-59 ml/min 02/08/2017  . Bronchiectasis ([place]) 07/20/2016  . Radiculitis of right cervical region 08/28/2014  . DDD (degenerative disc disease), cervical 08/28/2014  . Insomnia 04/30/2014  . Peripheral neuropathy 04/02/2013  . Herniated disc 01/29/2013  . BPH (benign prostatic hyperplasia) 12/14/2011  . ERECTILE DYSFUNCTION, ORGANIC 07/23/2010  . THYROID NODULE, LEFT 05/23/2007  . Hyperlipidemia 05/17/2006  . HYPERTENSION, BENIGN SYSTEMIC 05/17/2006   Past Medical History:  Diagnosis Date  . BPH (benign prostatic hyperplasia)   . Bronchiectasis with acute lower respiratory infection ([place]) 08/13/2019  . Bronchitis    inhaler prn  . ED (erectile dysfunction)   . GERD (gastroesophageal reflux disease)   . HLD (hyperlipidemia)   . Hypertension   . Pneumonia    gets recurrent PNA in left lung, recent dx 08/13/19 left lower lobe  . Seasonal allergies     No family history on file.  Past Surgical History:  Procedure Laterality Date  . BACK SURGERY  1980s   [facility]  . COLONOSCOPY  2004, 2012   several - polyps  . MULTIPLE TOOTH EXTRACTIONS    . TOTAL HIP ARTHROPLASTY Left 08/24/2019   Procedure: LEFT TOTAL HIP ARTHROPLASTY-DIRECT  ANTERIOR;  Surgeon: Marybelle Killings, MD;  Location: [facility];  Service: Orthopedics;  Laterality: Left;   Social History   Occupational History  . Occupation: [employer]    Employer: [employer]  Tobacco Use  . Smoking status: Never Smoker  . Smokeless tobacco: Never Used  Substance and Sexual Activity  . Alcohol use: No  . Drug use: No  . Sexual activity: Yes

## 2019-12-07 ENCOUNTER — Other Ambulatory Visit: Payer: Self-pay | Admitting: Family Medicine

## 2019-12-07 DIAGNOSIS — B351 Tinea unguium: Secondary | ICD-10-CM

## 2019-12-11 ENCOUNTER — Encounter: Payer: Self-pay | Admitting: Orthopaedic Surgery

## 2019-12-11 ENCOUNTER — Other Ambulatory Visit: Payer: Self-pay

## 2019-12-11 ENCOUNTER — Ambulatory Visit: Payer: Medicare HMO | Admitting: Orthopaedic Surgery

## 2019-12-11 VITALS — Ht 72.0 in | Wt 165.0 lb

## 2019-12-11 DIAGNOSIS — G5602 Carpal tunnel syndrome, left upper limb: Secondary | ICD-10-CM

## 2019-12-11 NOTE — Progress Notes (Signed)
   Post-Op Visit Note   Patient: Victor Price           Date of Birth: 03/08/1951           MRN: [id number] Visit Date: 12/11/2019 PCP: Hali Marry, MD   Assessment & Plan:post right CTR,      Ongoing Left CTS  Chief Complaint:  Chief Complaint  Patient presents with  . Right Hand - Follow-up    11/26/2019 Right CTR   Visit Diagnoses:  1. Carpal tunnel syndrome, left upper limb    2.     Post op right CTR 11/26/19  Plan: Patient might proceed with left carpal tunnel release in about 2 weeks with identical anesthesia that he had for his right hand.  He is happy the surgical result and sutures were harvested today from his right carpal tunnel release.  He is getting return of sensation in his fingertips.  Follow-Up Instructions: Return in about 3 weeks (around 01/01/2020).   Orders:  No orders of the defined types were placed in this encounter.  No orders of the defined types were placed in this encounter.   Imaging: No results found.  PMFS History: Patient Active Problem List   Diagnosis Date Noted  . Carpal tunnel syndrome, left upper limb 12/04/2019  . H/O total hip arthroplasty, left 10/19/2019  . DDD (degenerative disc disease), lumbar 06/05/2019  . Subclinical hyperthyroidism 10/31/2017  . Hypogonadism in male 10/31/2017  . CKD (chronic kidney disease) stage 3, GFR 30-59 ml/min 02/08/2017  . Bronchiectasis ([place]) 07/20/2016  . Radiculitis of right cervical region 08/28/2014  . DDD (degenerative disc disease), cervical 08/28/2014  . Insomnia 04/30/2014  . Peripheral neuropathy 04/02/2013  . Herniated disc 01/29/2013  . BPH (benign prostatic hyperplasia) 12/14/2011  . ERECTILE DYSFUNCTION, ORGANIC 07/23/2010  . THYROID NODULE, LEFT 05/23/2007  . Hyperlipidemia 05/17/2006  . HYPERTENSION, BENIGN SYSTEMIC 05/17/2006   Past Medical History:  Diagnosis Date  . BPH (benign prostatic hyperplasia)   . Bronchiectasis with acute lower respiratory infection  ([place]) 08/13/2019  . Bronchitis    inhaler prn  . ED (erectile dysfunction)   . GERD (gastroesophageal reflux disease)   . HLD (hyperlipidemia)   . Hypertension   . Pneumonia    gets recurrent PNA in left lung, recent dx 08/13/19 left lower lobe  . Seasonal allergies     No family history on file.  Past Surgical History:  Procedure Laterality Date  . BACK SURGERY  1980s   [facility]  . COLONOSCOPY  2004, 2012   several - polyps  . MULTIPLE TOOTH EXTRACTIONS    . TOTAL HIP ARTHROPLASTY Left 08/24/2019   Procedure: LEFT TOTAL HIP ARTHROPLASTY-DIRECT ANTERIOR;  Surgeon: Marybelle Killings, MD;  Location: [facility];  Service: Orthopedics;  Laterality: Left;   Social History   Occupational History  . Occupation: [employer]    Employer: [employer]  Tobacco Use  . Smoking status: Never Smoker  . Smokeless tobacco: Never Used  Substance and Sexual Activity  . Alcohol use: No  . Drug use: No  . Sexual activity: Yes

## 2019-12-24 DIAGNOSIS — G5602 Carpal tunnel syndrome, left upper limb: Secondary | ICD-10-CM | POA: Diagnosis not present

## 2019-12-26 ENCOUNTER — Other Ambulatory Visit: Payer: Self-pay | Admitting: Surgery

## 2019-12-26 NOTE — Telephone Encounter (Signed)
Please advise 

## 2019-12-26 NOTE — Telephone Encounter (Signed)
MY patient 

## 2019-12-27 NOTE — Telephone Encounter (Signed)
Sent to pharmacy 

## 2019-12-27 NOTE — Telephone Encounter (Signed)
Ok refill # 30 tabs same sig just one half total amount should do it . Thank you

## 2020-01-08 ENCOUNTER — Other Ambulatory Visit: Payer: Self-pay

## 2020-01-08 ENCOUNTER — Ambulatory Visit (INDEPENDENT_AMBULATORY_CARE_PROVIDER_SITE_OTHER): Payer: Medicare HMO | Admitting: Orthopaedic Surgery

## 2020-01-08 VITALS — Ht 72.0 in | Wt 165.0 lb

## 2020-01-08 DIAGNOSIS — G5602 Carpal tunnel syndrome, left upper limb: Secondary | ICD-10-CM

## 2020-01-08 NOTE — Progress Notes (Signed)
   Post-Op Visit Note   Patient: Victor Price           Date of Birth: 05/18/1951           MRN: [id number] Visit Date: 01/08/2020 PCP: Hali Marry, MD   Assessment & Plan:post CTR, sutures removed.   Chief Complaint:  Chief Complaint  Patient presents with  . Left Hand - Routine Post Op    Had CTR of left hand   Visit Diagnoses: post CTR , incision looks good.   Plan: sutures removed. He would like to return in 3 months to address his lumbar symptoms.   Follow-Up Instructions: Return in about 3 months (around 04/09/2020).   Orders:  No orders of the defined types were placed in this encounter.  No orders of the defined types were placed in this encounter.   Imaging: No results found.  PMFS History: Patient Active Problem List   Diagnosis Date Noted  . Carpal tunnel syndrome, left upper limb 12/04/2019  . H/O total hip arthroplasty, left 10/19/2019  . DDD (degenerative disc disease), lumbar 06/05/2019  . Subclinical hyperthyroidism 10/31/2017  . Hypogonadism in male 10/31/2017  . CKD (chronic kidney disease) stage 3, GFR 30-59 ml/min 02/08/2017  . Bronchiectasis ([place]) 07/20/2016  . Radiculitis of right cervical region 08/28/2014  . DDD (degenerative disc disease), cervical 08/28/2014  . Insomnia 04/30/2014  . Peripheral neuropathy 04/02/2013  . Herniated disc 01/29/2013  . BPH (benign prostatic hyperplasia) 12/14/2011  . ERECTILE DYSFUNCTION, ORGANIC 07/23/2010  . THYROID NODULE, LEFT 05/23/2007  . Hyperlipidemia 05/17/2006  . HYPERTENSION, BENIGN SYSTEMIC 05/17/2006   Past Medical History:  Diagnosis Date  . BPH (benign prostatic hyperplasia)   . Bronchiectasis with acute lower respiratory infection ([place]) 08/13/2019  . Bronchitis    inhaler prn  . ED (erectile dysfunction)   . GERD (gastroesophageal reflux disease)   . HLD (hyperlipidemia)   . Hypertension   . Pneumonia    gets recurrent PNA in left lung, recent dx 08/13/19 left lower lobe  .  Seasonal allergies     No family history on file.  Past Surgical History:  Procedure Laterality Date  . BACK SURGERY  1980s   [facility]  . COLONOSCOPY  2004, 2012   several - polyps  . MULTIPLE TOOTH EXTRACTIONS    . TOTAL HIP ARTHROPLASTY Left 08/24/2019   Procedure: LEFT TOTAL HIP ARTHROPLASTY-DIRECT ANTERIOR;  Surgeon: Marybelle Killings, MD;  Location: [facility];  Service: Orthopedics;  Laterality: Left;   Social History   Occupational History  . Occupation: [employer]    Employer: [employer]  Tobacco Use  . Smoking status: Never Smoker  . Smokeless tobacco: Never Used  Substance and Sexual Activity  . Alcohol use: No  . Drug use: No  . Sexual activity: Yes

## 2020-01-20 ENCOUNTER — Other Ambulatory Visit: Payer: Self-pay | Admitting: Family Medicine

## 2020-01-29 ENCOUNTER — Other Ambulatory Visit: Payer: Self-pay | Admitting: Family Medicine

## 2020-01-30 ENCOUNTER — Other Ambulatory Visit: Payer: Self-pay | Admitting: Family Medicine

## 2020-01-30 DIAGNOSIS — I10 Essential (primary) hypertension: Secondary | ICD-10-CM

## 2020-01-30 DIAGNOSIS — E785 Hyperlipidemia, unspecified: Secondary | ICD-10-CM

## 2020-02-13 DIAGNOSIS — K219 Gastro-esophageal reflux disease without esophagitis: Secondary | ICD-10-CM | POA: Diagnosis not present

## 2020-02-13 DIAGNOSIS — J302 Other seasonal allergic rhinitis: Secondary | ICD-10-CM | POA: Diagnosis not present

## 2020-02-13 DIAGNOSIS — G8929 Other chronic pain: Secondary | ICD-10-CM | POA: Diagnosis not present

## 2020-02-13 DIAGNOSIS — M1990 Unspecified osteoarthritis, unspecified site: Secondary | ICD-10-CM | POA: Diagnosis not present

## 2020-02-13 DIAGNOSIS — E785 Hyperlipidemia, unspecified: Secondary | ICD-10-CM | POA: Diagnosis not present

## 2020-02-13 DIAGNOSIS — G4700 Insomnia, unspecified: Secondary | ICD-10-CM | POA: Diagnosis not present

## 2020-02-13 DIAGNOSIS — I10 Essential (primary) hypertension: Secondary | ICD-10-CM | POA: Diagnosis not present

## 2020-02-13 DIAGNOSIS — B353 Tinea pedis: Secondary | ICD-10-CM | POA: Diagnosis not present

## 2020-02-13 DIAGNOSIS — N400 Enlarged prostate without lower urinary tract symptoms: Secondary | ICD-10-CM | POA: Diagnosis not present

## 2020-02-13 DIAGNOSIS — J90 Pleural effusion, not elsewhere classified: Secondary | ICD-10-CM | POA: Diagnosis not present

## 2020-02-25 ENCOUNTER — Other Ambulatory Visit: Payer: Self-pay | Admitting: Family Medicine

## 2020-02-25 ENCOUNTER — Other Ambulatory Visit: Payer: Self-pay | Admitting: Surgery

## 2020-02-25 DIAGNOSIS — R351 Nocturia: Secondary | ICD-10-CM

## 2020-02-25 DIAGNOSIS — G4700 Insomnia, unspecified: Secondary | ICD-10-CM

## 2020-02-26 NOTE — Telephone Encounter (Signed)
Can you please advise?

## 2020-02-29 ENCOUNTER — Other Ambulatory Visit: Payer: Self-pay | Admitting: Family Medicine

## 2020-03-27 ENCOUNTER — Other Ambulatory Visit: Payer: Self-pay | Admitting: Family Medicine

## 2020-03-28 ENCOUNTER — Other Ambulatory Visit: Payer: Self-pay | Admitting: *Deleted

## 2020-04-08 ENCOUNTER — Other Ambulatory Visit: Payer: Self-pay | Admitting: Family Medicine

## 2020-04-08 DIAGNOSIS — N529 Male erectile dysfunction, unspecified: Secondary | ICD-10-CM

## 2020-04-09 ENCOUNTER — Encounter: Payer: Self-pay | Admitting: Orthopaedic Surgery

## 2020-04-09 ENCOUNTER — Ambulatory Visit: Payer: Medicare HMO | Admitting: Orthopaedic Surgery

## 2020-04-09 VITALS — BP 134/70 | HR 69 | Ht 72.0 in | Wt 165.0 lb

## 2020-04-09 DIAGNOSIS — M5136 Other intervertebral disc degeneration, lumbar region: Secondary | ICD-10-CM

## 2020-04-09 DIAGNOSIS — Z96642 Presence of left artificial hip joint: Secondary | ICD-10-CM

## 2020-04-09 NOTE — Progress Notes (Signed)
Office Visit Note   Patient: Victor Price           Date of Birth: November 06, 1950           MRN: [id number] Visit Date: 04/09/2020              Requested by: Hali Marry, [address],  [place] [address] PCP: Hali Marry, MD   Assessment & Plan: Visit Diagnoses:  1. DDD (degenerative disc disease), lumbar   2. H/O total hip arthroplasty, left     Plan: We will set patient up for single epidural injection he does have some narrowing at L4-5 bilaterally and on the right at L5-S1.  Office follow-up after injection.  Follow-Up Instructions: Return in about 2 months (around 06/09/2020).   Orders:  No orders of the defined types were placed in this encounter.  No orders of the defined types were placed in this encounter.     Procedures: No procedures performed   Clinical Data: No additional findings.   Subjective: Chief Complaint  Patient presents with  . Right Foot - Numbness, Follow-up  . Left Foot - Follow-up, Numbness  . Lower Back - Follow-up    HPI 69 year old male returns post January 2021 left total hip arthroplasty doing well.  He said bilateral carpal tunnel releases also doing well.  He continues to have some ongoing back issue problems with back pain and numbness in both feet right equal to left.  MRI scan November 2020 showed mild central stenosis at L4-5 with some subarticular stenosis right and left at L4-5 and on the right at L5-S1.  Postop changes were seen on the right at L5.  Previous lumbar surgery done at [facility] in the [facility]s.  Review of Systems 14 point review of systems updated unchanged from 07/03/2019 office visit other than total of arthroplasty and carpal tunnel releases as mentioned above.   Objective: Vital Signs: BP 134/70   Pulse 69   Ht 6' (1.829 m)   Wt 165 lb (74.8 kg)   BMI 22.38 kg/m   Physical Exam Constitutional:      Appearance: He is well-developed.  HENT:     Head: Normocephalic  and atraumatic.  Eyes:     Pupils: Pupils are equal, round, and reactive to light.  Neck:     Thyroid: No thyromegaly.     Trachea: No tracheal deviation.  Cardiovascular:     Rate and Rhythm: Normal rate.  Pulmonary:     Effort: Pulmonary effort is normal.     Breath sounds: No wheezing.  Abdominal:     General: Bowel sounds are normal.     Palpations: Abdomen is soft.  Skin:    General: Skin is warm and dry.     Capillary Refill: Capillary refill takes less than 2 seconds.  Neurological:     Mental Status: He is alert and oriented to person, place, and time.  Psychiatric:        Behavior: Behavior normal.        Thought Content: Thought content normal.        Judgment: Judgment normal.     Ortho Exam well-healed hip incision carpal tunnel release incision.  He has some sciatic notch tenderness right and left.  Negative straight leg raising 90 degrees anterior tib EHL gastrocsoleus is intact he is able to heel and toe walk.  Distal pulses are 2+ and symmetrical.  Well-healed lumbar incision.  Specialty Comments:  No specialty comments available.  Imaging: No results found.   PMFS History: Patient Active Problem List   Diagnosis Date Noted  . Carpal tunnel syndrome, left upper limb 12/04/2019  . H/O total hip arthroplasty, left 10/19/2019  . DDD (degenerative disc disease), lumbar 06/05/2019  . Subclinical hyperthyroidism 10/31/2017  . Hypogonadism in male 10/31/2017  . CKD (chronic kidney disease) stage 3, GFR 30-59 ml/min 02/08/2017  . Bronchiectasis ([place]) 07/20/2016  . Radiculitis of right cervical region 08/28/2014  . DDD (degenerative disc disease), cervical 08/28/2014  . Insomnia 04/30/2014  . Peripheral neuropathy 04/02/2013  . Herniated disc 01/29/2013  . BPH (benign prostatic hyperplasia) 12/14/2011  . ERECTILE DYSFUNCTION, ORGANIC 07/23/2010  . THYROID NODULE, LEFT 05/23/2007  . Hyperlipidemia 05/17/2006  . HYPERTENSION, BENIGN SYSTEMIC 05/17/2006    Past Medical History:  Diagnosis Date  . BPH (benign prostatic hyperplasia)   . Bronchiectasis with acute lower respiratory infection ([place]) 08/13/2019  . Bronchitis    inhaler prn  . ED (erectile dysfunction)   . GERD (gastroesophageal reflux disease)   . HLD (hyperlipidemia)   . Hypertension   . Pneumonia    gets recurrent PNA in left lung, recent dx 08/13/19 left lower lobe  . Seasonal allergies     No family history on file.  Past Surgical History:  Procedure Laterality Date  . BACK SURGERY  1980s   [facility]  . COLONOSCOPY  2004, 2012   several - polyps  . MULTIPLE TOOTH EXTRACTIONS    . TOTAL HIP ARTHROPLASTY Left 08/24/2019   Procedure: LEFT TOTAL HIP ARTHROPLASTY-DIRECT ANTERIOR;  Surgeon: Marybelle Killings, MD;  Location: [facility];  Service: Orthopedics;  Laterality: Left;   Social History   Occupational History  . Occupation: [employer]    Employer: [employer]  Tobacco Use  . Smoking status: Never Smoker  . Smokeless tobacco: Never Used  Vaping Use  . Vaping Use: Never used  Substance and Sexual Activity  . Alcohol use: No  . Drug use: No  . Sexual activity: Yes

## 2020-04-12 DIAGNOSIS — R69 Illness, unspecified: Secondary | ICD-10-CM | POA: Diagnosis not present

## 2020-04-18 DIAGNOSIS — Z20822 Contact with and (suspected) exposure to covid-19: Secondary | ICD-10-CM | POA: Diagnosis not present

## 2020-04-23 NOTE — Progress Notes (Signed)
Virtual Visit via Video Note  I connected with Victor Price on 04/24/20 at 11:10 AM EDT by a video enabled telemedicine application and verified that I am speaking with the correct person using two identifiers.   I discussed the limitations of evaluation and management by telemedicine and the availability of in person appointments. The patient expressed understanding and agreed to proceed.  Patient location: at home  Provider location: in office  Subjective:    CC: COVID-19  HPI: Started around 9/5 with fatigue and productive cough. Lost his taste but that is better.  He reports the more intense symptoms really only lasted a couple of days.  But within a week he was feeling much better.  He did go on September 10 to get tested and found out he was positive the next day.  He has been quarantining since.  His wife is also been positive but she is doing well.  He has been vaccinated.  NO in SOB.  Chest congestion is better.  He is much better overall.  Just wanted to make sure there wasn't anything different that he needed to do since he had Covid.  Again he is feeling much better.  Follow-up hypogonadism-he is actually doing really well on his testosterone he knows he is due for blood work and wants to go ahead and get that updated as well.   Past medical history, Surgical history, Family history not pertinant except as noted below, Social history, Allergies, and medications have been entered into the medical record, reviewed, and corrections made.   Review of Systems: No fevers, chills, night sweats, weight loss, chest pain, or shortness of breath.   Objective:    General: Speaking clearly in complete sentences without any shortness of breath.  Alert and oriented x3.  Normal judgment. No apparent acute distress.    Impression and Recommendations:    CKD (chronic kidney disease) stage 3, GFR 30-59 ml/min Due to recheck renal function following every 6 months.  And he is on diuretics  will check potassium as well.  Hyperlipidemia Currently on a statin due to recheck liver and lipid levels.  Tolerating statin well without any side effects or problems.  Hypogonadism in male Very happy with current testosterone regimen.  Due to recheck levels as well as PSA and CBC to monitor for side effects of the drug.  But he is feeling well on it.  COVID-19: Seems to have recovered fairly quickly.  He was vaccinated which is fantastic.  If he has any problems or residual symptoms then please let us know.  But again he feels like he is back to baseline.    Time spent in encounter 22 minutes  I discussed the assessment and treatment plan with the patient. The patient was provided an opportunity to ask questions and all were answered. The patient agreed with the plan and demonstrated an understanding of the instructions.   The patient was advised to call back or seek an in-person evaluation if the symptoms worsen or if the condition fails to improve as anticipated.   Beatrice Lecher, MD

## 2020-04-24 ENCOUNTER — Encounter: Payer: Self-pay | Admitting: Family Medicine

## 2020-04-24 ENCOUNTER — Telehealth (INDEPENDENT_AMBULATORY_CARE_PROVIDER_SITE_OTHER): Payer: Medicare HMO | Admitting: Family Medicine

## 2020-04-24 VITALS — BP 127/78 | HR 77 | Wt 163.0 lb

## 2020-04-24 DIAGNOSIS — U071 COVID-19: Secondary | ICD-10-CM | POA: Diagnosis not present

## 2020-04-24 DIAGNOSIS — R7989 Other specified abnormal findings of blood chemistry: Secondary | ICD-10-CM

## 2020-04-24 DIAGNOSIS — N1831 Chronic kidney disease, stage 3a: Secondary | ICD-10-CM

## 2020-04-24 DIAGNOSIS — E291 Testicular hypofunction: Secondary | ICD-10-CM

## 2020-04-24 DIAGNOSIS — I10 Essential (primary) hypertension: Secondary | ICD-10-CM | POA: Diagnosis not present

## 2020-04-24 DIAGNOSIS — E785 Hyperlipidemia, unspecified: Secondary | ICD-10-CM | POA: Diagnosis not present

## 2020-04-24 MED ORDER — ATORVASTATIN CALCIUM 40 MG PO TABS: 40.0000 mg | ORAL_TABLET | Freq: Every day | ORAL | 3 refills | Status: DC

## 2020-04-24 NOTE — Assessment & Plan Note (Signed)
Currently on a statin due to recheck liver and lipid levels.  Tolerating statin well without any side effects or problems.

## 2020-04-24 NOTE — Progress Notes (Signed)
Pt was tested on 9/10 and 9/11 was positive. He has been Audiological scientist.   No taking anything at this time. Prior to getting tested he was.   He wanted to know if there was anything that he need to do for COVID.  He asked about needing updated labs done.

## 2020-04-24 NOTE — Assessment & Plan Note (Signed)
Due to recheck renal function following every 6 months.  And he is on diuretics will check potassium as well.

## 2020-04-24 NOTE — Assessment & Plan Note (Signed)
Very happy with current testosterone regimen.  Due to recheck levels as well as PSA and CBC to monitor for side effects of the drug.  But he is feeling well on it.

## 2020-04-28 ENCOUNTER — Other Ambulatory Visit: Payer: Self-pay | Admitting: Family Medicine

## 2020-05-01 DIAGNOSIS — N1831 Chronic kidney disease, stage 3a: Secondary | ICD-10-CM | POA: Diagnosis not present

## 2020-05-01 DIAGNOSIS — R7989 Other specified abnormal findings of blood chemistry: Secondary | ICD-10-CM | POA: Diagnosis not present

## 2020-05-01 DIAGNOSIS — Z125 Encounter for screening for malignant neoplasm of prostate: Secondary | ICD-10-CM | POA: Diagnosis not present

## 2020-05-01 DIAGNOSIS — E785 Hyperlipidemia, unspecified: Secondary | ICD-10-CM | POA: Diagnosis not present

## 2020-05-02 LAB — COMPLETE METABOLIC PANEL WITH GFR
AG Ratio: 1.3 (calc) (ref 1.0–2.5)
ALT: 16 U/L (ref 9–46)
AST: 16 U/L (ref 10–35)
Albumin: 3.9 g/dL (ref 3.6–5.1)
Alkaline phosphatase (APISO): 85 U/L (ref 35–144)
BUN/Creatinine Ratio: 20 (calc) (ref 6–22)
BUN: 30 mg/dL — ABNORMAL HIGH (ref 7–25)
CO2: 29 mmol/L (ref 20–32)
Calcium: 9.7 mg/dL (ref 8.6–10.3)
Chloride: 101 mmol/L (ref 98–110)
Creat: 1.49 mg/dL — ABNORMAL HIGH (ref 0.70–1.25)
GFR, Est African American: 55 mL/min/{1.73_m2} — ABNORMAL LOW (ref >=60)
GFR, Est Non African American: 48 mL/min/{1.73_m2} — ABNORMAL LOW (ref >=60)
Globulin: 2.9 g/dL (calc) (ref 1.9–3.7)
Glucose, Bld: 102 mg/dL (ref 65–139)
Potassium: 4.2 mmol/L (ref 3.5–5.3)
Sodium: 138 mmol/L (ref 135–146)
Total Bilirubin: 0.5 mg/dL (ref 0.2–1.2)
Total Protein: 6.8 g/dL (ref 6.1–8.1)

## 2020-05-02 LAB — LIPID PANEL W/REFLEX DIRECT LDL
Cholesterol: 129 mg/dL (ref <200)
HDL: 56 mg/dL (ref >=40)
LDL Cholesterol (Calc): 51 mg/dL (calc)
Non-HDL Cholesterol (Calc): 73 mg/dL (calc) (ref <130)
Total CHOL/HDL Ratio: 2.3 (calc) (ref <5.0)
Triglycerides: 134 mg/dL (ref <150)

## 2020-05-02 LAB — CBC
HCT: 45 % (ref 38.5–50.0)
Hemoglobin: 15 g/dL (ref 13.2–17.1)
MCH: 30.9 pg (ref 27.0–33.0)
MCHC: 33.3 g/dL (ref 32.0–36.0)
MCV: 92.8 fL (ref 80.0–100.0)
MPV: 10.2 fL (ref 7.5–12.5)
Platelets: 212 10*3/uL (ref 140–400)
RBC: 4.85 10*6/uL (ref 4.20–5.80)
RDW: 12.9 % (ref 11.0–15.0)
WBC: 6.6 10*3/uL (ref 3.8–10.8)

## 2020-05-02 LAB — TESTOSTERONE TOTAL,FREE,BIO, MALES
Albumin: 3.9 g/dL (ref 3.6–5.1)
Sex Hormone Binding: 22 nmol/L (ref 22–77)
Testosterone, Bioavailable: 291.2 ng/dL (ref >=110.0)
Testosterone, Free: 162.2 pg/mL (ref 46.0–224.0)
Testosterone: 748 ng/dL (ref 250–827)

## 2020-05-02 LAB — PSA: PSA: 2.61 ng/mL (ref <=4.0)

## 2020-05-11 ENCOUNTER — Other Ambulatory Visit: Payer: Self-pay | Admitting: Family Medicine

## 2020-05-11 DIAGNOSIS — E785 Hyperlipidemia, unspecified: Secondary | ICD-10-CM

## 2020-05-11 DIAGNOSIS — I10 Essential (primary) hypertension: Secondary | ICD-10-CM

## 2020-06-01 ENCOUNTER — Other Ambulatory Visit: Payer: Self-pay | Admitting: Family Medicine

## 2020-06-01 DIAGNOSIS — N529 Male erectile dysfunction, unspecified: Secondary | ICD-10-CM

## 2020-06-02 ENCOUNTER — Other Ambulatory Visit: Payer: Self-pay

## 2020-06-02 MED ORDER — BENZONATATE 200 MG PO CAPS: 200.0000 mg | ORAL_CAPSULE | Freq: Every evening | ORAL | 2 refills | Status: DC | PRN

## 2020-06-09 ENCOUNTER — Other Ambulatory Visit: Payer: Self-pay | Admitting: Family Medicine

## 2020-06-09 DIAGNOSIS — N529 Male erectile dysfunction, unspecified: Secondary | ICD-10-CM

## 2020-06-13 ENCOUNTER — Other Ambulatory Visit: Payer: Self-pay | Admitting: Family Medicine

## 2020-06-13 DIAGNOSIS — N529 Male erectile dysfunction, unspecified: Secondary | ICD-10-CM

## 2020-08-09 ENCOUNTER — Other Ambulatory Visit: Payer: Self-pay | Admitting: Family Medicine

## 2020-08-09 DIAGNOSIS — E785 Hyperlipidemia, unspecified: Secondary | ICD-10-CM

## 2020-08-09 DIAGNOSIS — B351 Tinea unguium: Secondary | ICD-10-CM

## 2020-08-09 DIAGNOSIS — I10 Essential (primary) hypertension: Secondary | ICD-10-CM

## 2020-08-09 DIAGNOSIS — N529 Male erectile dysfunction, unspecified: Secondary | ICD-10-CM

## 2020-08-10 ENCOUNTER — Other Ambulatory Visit: Payer: Self-pay | Admitting: Family Medicine

## 2020-08-10 DIAGNOSIS — R351 Nocturia: Secondary | ICD-10-CM

## 2020-08-18 ENCOUNTER — Telehealth: Payer: Self-pay | Admitting: Neurology

## 2020-08-18 ENCOUNTER — Other Ambulatory Visit: Payer: Self-pay | Admitting: Family Medicine

## 2020-08-18 DIAGNOSIS — I10 Essential (primary) hypertension: Secondary | ICD-10-CM

## 2020-08-18 DIAGNOSIS — E785 Hyperlipidemia, unspecified: Secondary | ICD-10-CM

## 2020-08-18 NOTE — Telephone Encounter (Signed)
Prior Authorization for Testosterone gel submitted via covermymeds. Awaiting response.  Your information has been submitted to [place]. [employer] [place] will review the request and notify you of the determination decision directly, typically within 72 hours of receiving all information.  You will also receive your request decision electronically. To check for an update later, open this request again from your dashboard.  If [employer] has not responded within the specified timeframe or if you have any questions about your PA submission, contact [facility]  directly at [phone].

## 2020-08-22 ENCOUNTER — Other Ambulatory Visit: Payer: Self-pay | Admitting: Family Medicine

## 2020-08-22 ENCOUNTER — Other Ambulatory Visit: Payer: Self-pay

## 2020-08-22 NOTE — Telephone Encounter (Signed)
[facility] called requesting a med refill for testosterone gel. Rx pended.

## 2020-08-22 NOTE — Telephone Encounter (Signed)
PA approval for testosterone gel from 08/09/20-08/08/21. [facility] has been updated.

## 2020-08-26 MED ORDER — TESTOSTERONE 10 MG/ACT (2%) TD GEL
TRANSDERMAL | 2 refills | Status: DC
Start: 2020-08-26 — End: 2021-11-16

## 2020-09-10 ENCOUNTER — Other Ambulatory Visit: Payer: Self-pay

## 2020-09-10 ENCOUNTER — Ambulatory Visit (INDEPENDENT_AMBULATORY_CARE_PROVIDER_SITE_OTHER): Payer: Medicare HMO | Admitting: Family Medicine

## 2020-09-10 ENCOUNTER — Encounter: Payer: Self-pay | Admitting: Family Medicine

## 2020-09-10 VITALS — BP 132/70 | HR 67 | Ht 72.0 in | Wt 172.0 lb

## 2020-09-10 DIAGNOSIS — N1831 Chronic kidney disease, stage 3a: Secondary | ICD-10-CM | POA: Diagnosis not present

## 2020-09-10 DIAGNOSIS — I10 Essential (primary) hypertension: Secondary | ICD-10-CM | POA: Diagnosis not present

## 2020-09-10 DIAGNOSIS — J470 Bronchiectasis with acute lower respiratory infection: Secondary | ICD-10-CM | POA: Diagnosis not present

## 2020-09-10 DIAGNOSIS — Z96642 Presence of left artificial hip joint: Secondary | ICD-10-CM | POA: Diagnosis not present

## 2020-09-10 NOTE — Assessment & Plan Note (Signed)
Discussed possibly getting an updated spirometry test when he is able to the spring.  Consider also getting some updated imaging around that time as well such as CT of the chest.

## 2020-09-10 NOTE — Patient Instructions (Signed)
Your EKG is up-to-date.  You had one in January of last year.  If at any point you are having difficulty with your breathing or chest discomfort with exercise then please let us know immediately.

## 2020-09-10 NOTE — Progress Notes (Signed)
Established Patient Office Visit  Subjective:  Patient ID: Victor Price, male    DOB: 12/06/1950  Age: 70 y.o. MRN: [id number]  CC:  Chief Complaint  Patient presents with  . Follow-up    Pt has a form to start/join a fitness class. 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  It is a trainer that his wife has been working with for several years now he plans on starting out with once a week workout he wants to do specifically resistance training cardio and high intensity training.  He does have hypertension and he takes medication for up his blood pressures well controlled.  No recent chest pain or shortness of breath.  He does have a history of recurrent pneumonia but says his breathing has not caused him to limit his activities he says he really never feels short of breath unless he is sick.  Status post left hip replacement.  He is also interested in starting oral creatine to help with his workout.  He does have CKD 3.  He does have a history of underlying bronchiectasis.  It looks like he is not had a chest CT in the last decade.  And is been several years since he had spirometry.  Have had to use prednisone intermittently when he has become sick and he says he always responds really well to it and wanted to discuss that today.  Past Medical History:  Diagnosis Date  . BPH (benign prostatic hyperplasia)   . Bronchiectasis with acute lower respiratory infection ([place]) 08/13/2019  . Bronchitis    inhaler prn  . ED (erectile dysfunction)   . GERD (gastroesophageal reflux disease)   . HLD (hyperlipidemia)   . Hypertension   . Pneumonia    gets recurrent PNA in left lung, recent dx 08/13/19 left lower lobe  . Seasonal allergies     Past Surgical History:  Procedure Laterality Date  . BACK SURGERY  1980s   [facility]  . COLONOSCOPY  2004, 2012   several - polyps  . MULTIPLE  TOOTH EXTRACTIONS    . TOTAL HIP ARTHROPLASTY Left 08/24/2019   Procedure: LEFT TOTAL HIP ARTHROPLASTY-DIRECT ANTERIOR;  Surgeon: Marybelle Killings, MD;  Location: [facility];  Service: Orthopedics;  Laterality: Left;    History reviewed. No pertinent family history.  Social History   Socioeconomic History  . Marital status: Single    Spouse name: Not on file  . Number of children: Not on file  . Years of education: Not on file  . Highest education level: Not on file  Occupational History  . Occupation: [employer]    Employer: [employer]  Tobacco Use  . Smoking status: Never Smoker  . Smokeless tobacco: Never Used  Vaping Use  . Vaping Use: Never used  Substance and Sexual Activity  . Alcohol use: No  . Drug use: No  . Sexual activity: Yes  Other Topics Concern  . Not on file  Social History Narrative  . Not on file   Social Determinants of Health   Financial Resource Strain: Not on file  Food Insecurity: Not on file  Transportation Needs: Not on file  Physical Activity: Not on file  Stress: Not on file  Social Connections: Not on file  Intimate Partner Violence: Not on file    Outpatient Medications Prior to Visit  Medication Sig Dispense  Refill  . albuterol (VENTOLIN HFA) 108 (90 Base) MCG/ACT inhaler Inhale 2 puffs into the lungs every 6 (six) hours as needed for wheezing or shortness of breath. 18 g prn  . atorvastatin (LIPITOR) 40 MG tablet Take 1 tablet (40 mg total) by mouth at bedtime. 90 tablet 3  . benzonatate (TESSALON) 200 MG capsule Take 1 capsule (200 mg total) by mouth at bedtime as needed for cough. 30 capsule 2  . bisoprolol-hydrochlorothiazide (ZIAC) 10-6.25 MG tablet TAKE 1 TABLET BY MOUTH EVERY DAY 90 tablet 0  . [facility] ALLERGY RELIEF-D 10-240 MG 24 hr tablet TAKE 1 TABLET BY MOUTH EVERY DAY 30 tablet 11  . Lidocaine-Hydrocortisone Ace (ANAMANTLE HC) 3-0.5 % KIT Apply 1 dose rectally up to  Bid prn for spasm. 1 kit PRN  . pantoprazole (PROTONIX) 40 MG  tablet Take 1 tablet (40 mg total) by mouth daily. 90 tablet 3  . sildenafil (REVATIO) 20 MG tablet TAKE 2-5 TABLETS (40-100 MG TOTAL) BY MOUTH DAILY AS NEEDED. 40 tablet 5  . tamsulosin (FLOMAX) 0.4 MG CAPS capsule TAKE 1 CAPSULE (0.4 MG TOTAL) BY MOUTH DAILY AFTER SUPPER. 90 capsule 1  . terbinafine (LAMISIL) 250 MG tablet TAKE 1 TABLET BY MOUTH EVERY DAY 90 tablet 1  . Testosterone 10 MG/ACT (2%) GEL APPLY 1 PUMP TO AN INNER THIGH AND 2 PUMPS TO OPPOSITE INNER THIGH IN AM. FORTESTA. 120 g 2  . traZODone (DESYREL) 50 MG tablet TAKE 0.5-2 TABLETS (25-100 MG TOTAL) BY MOUTH AT BEDTIME AS NEEDED FOR SLEEP. 180 tablet 3  . methocarbamol (ROBAXIN) 500 MG tablet TAKE 1 TABLET (500 MG TOTAL) BY MOUTH EVERY 6 (SIX) HOURS AS NEEDED FOR MUSCLE SPASMS. 30 tablet 0   No facility-administered medications prior to visit.    No Known Allergies  ROS Review of Systems    Objective:    Physical Exam Constitutional:      Appearance: He is well-developed and well-nourished.  HENT:     Head: Normocephalic and atraumatic.  Cardiovascular:     Rate and Rhythm: Normal rate and regular rhythm.     Heart sounds: Normal heart sounds.  Pulmonary:     Effort: Pulmonary effort is normal.     Breath sounds: Normal breath sounds.  Skin:    General: Skin is warm and dry.  Neurological:     Mental Status: He is alert and oriented to person, place, and time.  Psychiatric:        Mood and Affect: Mood and affect normal.        Behavior: Behavior normal.     BP 132/70   Pulse 67   Ht 6' (1.829 m)   Wt 172 lb (78 kg)   SpO2 100%   BMI 23.33 kg/m  Wt Readings from Last 3 Encounters:  09/10/20 172 lb (78 kg)  04/24/20 163 lb (73.9 kg)  04/09/20 165 lb (74.8 kg)     Health Maintenance Due  Topic Date Due  . Hepatitis C Screening  Never done  . COVID-19 Vaccine (3 - Booster for Pfizer series) 04/23/2020    There are no preventive care reminders to display for this patient.  Lab Results   Component Value Date   TSH 0.11 (L) 10/20/2017   Lab Results  Component Value Date   WBC 6.6 05/01/2020   HGB 15.0 05/01/2020   HCT 45.0 05/01/2020   MCV 92.8 05/01/2020   PLT 212 05/01/2020   Lab Results  Component Value Date  NA 138 05/01/2020   K 4.2 05/01/2020   CO2 29 05/01/2020   GLUCOSE 102 05/01/2020   BUN 30 (H) 05/01/2020   CREATININE 1.49 (H) 05/01/2020   BILITOT 0.5 05/01/2020   ALKPHOS 75 08/15/2019   AST 16 05/01/2020   ALT 16 05/01/2020   PROT 6.8 05/01/2020   ALBUMIN 3.8 08/15/2019   CALCIUM 9.7 05/01/2020   ANIONGAP 8 08/15/2019   Lab Results  Component Value Date   CHOL 129 05/01/2020   Lab Results  Component Value Date   HDL 56 05/01/2020   Lab Results  Component Value Date   LDLCALC 51 05/01/2020   Lab Results  Component Value Date   TRIG 134 05/01/2020   Lab Results  Component Value Date   CHOLHDL 2.3 05/01/2020   No results found for: HGBA1C    Assessment & Plan:   Problem List Items Addressed This Visit      Cardiovascular and Mediastinum   HYPERTENSION, BENIGN SYSTEMIC - Primary    Well controlled. Continue current regimen. Follow up in  6 mo due for BMP      Relevant Orders   BASIC METABOLIC PANEL WITH GFR     Respiratory   Bronchiectasis ([place])    Discussed possibly getting an updated spirometry test when he is able to the spring.  Consider also getting some updated imaging around that time as well such as CT of the chest.        Genitourinary   CKD (chronic kidney disease) stage 3, GFR 30-59 ml/min ([facility])    To recheck renal function.  Okay to use creatine for workout but will need to monitor kidney function.  Do not take more than the bottle recommends.        Other   H/O total hip arthroplasty, left      Form completed so that he can participate/work out with his Physiological scientist.  Okay for full activity though did want to make them aware that he does have a left hip replacement.  No orders of the defined  types were placed in this encounter.   Follow-up: Return in about 6 months (around 03/10/2021) for Hypertension.    Beatrice Lecher, MD

## 2020-09-10 NOTE — Assessment & Plan Note (Signed)
To recheck renal function.  Okay to use creatine for workout but will need to monitor kidney function.  Do not take more than the bottle recommends.

## 2020-09-10 NOTE — Assessment & Plan Note (Signed)
Well controlled. Continue current regimen. Follow up in  6 mo due for BMP

## 2020-09-11 ENCOUNTER — Other Ambulatory Visit: Payer: Self-pay

## 2020-09-11 MED ORDER — MUPIROCIN 2 % EX OINT: TOPICAL_OINTMENT | Freq: Three times a day (TID) | CUTANEOUS | 99 refills | Status: DC

## 2020-09-11 NOTE — Telephone Encounter (Signed)
Victor Price states he would like a refill on mupirocin ointment. Not on current medication list and last filled 2019. Pended prescription.

## 2020-09-22 ENCOUNTER — Telehealth (INDEPENDENT_AMBULATORY_CARE_PROVIDER_SITE_OTHER): Payer: Medicare HMO | Admitting: Family Medicine

## 2020-09-22 ENCOUNTER — Encounter: Payer: Self-pay | Admitting: Family Medicine

## 2020-09-22 DIAGNOSIS — R42 Dizziness and giddiness: Secondary | ICD-10-CM | POA: Diagnosis not present

## 2020-09-22 DIAGNOSIS — Z8616 Personal history of COVID-19: Secondary | ICD-10-CM

## 2020-09-22 MED ORDER — MECLIZINE HCL 25 MG PO TABS
25.0000 mg | ORAL_TABLET | Freq: Three times a day (TID) | ORAL | 0 refills | Status: DC | PRN
Start: 2020-09-22 — End: 2021-02-20

## 2020-09-22 NOTE — Progress Notes (Signed)
Virtual Visit via Telephone Note  I connected with Victor Price on 09/22/20 at 11:30 AM EST by telephone and verified that I am speaking with the correct person using two identifiers.   I discussed the limitations, risks, security and privacy concerns of performing an evaluation and management service by telephone and the availability of in person appointments. I also discussed with the patient that there may be a patient responsible charge related to this service. The patient expressed understanding and agreed to proceed.  Patient location: at home  Provider loccation: In office   Subjective:    CC: Vertigo  HPI:  Pt reports that since having COVID he has been experiencing this and forgot to mention this at his last OV. He has tried taking Dramamine but stated that this only causes him to feel worse.    He stated that his most recent episode was today.   I asked him if he gets dizzy if he goes from lying to sitting or turning his head he stated that he does. Feels like he is walking to the right when it hits. Some days he feels fine. Then gets nauseated with the sensation.  No syncope. No vomiting.  Friday and today have been the worst days. Had to leave work today.     Past medical history, Surgical history, Family history not pertinant except as noted below, Social history, Allergies, and medications have been entered into the medical record, reviewed, and corrections made.   Review of Systems: No fevers, chills, night sweats, weight loss, chest pain, or shortness of breath.   Objective:    General: Speaking clearly in complete sentences without any shortness of breath.  Alert and oriented x3.  Normal judgment. No apparent acute distress.    Impression and Recommendations:    Vertigo -suspect BPPV based on his description though without an exam it is difficult to help confirm that diagnosis.  We did discuss a trial of meclizine for symptom control but explained that this  will not actually treat the vertigo and he would need to do some type of therapy either at home or vestibular rehab.  He said he is interested in getting rid of this as quickly as possible so we will move forward with vestibular rehab.    I discussed the assessment and treatment plan with the patient. The patient was provided an opportunity to ask questions and all were answered. The patient agreed with the plan and demonstrated an understanding of the instructions.   The patient was advised to call back or seek an in-person evaluation if the symptoms worsen or if the condition fails to improve as anticipated.  I provided 21 minutes of non-face-to-face time during this encounter.   Beatrice Lecher, MD

## 2020-09-22 NOTE — Progress Notes (Signed)
Pt reports that since having COVID he has been experiencing this and forgot to mention this at his last OV. He has tried taking Dramamine but stated that this only causes him to feel worse.    He stated that his most recent episode was today.   I asked him if he gets dizzy if he goes from lying to sitting or turning his head he stated that he does. I told him that vertigo is better evaluated as an in person visit and since he has never been seen for this before this is going to be difficult and that it may require an in person visit

## 2020-09-30 ENCOUNTER — Other Ambulatory Visit: Payer: Self-pay

## 2020-09-30 ENCOUNTER — Ambulatory Visit (INDEPENDENT_AMBULATORY_CARE_PROVIDER_SITE_OTHER): Payer: Medicare HMO | Admitting: Rehabilitative and Restorative Service Providers"

## 2020-09-30 DIAGNOSIS — R42 Dizziness and giddiness: Secondary | ICD-10-CM

## 2020-09-30 DIAGNOSIS — R2681 Unsteadiness on feet: Secondary | ICD-10-CM | POA: Diagnosis not present

## 2020-09-30 NOTE — Therapy (Addendum)
[place], [place], [address] Phone: [phone]   Fax:  [phone]  Physical Therapy Evaluation and Discharge  Patient Details  Name: Victor Price MRN: [id number] Date of Birth: 01-14-1951 Referring Provider (PT): Beatrice Lecher, MD   Patient did not return to PT.  Please refer to initial evaluation for patient status. Thank you for the referral of this patient. Rudell Cobb, MPT   Encounter Date: 09/30/2020   PT End of Session - 09/30/20 1141    Visit Number 1    Number of Visits 6    Date for PT Re-Evaluation 11/11/20    Authorization Type aetna medicare    PT Start Time 1109    PT Stop Time 1145    PT Time Calculation (min) 36 min           Past Medical History:  Diagnosis Date  . BPH (benign prostatic hyperplasia)   . Bronchiectasis with acute lower respiratory infection ([place]) 08/13/2019  . Bronchitis    inhaler prn  . ED (erectile dysfunction)   . GERD (gastroesophageal reflux disease)   . HLD (hyperlipidemia)   . Hypertension   . Pneumonia    gets recurrent PNA in left lung, recent dx 08/13/19 left lower lobe  . Seasonal allergies     Past Surgical History:  Procedure Laterality Date  . BACK SURGERY  1980s   [facility]  . COLONOSCOPY  2004, 2012   several - polyps  . MULTIPLE TOOTH EXTRACTIONS    . TOTAL HIP ARTHROPLASTY Left 08/24/2019   Procedure: LEFT TOTAL HIP ARTHROPLASTY-DIRECT ANTERIOR;  Surgeon: Marybelle Killings, MD;  Location: [facility];  Service: Orthopedics;  Laterality: Left;    There were no vitals filed for this visit.    Subjective Assessment - 09/30/20 1111    Subjective The patient had a sudden onset of sensation of verigo 3 weeks ago described as "just dizzy", negative sensation of room spinning,  feeling like he wants to lean right with walking, episodes of tunnel vision 2days/week @ 9am.  These sensations last 2 hours up until after lunch.  He notes mild  nausea, imbalance.  He denies double vision, but reports "no vision" noting "I can't read anything due to blurriness for an hour or two.".  He denies hearing changes.    Pertinent History very distant h/o migraines, Covid illnes recent    Patient Stated Goals reduce episodes of dizziness    Currently in Pain? No/denies              [facility] PT Assessment - 09/30/20 1117      Assessment   Medical Diagnosis vertigo    Referring Provider (PT) Beatrice Lecher, MD    Onset Date/Surgical Date --   3 weeks ago     Precautions   Precautions None      Restrictions   Weight Bearing Restrictions No      Balance Screen   Has the patient fallen in the past 6 months No    Has the patient had a decrease in activity level because of a fear of falling?  No    Is the patient reluctant to leave their home because of a fear of falling?  No      Home Environment   Living Environment Private residence      Prior Function   Level of Independence Independent      High Level Balance   High Level Balance Comments foam  with eyes open x 30 seconds, eyes closed x 30 seconds with increased sway                  Vestibular Assessment - 09/30/20 1118      Vestibular Assessment   General Observation walks indep into clinic      Symptom Behavior   Type of Dizziness  Imbalance;Blurred vision   "no vision" loses peripheral vision and describes a "tunneling"   Frequency of Dizziness 2x/week    Duration of Dizziness hours    Symptom Nature Spontaneous;Variable;Intermittent    Aggravating Factors No known aggravating factors    Relieving Factors No known relieving factors    History of similar episodes does endorse a very distant h/o migraines with visual aura      Oculomotor Exam   Oculomotor Alignment Normal    Ocular ROM WFL    Spontaneous Absent    Gaze-induced  Absent    Smooth Pursuits Intact    Saccades Intact      Vestibulo-Ocular Reflex   VOR 1 Head Only (x 1 viewing) slow VOR  x 1 viewing no dizziness and able to maintain fixation to target    Comment head impulse test=+ for refixation saccade to the right      Positional Testing   Dix-Hallpike Dix-Hallpike Right;Dix-Hallpike Left    Sidelying Test Sidelying Right;Sidelying Left    Horizontal Canal Testing Horizontal Canal Left;Horizontal Canal Right      Dix-Hallpike Right   Dix-Hallpike Right Duration none    Dix-Hallpike Right Symptoms No nystagmus      Dix-Hallpike Left   Dix-Hallpike Left Duration none    Dix-Hallpike Left Symptoms No nystagmus      Sidelying Right   Sidelying Right Duration none    Sidelying Right Symptoms No nystagmus      Sidelying Left   Sidelying Left Duration none    Sidelying Left Symptoms No nystagmus      Horizontal Canal Right   Horizontal Canal Right Duration none    Horizontal Canal Right Symptoms Normal      Horizontal Canal Left   Horizontal Canal Left Duration none    Horizontal Canal Left Symptoms Normal      Positional Sensitivities   Positional Sensitivities Comments Patient reports h/o motion sickness.  He gets a general "sick" feeling with changing oil or laying flat that appears to be since childhood.  He has mild increase in nausea with dix hallpike and horiozntal roll testing.  He does not feel this way with sidelying tests.              Objective measurements completed on examination: See above findings.       [facility] Adult PT Treatment/Exercise - 09/30/20 1221      Neuro Re-ed    Neuro Re-ed Details  Corner standing on foam pad with feet together and eyes closed x 30 second intervals           Vestibular Treatment/Exercise - 09/30/20 1123      Vestibular Treatment/Exercise   Vestibular Treatment Provided Gaze    Habituation Exercises Nestor Lewandowsky    Gaze Exercises X1 Viewing Horizontal      Nestor Lewandowsky   Number of Reps  2    Symptom Description  no sensation of nausea with brandt daroff.      X1 Viewing Horizontal   Foot  Position seated and standing    Comments 30 seconds with increasing speed of head motion  PT Education - 09/30/20 1140    Education Details HEP    Person(s) Educated Patient    Methods Explanation;Demonstration;Handout    Comprehension Returned demonstration;Verbalized understanding               PT Long Term Goals - 09/30/20 1208      PT LONG TERM GOAL #1   Title The patient will be indep with gaze adaptation and multi-sensory balance HEP.    Time 6    Period Weeks    Target Date 11/11/20      PT LONG TERM GOAL #2   Title The patient will tolerate gaze x 1 without "sick" feeling x 60 seconds.    Time 6    Period Weeks    Target Date 11/11/20      PT LONG TERM GOAL #3   Title The patient will subjectively report dec'd sesnation of imbalance with walking.    Time 6    Period Weeks    Target Date 11/11/20                  Plan - 09/30/20 1129    Clinical Impression Statement The patient is a 70 yo  male presenting to OP physical therapy with intermittent dizziness and visual changes that last hours.  He is experiencing these episodes approximately 2 days/week.  He presents today with negative positional testing, positive head impulse test to the right side indicating possible R vestibular hypofunction, and multi-sensory balance loss.  He does have h/o peripheral neuropathy and leans to R side with foam standing + eyes closed (also supporting a R vestibular hypofunction).  PT initiated HEP for gaze adaptation and compliant standing activities for home.  He has report of a visual change that accompanies these episodes.  He describes a blurry/hazy central vision x hours in which he cannot read the computer screen.  With further questions, the patient describes childhood visual aura without migraine with onset 39-33 years old and a h/o complex migraines with R visual loss + R sided weakness and severe HA (2 episodes in his lifetime, one 30 years ago and  one 20 years ago).  He has not had recent migraines.  PT educated patient that vestibular exercises will not impact that symptom and if it continues or worsens, he should speak with his physician.    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   Stability/Clinical Decision Making Stable/Uncomplicated    Clinical Decision Making Low    Rehab Potential Good    PT Frequency 1x / week    PT Duration 6 weeks    PT Treatment/Interventions ADLs/Self Care Home Management;Patient/family education;Neuromuscular re-education;Balance training;Canalith Repostioning;Vestibular;Gait training;Therapeutic activities    PT Next Visit Plan progress gaze adaptation, further develop habituation activities as needed, dynamic gait with head motion and quick turns    PT Home Exercise Plan [id number]    Consulted and Agree with Plan of Care Patient           Patient will benefit from skilled therapeutic intervention in order to improve the following deficits and impairments:  Dizziness,Impaired vision/preception,Decreased balance  Visit Diagnosis: Dizziness and giddiness  Unsteadiness on feet     Problem List Patient Active Problem List   Diagnosis Date Noted  . Carpal tunnel syndrome, left upper limb  12/04/2019  . H/O total hip arthroplasty, left 10/19/2019  . DDD (degenerative disc disease), lumbar 06/05/2019  . Subclinical hyperthyroidism 10/31/2017  . Hypogonadism in male 10/31/2017  . CKD (chronic kidney disease) stage 3, GFR 30-59 ml/min (HCC) 02/08/2017  . Bronchiectasis ([place]) 07/20/2016  . Radiculitis of right cervical region 08/28/2014  . DDD (degenerative disc disease), cervical 08/28/2014  . Insomnia 04/30/2014  . Peripheral neuropathy 04/02/2013   . Herniated disc 01/29/2013  . BPH (benign prostatic hyperplasia) 12/14/2011  . ERECTILE DYSFUNCTION, ORGANIC 07/23/2010  . THYROID NODULE, LEFT 05/23/2007  . Hyperlipidemia 05/17/2006  . HYPERTENSION, BENIGN SYSTEMIC 05/17/2006    Tarius Stangelo, PT 09/30/2020, 12:23 PM  [facility] [place], [place], [address] Phone: [phone]   Fax:  [phone]  Name: Victor Price MRN: [id number] Date of Birth: Jul 10, 1951

## 2020-09-30 NOTE — Patient Instructions (Signed)
Access Code: [facility] URL: [email or web address] Date: 09/30/2020 Prepared by: Rudell Cobb  Exercises Standing Gaze Stabilization with Head Rotation - 2 x daily - 7 x weekly - 1 sets - 1 reps - 60 seconds hold Romberg Stance Eyes Closed on Foam Pad - 2 x daily - 7 x weekly - 1 sets - 3 reps - 30 seconds hold

## 2020-10-08 DIAGNOSIS — L570 Actinic keratosis: Secondary | ICD-10-CM | POA: Diagnosis not present

## 2020-10-08 DIAGNOSIS — C44519 Basal cell carcinoma of skin of other part of trunk: Secondary | ICD-10-CM | POA: Diagnosis not present

## 2020-10-08 DIAGNOSIS — L821 Other seborrheic keratosis: Secondary | ICD-10-CM | POA: Diagnosis not present

## 2020-10-08 DIAGNOSIS — L700 Acne vulgaris: Secondary | ICD-10-CM | POA: Diagnosis not present

## 2020-10-10 ENCOUNTER — Encounter: Payer: Medicare HMO | Admitting: Rehabilitative and Restorative Service Providers"

## 2020-10-14 DIAGNOSIS — I10 Essential (primary) hypertension: Secondary | ICD-10-CM | POA: Diagnosis not present

## 2020-10-15 LAB — BASIC METABOLIC PANEL WITH GFR
BUN/Creatinine Ratio: 13 (calc) (ref 6–22)
BUN: 18 mg/dL (ref 7–25)
CO2: 30 mmol/L (ref 20–32)
Calcium: 9.6 mg/dL (ref 8.6–10.3)
Chloride: 102 mmol/L (ref 98–110)
Creat: 1.41 mg/dL — ABNORMAL HIGH (ref 0.70–1.25)
GFR, Est African American: 59 mL/min/{1.73_m2} — ABNORMAL LOW (ref >=60)
GFR, Est Non African American: 51 mL/min/{1.73_m2} — ABNORMAL LOW (ref >=60)
Glucose, Bld: 100 mg/dL — ABNORMAL HIGH (ref 65–99)
Potassium: 4.1 mmol/L (ref 3.5–5.3)
Sodium: 140 mmol/L (ref 135–146)

## 2020-10-17 ENCOUNTER — Other Ambulatory Visit: Payer: Self-pay | Admitting: Family Medicine

## 2020-10-22 ENCOUNTER — Other Ambulatory Visit: Payer: Self-pay | Admitting: Family Medicine

## 2020-10-23 ENCOUNTER — Other Ambulatory Visit: Payer: Self-pay | Admitting: Family Medicine

## 2020-10-23 DIAGNOSIS — I10 Essential (primary) hypertension: Secondary | ICD-10-CM

## 2020-10-23 DIAGNOSIS — E785 Hyperlipidemia, unspecified: Secondary | ICD-10-CM

## 2020-10-30 DIAGNOSIS — G4700 Insomnia, unspecified: Secondary | ICD-10-CM | POA: Diagnosis not present

## 2020-10-30 DIAGNOSIS — K219 Gastro-esophageal reflux disease without esophagitis: Secondary | ICD-10-CM | POA: Diagnosis not present

## 2020-10-30 DIAGNOSIS — N400 Enlarged prostate without lower urinary tract symptoms: Secondary | ICD-10-CM | POA: Diagnosis not present

## 2020-10-30 DIAGNOSIS — J40 Bronchitis, not specified as acute or chronic: Secondary | ICD-10-CM | POA: Diagnosis not present

## 2020-10-30 DIAGNOSIS — I10 Essential (primary) hypertension: Secondary | ICD-10-CM | POA: Diagnosis not present

## 2020-10-30 DIAGNOSIS — N529 Male erectile dysfunction, unspecified: Secondary | ICD-10-CM | POA: Diagnosis not present

## 2020-10-30 DIAGNOSIS — B379 Candidiasis, unspecified: Secondary | ICD-10-CM | POA: Diagnosis not present

## 2020-10-30 DIAGNOSIS — J302 Other seasonal allergic rhinitis: Secondary | ICD-10-CM | POA: Diagnosis not present

## 2020-10-30 DIAGNOSIS — E291 Testicular hypofunction: Secondary | ICD-10-CM | POA: Diagnosis not present

## 2020-10-30 DIAGNOSIS — E785 Hyperlipidemia, unspecified: Secondary | ICD-10-CM | POA: Diagnosis not present

## 2020-12-01 ENCOUNTER — Ambulatory Visit (INDEPENDENT_AMBULATORY_CARE_PROVIDER_SITE_OTHER): Payer: Medicare HMO | Admitting: Family Medicine

## 2020-12-01 DIAGNOSIS — Z0000 Encounter for general adult medical examination without abnormal findings: Secondary | ICD-10-CM | POA: Diagnosis not present

## 2020-12-01 NOTE — Patient Instructions (Addendum)
[place] Maintenance Summary and Written Plan of Care  [name] ,  Thank you for allowing me to perform your Medicare Annual Wellness Visit and for your ongoing commitment to your health.   Health Maintenance & Immunization History Health Maintenance  Topic Date Due  . COVID-19 Vaccine (3 - Booster for Pfizer series) 12/17/2020 (Originally 04/23/2020)  . PNA vac Low Risk Adult (1 of 2 - PCV13) 04/24/2021 (Originally 10/12/2017)  . Hepatitis C Screening  12/01/2021 (Originally 04-24-1951)  . INFLUENZA VACCINE  03/09/2021  . COLONOSCOPY (Pts 45-32yrs Insurance coverage will need to be confirmed)  06/23/2021  . TETANUS/TDAP  11/01/2021  . HPV VACCINES  Aged Out   Immunization History  Administered Date(s) Administered  . Fluad Quad(high Dose 65+) 04/09/2020  . Influenza Split 05/17/2012  . Influenza Whole 05/10/2005, 04/22/2011  . Influenza, High Dose Seasonal PF 03/28/2019  . Influenza,inj,Quad PF,6+ Mos 04/02/2013, 05/28/2015  . Influenza-Unspecified 04/22/2016, 04/30/2017, 05/16/2018  . PFIZER(Purple Top)SARS-COV-2 Vaccination 10/01/2019, 10/22/2019  . Pneumococcal Polysaccharide-23 05/10/2005  . Td 08/11/1998  . Tdap 11/02/2011  . Zoster 11/19/2011  . Zoster Recombinat (Shingrix) 12/30/2019, 02/29/2020    These are the patient goals that we discussed: Goals Addressed              This Visit's Progress   .  Patient Stated (pt-stated)        12/01/2020 AWV Goal: Improved Nutrition/Diet  . Patient will verbalize understanding that diet plays an important role in overall health and that a poor diet is a risk factor for many chronic medical conditions.  . Over the next year, patient will improve self management of their diet by incorporating eat 6 small meals per day. . Patient will utilize available community resources to help with food acquisition if needed (ex: food pantries, [address], etc) . Patient will work with nutrition specialist if a  referral was made         This is a list of Health Maintenance Items that are overdue or due now: Pneumococcal vaccine  Covid booster Patient declined covid booster; but will take the pneumonia vaccine at the next in-office appointment. Colonoscopy due in November, 2022. Patient advised.  Orders/Referrals Placed Today: No orders of the defined types were placed in this encounter.  (Contact our referral department at [phone] if you have not spoken with someone about your referral appointment within the next 5 days)    Follow-up Plan . Follow-up with Hali Marry, MD as planned . Schedule a nurse visit for your pneumonia vaccine or it can be done at your next in-office appointment.  . Hepatitis C screening can be done with your next blood work appointment.  . Medicare wellness in one year.       Health Maintenance, Male Adopting a healthy lifestyle and getting preventive care are important in promoting health and wellness. Ask your health care provider about:  The right schedule for you to have regular tests and exams.  Things you can do on your own to prevent diseases and keep yourself healthy. What should I know about diet, weight, and exercise? Eat a healthy diet  Eat a diet that includes plenty of vegetables, fruits, low-fat dairy products, and lean protein.  Do not eat a lot of foods that are high in solid fats, added sugars, or sodium.   Maintain a healthy weight Body mass index (BMI) is a measurement that can be used to identify possible weight problems. It estimates body fat  based on height and weight. Your health care provider can help determine your BMI and help you achieve or maintain a healthy weight. Get regular exercise Get regular exercise. This is one of the most important things you can do for your health. Most adults should:  Exercise for at least 150 minutes each week. The exercise should increase your heart rate and make you sweat  (moderate-intensity exercise).  Do strengthening exercises at least twice a week. This is in addition to the moderate-intensity exercise.  Spend less time sitting. Even light physical activity can be beneficial. Watch cholesterol and blood lipids Have your blood tested for lipids and cholesterol at 68 years of age, then have this test every 5 years. You may need to have your cholesterol levels checked more often if:  Your lipid or cholesterol levels are high.  You are older than 68 years of age.  You are at high risk for heart disease. What should I know about cancer screening? Many types of cancers can be detected early and may often be prevented. Depending on your health history and family history, you may need to have cancer screening at various ages. This may include screening for:  Colorectal cancer.  Prostate cancer.  Skin cancer.  Lung cancer. What should I know about heart disease, diabetes, and high blood pressure? Blood pressure and heart disease  High blood pressure causes heart disease and increases the risk of stroke. This is more likely to develop in people who have high blood pressure readings, are of African descent, or are overweight.  Talk with your health care provider about your target blood pressure readings.  Have your blood pressure checked: ? Every 3-5 years if you are 43-38 years of age. ? Every year if you are 56 years old or older.  If you are between the ages of 50 and 1 and are a current or former smoker, ask your health care provider if you should have a one-time screening for abdominal aortic aneurysm (AAA). Diabetes Have regular diabetes screenings. This checks your fasting blood sugar level. Have the screening done:  Once every three years after age 25 if you are at a normal weight and have a low risk for diabetes.  More often and at a younger age if you are overweight or have a high risk for diabetes. What should I know about preventing  infection? Hepatitis B If you have a higher risk for hepatitis B, you should be screened for this virus. Talk with your health care provider to find out if you are at risk for hepatitis B infection. Hepatitis C Blood testing is recommended for:  Everyone born from [birth year] through 1963.  Anyone with known risk factors for hepatitis C. Sexually transmitted infections (STIs)  You should be screened each year for STIs, including gonorrhea and chlamydia, if: ? You are sexually active and are younger than 68 years of age. ? You are older than 68 years of age and your health care provider tells you that you are at risk for this type of infection. ? Your sexual activity has changed since you were last screened, and you are at increased risk for chlamydia or gonorrhea. Ask your health care provider if you are at risk.  Ask your health care provider about whether you are at high risk for HIV. Your health care provider may recommend a prescription medicine to help prevent HIV infection. If you choose to take medicine to prevent HIV, you should first get tested for HIV.  You should then be tested every 3 months for as long as you are taking the medicine. Follow these instructions at home: Lifestyle  Do not use any products that contain nicotine or tobacco, such as cigarettes, e-cigarettes, and chewing tobacco. If you need help quitting, ask your health care provider.  Do not use street drugs.  Do not share needles.  Ask your health care provider for help if you need support or information about quitting drugs. Alcohol use  Do not drink alcohol if your health care provider tells you not to drink.  If you drink alcohol: ? Limit how much you have to 0-2 drinks a day. ? Be aware of how much alcohol is in your drink. In the [place] one drink equals one 12 oz bottle of beer (355 mL), one 5 oz glass of wine (148 mL), or one 1 oz glass of hard liquor (44 mL). General instructions  Schedule regular health,  dental, and eye exams.  Stay current with your vaccines.  Tell your health care provider if: ? You often feel depressed. ? You have ever been abused or do not feel safe at home. Summary  Adopting a healthy lifestyle and getting preventive care are important in promoting health and wellness.  Follow your health care provider's instructions about healthy diet, exercising, and getting tested or screened for diseases.  Follow your health care provider's instructions on monitoring your cholesterol and blood pressure. This information is not intended to replace advice given to you by your health care provider. Make sure you discuss any questions you have with your health care provider. Document Revised: 07/19/2018 Document Reviewed: 07/19/2018 Elsevier Patient Education  2021 [employer].

## 2020-12-01 NOTE — Progress Notes (Signed)
MEDICARE ANNUAL WELLNESS VISIT  12/01/2020  Telephone Visit Disclaimer This Medicare AWV was conducted by telephone due to national recommendations for restrictions regarding the COVID-19 Pandemic (e.g. social distancing).  I verified, using two identifiers, that I am speaking with Victor Price or their authorized healthcare agent. I discussed the limitations, risks, security, and privacy concerns of performing an evaluation and management service by telephone and the potential availability of an in-person appointment in the future. The patient expressed understanding and agreed to proceed.  Location of Patient: Home Location of Provider (nurse):  In the office.  Subjective:    Victor Price is a 70 y.o. male patient of [name], Rene Kocher, MD who had a Medicare Annual Wellness Visit today via telephone. Victor Price is Working part time and lives with an adult companion the partner's childen. he has 0 children. he reports that he is socially active and does interact with friends/family regularly. he is moderately physically active and enjoys doing yardwork.  Patient Care Team: Hali Marry, MD as PCP - General  Advanced Directives 12/01/2020 08/15/2019 10/02/2014  Does Patient Have a Medical Advance Directive? Yes No No  Type of Paramedic of [place];Living will - -  Does patient want to make changes to medical advance directive? No - Patient declined - -  Copy of [place] in Chart? No - copy requested - -  Would patient like information on creating a medical advance directive? - No - Patient declined No - patient declined information    Hospital Utilization Over the Past 12 Months: # of hospitalizations or ER visits: 0 # of surgeries: 0  Review of Systems    Patient reports that his overall health is better compared to last year.  History obtained from chart review and the patient  Patient Reported Readings (BP, Pulse, CBG,  Weight, etc) none  Pain Assessment Pain : No/denies pain     Current Medications & Allergies (verified) Allergies as of 12/01/2020   No Known Allergies     Medication List       Accurate as of December 01, 2020  1:34 PM. If you have any questions, ask your nurse or doctor.        albuterol 108 (90 Base) MCG/ACT inhaler Commonly known as: VENTOLIN HFA Inhale 2 puffs into the lungs every 6 (six) hours as needed for wheezing or shortness of breath.   atorvastatin 40 MG tablet Commonly known as: LIPITOR Take 1 tablet (40 mg total) by mouth at bedtime.   benzonatate 200 MG capsule Commonly known as: TESSALON TAKE 1 CAPSULE (200 MG TOTAL) BY MOUTH AT BEDTIME AS NEEDED FOR COUGH.   bisoprolol-hydrochlorothiazide 10-6.25 MG tablet Commonly known as: ZIAC TAKE 1 TABLET BY MOUTH EVERY DAY   clindamycin 1 % external solution Commonly known as: CLEOCIN T 1(ONE) APPLICATION(S) TOPICAL 2(TWO) TIMES A DAY   [facility] Allergy Relief-D 10-240 MG 24 hr tablet Generic drug: loratadine-pseudoephedrine TAKE 1 TABLET BY MOUTH EVERY DAY   doxycycline 100 MG capsule Commonly known as: VIBRAMYCIN Take 100 mg by mouth 2 (two) times daily.   Lidocaine-Hydrocortisone Ace 3-0.5 % Kit Commonly known as: AnaMantle HC Apply 1 dose rectally up to  Bid prn for spasm.   meclizine 25 MG tablet Commonly known as: ANTIVERT Take 1 tablet (25 mg total) by mouth 3 (three) times daily as needed for dizziness.   mupirocin ointment 2 % Commonly known as: BACTROBAN Apply topically 3 (three) times daily.  pantoprazole 40 MG tablet Commonly known as: PROTONIX TAKE 1 TABLET BY MOUTH EVERY DAY   sildenafil 20 MG tablet Commonly known as: REVATIO TAKE 2-5 TABLETS (40-100 MG TOTAL) BY MOUTH DAILY AS NEEDED.   tamsulosin 0.4 MG Caps capsule Commonly known as: FLOMAX TAKE 1 CAPSULE (0.4 MG TOTAL) BY MOUTH DAILY AFTER SUPPER.   terbinafine 250 MG tablet Commonly known as: LAMISIL TAKE 1 TABLET BY MOUTH  EVERY DAY   Testosterone 10 MG/ACT (2%) Gel APPLY 1 PUMP TO AN INNER THIGH AND 2 PUMPS TO OPPOSITE INNER THIGH IN AM. FORTESTA.   traZODone 50 MG tablet Commonly known as: DESYREL TAKE 0.5-2 TABLETS (25-100 MG TOTAL) BY MOUTH AT BEDTIME AS NEEDED FOR SLEEP.       History (reviewed): Past Medical History:  Diagnosis Date  . BPH (benign prostatic hyperplasia)   . Bronchiectasis with acute lower respiratory infection ([place]) 08/13/2019  . Bronchitis    inhaler prn  . Cancer [facility])    skin cancer?  . ED (erectile dysfunction)   . GERD (gastroesophageal reflux disease)   . HLD (hyperlipidemia)   . Hypertension   . Pneumonia    gets recurrent PNA in left lung, recent dx 08/13/19 left lower lobe  . Seasonal allergies    Past Surgical History:  Procedure Laterality Date  . BACK SURGERY  1980s   [facility]  . COLONOSCOPY  2004, 2012   several - polyps  . MULTIPLE TOOTH EXTRACTIONS    . TOTAL HIP ARTHROPLASTY Left 08/24/2019   Procedure: LEFT TOTAL HIP ARTHROPLASTY-DIRECT ANTERIOR;  Surgeon: Marybelle Killings, MD;  Location: [place];  Service: Orthopedics;  Laterality: Left;   History reviewed. No pertinent family history. Social History   Socioeconomic History  . Marital status: Soil scientist    Spouse name: Not on file  . Number of children: 0  . Years of education: 16  . Highest education level: High school graduate  Occupational History  . Occupation: [employer]    Employer: [employer]    Comment: Retired    Comment: Part-time  Tobacco Use  . Smoking status: Never Smoker  . Smokeless tobacco: Never Used  Vaping Use  . Vaping Use: Never used  Substance and Sexual Activity  . Alcohol use: No  . Drug use: No  . Sexual activity: Yes  Other Topics Concern  . Not on file  Social History Narrative   Lives with his domestic partner and their family. Works part-time. 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   Social Determinants of Health   Financial Resource Strain: Low Risk    . Difficulty of Paying Living Expenses: Not hard at all  Food Insecurity: No Food Insecurity  . Worried About Charity fundraiser in the Last Year: Never true  . Ran Out of Food in the Last Year: Never true  Transportation Needs: No Transportation Needs  . Lack of Transportation (Medical): No  . Lack of Transportation (Non-Medical): No  Physical Activity: Sufficiently Active  . Days of Exercise per Week: 7 days  . Minutes of Exercise per Session: 60 min  Stress: No Stress Concern Present  . Feeling of Stress : Not at all  Social Connections: Moderately Integrated  . Frequency of Communication with Friends and Family: More than three times a week  . Frequency of Social Gatherings with Friends and Family: Twice a week  . Attends Religious Services: More than 4 times per year  . Active Member of Clubs or  Organizations: No  . Attends Archivist Meetings: Never  . Marital Status: Living with partner    Activities of Daily Living In your present state of health, do you have any difficulty performing the following activities: 12/01/2020  Hearing? N  Vision? N  Difficulty concentrating or making decisions? N  Walking or climbing stairs? N  Dressing or bathing? N  Doing errands, shopping? N  Preparing Food and eating ? N  Using the Toilet? N  In the past six months, have you accidently leaked urine? N  Do you have problems with loss of bowel control? N  Managing your Medications? N  Managing your Finances? N  Housekeeping or managing your Housekeeping? N  Some recent data might be hidden    Patient Education/ Literacy How often do you need to have someone help you when you read instructions, pamphlets, or other written materials from your doctor or pharmacy?: 1 - Never What is the last grade level you completed in school?: 2 years of college  Exercise Current Exercise Habits: Home exercise routine, Type of exercise: strength training/weights;treadmill, Time (Minutes): >  60, Frequency (Times/Week): >7, Weekly Exercise (Minutes/Week): 0, Intensity: Moderate, Exercise limited by: None identified  Diet Patient reports consuming 6 meals a day and 0 snack(s) a day Patient reports that his primary diet is: Regular Patient reports that she does have regular access to food.   Depression Screen PHQ 2/9 Scores 12/01/2020 09/10/2020 06/05/2019 12/19/2018 11/09/2018 06/22/2018 10/31/2017  PHQ - 2 Score 0 0 0 0 0 0 0     Fall Risk Fall Risk  12/01/2020 09/10/2020 06/05/2019 10/31/2017  Falls in the past year? 0 0 0 No  Number falls in past yr: 0 - 0 -  Injury with Fall? 0 - 0 -  Risk for fall due to : No Fall Risks No Fall Risks - -  Follow up Falls evaluation completed - - -     Objective:  Victor Price seemed alert and oriented and he participated appropriately during our telephone visit.  Blood Pressure Weight BMI  BP Readings from Last 3 Encounters:  09/10/20 132/70  04/24/20 127/78  04/09/20 134/70   Wt Readings from Last 3 Encounters:  09/10/20 172 lb (78 kg)  04/24/20 163 lb (73.9 kg)  04/09/20 165 lb (74.8 kg)   BMI Readings from Last 1 Encounters:  09/10/20 23.33 kg/m    *Unable to obtain current vital signs, weight, and BMI due to telephone visit type  Hearing/Vision  . Awais did not seem to have difficulty with hearing/understanding during the telephone conversation . Reports that he has not had a formal eye exam by an eye care professional within the past year . Reports that he has not had a formal hearing evaluation within the past year *Unable to fully assess hearing and vision during telephone visit type  Cognitive Function: 6CIT Screen 12/01/2020  What Year? 0 points  What month? 0 points  What time? 0 points  Count back from 20 0 points  Months in reverse 0 points  Repeat phrase 0 points  Total Score 0   (Normal:0-7, Significant for Dysfunction: >8)  Normal Cognitive Function Screening: Yes   Immunization & Health Maintenance  Record Immunization History  Administered Date(s) Administered  . Fluad Quad(high Dose 65+) 04/09/2020  . Influenza Split 05/17/2012  . Influenza Whole 05/10/2005, 04/22/2011  . Influenza, High Dose Seasonal PF 03/28/2019  . Influenza,inj,Quad PF,6+ Mos 04/02/2013, 05/28/2015  . Influenza-Unspecified 04/22/2016, 04/30/2017, 05/16/2018  .  PFIZER(Purple Top)SARS-COV-2 Vaccination 10/01/2019, 10/22/2019  . Pneumococcal Polysaccharide-23 05/10/2005  . Td 08/11/1998  . Tdap 11/02/2011  . Zoster 11/19/2011  . Zoster Recombinat (Shingrix) 12/30/2019, 02/29/2020    Health Maintenance  Topic Date Due  . COVID-19 Vaccine (3 - Booster for Pfizer series) 12/17/2020 (Originally 04/23/2020)  . PNA vac Low Risk Adult (1 of 2 - PCV13) 04/24/2021 (Originally 10/12/2017)  . Hepatitis C Screening  12/01/2021 (Originally 1950/09/27)  . INFLUENZA VACCINE  03/09/2021  . COLONOSCOPY (Pts 45-54yr Insurance coverage will need to be confirmed)  06/23/2021  . TETANUS/TDAP  11/01/2021  . HPV VACCINES  Aged Out       Assessment  This is a routine wellness examination for MNOEL RODIER  Health Maintenance: Due or Overdue There are no preventive care reminders to display for this patient.  MOdette Hornsdoes not need a referral for Community Assistance: Care Management:   no Social Work:    no Prescription Assistance:  no Nutrition/Diabetes Education:  no   Plan:  Personalized Goals Goals Addressed              This Visit's Progress   .  Patient Stated (pt-stated)        12/01/2020 AWV Goal: Improved Nutrition/Diet  . Patient will verbalize understanding that diet plays an important role in overall health and that a poor diet is a risk factor for many chronic medical conditions.  . Over the next year, patient will improve self management of their diet by incorporating eat 6 small meals per day. . Patient will utilize available community resources to help with food acquisition if needed (ex:  food pantries, [address], etc) . Patient will work with nutrition specialist if a referral was made       Personalized Health Maintenance & Screening Recommendations  Pneumococcal vaccine  Covid booster Patient declined covid booster; but will take the pneumonia vaccine at the next in-office appointment.  Colonoscopy due in November, 2022. Patient advised.  Lung Cancer Screening Recommended: no (Low Dose CT Chest recommended if Age 68-80years, 30 pack-year currently smoking OR have quit w/in past 15 years) Hepatitis C Screening recommended: yes HIV Screening recommended: no  Advanced Directives: Written information was not prepared per patient's request.  Referrals & Orders No orders of the defined types were placed in this encounter.   Follow-up Plan . Follow-up with MHali Marry MD as planned . Schedule a nurse visit for your pneumonia vaccine or it can be done at your next in-office appointment.  . Hepatitis C screening can be done with your next blood work appointment.  . Medicare wellness in one year.   I have personally reviewed and noted the following in the patient's chart:   . Medical and social history . Use of alcohol, tobacco or illicit drugs  . Current medications and supplements . Functional ability and status . Nutritional status . Physical activity . Advanced directives . List of other physicians . Hospitalizations, surgeries, and ER visits in previous 12 months . Vitals . Screenings to include cognitive, depression, and falls . Referrals and appointments  In addition, I have reviewed and discussed with MOdette Hornscertain preventive protocols, quality metrics, and best practice recommendations. A written personalized care plan for preventive services as well as general preventive health recommendations is available and can be mailed to the patient at his request.      BTinnie Gens RN  12/01/2020

## 2020-12-30 ENCOUNTER — Other Ambulatory Visit: Payer: Self-pay | Admitting: Family Medicine

## 2020-12-30 DIAGNOSIS — I10 Essential (primary) hypertension: Secondary | ICD-10-CM

## 2020-12-30 DIAGNOSIS — R351 Nocturia: Secondary | ICD-10-CM

## 2020-12-30 DIAGNOSIS — B351 Tinea unguium: Secondary | ICD-10-CM

## 2020-12-30 DIAGNOSIS — E785 Hyperlipidemia, unspecified: Secondary | ICD-10-CM

## 2021-01-02 ENCOUNTER — Telehealth: Payer: Self-pay | Admitting: Family Medicine

## 2021-01-02 NOTE — Chronic Care Management (AMB) (Signed)
  Chronic Care Management   Outreach Note  01/02/2021 Name: Victor Price MRN: [id number] DOB: 1950-12-24  Referred by: Hali Marry, MD Reason for referral : No chief complaint on file.   An unsuccessful telephone outreach was attempted today. The patient was referred to the pharmacist for assistance with care management and care coordination.   Follow Up Plan:   Lauretta Grill Upstream Scheduler

## 2021-01-02 NOTE — Progress Notes (Signed)
  Chronic Care Management   Outreach Note  01/02/2021 Name: Victor Price MRN: [id number] DOB: 04-Sep-1950  Referred by: Hali Marry, MD Reason for referral : No chief complaint on file.   An unsuccessful telephone outreach was attempted today. The patient was referred to the pharmacist for assistance with care management and care coordination.   Follow Up Plan:   SIGNATURE

## 2021-01-06 ENCOUNTER — Telehealth: Payer: Self-pay | Admitting: Family Medicine

## 2021-01-06 NOTE — Chronic Care Management (AMB) (Signed)
  Chronic Care Management   Note  01/06/2021 Name: Victor Price MRN: [id number] DOB: 1951-02-28  Victor Price is a 70 y.o. year old male who is a primary care patient of Madilyn Fireman, Rene Kocher, MD. I reached out to [name] by phone today in response to a referral sent by Victor Price PCP, Hali Marry, MD.   Victor Price was given information about Chronic Care Management services today including:  1. CCM service includes personalized support from designated clinical staff supervised by his physician, including individualized plan of care and coordination with other care providers 2. 24/7 contact phone numbers for assistance for urgent and routine care needs. 3. Service will only be billed when office clinical staff spend 20 minutes or more in a month to coordinate care. 4. Only one practitioner may furnish and bill the service in a calendar month. 5. The patient may stop CCM services at any time (effective at the end of the month) by phone call to the office staff.   Patient agreed to services and verbal consent obtained.   Follow up plan:   Victor Price Upstream Scheduler

## 2021-02-13 ENCOUNTER — Telehealth: Payer: Self-pay | Admitting: Pharmacist

## 2021-02-13 NOTE — Chronic Care Management (AMB) (Signed)
Chronic Care Management Pharmacy Assistant   Name: DELSHAWN STECH  MRN: [id number] DOB: [birth year]  Victor Price is an 70 y.o. year old male who presents for his initial CCM visit with the clinical pharmacist.  Reason for Encounter: Initial CCM Visit   Recent office visits:  12/01/20 Beatrice Lecher MD(PCP)- Patient seen for Medicare annual wellness. Follow up as planned with PCP.  09/22/20 (Video Visit) Beatrice Lecher MD ( PCP)- Patient was seen for vertigo. Patient started Meclizine  HCI 25 mg TID. Follow up as needed.  09/10/20 Beatrice Lecher MD(PCP)- Patient seen for general follow up. Labs ordered for Basic Metabolic Panel with GFR. Follow up in 6 months.  Recent consult visits:  09/30/20 Rudell Cobb (Physical Therapy)- Patient was seen for dizziness and giddiness.  Hospital visits:  None in previous 6 months  Medications: Outpatient Encounter Medications as of 02/13/2021  Medication Sig   albuterol (VENTOLIN HFA) 108 (90 Base) MCG/ACT inhaler Inhale 2 puffs into the lungs every 6 (six) hours as needed for wheezing or shortness of breath.   atorvastatin (LIPITOR) 40 MG tablet Take 1 tablet (40 mg total) by mouth at bedtime.   benzonatate (TESSALON) 200 MG capsule TAKE 1 CAPSULE (200 MG TOTAL) BY MOUTH AT BEDTIME AS NEEDED FOR COUGH.   bisoprolol-hydrochlorothiazide (ZIAC) 10-6.25 MG tablet TAKE 1 TABLET BY MOUTH EVERY DAY   clindamycin (CLEOCIN T) 1 % external solution 1(ONE) APPLICATION(S) TOPICAL 2(TWO) TIMES A DAY   [facility]S ALLERGY RELIEF-D 10-240 MG 24 hr tablet TAKE 1 TABLET BY MOUTH EVERY DAY   doxycycline (VIBRAMYCIN) 100 MG capsule Take 100 mg by mouth 2 (two) times daily.   Lidocaine-Hydrocortisone Ace (ANAMANTLE HC) 3-0.5 % KIT Apply 1 dose rectally up to  Bid prn for spasm.   meclizine (ANTIVERT) 25 MG tablet Take 1 tablet (25 mg total) by mouth 3 (three) times daily as needed for dizziness.   mupirocin ointment (BACTROBAN) 2 % Apply topically 3 (three)  times daily.   pantoprazole (PROTONIX) 40 MG tablet TAKE 1 TABLET BY MOUTH EVERY DAY   sildenafil (REVATIO) 20 MG tablet TAKE 2-5 TABLETS (40-100 MG TOTAL) BY MOUTH DAILY AS NEEDED.   tamsulosin (FLOMAX) 0.4 MG CAPS capsule TAKE 1 CAPSULE BY MOUTH EVERY DAY AFTER SUPPER   terbinafine (LAMISIL) 250 MG tablet TAKE 1 TABLET BY MOUTH EVERY DAY   Testosterone 10 MG/ACT (2%) GEL APPLY 1 PUMP TO AN INNER THIGH AND 2 PUMPS TO OPPOSITE INNER THIGH IN AM. FORTESTA.   traZODone (DESYREL) 50 MG tablet TAKE 0.5-2 TABLETS (25-100 MG TOTAL) BY MOUTH AT BEDTIME AS NEEDED FOR SLEEP.   No facility-administered encounter medications on file as of 02/13/2021.    Medications albuterol (VENTOLIN HFA) 108 (90 Base) MCG/ACT inhaler last filled 02/25/20 30 DS atorvastatin (LIPITOR) 40 MG tablet last filled 01/15/21 90 DS benzonatate (TESSALON) 200 MG capsule last filled 12/19/20 10 DS bisoprolol-hydrochlorothiazide (ZIAC) 10-6.25 MG tablet last filled 12/31/20 90 DS clindamycin (CLEOCIN T) 1 % external solution last filled 10/08/20 19 DS [facility] ALLERGY RELIEF-D 10-240 MG 24 hr tablet last filled  06//20/22 30 DS doxycycline (VIBRAMYCIN) 100 MG capsule last filled 01/28/21 30 DS meclizine (ANTIVERT) 25 MG tablet last filled 09/22/20 10 DS mupirocin ointment (BACTROBAN) 2 % last filled 01/15/21 14 DS pantoprazole (PROTONIX) 40 MG tablet last filled 01/21/21 90 DS sildenafil (REVATIO) 20 MG tablet last filled 05/24/20 8 DS tamsulosin (FLOMAX) 0.4 MG CAPS capsule last filled 12/31/20 90 DS terbinafine (LAMISIL) 250 MG tablet  last filled 12/31/20 90 DS Testosterone 10 MG/ACT (2%) GEL last filled 09/17/20 30 DS trazodone (DESYREL) 50 MG tablet last filled 12/30/20 90 DS   Star Rating Drugs:  atorvastatin (LIPITOR) 40 MG tablet last filled 01/15/21 90 DS  [place] Pharmacist Assistant [phone]

## 2021-02-17 ENCOUNTER — Telehealth: Payer: Self-pay | Admitting: Pharmacist

## 2021-02-17 ENCOUNTER — Telehealth: Payer: Medicare HMO

## 2021-02-17 NOTE — Progress Notes (Deleted)
Current Barriers:  {PHARMCACYBARRIERS:21091514}  Pharmacist Clinical Goal(s):  Over the next *** days, patient will {PHARMACYGOALCHOICES:25079} through collaboration with PharmD and provider.   Interventions: 1:1 collaboration with Hali Marry, MD regarding development and update of comprehensive plan of care as evidenced by provider attestation and co-signature Inter-disciplinary care team collaboration (see longitudinal plan of care) Comprehensive medication review performed; medication list updated in electronic medical record  Hypertension:  Uncontrolled/controlled; current treatment:bisoprolol-hctz 10-6.25mg daily;   Current home readings:   Denies/reports hypotensive/hypertensive symptoms  {PHARMACYINTERVENTION:21091513} and Hyperlipidemia:  Uncontrolled/controlled; current treatment:atorvastatin 50m ;   Medications previously tried: ***   Current dietary patterns: ***  {PHARMACYINTERVENTION:21091513}  Patient Goals/Self-Care Activities Over the next *** days, patient will:  {PHARMACYPATIENTGOALS:25081}  Follow Up Plan: {CM FOLLOW UP PUUVO:53664}***     Chronic Care Management Pharmacy Note  02/17/2021 Name:  Victor SPACKMANMRN:  [id number]:  [birth year] Summary:  Recommendations/Changes made from today's visit:  Plan:  Subjective: Victor TETRICKis an 70y.o. year old male who is a primary patient of [name], CRene Kocher MD.  The CCM team was consulted for assistance with disease management and care coordination needs.    {CCMTELEPHONEFACETOFACE:21091510} for {CCMINITIALFOLLOWUPCHOICE:21091511} in response to provider referral for pharmacy case management and/or care coordination services.   Consent to Services:  {CCMCONSENTOPTIONS:25074}  Patient Care Team: MHali Marry MD as PCP - General KDarius Bump [facility]as Pharmacist (Pharmacist)  Recent office visits:  12/01/20 CBeatrice LecherMD(PCP)- Patient seen for Medicare annual  wellness. Follow up as planned with PCP.   09/22/20 (Video Visit) CBeatrice LecherMD ( PCP)- Patient was seen for vertigo. Patient started Meclizine  HCI 25 mg TID. Follow up as needed.   09/10/20 CBeatrice LecherMD(PCP)- Patient seen for general follow up. Labs ordered for Basic Metabolic Panel with GFR. Follow up in 6 months.   Recent consult visits:  09/30/20 CRudell Cobb(Physical Therapy)- Patient was seen for dizziness and giddiness.   Hospital visits:  None in previous 6 months  Objective:  Lab Results  Component Value Date   CREATININE 1.41 (H) 10/14/2020   CREATININE 1.49 (H) 05/01/2020   CREATININE 1.46 (H) 08/15/2019    No results found for: HGBA1C Last diabetic Eye exam: No results found for: HMDIABEYEEXA  Last diabetic Foot exam: No results found for: HMDIABFOOTEX      Component Value Date/Time   CHOL 129 05/01/2020 1115   TRIG 134 05/01/2020 1115   HDL 56 05/01/2020 1115   CHOLHDL 2.3 05/01/2020 1115   VLDL 30 08/13/2014 0911   LDLCALC 51 05/01/2020 1115    Hepatic Function Latest Ref Rng & Units 05/01/2020 08/15/2019 06/27/2018  Total Protein 6.1 - 8.1 g/dL 6.8 7.9 6.7  Albumin 3.5 - 5.0 g/dL - 3.8 -  AST 10 - 35 U/L 16 18 12   ALT 9 - 46 U/L 16 23 27   Alk Phosphatase 38 - 126 U/L - 75 -  Total Bilirubin 0.2 - 1.2 mg/dL 0.5 0.6 0.9    Lab Results  Component Value Date/Time   TSH 0.11 (L) 10/20/2017 11:28 AM   TSH 0.08 (L) 09/16/2017 04:20 PM   FREET4 1.1 02/06/2019 08:31 AM   FREET4 1.2 06/27/2018 09:51 AM    CBC Latest Ref Rng & Units 05/01/2020 10/10/2019 08/15/2019  WBC 3.8 - 10.8 Thousand/uL 6.6 7.6 11.5(H)  Hemoglobin 13.2 - 17.1 g/dL 15.0 11.9(L) 14.2  Hematocrit 38.5 - 50.0 % 45.0 36.3(L) 43.7  Platelets 140 - 400  Thousand/uL 212 399 419(H)    No results found for: VD25OH  Clinical ASCVD: {YES/NO:21197} The ASCVD Risk score Mikey Bussing DC Jr., et al., 2013) failed to calculate for the following reasons:   The valid total cholesterol range is  130 to 320 mg/dL    Other: (CHADS2VASc if Afib, PHQ9 if depression, MMRC or CAT for COPD, ACT, DEXA)  Social History   Tobacco Use  Smoking Status Never  Smokeless Tobacco Never   BP Readings from Last 3 Encounters:  09/10/20 132/70  04/24/20 127/78  04/09/20 134/70   Pulse Readings from Last 3 Encounters:  09/10/20 67  04/24/20 77  04/09/20 69   Wt Readings from Last 3 Encounters:  09/10/20 172 lb (78 kg)  04/24/20 163 lb (73.9 kg)  04/09/20 165 lb (74.8 kg)    Assessment: Review of patient past medical history, allergies, medications, health status, including review of consultants reports, laboratory and other test data, was performed as part of comprehensive evaluation and provision of chronic care management services.   SDOH:  (Social Determinants of Health) assessments and interventions performed:    CCM Care Plan  No Known Allergies  Medications Reviewed Today     Reviewed by Tinnie Gens, RN (Registered Nurse) on 12/01/20 at 1307  Med List Status: <None>   Medication Order Taking? Sig Documenting Provider Last Dose Status Informant  albuterol (VENTOLIN HFA) 108 (90 Base) MCG/ACT inhaler [id number] Yes Inhale 2 puffs into the lungs every 6 (six) hours as needed for wheezing or shortness of breath. Hali Marry, MD Taking Active Self  atorvastatin (LIPITOR) 40 MG tablet [id number] Yes Take 1 tablet (40 mg total) by mouth at bedtime. Hali Marry, MD Taking Active   benzonatate (TESSALON) 200 MG capsule [id number] Yes TAKE 1 CAPSULE (200 MG TOTAL) BY MOUTH AT BEDTIME AS NEEDED FOR COUGH. Hali Marry, MD Taking Active   bisoprolol-hydrochlorothiazide [facility]) 10-6.25 MG tablet [id number] Yes TAKE 1 TABLET BY MOUTH EVERY DAY Hali Marry, MD Taking Active   [facility] ALLERGY RELIEF-D 10-240 MG 24 hr tablet [id number] Yes TAKE 1 TABLET BY MOUTH EVERY DAY Hali Marry, MD Taking Active   Lidocaine-Hydrocortisone Ace (ANAMANTLE [facility]) 3-0.5 %  KIT [id number] Yes Apply 1 dose rectally up to  Bid prn for spasm. Hali Marry, MD Taking Active   meclizine (ANTIVERT) 25 MG tablet [id number] Yes Take 1 tablet (25 mg total) by mouth 3 (three) times daily as needed for dizziness. Hali Marry, MD Taking Active   mupirocin ointment (BACTROBAN) 2 % [id number] Yes Apply topically 3 (three) times daily. Hali Marry, MD Taking Active   pantoprazole (PROTONIX) 40 MG tablet [id number] Yes TAKE 1 TABLET BY MOUTH EVERY DAY Hali Marry, MD Taking Active   sildenafil (REVATIO) 20 MG tablet [id number] Yes TAKE 2-5 TABLETS (40-100 MG TOTAL) BY MOUTH DAILY AS NEEDED. Hali Marry, MD Taking Active   tamsulosin [facility]) 0.4 MG CAPS capsule [id number] Yes TAKE 1 CAPSULE (0.4 MG TOTAL) BY MOUTH DAILY AFTER SUPPER. Hali Marry, MD Taking Active   terbinafine (LAMISIL) 250 MG tablet [id number] Yes TAKE 1 TABLET BY MOUTH EVERY DAY Hali Marry, MD Taking Active   Testosterone 10 MG/ACT (2%) GEL [id number] Yes APPLY 1 PUMP TO AN INNER THIGH AND 2 PUMPS TO OPPOSITE INNER THIGH IN AM. Harlene Salts. Hali Marry, MD Taking Active   traZODone (DESYREL) 50 MG tablet [id number] Yes TAKE 0.5-2 TABLETS (25-100 MG TOTAL) BY MOUTH AT BEDTIME AS  NEEDED FOR SLEEP. Hali Marry, MD Taking Active             Patient Active Problem List   Diagnosis Date Noted   Carpal tunnel syndrome, left upper limb 12/04/2019   H/O total hip arthroplasty, left 10/19/2019   DDD (degenerative disc disease), lumbar 06/05/2019   Subclinical hyperthyroidism 10/31/2017   Hypogonadism in male 10/31/2017   CKD (chronic kidney disease) stage 3, GFR 30-59 ml/min ([place]) 02/08/2017   Bronchiectasis ([place]) 07/20/2016   Radiculitis of right cervical region 08/28/2014   DDD (degenerative disc disease), cervical 08/28/2014   Insomnia 04/30/2014   Peripheral neuropathy 04/02/2013   Herniated disc 01/29/2013   BPH (benign prostatic  hyperplasia) 12/14/2011   ERECTILE DYSFUNCTION, ORGANIC 07/23/2010   THYROID NODULE, LEFT 05/23/2007   Hyperlipidemia 05/17/2006   HYPERTENSION, BENIGN SYSTEMIC 05/17/2006    Immunization History  Administered Date(s) Administered   Fluad Quad(high Dose 65+) 04/09/2020   Influenza Split 05/17/2012   Influenza Whole 05/10/2005, 04/22/2011   Influenza, High Dose Seasonal PF 03/28/2019   Influenza,inj,Quad PF,6+ Mos 04/02/2013, 05/28/2015   Influenza-Unspecified 04/22/2016, 04/30/2017, 05/16/2018   PFIZER(Purple Top)SARS-COV-2 Vaccination 10/01/2019, 10/22/2019   Pneumococcal Polysaccharide-23 05/10/2005   Td 08/11/1998   Tdap 11/02/2011   Zoster Recombinat (Shingrix) 12/30/2019, 02/29/2020   Zoster, Live 11/19/2011    Conditions to be addressed/monitored: {CCM ASSESSMENT DISEASE OPTIONS:25047}  There are no care plans that you recently modified to display for this patient.   Medication Assistance: {MEDASSISTANCEINFO:25044}  [facility]  [id number] [place] [place]Phone: [phone]: [phone] Uses pill box? {Yes or If no, why not?:20788} Pt endorses ***% compliance  Follow Up:  {FOLLOWUP:24991}  Plan: {CM FOLLOW UP PLAN:25073}  SIG***

## 2021-02-17 NOTE — Telephone Encounter (Signed)
Unsuccessful outreach attempt for telephone encounter x2, phone was off, left voicemail. Patient missed initial appointment for CCM services with a pharmacist.  Routing to schedule team for reschedule.  Victor Price

## 2021-02-17 NOTE — Telephone Encounter (Signed)
Patient's appointment has been removed from schedule. AM

## 2021-02-20 ENCOUNTER — Other Ambulatory Visit: Payer: Self-pay

## 2021-02-20 ENCOUNTER — Ambulatory Visit (INDEPENDENT_AMBULATORY_CARE_PROVIDER_SITE_OTHER): Payer: Medicare HMO | Admitting: Family Medicine

## 2021-02-20 ENCOUNTER — Encounter: Payer: Self-pay | Admitting: Family Medicine

## 2021-02-20 VITALS — BP 121/71 | HR 88 | Ht 72.0 in | Wt 164.0 lb

## 2021-02-20 DIAGNOSIS — M79675 Pain in left toe(s): Secondary | ICD-10-CM | POA: Diagnosis not present

## 2021-02-20 DIAGNOSIS — M79674 Pain in right toe(s): Secondary | ICD-10-CM | POA: Diagnosis not present

## 2021-02-20 DIAGNOSIS — R059 Cough, unspecified: Secondary | ICD-10-CM

## 2021-02-20 DIAGNOSIS — I781 Nevus, non-neoplastic: Secondary | ICD-10-CM

## 2021-02-20 DIAGNOSIS — G5793 Unspecified mononeuropathy of bilateral lower limbs: Secondary | ICD-10-CM | POA: Diagnosis not present

## 2021-02-20 NOTE — Progress Notes (Signed)
Acute Office Visit  Subjective:    Patient ID: Victor Price, male    DOB: Jan 11, 1951, 70 y.o.   MRN: [id number]  Chief Complaint  Patient presents with   Numbness    HPI Patient is in today for redness of his toes he says is been going on for maybe about a month.  He has a history of degenerative disc disease L4-5 which causes chronic numbness in both feet and it has been flaring lately in fact he has been working with the surgeon to discuss possibly having surgical treatment this summer.  But again over the last month he started noticing on his left foot that a couple of toes started to look red right around the nail border and then a couple of toes on the right foot.  He says they are little tender to touch but because of the neuropathy he is not sure how much pain he is actually experiencing.  No recent trauma or injury.  No known history of peripheral vascular disease.  He says they actually looked worse but he started doing some Epson salt soaks over the last several days and feels like it actually looks better.  The toe on the left side that is the most red he says almost really looked like a blister initially.  He also reports a cough deep in his chest for about last 45 days he says he is actually otherwise felt fine he has not run a temperature he has not felt short of breath.  He has not had any sinus symptoms.  But he just wanted me to know about it no known COVID exposures.  Past Medical History:  Diagnosis Date   BPH (benign prostatic hyperplasia)    Bronchiectasis with acute lower respiratory infection ([place]) 08/13/2019   Bronchitis    inhaler prn   Cancer (HCC)    skin cancer?   ED (erectile dysfunction)    GERD (gastroesophageal reflux disease)    HLD (hyperlipidemia)    Hypertension    Pneumonia    gets recurrent PNA in left lung, recent dx 08/13/19 left lower lobe   Seasonal allergies     Past Surgical History:  Procedure Laterality Date   BACK SURGERY  1980s    [facility]   COLONOSCOPY  2004, 2012   several - polyps   MULTIPLE TOOTH EXTRACTIONS     TOTAL HIP ARTHROPLASTY Left 08/24/2019   Procedure: LEFT TOTAL HIP ARTHROPLASTY-DIRECT ANTERIOR;  Surgeon: Marybelle Killings, MD;  Location: [facility];  Service: Orthopedics;  Laterality: Left;    History reviewed. No pertinent family history.  Social History   Socioeconomic History   Marital status: Soil scientist    Spouse name: Not on file   Number of children: 0   Years of education: 14   Highest education level: High school graduate  Occupational History   Occupation: [employer]    Employer: [employer]: Retired    Comment: Part-time  Tobacco Use   Smoking status: Never   Smokeless tobacco: Never  Vaping Use   Vaping Use: Never used  Substance and Sexual Activity   Alcohol use: No   Drug use: No   Sexual activity: Yes  Other Topics Concern   Not on file  Social History Narrative   Lives with his domestic partner and their family. Works part-time. 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   Social Determinants of Health   Financial Resource Strain: Low Risk  Difficulty of Paying Living Expenses: Not hard at all  Food Insecurity: No Food Insecurity   Worried About [place] in the Last Year: Never true   Ran Out of Food in the Last Year: Never true  Transportation Needs: No Transportation Needs   Lack of Transportation (Medical): No   Lack of Transportation (Non-Medical): No  Physical Activity: Sufficiently Active   Days of Exercise per Week: 7 days   Minutes of Exercise per Session: 60 min  Stress: No Stress Concern Present   Feeling of Stress : Not at all  Social Connections: Moderately Integrated   Frequency of Communication with Friends and Family: More than three times a week   Frequency of Social Gatherings with Friends and Family: Twice a week   Attends Religious Services: More than 4 times per year   Active Member of [employer] or Organizations: No   Attends  Music therapist: Never   Marital Status: Living with partner  Intimate Partner Violence: Not At Risk   Fear of Current or Ex-Partner: No   Emotionally Abused: No   Physically Abused: No   Sexually Abused: No    Outpatient Medications Prior to Visit  Medication Sig Dispense Refill   albuterol (VENTOLIN HFA) 108 (90 Base) MCG/ACT inhaler Inhale 2 puffs into the lungs every 6 (six) hours as needed for wheezing or shortness of breath. 18 g prn   atorvastatin (LIPITOR) 40 MG tablet Take 1 tablet (40 mg total) by mouth at bedtime. 90 tablet 3   benzonatate (TESSALON) 200 MG capsule TAKE 1 CAPSULE (200 MG TOTAL) BY MOUTH AT BEDTIME AS NEEDED FOR COUGH. 30 capsule 2   bisoprolol-hydrochlorothiazide (ZIAC) 10-6.25 MG tablet TAKE 1 TABLET BY MOUTH EVERY DAY 90 tablet 0   clindamycin (CLEOCIN T) 1 % external solution 1(ONE) APPLICATION(S) TOPICAL 2(TWO) TIMES A DAY     [facility] ALLERGY RELIEF-D 10-240 MG 24 hr tablet TAKE 1 TABLET BY MOUTH EVERY DAY 30 tablet 11   Lidocaine-Hydrocortisone Ace (ANAMANTLE [facility]) 3-0.5 % KIT Apply 1 dose rectally up to  Bid prn for spasm. 1 kit PRN   mupirocin ointment (BACTROBAN) 2 % Apply topically 3 (three) times daily. 30 g prn   pantoprazole (PROTONIX) 40 MG tablet TAKE 1 TABLET BY MOUTH EVERY DAY 90 tablet 3   sildenafil (REVATIO) 20 MG tablet TAKE 2-5 TABLETS (40-100 MG TOTAL) BY MOUTH DAILY AS NEEDED. 40 tablet 5   tamsulosin (FLOMAX) 0.4 MG CAPS capsule TAKE 1 CAPSULE BY MOUTH EVERY DAY AFTER SUPPER 90 capsule 1   terbinafine (LAMISIL) 250 MG tablet TAKE 1 TABLET BY MOUTH EVERY DAY 90 tablet 1   Testosterone 10 MG/ACT (2%) GEL APPLY 1 PUMP TO AN INNER THIGH AND 2 PUMPS TO OPPOSITE INNER THIGH IN AM. Harlene Salts. 120 g 2   traZODone (DESYREL) 50 MG tablet TAKE 0.5-2 TABLETS (25-100 MG TOTAL) BY MOUTH AT BEDTIME AS NEEDED FOR SLEEP. 180 tablet 3   doxycycline (VIBRAMYCIN) 100 MG capsule Take 100 mg by mouth 2 (two) times daily.     meclizine (ANTIVERT) 25 MG  tablet Take 1 tablet (25 mg total) by mouth 3 (three) times daily as needed for dizziness. 30 tablet 0   No facility-administered medications prior to visit.    No Known Allergies  Review of Systems     Objective:    Physical Exam Constitutional:      Appearance: He is well-developed.  HENT:     Head: Normocephalic and atraumatic.  Cardiovascular:     Rate and Rhythm: Normal rate and regular rhythm.     Heart sounds: Normal heart sounds.  Pulmonary:     Effort: Pulmonary effort is normal.     Comments: Diffuse rhonchi worse on the left compared to the right. Skin:    General: Skin is warm and dry.  Neurological:     Mental Status: He is alert and oriented to person, place, and time.  Psychiatric:        Behavior: Behavior normal.        BP 121/71   Pulse 88   Ht 6' (1.829 m)   Wt 164 lb (74.4 kg)   SpO2 100%   BMI 22.24 kg/m  Wt Readings from Last 3 Encounters:  02/20/21 164 lb (74.4 kg)  09/10/20 172 lb (78 kg)  04/24/20 163 lb (73.9 kg)    Health Maintenance Due  Topic Date Due   COVID-19 Vaccine (3 - Booster for Pfizer series) 03/23/2020    There are no preventive care reminders to display for this patient.   Lab Results  Component Value Date   TSH 0.11 (L) 10/20/2017   Lab Results  Component Value Date   WBC 6.6 05/01/2020   HGB 15.0 05/01/2020   HCT 45.0 05/01/2020   MCV 92.8 05/01/2020   PLT 212 05/01/2020   Lab Results  Component Value Date   NA 140 10/14/2020   K 4.1 10/14/2020   CO2 30 10/14/2020   GLUCOSE 100 (H) 10/14/2020   BUN 18 10/14/2020   CREATININE 1.41 (H) 10/14/2020   BILITOT 0.5 05/01/2020   ALKPHOS 75 08/15/2019   AST 16 05/01/2020   ALT 16 05/01/2020   PROT 6.8 05/01/2020   ALBUMIN 3.8 08/15/2019   CALCIUM 9.6 10/14/2020   ANIONGAP 8 08/15/2019   Lab Results  Component Value Date   CHOL 129 05/01/2020   Lab Results  Component Value Date   HDL 56 05/01/2020   Lab Results  Component Value Date    LDLCALC 51 05/01/2020   Lab Results  Component Value Date   TRIG 134 05/01/2020   Lab Results  Component Value Date   CHOLHDL 2.3 05/01/2020   No results found for: HGBA1C     Assessment & Plan:   Problem List Items Addressed This Visit   None Visit Diagnoses     Telangiectasias    -  Primary   Relevant Orders   ANA   Sedimentation rate   C-reactive protein   Sjogren's syndrome antibods(ssa + ssb)   Rheumatoid factor   Cyclic citrul peptide antibody, IgG   Anti-Scleroderma Antibody   VAS [name] ABI WITH/WO TBI   Pain in toes of both feet       Relevant Orders   ANA   Sedimentation rate   C-reactive protein   Sjogren's syndrome antibods(ssa + ssb)   Rheumatoid factor   Cyclic citrul peptide antibody, IgG   Anti-Scleroderma Antibody   VAS [name] ABI WITH/WO TBI   Neuropathy involving both lower extremities       Relevant Orders   ANA   Sedimentation rate   C-reactive protein   Sjogren's syndrome antibods(ssa + ssb)   Rheumatoid factor   Cyclic citrul peptide antibody, IgG   Anti-Scleroderma Antibody   VAS [name] ABI WITH/WO TBI   Cough          Erythema around the nail border of several of his toes.  In looking with magnification it does look like  he has a few telangiectasias as well which is concerning for possible autoimmune disorder.  I was able to palpate dorsal pedal pulses bilaterally though they were weaker on the right some also get a schedule for ABIs just to evaluate further for good blood flow.  I did encourage him to keep an eye on the area just to make sure that they are not spreading or turning even darker.  Monitor for any nail injury.  Epson salt soaks do seem to be helping so okay to continue those.  Cough-he has coarse breath sounds with some rhonchi particularly on the left but this is actually his baseline lung and lung exam and he is otherwise feeling well so we discussed keeping an eye on it over the weekend if not improving or suddenly getting worse  then please let us know.  Lateral neuropathy secondary to lumbar spine issue-considering having surgery this summer.  No orders of the defined types were placed in this encounter.    Beatrice Lecher, MD

## 2021-02-23 ENCOUNTER — Telehealth: Payer: Self-pay | Admitting: *Deleted

## 2021-02-24 DIAGNOSIS — I781 Nevus, non-neoplastic: Secondary | ICD-10-CM | POA: Diagnosis not present

## 2021-02-24 DIAGNOSIS — M79674 Pain in right toe(s): Secondary | ICD-10-CM | POA: Diagnosis not present

## 2021-02-24 DIAGNOSIS — G5793 Unspecified mononeuropathy of bilateral lower limbs: Secondary | ICD-10-CM | POA: Diagnosis not present

## 2021-02-24 DIAGNOSIS — M79675 Pain in left toe(s): Secondary | ICD-10-CM | POA: Diagnosis not present

## 2021-02-24 MED ORDER — PREDNISONE 20 MG PO TABS: 40.0000 mg | ORAL_TABLET | Freq: Every day | ORAL | 0 refills | Status: DC

## 2021-02-24 NOTE — Telephone Encounter (Signed)
Meds ordered this encounter  Medications   predniSONE (DELTASONE) 20 MG tablet    Sig: Take 2 tablets (40 mg total) by mouth daily with breakfast.    Dispense:  10 tablet    Refill:  0    

## 2021-02-26 LAB — RHEUMATOID FACTOR: Rheumatoid fact SerPl-aCnc: <14 IU/mL (ref <14)

## 2021-02-26 LAB — ANA: Anti Nuclear Antibody (ANA): NEGATIVE

## 2021-02-26 LAB — SJOGREN'S SYNDROME ANTIBODS(SSA + SSB)
SSA (Ro) (ENA) Antibody, IgG: <1 AI
SSB (La) (ENA) Antibody, IgG: <1 AI

## 2021-02-26 LAB — SEDIMENTATION RATE: Sed Rate: 14 mm/h (ref 0–20)

## 2021-02-26 LAB — ANTI-SCLERODERMA ANTIBODY: Scleroderma (Scl-70) (ENA) Antibody, IgG: <1 AI

## 2021-02-26 LAB — C-REACTIVE PROTEIN: CRP: 19.3 mg/L — ABNORMAL HIGH (ref <8.0)

## 2021-02-26 LAB — CYCLIC CITRUL PEPTIDE ANTIBODY, IGG: Cyclic Citrullin Peptide Ab: <16 UNITS

## 2021-02-27 MED ORDER — DOXYCYCLINE HYCLATE 100 MG PO TABS: 100.0000 mg | ORAL_TABLET | Freq: Two times a day (BID) | ORAL | 0 refills | Status: DC

## 2021-02-27 NOTE — Addendum Note (Signed)
Addended by: Beatrice Lecher D on: 02/27/2021 10:46 AM   Modules accepted: Orders

## 2021-03-29 ENCOUNTER — Other Ambulatory Visit: Payer: Self-pay | Admitting: Family Medicine

## 2021-03-29 DIAGNOSIS — G4700 Insomnia, unspecified: Secondary | ICD-10-CM

## 2021-03-30 ENCOUNTER — Other Ambulatory Visit: Payer: Self-pay | Admitting: Family Medicine

## 2021-03-30 DIAGNOSIS — I10 Essential (primary) hypertension: Secondary | ICD-10-CM

## 2021-03-30 DIAGNOSIS — E785 Hyperlipidemia, unspecified: Secondary | ICD-10-CM

## 2021-03-31 NOTE — Telephone Encounter (Signed)
Okay to fill? 

## 2021-04-15 DIAGNOSIS — Z85828 Personal history of other malignant neoplasm of skin: Secondary | ICD-10-CM | POA: Diagnosis not present

## 2021-04-15 DIAGNOSIS — L821 Other seborrheic keratosis: Secondary | ICD-10-CM | POA: Diagnosis not present

## 2021-04-15 DIAGNOSIS — L814 Other melanin hyperpigmentation: Secondary | ICD-10-CM | POA: Diagnosis not present

## 2021-04-15 DIAGNOSIS — X32XXXS Exposure to sunlight, sequela: Secondary | ICD-10-CM | POA: Diagnosis not present

## 2021-04-15 DIAGNOSIS — L309 Dermatitis, unspecified: Secondary | ICD-10-CM | POA: Diagnosis not present

## 2021-04-15 DIAGNOSIS — D1801 Hemangioma of skin and subcutaneous tissue: Secondary | ICD-10-CM | POA: Diagnosis not present

## 2021-04-15 DIAGNOSIS — L2089 Other atopic dermatitis: Secondary | ICD-10-CM | POA: Diagnosis not present

## 2021-04-16 ENCOUNTER — Other Ambulatory Visit: Payer: Self-pay | Admitting: Family Medicine

## 2021-04-16 DIAGNOSIS — E785 Hyperlipidemia, unspecified: Secondary | ICD-10-CM

## 2021-04-20 DIAGNOSIS — L2389 Allergic contact dermatitis due to other agents: Secondary | ICD-10-CM | POA: Diagnosis not present

## 2021-04-22 DIAGNOSIS — L2389 Allergic contact dermatitis due to other agents: Secondary | ICD-10-CM | POA: Diagnosis not present

## 2021-04-27 ENCOUNTER — Other Ambulatory Visit: Payer: Self-pay | Admitting: Family Medicine

## 2021-04-27 DIAGNOSIS — I10 Essential (primary) hypertension: Secondary | ICD-10-CM

## 2021-04-27 DIAGNOSIS — E785 Hyperlipidemia, unspecified: Secondary | ICD-10-CM

## 2021-04-27 IMAGING — CR DG CHEST 2V
2 series · 2 of 2 positions shown · non-contrast
Comparison: 03/24/2018

CLINICAL DATA: 68-year-old male with a history of preoperative
chest x-ray

EXAM:
CHEST - 2 VIEW

[w chest pa]
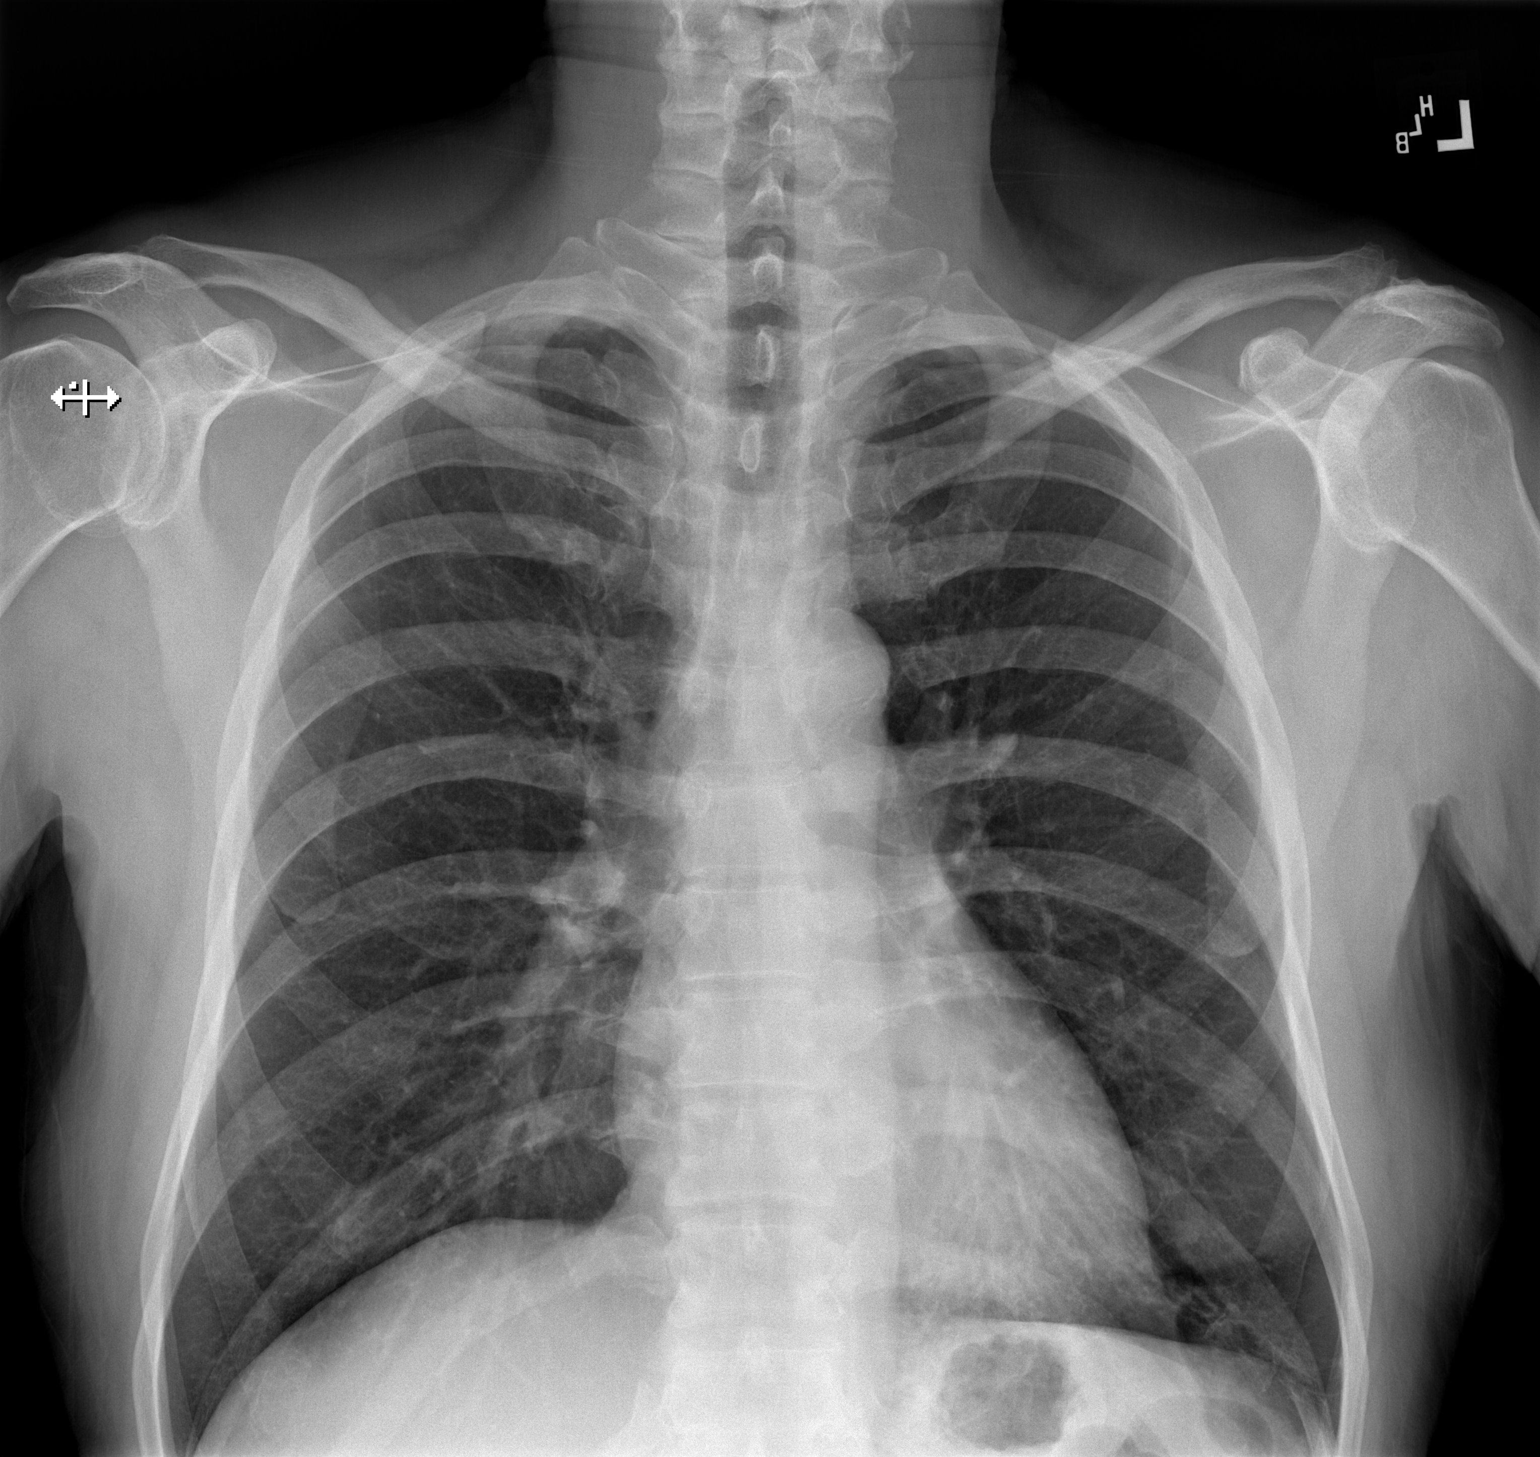

[w chest lat]
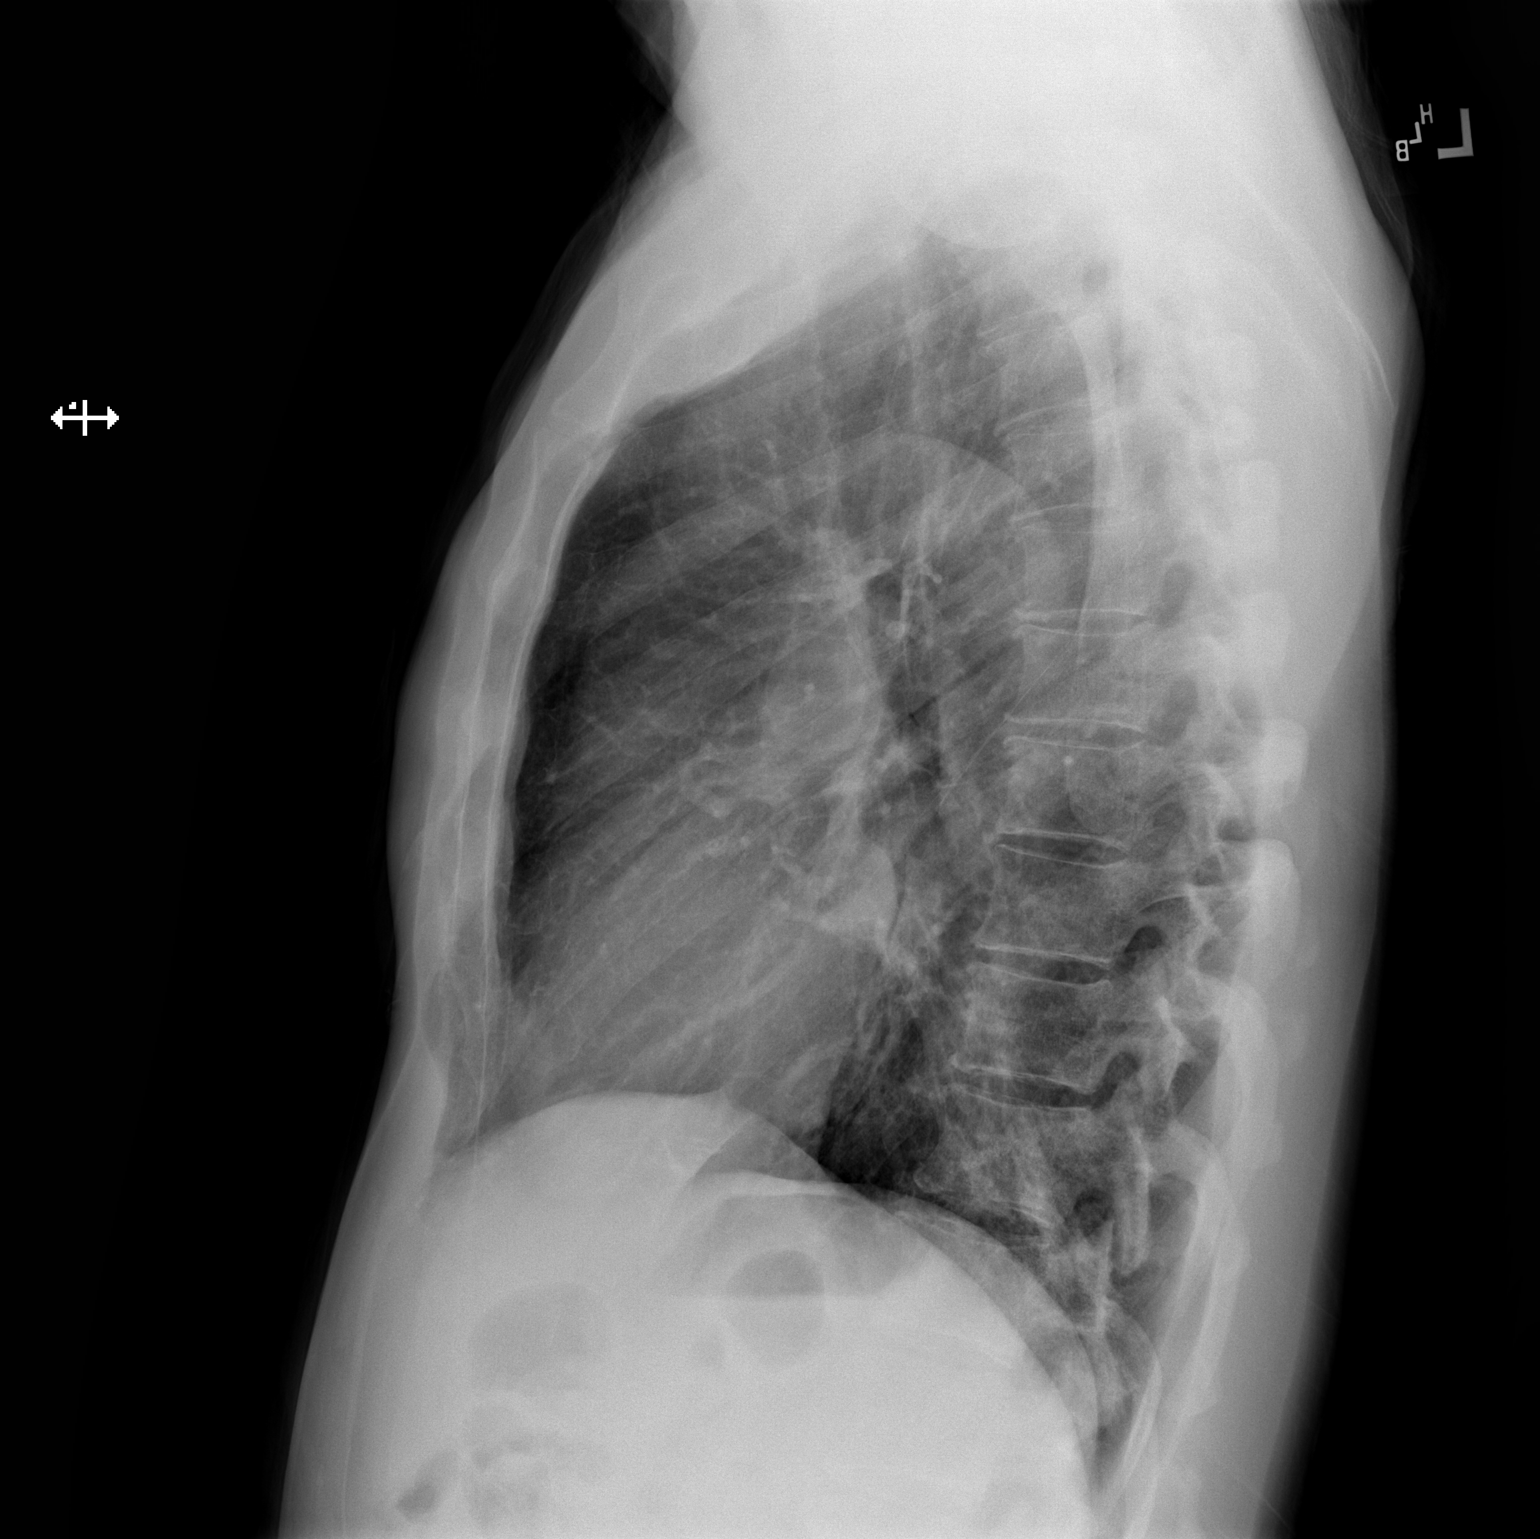

[2 of 2 positions shown; findings below may reference images not displayed]

FINDINGS: Cardiomediastinal silhouette unchanged in size and contour.

No pneumothorax or pleural effusion. No confluent airspace disease.
Mild coarsened interstitial markings similar to prior. No displaced
fracture.
IMPRESSION: Negative for acute cardiopulmonary disease

## 2021-05-11 ENCOUNTER — Ambulatory Visit (INDEPENDENT_AMBULATORY_CARE_PROVIDER_SITE_OTHER): Payer: Medicare HMO | Admitting: Family Medicine

## 2021-05-11 ENCOUNTER — Encounter: Payer: Self-pay | Admitting: Family Medicine

## 2021-05-11 ENCOUNTER — Other Ambulatory Visit: Payer: Self-pay

## 2021-05-11 VITALS — BP 139/73 | HR 70 | Temp 97.9°F | Ht 72.0 in | Wt 161.0 lb

## 2021-05-11 DIAGNOSIS — E291 Testicular hypofunction: Secondary | ICD-10-CM | POA: Diagnosis not present

## 2021-05-11 DIAGNOSIS — N1831 Chronic kidney disease, stage 3a: Secondary | ICD-10-CM

## 2021-05-11 DIAGNOSIS — J470 Bronchiectasis with acute lower respiratory infection: Secondary | ICD-10-CM | POA: Diagnosis not present

## 2021-05-11 DIAGNOSIS — E785 Hyperlipidemia, unspecified: Secondary | ICD-10-CM

## 2021-05-11 DIAGNOSIS — I10 Essential (primary) hypertension: Secondary | ICD-10-CM | POA: Diagnosis not present

## 2021-05-11 DIAGNOSIS — Z125 Encounter for screening for malignant neoplasm of prostate: Secondary | ICD-10-CM | POA: Diagnosis not present

## 2021-05-11 DIAGNOSIS — E0590 Thyrotoxicosis, unspecified without thyrotoxic crisis or storm: Secondary | ICD-10-CM

## 2021-05-11 DIAGNOSIS — Z23 Encounter for immunization: Secondary | ICD-10-CM

## 2021-05-11 DIAGNOSIS — Z0289 Encounter for other administrative examinations: Secondary | ICD-10-CM

## 2021-05-11 NOTE — Patient Instructions (Addendum)
Influenza (Flu) Vaccine (Inactivated or Recombinant): What You Need to Know 1. Why get vaccinated? Influenza vaccine can prevent influenza (flu). Flu is a contagious disease that spreads around the [place] every year, usually between October and May. Anyone can get the flu, but it is more dangerous for some people. Infants and young children, people 65 years and older, pregnant people, and people with certain health conditions or a weakened immune system are at greatest risk of flu complications. Pneumonia, bronchitis, sinus infections, and ear infections are examples of flu-related complications. If you have a medical condition, such as heart disease, cancer, or diabetes, flu can make it worse. Flu can cause fever and chills, sore throat, muscle aches, fatigue, cough, headache, and runny or stuffy nose. Some people may have vomiting and diarrhea, though this is more common in children than adults. In an average year, thousands of people in the [place] die from flu, and many more are hospitalized. Flu vaccine prevents millions of illnesses and flu-related visits to the doctor each year. 2. Influenza vaccines CDC recommends everyone 6 months and older get vaccinated every flu season. Children 6 months through 8 years of age may need 2 doses during a single flu season. Everyone else needs only 1 dose each flu season. It takes about 2 weeks for protection to develop after vaccination. There are many flu viruses, and they are always changing. Each year a new flu vaccine is made to protect against the influenza viruses believed to be likely to cause disease in the upcoming flu season. Even when the vaccine doesn't exactly match these viruses, it may still provide some protection. Influenza vaccine does not cause flu. Influenza vaccine may be given at the same time as other vaccines. 3. Talk with your health care provider Tell your vaccination provider if the person getting the vaccine: Has had  an allergic reaction after a previous dose of influenza vaccine, or has any severe, life-threatening allergies Has ever had Guillain-Barr Syndrome (also called "GBS") In some cases, your health care provider may decide to postpone influenza vaccination until a future visit. Influenza vaccine can be administered at any time during pregnancy. People who are or will be pregnant during influenza season should receive inactivated influenza vaccine. People with minor illnesses, such as a cold, may be vaccinated. People who are moderately or severely ill should usually wait until they recover before getting influenza vaccine. Your health care provider can give you more information. 4. Risks of a vaccine reaction Soreness, redness, and swelling where the shot is given, fever, muscle aches, and headache can happen after influenza vaccination. There may be a very small increased risk of Guillain-Barr Syndrome (GBS) after inactivated influenza vaccine (the flu shot). Young children who get the flu shot along with pneumococcal vaccine (PCV13) and/or DTaP vaccine at the same time might be slightly more likely to have a seizure caused by fever. Tell your health care provider if a child who is getting flu vaccine has ever had a seizure. People sometimes faint after medical procedures, including vaccination. Tell your provider if you feel dizzy or have vision changes or ringing in the ears. As with any medicine, there is a very remote chance of a vaccine causing a severe allergic reaction, other serious injury, or death. 5. What if there is a serious problem? An allergic reaction could occur after the vaccinated person leaves the clinic. If you see signs of a severe allergic reaction (hives, swelling of the face and throat, difficulty breathing,   a fast heartbeat, dizziness, or weakness), call 9-1-1 and get the person to the nearest hospital. For other signs that concern you, call your health care provider. Adverse  reactions should be reported to the Vaccine Adverse Event Reporting System (VAERS). Your health care provider will usually file this report, or you can do it yourself. Visit the VAERS website at www.vaers.hhs.gov or call 1-800-822-7967. VAERS is only for reporting reactions, and VAERS staff members do not give medical advice. 6. The National Vaccine Injury Compensation Program The National Vaccine Injury Compensation Program (VICP) is a federal program that was created to compensate people who may have been injured by certain vaccines. Claims regarding alleged injury or death due to vaccination have a time limit for filing, which may be as short as two years. Visit the VICP website at www.hrsa.gov/vaccinecompensation or call 1-800-338-2382 to learn about the program and about filing a claim. 7. How can I learn more? Ask your health care provider. Call your local or state health department. Visit the website of the Food and Drug Administration (FDA) for vaccine package inserts and additional information at www.fda.gov/vaccines-blood-biologics/vaccines. Contact the Centers for Disease Control and Prevention (CDC): Call 1-800-232-4636 (1-800-CDC-INFO) or Visit CDC's website at www.cdc.gov/flu. Vaccine Information Statement Inactivated Influenza Vaccine (03/14/2020) This information is not intended to replace advice given to you by your health care provider. Make sure you discuss any questions you have with your health care provider. Document Revised: 05/01/2020 Document Reviewed: 05/01/2020 Elsevier Patient Education  2022 Elsevier Inc.   Pneumococcal Polysaccharide Vaccine (PPSV23): What You Need to Know 1. Why get vaccinated? Pneumococcal polysaccharide vaccine (PPSV23) can prevent pneumococcal disease. Pneumococcal disease refers to any illness caused by pneumococcal bacteria. These bacteria can cause many types of illnesses, including pneumonia, which is an infection of the lungs. Pneumococcal  bacteria are one of the most common causes of pneumonia. Besides pneumonia, pneumococcal bacteria can also cause: Ear infections Sinus infections Meningitis (infection of the tissue covering the brain and spinal cord) Bacteremia (bloodstream infection) Anyone can get pneumococcal disease, but children under 2 years of age, people with certain medical conditions, adults 65 years or older, and cigarette smokers are at the highest risk. Most pneumococcal infections are mild. However, some can result in long-term problems, such as brain damage or hearing loss. Meningitis, bacteremia, and pneumonia caused by pneumococcal disease can be fatal. 2. PPSV23 PPSV23 protects against 23 types of bacteria that cause pneumococcal disease. PPSV23 is recommended for: All adults 65 years or older, Anyone 2 years or older with certain medical conditions that can lead to an increased risk for pneumococcal disease. Most people need only one dose of PPSV23. A second dose of PPSV23, and another type of pneumococcal vaccine called PCV13, are recommended for certain high-risk groups. Your health care provider can give you more information. People 65 years or older should get a dose of PPSV23 even if they have already gotten one or more doses of the vaccine before they turned 65. 3. Talk with your health care provider Tell your vaccine provider if the person getting the vaccine: Has had an allergic reaction after a previous dose of PPSV23, or has any severe, life-threatening allergies. In some cases, your health care provider may decide to postpone PPSV23 vaccination to a future visit. People with minor illnesses, such as a cold, may be vaccinated. People who are moderately or severely ill should usually wait until they recover before getting PPSV23. Your health care provider can give you more information. 4. Risks   of a vaccine reaction Redness or pain where the shot is given, feeling tired, fever, or muscle aches can  happen after PPSV23. People sometimes faint after medical procedures, including vaccination. Tell your provider if you feel dizzy or have vision changes or ringing in the ears. As with any medicine, there is a very remote chance of a vaccine causing a severe allergic reaction, other serious injury, or death. 5. What if there is a serious problem? An allergic reaction could occur after the vaccinated person leaves the clinic. If you see signs of a severe allergic reaction (hives, swelling of the face and throat, difficulty breathing, a fast heartbeat, dizziness, or weakness), call 9-1-1 and get the person to the nearest hospital. For other signs that concern you, call your health care provider. Adverse reactions should be reported to the Vaccine Adverse Event Reporting System (VAERS). Your health care provider will usually file this report, or you can do it yourself. Visit the VAERS website at www.vaers.hhs.gov or call 1-800-822-7967. VAERS is only for reporting reactions, and VAERS staff do not give medical advice. 6. How can I learn more? Ask your health care provider. Call your local or state health department. Contact the Centers for Disease Control and Prevention (CDC): Call 1-800-232-4636 (1-800-CDC-INFO) or Visit CDC's website at www.cdc.gov/vaccines Vaccine Information Statement PPSV23 Vaccine (06/07/2018) This information is not intended to replace advice given to you by your health care provider. Make sure you discuss any questions you have with your health care provider. Document Revised: 03/28/2020 Document Reviewed: 03/28/2020 Elsevier Patient Education  2022 Elsevier Inc.  

## 2021-05-11 NOTE — Assessment & Plan Note (Signed)
Need to monitor renal function every 6 months.

## 2021-05-11 NOTE — Assessment & Plan Note (Signed)
Due to recheck PSA, CBC and testosterone levels.

## 2021-05-11 NOTE — Assessment & Plan Note (Signed)
Pressure slightly elevated today we will continue to monitor carefully normally it is a little bit better than this.

## 2021-05-11 NOTE — Progress Notes (Signed)
Established Patient Office Visit  Subjective:  Patient ID: Victor Price, male    DOB: 09-04-1950  Age: 70 y.o. MRN: [id number]  CC:  Chief Complaint  Patient presents with   Hypertension   Hyperlipidemia   Hypothyroidism   Form Completion    HPI Victor Price presents for    Hypertension- Pt denies chest pain, SOB, dizziness, or heart palpitations.  Taking meds as directed w/o problems.  Denies medication side effects.    Hyperlipidemia - tolerating stating well with no myalgias or significant side effects.  Lab Results  Component Value Date   CHOL 129 05/01/2020   HDL 56 05/01/2020   LDLCALC 51 05/01/2020   TRIG 134 05/01/2020   CHOLHDL 2.3 05/01/2020   Also brought a form in for completion today to go to the gym to workout.  In regards to his lungs he is actually feeling a whole lot better.  He recently started an enzyme called serrapeptase and says he is actually been able to quit taking his Mucinex he got ill several months ago and just had this residual cough that was left over since then.  He has a history of bronchiectasis.  He did well let me know about an episode recently where he was walking across the field to his grandson soccer game and suddenly felt his right leg turn outward.  He said it was difficult to walk back to his chair but it resolved pretty quickly he did not have an injury or pain.  He said around that same time he saw a line in the right side of his vision most like an eyelash in his vision but he knew it was not an eyelash again this was temporary as well.  He does have a history of floaters but this 1 felt different.   Past Medical History:  Diagnosis Date   BPH (benign prostatic hyperplasia)    Bronchiectasis with acute lower respiratory infection ([place]) 08/13/2019   Bronchitis    inhaler prn   Cancer (HCC)    skin cancer?   ED (erectile dysfunction)    GERD (gastroesophageal reflux disease)    HLD (hyperlipidemia)    Hypertension     Pneumonia    gets recurrent PNA in left lung, recent dx 08/13/19 left lower lobe   Seasonal allergies     Past Surgical History:  Procedure Laterality Date   BACK SURGERY  1980s   [facility]   COLONOSCOPY  2004, 2012   several - polyps   MULTIPLE TOOTH EXTRACTIONS     TOTAL HIP ARTHROPLASTY Left 08/24/2019   Procedure: LEFT TOTAL HIP ARTHROPLASTY-DIRECT ANTERIOR;  Surgeon: Marybelle Killings, MD;  Location: [facility];  Service: Orthopedics;  Laterality: Left;    History reviewed. No pertinent family history.  Social History   Socioeconomic History   Marital status: Soil scientist    Spouse name: Not on file   Number of children: 0   Years of education: 14   Highest education level: High school graduate  Occupational History   Occupation: [employer]    Employer: [employer]: Retired    Comment: Part-time  Tobacco Use   Smoking status: Never   Smokeless tobacco: Never  Vaping Use   Vaping Use: Never used  Substance and Sexual Activity   Alcohol use: No   Drug use: No   Sexual activity: Yes  Other Topics Concern   Not on file  Social History Narrative   Lives  with his domestic partner and their family. Works part-time. 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   Social Determinants of Health   Financial Resource Strain: Low Risk    Difficulty of Paying Living Expenses: Not hard at all  Food Insecurity: No Food Insecurity   Worried About Charity fundraiser in the Last Year: Never true   [place] in the Last Year: Never true  Transportation Needs: No Transportation Needs   Lack of Transportation (Medical): No   Lack of Transportation (Non-Medical): No  Physical Activity: Sufficiently Active   Days of Exercise per Week: 7 days   Minutes of Exercise per Session: 60 min  Stress: No Stress Concern Present   Feeling of Stress : Not at all  Social Connections: Moderately Integrated   Frequency of Communication with Friends and Family: More than three times a week    Frequency of Social Gatherings with Friends and Family: Twice a week   Attends Religious Services: More than 4 times per year   Active Member of [employer] or Organizations: No   Attends Music therapist: Never   Marital Status: Living with partner  Intimate Partner Violence: Not At Risk   Fear of Current or Ex-Partner: No   Emotionally Abused: No   Physically Abused: No   Sexually Abused: No    Outpatient Medications Prior to Visit  Medication Sig Dispense Refill   albuterol (VENTOLIN HFA) 108 (90 Base) MCG/ACT inhaler Inhale 2 puffs into the lungs every 6 (six) hours as needed for wheezing or shortness of breath. 18 g prn   atorvastatin (LIPITOR) 40 MG tablet TAKE 1 TABLET BY MOUTH AT BEDTIME 90 tablet 3   benzonatate (TESSALON) 200 MG capsule TAKE 1 CAPSULE (200 MG TOTAL) BY MOUTH AT BEDTIME AS NEEDED FOR COUGH. 30 capsule 2   bisoprolol-hydrochlorothiazide (ZIAC) 10-6.25 MG tablet TAKE 1 TABLET BY MOUTH DAILY. **PATIENT NEEDS OFFICE VISIT FOR ADDITIONAL REFILLS** 15 tablet 0   clindamycin (CLEOCIN T) 1 % external solution 1(ONE) APPLICATION(S) TOPICAL 2(TWO) TIMES A DAY     [facility] ALLERGY RELIEF-D 10-240 MG 24 hr tablet TAKE 1 TABLET BY MOUTH EVERY DAY 30 tablet 11   doxycycline (VIBRA-TABS) 100 MG tablet Take 1 tablet (100 mg total) by mouth 2 (two) times daily. 20 tablet 0   Lidocaine-Hydrocortisone Ace (ANAMANTLE HC) 3-0.5 % KIT Apply 1 dose rectally up to  Bid prn for spasm. 1 kit PRN   mupirocin ointment (BACTROBAN) 2 % Apply topically 3 (three) times daily. 30 g prn   pantoprazole (PROTONIX) 40 MG tablet TAKE 1 TABLET BY MOUTH EVERY DAY 90 tablet 3   predniSONE (DELTASONE) 20 MG tablet Take 2 tablets (40 mg total) by mouth daily with breakfast. 10 tablet 0   sildenafil (REVATIO) 20 MG tablet TAKE 2-5 TABLETS (40-100 MG TOTAL) BY MOUTH DAILY AS NEEDED. 40 tablet 5   tamsulosin (FLOMAX) 0.4 MG CAPS capsule TAKE 1 CAPSULE BY MOUTH EVERY DAY AFTER SUPPER 90 capsule 1    terbinafine (LAMISIL) 250 MG tablet TAKE 1 TABLET BY MOUTH EVERY DAY 90 tablet 1   Testosterone 10 MG/ACT (2%) GEL APPLY 1 PUMP TO AN INNER THIGH AND 2 PUMPS TO OPPOSITE INNER THIGH IN AM. Harlene Salts. 120 g 2   traZODone (DESYREL) 50 MG tablet TAKE 1/2 TO 2 TABLETS BY MOUTH AT BEDTIME AS NEEDED SLEEP 180 tablet 3   No facility-administered medications prior to visit.    No Known Allergies  ROS  Review of Systems    Objective:    Physical Exam Constitutional:      Appearance: Normal appearance. He is well-developed.  HENT:     Head: Normocephalic and atraumatic.  Cardiovascular:     Rate and Rhythm: Normal rate and regular rhythm.     Heart sounds: Normal heart sounds.  Pulmonary:     Effort: Pulmonary effort is normal.     Breath sounds: Normal breath sounds.  Skin:    General: Skin is warm and dry.  Neurological:     Mental Status: He is alert and oriented to person, place, and time. Mental status is at baseline.  Psychiatric:        Behavior: Behavior normal.    BP 139/73   Pulse 70   Temp 97.9 F (36.6 C)   Ht 6' (1.829 m)   Wt 161 lb (73 kg)   SpO2 100%   BMI 21.84 kg/m  Wt Readings from Last 3 Encounters:  05/11/21 161 lb (73 kg)  02/20/21 164 lb (74.4 kg)  09/10/20 172 lb (78 kg)     There are no preventive care reminders to display for this patient.   There are no preventive care reminders to display for this patient.  Lab Results  Component Value Date   TSH 0.11 (L) 10/20/2017   Lab Results  Component Value Date   WBC 6.6 05/01/2020   HGB 15.0 05/01/2020   HCT 45.0 05/01/2020   MCV 92.8 05/01/2020   PLT 212 05/01/2020   Lab Results  Component Value Date   NA 140 10/14/2020   K 4.1 10/14/2020   CO2 30 10/14/2020   GLUCOSE 100 (H) 10/14/2020   BUN 18 10/14/2020   CREATININE 1.41 (H) 10/14/2020   BILITOT 0.5 05/01/2020   ALKPHOS 75 08/15/2019   AST 16 05/01/2020   ALT 16 05/01/2020   PROT 6.8 05/01/2020   ALBUMIN 3.8 08/15/2019    CALCIUM 9.6 10/14/2020   ANIONGAP 8 08/15/2019   Lab Results  Component Value Date   CHOL 129 05/01/2020   Lab Results  Component Value Date   HDL 56 05/01/2020   Lab Results  Component Value Date   LDLCALC 51 05/01/2020   Lab Results  Component Value Date   TRIG 134 05/01/2020   Lab Results  Component Value Date   CHOLHDL 2.3 05/01/2020   No results found for: HGBA1C    Assessment & Plan:   Problem List Items Addressed This Visit       Cardiovascular and Mediastinum   HYPERTENSION, BENIGN SYSTEMIC - Primary    Pressure slightly elevated today we will continue to monitor carefully normally it is a little bit better than this.      Relevant Orders   Lipid panel   COMPLETE METABOLIC PANEL WITH GFR   CBC     Respiratory   Bronchiectasis ([place])    He is feeling much better now taking a supplemental enzyme.        Endocrine   Subclinical hyperthyroidism    Plan to recheck thyroid level.      Relevant Orders   TSH   Hypogonadism in male    Due to recheck PSA, CBC and testosterone levels.      Relevant Orders   Testosterone Total,Free,Bio, Males     Genitourinary   CKD (chronic kidney disease) stage 3, GFR 30-59 ml/min (HCC)    Need to monitor renal function every 6 months.  Other   Hyperlipidemia    Continue atorvastatin daily.  Due to recheck lipids.      Relevant Orders   Lipid panel   COMPLETE METABOLIC PANEL WITH GFR   CBC   Other Visit Diagnoses     Screening for prostate cancer       Relevant Orders   PSA   Need for influenza vaccination       Relevant Orders   Flu Vaccine QUAD High Dose(Fluad) (Completed)   Encounter for completion of form with patient       Need for pneumococcal vaccination       Relevant Orders   Pneumococcal conjugate vaccine 20-valent (Completed)       Completed form for unrestricted activity level at the gym.  He has been working with a Clinical research associate which I think is fantastic.  Because of the visual  issue that occurred even though it was brief I did encourage her to schedule an eye appointment he has not had 1 in years.  As far as the right leg turning out I am not clear what that could have been consider underlying etiology such as stroke but for his leg on the right side and either be affected the same time would be highly unusual and would had to have affected to different parts of his brain.  If it occurs again then please let me know.  Prevnar 20 given today.  No orders of the defined types were placed in this encounter.   Follow-up: Return in about 6 months (around 11/09/2021) for Hypertension, testosterone,e tc .    Beatrice Lecher, MD

## 2021-05-11 NOTE — Assessment & Plan Note (Signed)
Continue atorvastatin daily.  Due to recheck lipids.

## 2021-05-11 NOTE — Assessment & Plan Note (Signed)
Plan to recheck thyroid level.

## 2021-05-11 NOTE — Assessment & Plan Note (Signed)
He is feeling much better now taking a supplemental enzyme.

## 2021-05-14 DIAGNOSIS — I10 Essential (primary) hypertension: Secondary | ICD-10-CM | POA: Diagnosis not present

## 2021-05-14 DIAGNOSIS — E785 Hyperlipidemia, unspecified: Secondary | ICD-10-CM | POA: Diagnosis not present

## 2021-05-14 DIAGNOSIS — E0590 Thyrotoxicosis, unspecified without thyrotoxic crisis or storm: Secondary | ICD-10-CM | POA: Diagnosis not present

## 2021-05-14 DIAGNOSIS — E291 Testicular hypofunction: Secondary | ICD-10-CM | POA: Diagnosis not present

## 2021-05-14 DIAGNOSIS — Z125 Encounter for screening for malignant neoplasm of prostate: Secondary | ICD-10-CM | POA: Diagnosis not present

## 2021-05-15 ENCOUNTER — Other Ambulatory Visit: Payer: Self-pay | Admitting: *Deleted

## 2021-05-15 DIAGNOSIS — R7989 Other specified abnormal findings of blood chemistry: Secondary | ICD-10-CM

## 2021-05-15 LAB — LIPID PANEL
Cholesterol: 141 mg/dL (ref <200)
HDL: 73 mg/dL (ref >=40)
LDL Cholesterol (Calc): 54 mg/dL (calc)
Non-HDL Cholesterol (Calc): 68 mg/dL (calc) (ref <130)
Total CHOL/HDL Ratio: 1.9 (calc) (ref <5.0)
Triglycerides: 67 mg/dL (ref <150)

## 2021-05-15 LAB — COMPLETE METABOLIC PANEL WITH GFR
AG Ratio: 1.4 (calc) (ref 1.0–2.5)
ALT: 30 U/L (ref 9–46)
AST: 26 U/L (ref 10–35)
Albumin: 3.8 g/dL (ref 3.6–5.1)
Alkaline phosphatase (APISO): 93 U/L (ref 35–144)
BUN/Creatinine Ratio: 8 (calc) (ref 6–22)
BUN: 14 mg/dL (ref 7–25)
CO2: 27 mmol/L (ref 20–32)
Calcium: 9.5 mg/dL (ref 8.6–10.3)
Chloride: 99 mmol/L (ref 98–110)
Creat: 1.75 mg/dL — ABNORMAL HIGH (ref 0.70–1.35)
Globulin: 2.7 g/dL (calc) (ref 1.9–3.7)
Glucose, Bld: 89 mg/dL (ref 65–99)
Potassium: 5 mmol/L (ref 3.5–5.3)
Sodium: 136 mmol/L (ref 135–146)
Total Bilirubin: 0.8 mg/dL (ref 0.2–1.2)
Total Protein: 6.5 g/dL (ref 6.1–8.1)
eGFR: 42 mL/min/{1.73_m2} — ABNORMAL LOW (ref >=60)

## 2021-05-15 LAB — CBC
HCT: 46.2 % (ref 38.5–50.0)
Hemoglobin: 15.5 g/dL (ref 13.2–17.1)
MCH: 31.8 pg (ref 27.0–33.0)
MCHC: 33.5 g/dL (ref 32.0–36.0)
MCV: 94.7 fL (ref 80.0–100.0)
MPV: 9.7 fL (ref 7.5–12.5)
Platelets: 238 10*3/uL (ref 140–400)
RBC: 4.88 10*6/uL (ref 4.20–5.80)
RDW: 13.1 % (ref 11.0–15.0)
WBC: 7.4 10*3/uL (ref 3.8–10.8)

## 2021-05-15 LAB — TESTOSTERONE TOTAL,FREE,BIO, MALES
Albumin: 3.8 g/dL (ref 3.6–5.1)
Sex Hormone Binding: 46 nmol/L (ref 22–77)
Testosterone, Bioavailable: 121.8 ng/dL (ref 110.0–575.0)
Testosterone, Free: 69.5 pg/mL (ref 46.0–224.0)
Testosterone: 637 ng/dL (ref 250–827)

## 2021-05-15 LAB — TSH: TSH: 0.77 mIU/L (ref 0.40–4.50)

## 2021-05-15 LAB — PSA: PSA: 1.74 ng/mL (ref <=4.00)

## 2021-05-15 NOTE — Progress Notes (Signed)
Hi [place].  Prostate test is normal.  Cholesterol looks great.  Kidney function is up a little bit normally around 1.4 it jumped up to 1.7 this time I do want a keep a little bit closer eye on it just to make sure that its not a trend.  I would like to recheck it again in 2 to 3 months instead of 6 months.  Kidney function looks great and blood count is normal.  Thyroid looks great as well.

## 2021-05-19 ENCOUNTER — Other Ambulatory Visit: Payer: Self-pay | Admitting: Family Medicine

## 2021-05-19 DIAGNOSIS — N529 Male erectile dysfunction, unspecified: Secondary | ICD-10-CM

## 2021-05-19 DIAGNOSIS — E785 Hyperlipidemia, unspecified: Secondary | ICD-10-CM

## 2021-05-19 DIAGNOSIS — I10 Essential (primary) hypertension: Secondary | ICD-10-CM

## 2021-05-29 ENCOUNTER — Other Ambulatory Visit: Payer: Self-pay | Admitting: Family Medicine

## 2021-05-29 DIAGNOSIS — R351 Nocturia: Secondary | ICD-10-CM

## 2021-06-22 DIAGNOSIS — H52223 Regular astigmatism, bilateral: Secondary | ICD-10-CM | POA: Diagnosis not present

## 2021-06-28 ENCOUNTER — Other Ambulatory Visit: Payer: Self-pay | Admitting: Family Medicine

## 2021-06-28 DIAGNOSIS — I10 Essential (primary) hypertension: Secondary | ICD-10-CM

## 2021-06-28 DIAGNOSIS — E785 Hyperlipidemia, unspecified: Secondary | ICD-10-CM

## 2021-08-23 ENCOUNTER — Other Ambulatory Visit: Payer: Self-pay | Admitting: Family Medicine

## 2021-08-24 ENCOUNTER — Other Ambulatory Visit: Payer: Self-pay | Admitting: Family Medicine

## 2021-08-26 DIAGNOSIS — I10 Essential (primary) hypertension: Secondary | ICD-10-CM | POA: Diagnosis not present

## 2021-08-26 DIAGNOSIS — N400 Enlarged prostate without lower urinary tract symptoms: Secondary | ICD-10-CM | POA: Diagnosis not present

## 2021-08-26 DIAGNOSIS — E785 Hyperlipidemia, unspecified: Secondary | ICD-10-CM | POA: Diagnosis not present

## 2021-08-26 DIAGNOSIS — Z008 Encounter for other general examination: Secondary | ICD-10-CM | POA: Diagnosis not present

## 2021-08-26 DIAGNOSIS — G629 Polyneuropathy, unspecified: Secondary | ICD-10-CM | POA: Diagnosis not present

## 2021-08-26 DIAGNOSIS — Z7982 Long term (current) use of aspirin: Secondary | ICD-10-CM | POA: Diagnosis not present

## 2021-08-26 DIAGNOSIS — K2100 Gastro-esophageal reflux disease with esophagitis, without bleeding: Secondary | ICD-10-CM | POA: Diagnosis not present

## 2021-08-26 DIAGNOSIS — E0590 Thyrotoxicosis, unspecified without thyrotoxic crisis or storm: Secondary | ICD-10-CM | POA: Diagnosis not present

## 2021-08-26 DIAGNOSIS — N529 Male erectile dysfunction, unspecified: Secondary | ICD-10-CM | POA: Diagnosis not present

## 2021-08-26 DIAGNOSIS — B353 Tinea pedis: Secondary | ICD-10-CM | POA: Diagnosis not present

## 2021-08-26 DIAGNOSIS — G4700 Insomnia, unspecified: Secondary | ICD-10-CM | POA: Diagnosis not present

## 2021-08-30 ENCOUNTER — Other Ambulatory Visit: Payer: Self-pay | Admitting: Family Medicine

## 2021-08-30 DIAGNOSIS — R351 Nocturia: Secondary | ICD-10-CM

## 2021-09-02 ENCOUNTER — Other Ambulatory Visit: Payer: Self-pay | Admitting: Family Medicine

## 2021-09-02 DIAGNOSIS — E785 Hyperlipidemia, unspecified: Secondary | ICD-10-CM

## 2021-09-02 DIAGNOSIS — I10 Essential (primary) hypertension: Secondary | ICD-10-CM

## 2021-09-24 ENCOUNTER — Other Ambulatory Visit: Payer: Self-pay

## 2021-09-24 ENCOUNTER — Ambulatory Visit (INDEPENDENT_AMBULATORY_CARE_PROVIDER_SITE_OTHER): Payer: Medicare HMO | Admitting: Family Medicine

## 2021-09-24 ENCOUNTER — Encounter: Payer: Self-pay | Admitting: Family Medicine

## 2021-09-24 VITALS — BP 171/105 | HR 84 | Resp 16 | Ht 72.0 in | Wt 162.0 lb

## 2021-09-24 DIAGNOSIS — I10 Essential (primary) hypertension: Secondary | ICD-10-CM

## 2021-09-24 DIAGNOSIS — N400 Enlarged prostate without lower urinary tract symptoms: Secondary | ICD-10-CM | POA: Diagnosis not present

## 2021-09-24 DIAGNOSIS — R351 Nocturia: Secondary | ICD-10-CM | POA: Diagnosis not present

## 2021-09-24 DIAGNOSIS — E785 Hyperlipidemia, unspecified: Secondary | ICD-10-CM | POA: Diagnosis not present

## 2021-09-24 MED ORDER — TAMSULOSIN HCL 0.4 MG PO CAPS: 0.4000 mg | ORAL_CAPSULE | Freq: Two times a day (BID) | ORAL | 0 refills | Status: DC

## 2021-09-24 MED ORDER — DOXAZOSIN MESYLATE 1 MG PO TABS: 1.0000 mg | ORAL_TABLET | Freq: Every day | ORAL | 0 refills | Status: DC

## 2021-09-24 MED ORDER — BISOPROLOL-HYDROCHLOROTHIAZIDE 10-6.25 MG PO TABS: 1.0000 | ORAL_TABLET | Freq: Every day | ORAL | 3 refills | Status: DC

## 2021-09-24 NOTE — Assessment & Plan Note (Signed)
Discussed options.  At this point recommend the addition of an alpha-blocker for treatment.  We will temporarily keep the Flomax to twice a day since he does feel like it is helpful but hopefully as the doxazosin starts to work he can go back down to once a day.  He is good to complete an AUA questionnaire for me tomorrow and we can better determine if we might need to send him to urology.

## 2021-09-24 NOTE — Progress Notes (Signed)
Established Patient Office Visit  Subjective:  Patient ID: Victor Price, male    DOB: May 03, 1951  Age: 71 y.o. MRN: [id number]  CC:  Chief Complaint  Patient presents with   Labs   Hypertension    Follow up    Discuss Medication    Patient would like to discuss increasing dose on Flomax. Patient currently taking 2 tablets daily.    Annual Exam    HPI Victor Price presents for   Hypertension-when I last saw him in the fall about 4 months ago his blood pressure was elevated.  He says he has checked it a few times at home and if he lays down it is usually good.  But this has been a stressful week he has been taking care of a small grandchild and says he barely slept last night.  Bronchiectasis-overall he reports he is doing well he is still taking a supplemental enzyme and feels like its been helpful.  Elevated serum creatinine-last time we did his labs his creatinine was elevated little above his baseline.  He has been taking supplemental creatine for his workouts twice a day.  He has stopped that since then.  He also reports that for the last several months he has actually been taking his Flomax twice a day he noticed he was having a little bit more difficulty urinating so he bumped it up to twice a day and noticed that it did seem to help his urinary symptoms.  He has known history of BPH.  Last PSA in the fall was normal.  Lab Results  Component Value Date   PSA 1.74 05/14/2021   PSA 2.61 05/01/2020   PSA 3.2 10/10/2019      Past Medical History:  Diagnosis Date   BPH (benign prostatic hyperplasia)    Bronchiectasis with acute lower respiratory infection ([place]) 08/13/2019   Bronchitis    inhaler prn   Cancer (HCC)    skin cancer?   ED (erectile dysfunction)    GERD (gastroesophageal reflux disease)    HLD (hyperlipidemia)    Hypertension    Pneumonia    gets recurrent PNA in left lung, recent dx 08/13/19 left lower lobe   Seasonal allergies     Past Surgical  History:  Procedure Laterality Date   BACK SURGERY  1980s   [facility]   COLONOSCOPY  2004, 2012   several - polyps   MULTIPLE TOOTH EXTRACTIONS     TOTAL HIP ARTHROPLASTY Left 08/24/2019   Procedure: LEFT TOTAL HIP ARTHROPLASTY-DIRECT ANTERIOR;  Surgeon: Marybelle Killings, MD;  Location: [facility];  Service: Orthopedics;  Laterality: Left;    No family history on file.  Social History   Socioeconomic History   Marital status: Soil scientist    Spouse name: Not on file   Number of children: 0   Years of education: 14   Highest education level: High school graduate  Occupational History   Occupation: [employer]    Employer: [employer]: Retired    Comment: Part-time  Tobacco Use   Smoking status: Never   Smokeless tobacco: Never  Vaping Use   Vaping Use: Never used  Substance and Sexual Activity   Alcohol use: No   Drug use: No   Sexual activity: Yes  Other Topics Concern   Not on file  Social History Narrative   Lives with his domestic partner and their family. Works part-time. 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  Social Determinants of Health   Financial Resource Strain: Low Risk    Difficulty of Paying Living Expenses: Not hard at all  Food Insecurity: No Food Insecurity   Worried About Charity fundraiser in the Last Year: Never true   [place] in the Last Year: Never true  Transportation Needs: No Transportation Needs   Lack of Transportation (Medical): No   Lack of Transportation (Non-Medical): No  Physical Activity: Sufficiently Active   Days of Exercise per Week: 7 days   Minutes of Exercise per Session: 60 min  Stress: No Stress Concern Present   Feeling of Stress : Not at all  Social Connections: Moderately Integrated   Frequency of Communication with Friends and Family: More than three times a week   Frequency of Social Gatherings with Friends and Family: Twice a week   Attends Religious Services: More than 4 times per year   Active  Member of [employer] or Organizations: No   Attends Music therapist: Never   Marital Status: Living with partner  Intimate Partner Violence: Not At Risk   Fear of Current or Ex-Partner: No   Emotionally Abused: No   Physically Abused: No   Sexually Abused: No    Outpatient Medications Prior to Visit  Medication Sig Dispense Refill   albuterol (VENTOLIN HFA) 108 (90 Base) MCG/ACT inhaler Inhale 2 puffs into the lungs every 6 (six) hours as needed for wheezing or shortness of breath. 18 g prn   atorvastatin (LIPITOR) 40 MG tablet TAKE 1 TABLET BY MOUTH AT BEDTIME 90 tablet 3   benzonatate (TESSALON) 200 MG capsule TAKE 1 CAPSULE (200 MG TOTAL) BY MOUTH AT BEDTIME AS NEEDED FOR COUGH. 30 capsule 2   clindamycin (CLEOCIN T) 1 % external solution 1(ONE) APPLICATION(S) TOPICAL 2(TWO) TIMES A DAY     [facility] ALLERGY RELIEF-D 10-240 MG 24 hr tablet TAKE 1 TABLET BY MOUTH EVERY DAY 30 tablet 10   doxycycline (VIBRA-TABS) 100 MG tablet Take 1 tablet (100 mg total) by mouth 2 (two) times daily. 20 tablet 0   Lidocaine-Hydrocortisone Ace (ANAMANTLE HC) 3-0.5 % KIT Apply 1 dose rectally up to  Bid prn for spasm. 1 kit PRN   mupirocin ointment (BACTROBAN) 2 % Apply topically 3 (three) times daily. 30 g prn   pantoprazole (PROTONIX) 40 MG tablet TAKE 1 TABLET BY MOUTH EVERY DAY 90 tablet 3   sildenafil (REVATIO) 20 MG tablet TAKE TWO TO FIVE TABLETS BY MOUTH DAILY AS NEEDED 40 tablet 5   terbinafine (LAMISIL) 250 MG tablet TAKE 1 TABLET BY MOUTH EVERY DAY 90 tablet 1   Testosterone 10 MG/ACT (2%) GEL APPLY 1 PUMP TO AN INNER THIGH AND 2 PUMPS TO OPPOSITE INNER THIGH IN AM. Harlene Salts. 120 g 2   traZODone (DESYREL) 50 MG tablet TAKE 1/2 TO 2 TABLETS BY MOUTH AT BEDTIME AS NEEDED SLEEP 180 tablet 3   bisoprolol-hydrochlorothiazide (ZIAC) 10-6.25 MG tablet TAKE 1 TABLET BY MOUTH DAILY. **PATIENT NEEDS OFFICE VISIT FOR ADDITIONAL REFILLS** 30 tablet 0   predniSONE (DELTASONE) 20 MG tablet Take 2 tablets  (40 mg total) by mouth daily with breakfast. 10 tablet 0   tamsulosin (FLOMAX) 0.4 MG CAPS capsule TAKE 1 CAPSULE BY MOUTH EVERY DAY AFTER SUPPER (Patient taking differently: 0.4 mg in the morning and at bedtime. Patient currently taking twice a day) 90 capsule 1   No facility-administered medications prior to visit.    No Known Allergies  ROS Review of Systems  Objective:    Physical Exam Constitutional:      Appearance: Normal appearance. He is well-developed.  HENT:     Head: Normocephalic and atraumatic.  Cardiovascular:     Rate and Rhythm: Normal rate and regular rhythm.     Heart sounds: Normal heart sounds.  Pulmonary:     Effort: Pulmonary effort is normal.     Breath sounds: Normal breath sounds.  Skin:    General: Skin is warm and dry.  Neurological:     Mental Status: He is alert and oriented to person, place, and time. Mental status is at baseline.  Psychiatric:        Behavior: Behavior normal.    BP (!) 171/105    Pulse 84    Resp 16    Ht 6' (1.829 m)    Wt 162 lb (73.5 kg)    SpO2 97%    BMI 21.97 kg/m  Wt Readings from Last 3 Encounters:  09/24/21 162 lb (73.5 kg)  05/11/21 161 lb (73 kg)  02/20/21 164 lb (74.4 kg)     Health Maintenance Due  Topic Date Due   COVID-19 Vaccine (3 - Booster for Pfizer series) 12/17/2019   COLONOSCOPY (Pts 45-59yr Insurance coverage will need to be confirmed)  06/23/2021    There are no preventive care reminders to display for this patient.  Lab Results  Component Value Date   TSH 0.77 05/14/2021   Lab Results  Component Value Date   WBC 7.4 05/14/2021   HGB 15.5 05/14/2021   HCT 46.2 05/14/2021   MCV 94.7 05/14/2021   PLT 238 05/14/2021   Lab Results  Component Value Date   NA 136 05/14/2021   K 5.0 05/14/2021   CO2 27 05/14/2021   GLUCOSE 89 05/14/2021   BUN 14 05/14/2021   CREATININE 1.75 (H) 05/14/2021   BILITOT 0.8 05/14/2021   ALKPHOS 75 08/15/2019   AST 26 05/14/2021   ALT 30 05/14/2021    PROT 6.5 05/14/2021   ALBUMIN 3.8 08/15/2019   CALCIUM 9.5 05/14/2021   ANIONGAP 8 08/15/2019   EGFR 42 (L) 05/14/2021   Lab Results  Component Value Date   CHOL 141 05/14/2021   Lab Results  Component Value Date   HDL 73 05/14/2021   Lab Results  Component Value Date   LDLCALC 54 05/14/2021   Lab Results  Component Value Date   TRIG 67 05/14/2021   Lab Results  Component Value Date   CHOLHDL 1.9 05/14/2021   No results found for: HGBA1C    Assessment & Plan:   Problem List Items Addressed This Visit       Cardiovascular and Mediastinum   HYPERTENSION, BENIGN SYSTEMIC    Pressure was still significantly elevated today he feels like it really is better at home so we discussed making sure that he is sitting upright for at least 5 minutes and checking his blood pressure at home and then coming back here in a couple of weeks to have it rechecked.  If it still not at goal then we definitely need to adjust his medication in the meantime I will go ahead and send a refill to the pharmacy.      Relevant Medications   bisoprolol-hydrochlorothiazide (ZIAC) 10-6.25 MG tablet   doxazosin (CARDURA) 1 MG tablet   Other Relevant Orders   BASIC METABOLIC PANEL WITH GFR     Genitourinary   BPH (benign prostatic hyperplasia) - Primary    Discussed options.  At this point recommend the addition of an alpha-blocker for treatment.  We will temporarily keep the Flomax to twice a day since he does feel like it is helpful but hopefully as the doxazosin starts to work he can go back down to once a day.  He is good to complete an AUA questionnaire for me tomorrow and we can better determine if we might need to send him to urology.      Relevant Medications   tamsulosin (FLOMAX) 0.4 MG CAPS capsule   doxazosin (CARDURA) 1 MG tablet     Other   Hyperlipidemia   Relevant Medications   bisoprolol-hydrochlorothiazide (ZIAC) 10-6.25 MG tablet   doxazosin (CARDURA) 1 MG tablet   Other  Visit Diagnoses     Nocturia       Relevant Medications   tamsulosin (FLOMAX) 0.4 MG CAPS capsule       Meds ordered this encounter  Medications   bisoprolol-hydrochlorothiazide (ZIAC) 10-6.25 MG tablet    Sig: Take 1 tablet by mouth daily.    Dispense:  90 tablet    Refill:  3   tamsulosin (FLOMAX) 0.4 MG CAPS capsule    Sig: Take 1 capsule (0.4 mg total) by mouth in the morning and at bedtime.    Dispense:  180 capsule    Refill:  0   doxazosin (CARDURA) 1 MG tablet    Sig: Take 1 tablet (1 mg total) by mouth daily. For Prostate    Dispense:  90 tablet    Refill:  0    Follow-up: No follow-ups on file.    Beatrice Lecher, MD

## 2021-09-24 NOTE — Assessment & Plan Note (Signed)
Pressure was still significantly elevated today he feels like it really is better at home so we discussed making sure that he is sitting upright for at least 5 minutes and checking his blood pressure at home and then coming back here in a couple of weeks to have it rechecked.  If it still not at goal then we definitely need to adjust his medication in the meantime I will go ahead and send a refill to the pharmacy.

## 2021-09-25 DIAGNOSIS — I10 Essential (primary) hypertension: Secondary | ICD-10-CM | POA: Diagnosis not present

## 2021-09-26 LAB — BASIC METABOLIC PANEL WITHOUT GFR
BUN/Creatinine Ratio: 12 (calc) (ref 6–22)
BUN: 18 mg/dL (ref 7–25)
CO2: 29 mmol/L (ref 20–32)
Calcium: 9.5 mg/dL (ref 8.6–10.3)
Chloride: 101 mmol/L (ref 98–110)
Creat: 1.5 mg/dL — ABNORMAL HIGH (ref 0.70–1.35)
Glucose, Bld: 86 mg/dL (ref 65–139)
Potassium: 4.1 mmol/L (ref 3.5–5.3)
Sodium: 137 mmol/L (ref 135–146)
eGFR: 50 mL/min/1.73m2 — ABNORMAL LOW (ref >=60)

## 2021-09-28 NOTE — Progress Notes (Signed)
Hi Victor Price, repeat kidney function looks better it is a little closer back to your baseline which is reassuring.

## 2021-10-13 ENCOUNTER — Telehealth: Payer: Self-pay | Admitting: *Deleted

## 2021-10-13 MED ORDER — LOSARTAN POTASSIUM-HCTZ 100-25 MG PO TABS
1.0000 | ORAL_TABLET | Freq: Every day | ORAL | 0 refills | Status: DC
Start: 2021-10-13 — End: 2021-12-28

## 2021-10-13 NOTE — Telephone Encounter (Signed)
Pt called and stated that he was told by Dr Madilyn Fireman to call whenever his bp was over 130/80 and yesterday he stated that it was 150/85 and normally it runs around 140/75-80 consistently. He denies any SOB,CP, dizziness. ? ?He was calling to see if she would like to change anything. He states that he takes his bp the same time every night he hasn't missed any doses of his medication (Ziac 10-6.25 mg). He is ok with coming into the office if this is what she is recommending for him to do.  ? ?Will forward to pcp for advice.  ? ?

## 2021-10-13 NOTE — Telephone Encounter (Signed)
Chart note sent. 

## 2021-10-14 NOTE — Telephone Encounter (Signed)
Pt read my chart note.  ?

## 2021-11-02 ENCOUNTER — Telehealth: Payer: Self-pay | Admitting: *Deleted

## 2021-11-02 NOTE — Telephone Encounter (Signed)
Pt lvm indicating that he has noticed that his HR has been reading 100 since being on Losartan HCT 100-25. He also reports that his bp readings have been elevated also. Asked if he has noticed any other issues since starting this medication he stated that he has noticed some swelling.  ? ?He scheduled an appt for tomorrow to discuss this.  ?

## 2021-11-03 ENCOUNTER — Other Ambulatory Visit: Payer: Self-pay

## 2021-11-03 ENCOUNTER — Encounter: Payer: Self-pay | Admitting: Family Medicine

## 2021-11-03 ENCOUNTER — Ambulatory Visit (INDEPENDENT_AMBULATORY_CARE_PROVIDER_SITE_OTHER): Payer: Medicare HMO | Admitting: Family Medicine

## 2021-11-03 VITALS — BP 173/132 | HR 110 | Resp 18 | Ht 72.0 in | Wt 160.0 lb

## 2021-11-03 DIAGNOSIS — I10 Essential (primary) hypertension: Secondary | ICD-10-CM | POA: Diagnosis not present

## 2021-11-03 DIAGNOSIS — R000 Tachycardia, unspecified: Secondary | ICD-10-CM

## 2021-11-03 DIAGNOSIS — M7989 Other specified soft tissue disorders: Secondary | ICD-10-CM | POA: Diagnosis not present

## 2021-11-03 DIAGNOSIS — Z1211 Encounter for screening for malignant neoplasm of colon: Secondary | ICD-10-CM | POA: Diagnosis not present

## 2021-11-03 DIAGNOSIS — N1831 Chronic kidney disease, stage 3a: Secondary | ICD-10-CM | POA: Diagnosis not present

## 2021-11-03 MED ORDER — METOPROLOL SUCCINATE ER 25 MG PO TB24: 25.0000 mg | ORAL_TABLET | Freq: Every day | ORAL | 1 refills | Status: DC

## 2021-11-03 NOTE — Progress Notes (Signed)
? ?Acute Office Visit ? ?Subjective:  ? ? Patient ID: Victor Price, male    DOB: 1951-07-28, 71 y.o.   MRN: [id number] ? ?Chief Complaint  ?Patient presents with  ? Elevated Heartrate   ?  Patient states his resting heartrate has been around 110. Patient states he feels lightheaded at times.   ? Swelling  ?  Patient states he has been having swelling in his ankles since starting on the Losartan-HCTZ.   ? ? ?HPI ?Patient is in today for high heart rate.  We d/c'd his bisoprolol and inc the hctz and since then his heart rate has been elevated.  He says that his watch will alert him that sometimes it will go as high as 120 bpm at rest.  He had been on the medication since he was 21 or 22.  He has been checking his blood pressures at home and says typically they are running in the 150s.. Patient states he feels lightheaded at times.   ?Patient states he has been having swelling in his ankles since starting on the Losartan-HCTZ 100/25.  ? ?Says for years he has had a pounding headache when he bends over when he exercises and says that actually has gone away since switching blood pressure medications. ? ?Past Medical History:  ?Diagnosis Date  ? BPH (benign prostatic hyperplasia)   ? Bronchiectasis with acute lower respiratory infection ([place]) 08/13/2019  ? Bronchitis   ? inhaler prn  ? Cancer [facility])   ? skin cancer?  ? ED (erectile dysfunction)   ? GERD (gastroesophageal reflux disease)   ? HLD (hyperlipidemia)   ? Hypertension   ? Pneumonia   ? gets recurrent PNA in left lung, recent dx 08/13/19 left lower lobe  ? Seasonal allergies   ? ? ?Past Surgical History:  ?Procedure Laterality Date  ? BACK SURGERY  1980s  ? [facility]  ? COLONOSCOPY  2004, 2012  ? several - polyps  ? MULTIPLE TOOTH EXTRACTIONS    ? TOTAL HIP ARTHROPLASTY Left 08/24/2019  ? Procedure: LEFT TOTAL HIP ARTHROPLASTY-DIRECT ANTERIOR;  Surgeon: Marybelle Killings, MD;  Location: [facility];  Service: Orthopedics;  Laterality: Left;  ? ? ?History reviewed. No pertinent  family history. ? ?Social History  ? ?Socioeconomic History  ? Marital status: Soil scientist  ?  Spouse name: Not on file  ? Number of children: 0  ? Years of education: 54  ? Highest education level: High school graduate  ?Occupational History  ? Occupation: [employer]  ?  Employer: [employer]  ?  Comment: Retired  ?  Comment: Part-time  ?Tobacco Use  ? Smoking status: Never  ? Smokeless tobacco: Never  ?Vaping Use  ? Vaping Use: Never used  ?Substance and Sexual Activity  ? Alcohol use: No  ? Drug use: No  ? Sexual activity: Yes  ?Other Topics Concern  ? Not on file  ?Social History Narrative  ? Lives with his domestic partner and their family. Works part-time. 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  ? ?Social Determinants of Health  ? ?Financial Resource Strain: Low Risk   ? Difficulty of Paying Living Expenses: Not hard at all  ?Food Insecurity: No Food Insecurity  ? Worried About Charity fundraiser in the Last Year: Never true  ? Ran Out of Food in the Last Year: Never true  ?Transportation Needs: No Transportation Needs  ? Lack of Transportation (Medical): No  ? Lack of Transportation (Non-Medical): No  ?  Physical Activity: Sufficiently Active  ? Days of Exercise per Week: 7 days  ? Minutes of Exercise per Session: 60 min  ?Stress: No Stress Concern Present  ? Feeling of Stress : Not at all  ?Social Connections: Moderately Integrated  ? Frequency of Communication with Friends and Family: More than three times a week  ? Frequency of Social Gatherings with Friends and Family: Twice a week  ? Attends Religious Services: More than 4 times per year  ? Active Member of Clubs or Organizations: No  ? Attends Archivist Meetings: Never  ? Marital Status: Living with partner  ?Intimate Partner Violence: Not At Risk  ? Fear of Current or Ex-Partner: No  ? Emotionally Abused: No  ? Physically Abused: No  ? Sexually Abused: No  ? ? ?Outpatient Medications Prior to Visit  ?Medication Sig Dispense Refill  ?  albuterol (VENTOLIN HFA) 108 (90 Base) MCG/ACT inhaler Inhale 2 puffs into the lungs every 6 (six) hours as needed for wheezing or shortness of breath. 18 g prn  ? atorvastatin (LIPITOR) 40 MG tablet TAKE 1 TABLET BY MOUTH AT BEDTIME 90 tablet 3  ? clindamycin (CLEOCIN T) 1 % external solution 1(ONE) APPLICATION(S) TOPICAL 2(TWO) TIMES A DAY    ? [facility]S ALLERGY RELIEF-D 10-240 MG 24 hr tablet TAKE 1 TABLET BY MOUTH EVERY DAY 30 tablet 10  ? doxazosin (CARDURA) 1 MG tablet Take 1 tablet (1 mg total) by mouth daily. For Prostate 90 tablet 0  ? doxycycline (VIBRAMYCIN) 100 MG capsule Take 100 mg by mouth 2 (two) times daily.    ? Lidocaine-Hydrocortisone Ace (ANAMANTLE HC) 3-0.5 % KIT Apply 1 dose rectally up to  Bid prn for spasm. 1 kit PRN  ? losartan-hydrochlorothiazide (HYZAAR) 100-25 MG tablet Take 1 tablet by mouth daily. 90 tablet 0  ? mupirocin ointment (BACTROBAN) 2 % Apply topically 3 (three) times daily. 30 g prn  ? pantoprazole (PROTONIX) 40 MG tablet TAKE 1 TABLET BY MOUTH EVERY DAY 90 tablet 3  ? sildenafil (REVATIO) 20 MG tablet TAKE TWO TO FIVE TABLETS BY MOUTH DAILY AS NEEDED 40 tablet 5  ? tamsulosin (FLOMAX) 0.4 MG CAPS capsule Take 1 capsule (0.4 mg total) by mouth in the morning and at bedtime. 180 capsule 0  ? Testosterone 10 MG/ACT (2%) GEL APPLY 1 PUMP TO AN INNER THIGH AND 2 PUMPS TO OPPOSITE INNER THIGH IN AM. FORTESTA. 120 g 2  ? traZODone (DESYREL) 50 MG tablet TAKE 1/2 TO 2 TABLETS BY MOUTH AT BEDTIME AS NEEDED SLEEP 180 tablet 3  ? triamcinolone cream (KENALOG) 0.1 % Apply topically 2 (two) times daily.    ? benzonatate (TESSALON) 200 MG capsule TAKE 1 CAPSULE (200 MG TOTAL) BY MOUTH AT BEDTIME AS NEEDED FOR COUGH. (Patient not taking: Reported on 11/03/2021) 30 capsule 2  ? terbinafine (LAMISIL) 250 MG tablet TAKE 1 TABLET BY MOUTH EVERY DAY 90 tablet 1  ? ?No facility-administered medications prior to visit.  ? ? ?No Known Allergies ? ?Review of Systems ? ?   ?Objective:  ?  ?Physical  Exam ?Constitutional:   ?   Appearance: He is well-developed.  ?HENT:  ?   Head: Normocephalic and atraumatic.  ?Cardiovascular:  ?   Rate and Rhythm: Regular rhythm. Tachycardia present.  ?   Heart sounds: Normal heart sounds.  ?Pulmonary:  ?   Effort: Pulmonary effort is normal.  ?   Breath sounds: Normal breath sounds.  ?Skin: ?   General: Skin  is warm and dry.  ?Neurological:  ?   Mental Status: He is alert and oriented to person, place, and time.  ?Psychiatric:     ?   Behavior: Behavior normal.  ? ? ?BP (!) 173/132 (BP Location: Left Arm)   Pulse (!) 110   Resp 18   Ht 6' (1.829 m)   Wt 160 lb (72.6 kg)   SpO2 99%   BMI 21.70 kg/m?  ?Wt Readings from Last 3 Encounters:  ?11/03/21 160 lb (72.6 kg)  ?09/24/21 162 lb (73.5 kg)  ?05/11/21 161 lb (73 kg)  ? ? ?Health Maintenance Due  ?Topic Date Due  ? COLONOSCOPY (Pts 45-68yr Insurance coverage will need to be confirmed)  06/23/2021  ? ? ?There are no preventive care reminders to display for this patient. ? ? ?Lab Results  ?Component Value Date  ? TSH 0.77 05/14/2021  ? ?Lab Results  ?Component Value Date  ? WBC 7.4 05/14/2021  ? HGB 15.5 05/14/2021  ? HCT 46.2 05/14/2021  ? MCV 94.7 05/14/2021  ? PLT 238 05/14/2021  ? ?Lab Results  ?Component Value Date  ? NA 137 09/25/2021  ? K 4.1 09/25/2021  ? CO2 29 09/25/2021  ? GLUCOSE 86 09/25/2021  ? BUN 18 09/25/2021  ? CREATININE 1.50 (H) 09/25/2021  ? BILITOT 0.8 05/14/2021  ? ALKPHOS 75 08/15/2019  ? AST 26 05/14/2021  ? ALT 30 05/14/2021  ? PROT 6.5 05/14/2021  ? ALBUMIN 3.8 08/15/2019  ? CALCIUM 9.5 09/25/2021  ? ANIONGAP 8 08/15/2019  ? EGFR 50 (L) 09/25/2021  ? ?Lab Results  ?Component Value Date  ? CHOL 141 05/14/2021  ? ?Lab Results  ?Component Value Date  ? HDL 73 05/14/2021  ? ?Lab Results  ?Component Value Date  ? LDLCALC 54 05/14/2021  ? ?Lab Results  ?Component Value Date  ? TRIG 67 05/14/2021  ? ?Lab Results  ?Component Value Date  ? CHOLHDL 1.9 05/14/2021  ? ?No results found for: HGBA1C ? ?    ?Assessment & Plan:  ? ?Problem List Items Addressed This Visit   ? ?  ? Cardiovascular and Mediastinum  ? HYPERTENSION, BENIGN SYSTEMIC  ?  Blood pressure still elevated and similar to the pressures that he is actually b

## 2021-11-03 NOTE — Assessment & Plan Note (Signed)
Recheck renal function since new start ARB ?

## 2021-11-03 NOTE — Assessment & Plan Note (Signed)
Blood pressure still elevated and similar to the pressures that he is actually been getting at home.  He had been on a blood beta-blocker for decades and now that he is not on at his baseline has been tachycardic.  We will get an EKG today and start metoprolol 25 mg daily.  If his blood pressures are still elevated after 1 week then I will have him increase to 2 tabs daily.  Follow-up in 4 weeks. ?

## 2021-11-04 LAB — COMPLETE METABOLIC PANEL WITH GFR
AG Ratio: 1.4 (calc) (ref 1.0–2.5)
ALT: 27 U/L (ref 9–46)
AST: 22 U/L (ref 10–35)
Albumin: 4.1 g/dL (ref 3.6–5.1)
Alkaline phosphatase (APISO): 85 U/L (ref 35–144)
BUN/Creatinine Ratio: 12 (calc) (ref 6–22)
BUN: 20 mg/dL (ref 7–25)
CO2: 30 mmol/L (ref 20–32)
Calcium: 10.4 mg/dL — ABNORMAL HIGH (ref 8.6–10.3)
Chloride: 101 mmol/L (ref 98–110)
Creat: 1.68 mg/dL — ABNORMAL HIGH (ref 0.70–1.35)
Globulin: 2.9 g/dL (calc) (ref 1.9–3.7)
Glucose, Bld: 73 mg/dL (ref 65–99)
Potassium: 5.8 mmol/L — ABNORMAL HIGH (ref 3.5–5.3)
Sodium: 140 mmol/L (ref 135–146)
Total Bilirubin: 0.6 mg/dL (ref 0.2–1.2)
Total Protein: 7 g/dL (ref 6.1–8.1)
eGFR: 44 mL/min/{1.73_m2} — ABNORMAL LOW (ref >=60)

## 2021-11-04 LAB — MICROALBUMIN / CREATININE URINE RATIO
Creatinine, Urine: 33 mg/dL (ref 20–320)
Microalb Creat Ratio: 255 mcg/mg creat — ABNORMAL HIGH (ref <30)
Microalb, Ur: 8.4 mg/dL

## 2021-11-04 LAB — TSH: TSH: 0.52 mIU/L (ref 0.40–4.50)

## 2021-11-04 NOTE — Progress Notes (Signed)
Hi Victor Price, kidney function is fairly stable you sort of bounced between 1.4-1.7 it was 1.68 this time.  The potassium level was slightly elevated which was a little unusual.  This can come from hemolysis of the blood so want a recheck that next week and make sure that it comes down.  The thyroid level looks good.  But you do have a large amount of protein in your urine more than what should be expected.  So I would like to also refer you to a nephrologist which is a kidney specialist to make sure they do not recommend that we do anything different than what we are currently doing.  If he is okay with this then please let me know.

## 2021-11-05 ENCOUNTER — Other Ambulatory Visit: Payer: Self-pay | Admitting: Neurology

## 2021-11-05 DIAGNOSIS — R809 Proteinuria, unspecified: Secondary | ICD-10-CM

## 2021-11-05 DIAGNOSIS — N1831 Chronic kidney disease, stage 3a: Secondary | ICD-10-CM

## 2021-11-05 DIAGNOSIS — I10 Essential (primary) hypertension: Secondary | ICD-10-CM

## 2021-11-05 NOTE — Addendum Note (Signed)
Addended by: Narda Rutherford on: 11/05/2021 01:47 PM ? ? Modules accepted: Orders ? ?

## 2021-11-15 ENCOUNTER — Other Ambulatory Visit: Payer: Self-pay | Admitting: Family Medicine

## 2021-11-17 ENCOUNTER — Ambulatory Visit: Payer: Medicare HMO

## 2021-11-18 ENCOUNTER — Ambulatory Visit: Payer: Medicare HMO

## 2021-11-19 DIAGNOSIS — N1831 Chronic kidney disease, stage 3a: Secondary | ICD-10-CM | POA: Diagnosis not present

## 2021-11-25 ENCOUNTER — Other Ambulatory Visit: Payer: Self-pay | Admitting: Family Medicine

## 2021-11-25 DIAGNOSIS — N3289 Other specified disorders of bladder: Secondary | ICD-10-CM | POA: Diagnosis not present

## 2021-11-25 DIAGNOSIS — I10 Essential (primary) hypertension: Secondary | ICD-10-CM

## 2021-11-25 DIAGNOSIS — N281 Cyst of kidney, acquired: Secondary | ICD-10-CM | POA: Diagnosis not present

## 2021-11-25 DIAGNOSIS — N1831 Chronic kidney disease, stage 3a: Secondary | ICD-10-CM | POA: Diagnosis not present

## 2021-12-21 ENCOUNTER — Ambulatory Visit (INDEPENDENT_AMBULATORY_CARE_PROVIDER_SITE_OTHER): Payer: Medicare HMO | Admitting: Family Medicine

## 2021-12-21 DIAGNOSIS — Z0000 Encounter for general adult medical examination without abnormal findings: Secondary | ICD-10-CM

## 2021-12-21 NOTE — Patient Instructions (Addendum)
?MEDICARE ANNUAL WELLNESS VISIT ?Health Maintenance Summary and Written Plan of Care ? ?[name] , ? ?Thank you for allowing me to perform your Medicare Annual Wellness Visit and for your ongoing commitment to your health.  ? ?Health Maintenance & Immunization History ?Health Maintenance  ?Topic Date Due  ?? COVID-19 Vaccine (3 - Booster for Pfizer series) 01/06/2022 (Originally 12/17/2019)  ?? TETANUS/TDAP  11/04/2022 (Originally 11/01/2021)  ?? COLONOSCOPY (Pts 45-58yr Insurance coverage will need to be confirmed)  12/22/2022 (Originally 06/23/2021)  ?? Hepatitis C Screening  12/22/2022 (Originally 10/13/1968)  ?? INFLUENZA VACCINE  03/09/2022  ?? Pneumonia Vaccine 69 Years old  Completed  ?? Zoster Vaccines- Shingrix  Completed  ?? HPV VACCINES  Aged Out  ? ?Immunization History  ?Administered Date(s) Administered  ?? Fluad Quad(high Dose 65+) 04/09/2020, 05/11/2021  ?? Influenza Split 05/17/2012  ?? Influenza Whole 05/10/2005, 04/22/2011  ?? Influenza, High Dose Seasonal PF 03/28/2019  ?? Influenza,inj,Quad PF,6+ Mos 04/02/2013, 05/28/2015  ?? Influenza-Unspecified 04/22/2016, 04/30/2017, 05/16/2018  ?? PFIZER(Purple Top)SARS-COV-2 Vaccination 10/01/2019, 10/22/2019  ?? PNEUMOCOCCAL CONJUGATE-20 05/11/2021  ?? Pneumococcal Polysaccharide-23 05/10/2005  ?? Td 08/11/1998  ?? Tdap 11/02/2011  ?? Zoster Recombinat (Shingrix) 12/30/2019, 02/29/2020  ?? Zoster, Live 11/19/2011  ? ? ?These are the patient goals that we discussed: ? Goals Addressed   ?  ?  ?  ?  ?  ? This Visit's Progress  ? ?  Patient Stated (pt-stated)     ?   Continue to stay healthy and active. ?  ?  ?  ? ?This is a list of Health Maintenance Items that are overdue or due now: ?Td vaccine ?Colorectal cancer screening - scheduled for 12/30/21. ? ?Orders/Referrals Placed Today: ?No orders of the defined types were placed in this encounter. ? ?(Contact our referral department at [phone] you have not spoken with someone about your referral  appointment within the next 5 days)  ? ? ?Follow-up Plan ?Follow-up with MHali Marry MD as planned ?Schedule your tetanus at your pharmacy. ?Medicare wellness visit in one year. ?Patient will access AVS on my chart. ? ? ? ?  ?Health Maintenance, Male ?Adopting a healthy lifestyle and getting preventive care are important in promoting health and wellness. Ask your health care provider about: ?The right schedule for you to have regular tests and exams. ?Things you can do on your own to prevent diseases and keep yourself healthy. ?What should I know about diet, weight, and exercise? ?Eat a healthy diet ? ?Eat a diet that includes plenty of vegetables, fruits, low-fat dairy products, and lean protein. ?Do not eat a lot of foods that are high in solid fats, added sugars, or sodium. ?Maintain a healthy weight ?Body mass index (BMI) is a measurement that can be used to identify possible weight problems. It estimates body fat based on height and weight. Your health care provider can help determine your BMI and help you achieve or maintain a healthy weight. ?Get regular exercise ?Get regular exercise. This is one of the most important things you can do for your health. Most adults should: ?Exercise for at least 150 minutes each week. The exercise should increase your heart rate and make you sweat (moderate-intensity exercise). ?Do strengthening exercises at least twice a week. This is in addition to the moderate-intensity exercise. ?Spend less time sitting. Even light physical activity can be beneficial. ?Watch cholesterol and blood lipids ?Have your blood tested for lipids and cholesterol at 69years of age, then have this test  every 5 years. ?You may need to have your cholesterol levels checked more often if: ?Your lipid or cholesterol levels are high. ?You are older than 69 years of age. ?You are at high risk for heart disease. ?What should I know about cancer screening? ?Many types of cancers can be detected  early and may often be prevented. Depending on your health history and family history, you may need to have cancer screening at various ages. This may include screening for: ?Colorectal cancer. ?Prostate cancer. ?Skin cancer. ?Lung cancer. ?What should I know about heart disease, diabetes, and high blood pressure? ?Blood pressure and heart disease ?High blood pressure causes heart disease and increases the risk of stroke. This is more likely to develop in people who have high blood pressure readings or are overweight. ?Talk with your health care provider about your target blood pressure readings. ?Have your blood pressure checked: ?Every 3-5 years if you are 33-61 years of age. ?Every year if you are 3 years old or older. ?If you are between the ages of 21 and 76 and are a current or former smoker, ask your health care provider if you should have a one-time screening for abdominal aortic aneurysm (AAA). ?Diabetes ?Have regular diabetes screenings. This checks your fasting blood sugar level. Have the screening done: ?Once every three years after age 68 if you are at a normal weight and have a low risk for diabetes. ?More often and at a younger age if you are overweight or have a high risk for diabetes. ?What should I know about preventing infection? ?Hepatitis B ?If you have a higher risk for hepatitis B, you should be screened for this virus. Talk with your health care provider to find out if you are at risk for hepatitis B infection. ?Hepatitis C ?Blood testing is recommended for: ?Everyone born from [birth year] through 1963. ?Anyone with known risk factors for hepatitis C. ?Sexually transmitted infections (STIs) ?You should be screened each year for STIs, including gonorrhea and chlamydia, if: ?You are sexually active and are younger than 69 years of age. ?You are older than 69 years of age and your health care provider tells you that you are at risk for this type of infection. ?Your sexual activity has changed since  you were last screened, and you are at increased risk for chlamydia or gonorrhea. Ask your health care provider if you are at risk. ?Ask your health care provider about whether you are at high risk for HIV. Your health care provider may recommend a prescription medicine to help prevent HIV infection. If you choose to take medicine to prevent HIV, you should first get tested for HIV. You should then be tested every 3 months for as long as you are taking the medicine. ?Follow these instructions at home: ?Alcohol use ?Do not drink alcohol if your health care provider tells you not to drink. ?If you drink alcohol: ?Limit how much you have to 0-2 drinks a day. ?Know how much alcohol is in your drink. In the [place] one drink equals one 12 oz bottle of beer (355 mL), one 5 oz glass of wine (148 mL), or one 1? oz glass of hard liquor (44 mL). ?Lifestyle ?Do not use any products that contain nicotine or tobacco. These products include cigarettes, chewing tobacco, and vaping devices, such as e-cigarettes. If you need help quitting, ask your health care provider. ?Do not use street drugs. ?Do not share needles. ?Ask your health care provider for help if you need support  or information about quitting drugs. ?General instructions ?Schedule regular health, dental, and eye exams. ?Stay current with your vaccines. ?Tell your health care provider if: ?You often feel depressed. ?You have ever been abused or do not feel safe at home. ?Summary ?Adopting a healthy lifestyle and getting preventive care are important in promoting health and wellness. ?Follow your health care provider's instructions about healthy diet, exercising, and getting tested or screened for diseases. ?Follow your health care provider's instructions on monitoring your cholesterol and blood pressure. ?This information is not intended to replace advice given to you by your health care provider. Make sure you discuss any questions you have with your health care  provider. ?Document Revised: 12/15/2020 Document Reviewed: 12/15/2020 ?Elsevier Patient Education ? [place]. ? ?

## 2021-12-21 NOTE — Progress Notes (Signed)
? ? ?MEDICARE ANNUAL WELLNESS VISIT ? ?12/21/2021 ? ?Telephone Visit Disclaimer ?This Medicare AWV was conducted by telephone due to national recommendations for restrictions regarding the COVID-19 Pandemic (e.g. social distancing).  I verified, using two identifiers, that I am speaking with [name] or their authorized healthcare agent. I discussed the limitations, risks, security, and privacy concerns of performing an evaluation and management service by telephone and the potential availability of an in-person appointment in the future. The patient expressed understanding and agreed to proceed.  ?Location of Patient: Home ?Location of Provider (nurse):  In the office. ? ?Subjective:  ? ? ?OISIN Price is a 71 y.o. male patient of [name], Rene Kocher, MD who had a Medicare Annual Wellness Visit today via telephone. Caydan is Working part time and lives with their family. he does not have any children. he reports that he is socially active and does interact with friends/family regularly. he is moderately physically active and enjoys yard work and Warden/ranger.. ? ?Patient Care Team: ?Hali Marry, MD as PCP - General ?Darius Bump, [facility] as Pharmacist (Pharmacist) ? ? ?  12/21/2021  ?  3:32 PM 12/01/2020  ?  1:11 PM 08/15/2019  ?  1:37 PM 10/02/2014  ?  7:11 AM  ?Advanced Directives  ?Does Patient Have a Medical Advance Directive? Yes Yes No No  ?Type of Advance Directive Living will [place];Living will    ?Does patient want to make changes to medical advance directive? No - Patient declined No - Patient declined    ?Copy of [place] in Chart?  No - copy requested    ?Would patient like information on creating a medical advance directive?   No - Patient declined No - patient declined information  ? ? ?Hospital Utilization Over the Past 12 Months: ?# of hospitalizations or ER visits: 0 ?# of surgeries: 0 ? ?Review of Systems    ?Patient reports that his overall  health is unchanged compared to last year. ? ?History obtained from chart review and the patient ? ?Patient Reported Readings (BP, Pulse, CBG, Weight, etc) ?none ? ?Pain Assessment ?Pain : No/denies pain ? ?  ? ?Current Medications & Allergies (verified) ?Allergies as of 12/21/2021   ?No Known Allergies ?  ? ?  ?Medication List  ?  ? ?  ? Accurate as of Dec 21, 2021  3:43 PM. If you have any questions, ask your nurse or doctor.  ?  ?  ? ?  ? ?albuterol 108 (90 Base) MCG/ACT inhaler ?Commonly known as: VENTOLIN HFA ?Inhale 2 puffs into the lungs every 6 (six) hours as needed for wheezing or shortness of breath. ?  ?atorvastatin 40 MG tablet ?Commonly known as: LIPITOR ?TAKE 1 TABLET BY MOUTH AT BEDTIME ?  ?clindamycin 1 % external solution ?Commonly known as: 7 T ?1(ONE) APPLICATION(S) TOPICAL 2(TWO) TIMES A DAY ?  ?[employer] Allergy Relief-D 10-240 MG 24 hr tablet ?Generic drug: loratadine-pseudoephedrine ?TAKE 1 TABLET BY MOUTH EVERY DAY ?  ?doxazosin 1 MG tablet ?Commonly known as: Cardura ?Take 1 tablet (1 mg total) by mouth daily. For Prostate ?  ?doxycycline 100 MG capsule ?Commonly known as: VIBRAMYCIN ?Take 100 mg by mouth 2 (two) times daily. ?  ?Lidocaine-Hydrocortisone Ace 3-0.5 % Kit ?Commonly known as: AnaMantle HC ?Apply 1 dose rectally up to  Bid prn for spasm. ?  ?losartan-hydrochlorothiazide 100-25 MG tablet ?Commonly known as: HYZAAR ?Take 1 tablet by mouth daily. ?  ?metoprolol  succinate 25 MG 24 hr tablet ?Commonly known as: TOPROL-XL ?TAKE 1 TABLET BY MOUTH DAILY. IF BLOOD SUGAR > 130 AFTER 1 WEEK PLEASE INCREASE TO 2 TABS DAILY. ?  ?multivitamin tablet ?Take 1 tablet by mouth daily. ?  ?mupirocin ointment 2 % ?Commonly known as: BACTROBAN ?Apply topically 3 (three) times daily. ?  ?pantoprazole 40 MG tablet ?Commonly known as: PROTONIX ?TAKE 1 TABLET BY MOUTH EVERY DAY ?  ?sildenafil 20 MG tablet ?Commonly known as: REVATIO ?TAKE TWO TO FIVE TABLETS BY MOUTH DAILY AS NEEDED ?  ?tacrolimus 0.1 %  ointment ?Commonly known as: PROTOPIC ?Apply 1 application. topically 2 (two) times daily. ?  ?tamsulosin 0.4 MG Caps capsule ?Commonly known as: FLOMAX ?Take 1 capsule (0.4 mg total) by mouth in the morning and at bedtime. ?  ?Testosterone 10 MG/ACT (2%) Gel ?APPLY 1 PUMP TO AN INNER THIGH AND 2 PUMPS TO OPPOSITE INNER THIGH IN AM. FORTESTA. ?  ?traZODone 50 MG tablet ?Commonly known as: DESYREL ?TAKE 1/2 TO 2 TABLETS BY MOUTH AT BEDTIME AS NEEDED SLEEP ?  ?triamcinolone cream 0.1 % ?Commonly known as: KENALOG ?Apply topically 2 (two) times daily. ?  ? ?  ? ? ?History (reviewed): ?Past Medical History:  ?Diagnosis Date  ? BPH (benign prostatic hyperplasia)   ? Bronchiectasis with acute lower respiratory infection ([place]) 08/13/2019  ? Bronchitis   ? inhaler prn  ? Cancer [facility])   ? skin cancer?  ? ED (erectile dysfunction)   ? GERD (gastroesophageal reflux disease)   ? HLD (hyperlipidemia)   ? Hypertension   ? Pneumonia   ? gets recurrent PNA in left lung, recent dx 08/13/19 left lower lobe  ? Seasonal allergies   ? ?Past Surgical History:  ?Procedure Laterality Date  ? BACK SURGERY  1980s  ? [facility]  ? COLONOSCOPY  2004, 2012  ? several - polyps  ? MULTIPLE TOOTH EXTRACTIONS    ? TOTAL HIP ARTHROPLASTY Left 08/24/2019  ? Procedure: LEFT TOTAL HIP ARTHROPLASTY-DIRECT ANTERIOR;  Surgeon: Marybelle Killings, MD;  Location: [place];  Service: Orthopedics;  Laterality: Left;  ? ?History reviewed. No pertinent family history. ?Social History  ? ?Socioeconomic History  ? Marital status: Soil scientist  ?  Spouse name: Not on file  ? Number of children: 0  ? Years of education: 22  ? Highest education level: High school graduate  ?Occupational History  ? Occupation: [employer]  ?  Employer: [employer]  ?  Comment: Retired  ?  Comment: Part-time  ?Tobacco Use  ? Smoking status: Never  ? Smokeless tobacco: Never  ?Vaping Use  ? Vaping Use: Never used  ?Substance and Sexual Activity  ? Alcohol use: No  ? Drug use: No  ? Sexual  activity: Yes  ?Other Topics Concern  ? Not on file  ?Social History Narrative  ? Lives with his domestic partner. Works part-time. 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  ? ?Social Determinants of Health  ? ?Financial Resource Strain: Low Risk   ? Difficulty of Paying Living Expenses: Not hard at all  ?Food Insecurity: No Food Insecurity  ? Worried About Charity fundraiser in the Last Year: Never true  ? Ran Out of Food in the Last Year: Never true  ?Transportation Needs: No Transportation Needs  ? Lack of Transportation (Medical): No  ? Lack of Transportation (Non-Medical): No  ?Physical Activity: Sufficiently Active  ? Days of Exercise per Week: 5 days  ? Minutes of  Exercise per Session: 60 min  ?Stress: No Stress Concern Present  ? Feeling of Stress : Not at all  ?Social Connections: Moderately Integrated  ? Frequency of Communication with Friends and Family: More than three times a week  ? Frequency of Social Gatherings with Friends and Family: More than three times a week  ? Attends Religious Services: More than 4 times per year  ? Active Member of Clubs or Organizations: No  ? Attends Archivist Meetings: Never  ? Marital Status: Living with partner  ? ? ?Activities of Daily Living ? ?  12/17/2021  ?  7:04 PM  ?In your present state of health, do you have any difficulty performing the following activities:  ?Hearing? 0  ?Vision? 0  ?Difficulty concentrating or making decisions? 0  ?Walking or climbing stairs? 0  ?Dressing or bathing? 0  ?Doing errands, shopping? 0  ?Preparing Food and eating ? N  ?Using the Toilet? N  ?In the past six months, have you accidently leaked urine? N  ?Do you have problems with loss of bowel control? N  ?Managing your Medications? N  ?Managing your Finances? N  ?Housekeeping or managing your Housekeeping? N  ? ? ?Patient Education/ Literacy ?How often do you need to have someone help you when you read instructions, pamphlets, or other written materials from your doctor or  pharmacy?: 1 - Never ?What is the last grade level you completed in school?: 2 years of college ? ?Exercise ?Current Exercise Habits: Home exercise routine, Type of exercise: strength training/weights, Time (

## 2021-12-22 ENCOUNTER — Other Ambulatory Visit: Payer: Self-pay | Admitting: Family Medicine

## 2021-12-22 DIAGNOSIS — N400 Enlarged prostate without lower urinary tract symptoms: Secondary | ICD-10-CM

## 2021-12-26 ENCOUNTER — Other Ambulatory Visit: Payer: Self-pay | Admitting: Family Medicine

## 2021-12-26 DIAGNOSIS — I10 Essential (primary) hypertension: Secondary | ICD-10-CM

## 2021-12-27 ENCOUNTER — Other Ambulatory Visit: Payer: Self-pay | Admitting: Family Medicine

## 2021-12-27 DIAGNOSIS — R351 Nocturia: Secondary | ICD-10-CM

## 2021-12-30 DIAGNOSIS — D123 Benign neoplasm of transverse colon: Secondary | ICD-10-CM | POA: Diagnosis not present

## 2021-12-30 DIAGNOSIS — Z1211 Encounter for screening for malignant neoplasm of colon: Secondary | ICD-10-CM | POA: Diagnosis not present

## 2021-12-30 DIAGNOSIS — E7800 Pure hypercholesterolemia, unspecified: Secondary | ICD-10-CM | POA: Diagnosis not present

## 2021-12-30 DIAGNOSIS — D124 Benign neoplasm of descending colon: Secondary | ICD-10-CM | POA: Diagnosis not present

## 2021-12-30 LAB — HM COLONOSCOPY

## 2022-01-05 ENCOUNTER — Other Ambulatory Visit: Payer: Self-pay | Admitting: Family Medicine

## 2022-01-05 DIAGNOSIS — N529 Male erectile dysfunction, unspecified: Secondary | ICD-10-CM

## 2022-01-18 ENCOUNTER — Telehealth: Payer: Self-pay | Admitting: Family Medicine

## 2022-01-18 DIAGNOSIS — Z96642 Presence of left artificial hip joint: Secondary | ICD-10-CM

## 2022-01-18 DIAGNOSIS — M25552 Pain in left hip: Secondary | ICD-10-CM

## 2022-01-18 NOTE — Telephone Encounter (Signed)
Urgent referral placed to [facility] as Dr. Darene Lamer is out of the office this week.  Charyl Bigger, CMA

## 2022-01-18 NOTE — Telephone Encounter (Signed)
Can we get him in with Dr. Darene Lamer tomorrow?  Or even our sports med at [facility]?

## 2022-01-18 NOTE — Telephone Encounter (Signed)
Pt called and said he is experiencing pain from his hip down to his leg. He said it started on Friday and his pain is not up to an 8. There are no available appts today.

## 2022-01-27 ENCOUNTER — Ambulatory Visit (INDEPENDENT_AMBULATORY_CARE_PROVIDER_SITE_OTHER): Payer: Medicare HMO | Admitting: Physician Assistant

## 2022-01-27 ENCOUNTER — Encounter: Payer: Self-pay | Admitting: Physician Assistant

## 2022-01-27 VITALS — BP 140/90 | HR 80 | Ht 72.0 in | Wt 163.0 lb

## 2022-01-27 DIAGNOSIS — Z79899 Other long term (current) drug therapy: Secondary | ICD-10-CM | POA: Diagnosis not present

## 2022-01-27 DIAGNOSIS — J209 Acute bronchitis, unspecified: Secondary | ICD-10-CM | POA: Diagnosis not present

## 2022-01-27 DIAGNOSIS — K4090 Unilateral inguinal hernia, without obstruction or gangrene, not specified as recurrent: Secondary | ICD-10-CM | POA: Diagnosis not present

## 2022-01-27 MED ORDER — PREDNISONE 50 MG PO TABS: ORAL_TABLET | ORAL | 0 refills | Status: DC

## 2022-01-27 NOTE — Progress Notes (Signed)
Acute Office Visit  Subjective:     Patient ID: Victor Price, male    DOB: October 22, 1950, 71 y.o.   MRN: [id number]  Chief Complaint  Patient presents with   Hernia    HPI Patient is a 71 yo male who presents to the clinic with left lower inguinal discomfort and bulging that he has noticed for 2 weeks. He feels it more when it coughs. No known injury but he does do a lot of heavy lifting. No pain into testicles. No fever, chills, body aches.   Pt has hx of frequent pneumonia and bronchiectasis. He is on abx but continues to have worsening cough and SOB. Not on any inhalers. Would like prednisone. No fever, chills, body aches.   .. Active Ambulatory Problems    Diagnosis Date Noted   THYROID NODULE, LEFT 05/23/2007   Hyperlipidemia 05/17/2006   HYPERTENSION, BENIGN SYSTEMIC 05/17/2006   ERECTILE DYSFUNCTION, ORGANIC 07/23/2010   BPH (benign prostatic hyperplasia) 12/14/2011   Herniated disc 01/29/2013   Peripheral neuropathy 04/02/2013   Insomnia 04/30/2014   Radiculitis of right cervical region 08/28/2014   DDD (degenerative disc disease), cervical 08/28/2014   Bronchiectasis ([place]) 07/20/2016   CKD (chronic kidney disease) stage 3, GFR 30-59 ml/min ([place]) 02/08/2017   Subclinical hyperthyroidism 10/31/2017   Hypogonadism in male 10/31/2017   DDD (degenerative disc disease), lumbar 06/05/2019   H/O total hip arthroplasty, left 10/19/2019   Carpal tunnel syndrome, left upper limb 12/04/2019   Left inguinal hernia 01/27/2022   Resolved Ambulatory Problems    Diagnosis Date Noted   Acute bronchitis 09/22/2010   Iliotibial band syndrome of left side 04/05/2013   CKD (chronic kidney disease) stage 2, GFR 60-89 ml/min 08/14/2014   Unilateral primary osteoarthritis, left hip 07/08/2019   Numbness and tingling in right hand 10/19/2019   Carpal tunnel syndrome, right upper limb 12/04/2019   Past Medical History:  Diagnosis Date   Bronchiectasis with acute lower respiratory  infection ([place]) 08/13/2019   Bronchitis    Cancer ([facility])    ED (erectile dysfunction)    GERD (gastroesophageal reflux disease)    HLD (hyperlipidemia)    Hypertension    Pneumonia    Seasonal allergies     ROS  See HPI.     Objective:    BP 140/90   Pulse 80   Ht 6' (1.829 m)   Wt 163 lb (73.9 kg)   SpO2 100%   BMI 22.11 kg/m  BP Readings from Last 3 Encounters:  01/27/22 140/90  11/03/21 (!) 173/132  09/24/21 (!) 171/105      Physical Exam Vitals reviewed.  Constitutional:      Appearance: Normal appearance.  HENT:     Head: Normocephalic.  Neck:     Vascular: No carotid bruit.  Cardiovascular:     Rate and Rhythm: Normal rate and regular rhythm.     Pulses: Normal pulses.  Pulmonary:     Effort: Pulmonary effort is normal.     Breath sounds: Normal breath sounds.  Abdominal:     General: Bowel sounds are normal. There is no distension.     Palpations: Abdomen is soft. There is no mass.     Tenderness: There is no abdominal tenderness. There is no right CVA tenderness, left CVA tenderness, guarding or rebound.     Hernia: A hernia is present.     Comments: Left lower inguinal hernia  Musculoskeletal:     Right lower leg: No edema.  Left lower leg: No edema.  Lymphadenopathy:     Cervical: Cervical adenopathy present.  Neurological:     General: No focal deficit present.     Mental Status: He is alert and oriented to person, place, and time.  Psychiatric:        Mood and Affect: Mood normal.           Assessment & Plan:  Marland KitchenMarland KitchenKyndall was seen today for hernia.  Diagnoses and all orders for this visit:  Left inguinal hernia -     Ambulatory referral to General Surgery  Acute bronchitis, unspecified organism -     predniSONE (DELTASONE) 50 MG tablet; One tab PO daily for 5 days.  Medication management -     Magnesium  Palpated left inguinal hernia Referral made to general surgery Discussed red flags signs and symptoms and when to go to  ED.   Pt on abx Added prednisone Declined CXR today Use albuterol as needed.  Follow up as needed or if symptoms persist   Iran Planas, PA-C

## 2022-01-27 NOTE — Patient Instructions (Signed)
Hernia, Adult     A hernia is the bulging of an organ or tissue through a weak spot in the muscles of the abdomen. Hernias develop most often near the belly button (navel) or the area where the leg meets the lower abdomen (groin). Common types of hernias include: Incisional hernia. This type bulges through a scar from an abdominal surgery. Umbilical hernia. This type develops near the navel. Inguinal hernia. This type develops in the groin or scrotum. Femoral hernia. This type develops below the groin, in the upper thigh area. Hiatal hernia. This type occurs when part of the stomach slides above the muscle that separates the abdomen from the chest (diaphragm). What are the causes? This condition may be caused by: Heavy lifting. Coughing over a long period of time. Straining to have a bowel movement. Constipation can lead to straining. An incision made during abdominal surgery. A physical problem that is present at birth (congenital defect). Being overweight or obese. Smoking. Excess fluid in the abdomen. Undescended testicles in males. What are the signs or symptoms? The main symptom is a skin-colored, rounded bulge in the area of the hernia. However, a bulge may not always be present. It may grow bigger or be more visible when you cough or strain (such as when lifting something heavy). A hernia that can be pushed back into the abdomen (is reducible) rarely causes pain. A hernia that cannot be pushed back into the abdomen (is incarcerated) may lose its blood supply (become strangulated). A hernia that is incarcerated may cause: Pain. Fever. Nausea and vomiting. Swelling. Constipation. How is this diagnosed? A hernia may be diagnosed based on: Your symptoms and medical history. A physical exam. Your health care provider may ask you to cough or move in certain ways to see if the hernia becomes visible. Imaging tests, such as: X-rays. Ultrasound. CT scan. How is this treated? A  hernia that is small and painless may not need to be treated. A hernia that is large or painful may be treated with surgery. Inguinal hernias may be treated with surgery to prevent incarceration or strangulation. Strangulated hernias are always treated with surgery because the strangulation causes a lack of blood supply to the trapped organ or tissue. Surgery to treat a hernia involves pushing the bulge back into place and repairing the weak area of the muscle or abdominal wall. Follow these instructions at home: Activity Avoid straining. Do not lift anything that is heavier than 10 lb (4.5 kg), or the limit that you are told, until your health care provider says that it is safe. When lifting heavy objects, lift with your leg muscles, not your back muscles. Preventing constipation Take actions to prevent constipation. Constipation leads to straining with bowel movements, which can make a hernia worse or cause a hernia repair to break down. Your health care provider may recommend that you take these actions to prevent or treat constipation: Drink enough fluid to keep your urine pale yellow. Take over-the-counter or prescription medicines. Eat foods that are high in fiber, such as beans, whole grains, and fresh fruits and vegetables. Limit foods that are high in fat and processed sugars, such as fried or sweet foods. General instructions When coughing, try to cough gently. You may try to push the hernia back in place by very gently pressing on it while lying down. Do not try to force the bulge back in if it will not push in easily. If you are overweight, work with your health care provider  to lose weight safely. Do not use any products that contain nicotine or tobacco. These products include cigarettes, chewing tobacco, and vaping devices, such as e-cigarettes. If you need help quitting, ask your health care provider. If you are scheduled for hernia repair, watch your hernia for any changes in shape,  size, or color. Tell your health care provider about any changes or new symptoms. Take over-the-counter and prescription medicines only as told by your health care provider. Keep all follow-up visits. This is important. Contact a health care provider if: You develop new pain, swelling, or redness around your hernia. You have signs of constipation, such as: Fewer bowel movements in a week than normal. Difficulty having a bowel movement. Stools that are dry, hard, or larger than normal. Get help right away if: You have a fever or chills. You have abdominal pain that gets worse. You feel nauseous or you vomit. You cannot push the hernia back in place by very gently pressing on it while lying down. Do not try to force the bulge back in if it will not go in easily. The hernia: Changes in shape, size, or color. Feels hard or tender. These symptoms may represent a serious problem that is an emergency. Do not wait to see if the symptoms will go away. Get medical help right away. Call your local emergency services (911 in the [place] Do not drive yourself to the hospital. Summary A hernia is the bulging of an organ or tissue through a weak spot in the muscles of the abdomen. The main symptom is a skin-colored bulge in the hernia area. However, a bulge may not always be present. It may grow bigger or more visible when you cough or strain (such as when having a bowel movement). A hernia that is small and painless may not need to be treated. A hernia that is large or painful may be treated with surgery. Surgery to treat a hernia involves pushing the bulge back into place and repairing the weak part of the abdomen. This information is not intended to replace advice given to you by your health care provider. Make sure you discuss any questions you have with your health care provider. Document Revised: 03/03/2020 Document Reviewed: 03/03/2020 Elsevier Patient Education  2023 [employer] Inc.  

## 2022-01-28 LAB — MAGNESIUM: Magnesium: 1.8 mg/dL (ref 1.5–2.5)

## 2022-01-28 NOTE — Progress Notes (Signed)
Magnesium in the morning range but low normal. You could certainly start a supplement and see if it benefits you.

## 2022-02-08 ENCOUNTER — Ambulatory Visit (INDEPENDENT_AMBULATORY_CARE_PROVIDER_SITE_OTHER): Payer: Medicare HMO | Admitting: Family Medicine

## 2022-02-08 ENCOUNTER — Encounter: Payer: Self-pay | Admitting: Family Medicine

## 2022-02-08 VITALS — BP 138/85 | HR 109 | Resp 18 | Ht 72.0 in | Wt 159.0 lb

## 2022-02-08 DIAGNOSIS — I10 Essential (primary) hypertension: Secondary | ICD-10-CM | POA: Diagnosis not present

## 2022-02-08 DIAGNOSIS — M5136 Other intervertebral disc degeneration, lumbar region: Secondary | ICD-10-CM | POA: Diagnosis not present

## 2022-02-08 DIAGNOSIS — R053 Chronic cough: Secondary | ICD-10-CM | POA: Diagnosis not present

## 2022-02-08 DIAGNOSIS — J470 Bronchiectasis with acute lower respiratory infection: Secondary | ICD-10-CM

## 2022-02-08 NOTE — Progress Notes (Signed)
   Established Patient Office Visit  Subjective   Patient ID: Victor Price, male    DOB: 08-08-1951  Age: 71 y.o. MRN: [id number]  Chief Complaint  Patient presents with   Hypertension    Follow up    Referral    [facility] surgeon to [facility]    Discuss Medication    Patient would like to discuss prescription for Magnesium     HPI  Hypertension- Pt denies chest pain, SOB, dizziness, or heart palpitations.  Taking meds as directed w/o problems.  Denies medication side effects.  He reports he has been taking magnesium to help lower his blood pressure and would like a prescription for it.    He will be getting dental implants in August.    Worsening numbness ache aching in his lower legs from knees down to feet.  Previously consulted with Dr. Rodell Perna.  And was told to return if symptoms got worse.  He says it mostly affects him at night.  History of lumbar degenerative disc disease.  Reports he still has a chronic cough.  History of bronchiectasis.  Last chest x-ray from 21, last chest CT 2008 that I could find.      ROS    Objective:     BP 138/85   Pulse (!) 109   Resp 18   Ht 6' (1.829 m)   Wt 159 lb (72.1 kg)   SpO2 98%   BMI 21.56 kg/m    Physical Exam Constitutional:      Appearance: He is well-developed.  HENT:     Head: Normocephalic and atraumatic.  Cardiovascular:     Rate and Rhythm: Normal rate and regular rhythm.     Heart sounds: Normal heart sounds.  Pulmonary:     Effort: Pulmonary effort is normal.     Comments: Coarse BS bilaterally Skin:    General: Skin is warm and dry.  Neurological:     Mental Status: He is alert and oriented to person, place, and time.  Psychiatric:        Behavior: Behavior normal.      No results found for any visits on 02/08/22.    The 10-year ASCVD risk score (Arnett DK, et al., 2019) is: 15.5%    Assessment & Plan:   Problem List Items Addressed This Visit       Cardiovascular and Mediastinum    HYPERTENSION, BENIGN SYSTEMIC    Well controlled. Continue current regimen. Follow up in  6 mo         Respiratory   Bronchiectasis ([place])    I feel like he would really benefit from an updated chest CT the last 1 was over 10 years ago specially now that his cough has become more chronic it seems to always be worse in the evenings..      Relevant Orders   CT Chest W Contrast     Musculoskeletal and Integument   DDD (degenerative disc disease), lumbar    Place new referral back to Dr. Rodell Perna.      Relevant Orders   Ambulatory referral to Orthopedic Surgery   Other Visit Diagnoses     Chronic cough    -  Primary   Relevant Orders   CT Chest W Contrast       Return in about 6 weeks (around 03/22/2022) for Hypertension.    Beatrice Lecher, MD

## 2022-02-08 NOTE — Assessment & Plan Note (Signed)
Well controlled. Continue current regimen. Follow up in  6 mo  

## 2022-02-08 NOTE — Assessment & Plan Note (Addendum)
I feel like he would really benefit from an updated chest CT the last 1 was over 10 years ago specially now that his cough has become more chronic it seems to always be worse in the evenings.Marland Kitchen

## 2022-02-08 NOTE — Assessment & Plan Note (Signed)
Place new referral back to Dr. Rodell Perna.

## 2022-02-08 NOTE — Patient Instructions (Signed)
Let me know if any of the medications are different.  Then we can make an anjustment.

## 2022-02-15 ENCOUNTER — Ambulatory Visit (INDEPENDENT_AMBULATORY_CARE_PROVIDER_SITE_OTHER): Payer: Medicare HMO

## 2022-02-15 DIAGNOSIS — R053 Chronic cough: Secondary | ICD-10-CM | POA: Diagnosis not present

## 2022-02-15 DIAGNOSIS — J479 Bronchiectasis, uncomplicated: Secondary | ICD-10-CM | POA: Diagnosis not present

## 2022-02-15 DIAGNOSIS — J470 Bronchiectasis with acute lower respiratory infection: Secondary | ICD-10-CM

## 2022-02-15 LAB — I-STAT CREATININE (MANUAL ENTRY): Creatinine, Ser: 1.9 — AB (ref 0.50–1.10)

## 2022-02-15 MED ORDER — IOHEXOL 300 MG/ML  SOLN
100.0000 mL | Freq: Once | INTRAMUSCULAR | Status: AC | PRN
  Administered 2022-02-15: 60 mL via INTRAVENOUS

## 2022-02-16 ENCOUNTER — Encounter: Payer: Self-pay | Admitting: Orthopaedic Surgery

## 2022-02-16 ENCOUNTER — Ambulatory Visit: Payer: Medicare HMO | Admitting: Orthopaedic Surgery

## 2022-02-16 ENCOUNTER — Ambulatory Visit: Payer: Self-pay

## 2022-02-16 VITALS — BP 134/79 | HR 74 | Ht 72.0 in | Wt 159.0 lb

## 2022-02-16 DIAGNOSIS — G8929 Other chronic pain: Secondary | ICD-10-CM | POA: Diagnosis not present

## 2022-02-16 DIAGNOSIS — M5442 Lumbago with sciatica, left side: Secondary | ICD-10-CM | POA: Diagnosis not present

## 2022-02-16 DIAGNOSIS — M5441 Lumbago with sciatica, right side: Secondary | ICD-10-CM

## 2022-02-16 MED ORDER — AMOXICILLIN-POT CLAVULANATE 875-125 MG PO TABS: 1.0000 | ORAL_TABLET | Freq: Two times a day (BID) | ORAL | 0 refills | Status: DC

## 2022-02-16 NOTE — Progress Notes (Signed)
Hi Victor Price, the imaging showed what looks like a possible left lower lobe pneumonia.  Again it is a possible pneumonia with some bronchiectasis which we know you already have.  I am getting go ahead and put you on antibiotics but also like to get you in with a pulmonologist just to make sure that this is not a more unusual type of infection that may require a different type of treatment.  If you are okay with the referral then please let me know.
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
## 2022-02-18 ENCOUNTER — Telehealth: Payer: Self-pay | Admitting: *Deleted

## 2022-02-18 DIAGNOSIS — J470 Bronchiectasis with acute lower respiratory infection: Secondary | ICD-10-CM

## 2022-02-18 DIAGNOSIS — R053 Chronic cough: Secondary | ICD-10-CM

## 2022-02-18 MED ORDER — MECLIZINE HCL 25 MG PO TABS: 25.0000 mg | ORAL_TABLET | Freq: Three times a day (TID) | ORAL | 0 refills | Status: DC | PRN

## 2022-02-18 NOTE — Telephone Encounter (Signed)
Pt called and LVM requesting that I call him.  I returned his call and he stated that he would like to be referred to pulmonology

## 2022-02-22 DIAGNOSIS — X32XXXS Exposure to sunlight, sequela: Secondary | ICD-10-CM | POA: Diagnosis not present

## 2022-02-22 DIAGNOSIS — L814 Other melanin hyperpigmentation: Secondary | ICD-10-CM | POA: Diagnosis not present

## 2022-02-22 DIAGNOSIS — L821 Other seborrheic keratosis: Secondary | ICD-10-CM | POA: Diagnosis not present

## 2022-02-22 DIAGNOSIS — Z79899 Other long term (current) drug therapy: Secondary | ICD-10-CM | POA: Diagnosis not present

## 2022-02-22 DIAGNOSIS — Z85828 Personal history of other malignant neoplasm of skin: Secondary | ICD-10-CM | POA: Diagnosis not present

## 2022-02-22 DIAGNOSIS — L570 Actinic keratosis: Secondary | ICD-10-CM | POA: Diagnosis not present

## 2022-02-22 DIAGNOSIS — L700 Acne vulgaris: Secondary | ICD-10-CM | POA: Diagnosis not present

## 2022-02-22 DIAGNOSIS — D1801 Hemangioma of skin and subcutaneous tissue: Secondary | ICD-10-CM | POA: Diagnosis not present

## 2022-02-22 DIAGNOSIS — Z129 Encounter for screening for malignant neoplasm, site unspecified: Secondary | ICD-10-CM | POA: Diagnosis not present

## 2022-02-23 ENCOUNTER — Encounter: Payer: Self-pay | Admitting: Neurology

## 2022-02-24 DIAGNOSIS — K4090 Unilateral inguinal hernia, without obstruction or gangrene, not specified as recurrent: Secondary | ICD-10-CM | POA: Diagnosis not present

## 2022-02-25 ENCOUNTER — Other Ambulatory Visit: Payer: Self-pay | Admitting: Family Medicine

## 2022-02-25 DIAGNOSIS — N400 Enlarged prostate without lower urinary tract symptoms: Secondary | ICD-10-CM

## 2022-02-25 DIAGNOSIS — E785 Hyperlipidemia, unspecified: Secondary | ICD-10-CM

## 2022-02-25 DIAGNOSIS — G4700 Insomnia, unspecified: Secondary | ICD-10-CM

## 2022-03-01 DIAGNOSIS — N1831 Chronic kidney disease, stage 3a: Secondary | ICD-10-CM | POA: Diagnosis not present

## 2022-03-12 ENCOUNTER — Telehealth: Payer: Self-pay

## 2022-03-12 NOTE — Telephone Encounter (Signed)
Thanks for trying, I am not just going to call in an inhaler without evaluating somebody with shortness of breath.  Glad they are going to seek care.

## 2022-03-12 NOTE — Telephone Encounter (Signed)
Victor Price called and states he has shortness of breath and would like an inhaler. He states he just had pneumonia and believes it has returned. I advised him he should be evaluated. He states he doesn't want to go to the urgent care. I advised of the importance of being evaluated. He states he will go somewhere.

## 2022-03-13 ENCOUNTER — Ambulatory Visit (INDEPENDENT_AMBULATORY_CARE_PROVIDER_SITE_OTHER): Payer: Medicare HMO

## 2022-03-13 ENCOUNTER — Ambulatory Visit
Admission: RE | Admit: 2022-03-13 | Discharge: 2022-03-13 | Disposition: A | Discharge status: Home / Self Care | Payer: Medicare HMO | Source: Ambulatory Visit | Attending: Family Medicine | Admitting: Family Medicine

## 2022-03-13 VITALS — BP 137/85 | HR 105 | Temp 99.1°F | Resp 18 | Ht 72.0 in | Wt 159.0 lb

## 2022-03-13 DIAGNOSIS — Z8709 Personal history of other diseases of the respiratory system: Secondary | ICD-10-CM | POA: Diagnosis not present

## 2022-03-13 DIAGNOSIS — R059 Cough, unspecified: Secondary | ICD-10-CM | POA: Diagnosis not present

## 2022-03-13 DIAGNOSIS — J189 Pneumonia, unspecified organism: Secondary | ICD-10-CM

## 2022-03-13 DIAGNOSIS — J470 Bronchiectasis with acute lower respiratory infection: Secondary | ICD-10-CM

## 2022-03-13 DIAGNOSIS — R0602 Shortness of breath: Secondary | ICD-10-CM

## 2022-03-13 MED ORDER — AMOXICILLIN-POT CLAVULANATE 875-125 MG PO TABS: 1.0000 | ORAL_TABLET | Freq: Two times a day (BID) | ORAL | 0 refills | Status: DC

## 2022-03-13 MED ORDER — ALBUTEROL SULFATE HFA 108 (90 BASE) MCG/ACT IN AERS: 1.0000 | INHALATION_SPRAY | Freq: Four times a day (QID) | RESPIRATORY_TRACT | 0 refills | Status: DC | PRN

## 2022-03-13 MED ORDER — PREDNISONE 20 MG PO TABS: ORAL_TABLET | ORAL | 0 refills | Status: DC

## 2022-03-13 MED ORDER — AZITHROMYCIN 250 MG PO TABS: ORAL_TABLET | ORAL | 0 refills | Status: DC

## 2022-03-13 NOTE — ED Triage Notes (Signed)
Productive cough has become worse Pt states he has had pnuemonia in the past  (thinks this is # 17) Has an appointment w/ pulmonary on Wed  Finished antibiotics for recent pneumonia this past Wed or Thursday

## 2022-03-13 NOTE — ED Provider Notes (Signed)
[facility]    CSN: [id number] Arrival date & time: 03/13/22  7425      History   Chief Complaint Chief Complaint  Patient presents with   Cough    HPI Victor Price is a 71 y.o. male.   HPI  Pleasant 71 year old gentleman with known bronchiectasis.  He has recurrent bronchitis and pneumonia.  He was diagnosed with pneumonia on 02/15/2022 on CT scan.  He was treated with a week of Augmentin.  He felt better while on the Augmentin, but as soon as he stopped it the symptoms came back.  He is having increasing coughing, chest pain, and shortness of breath.  His symptoms feel more left-sided than right.  He states his left base is where he has been told the bronchiectasis is the worst.  He states he gets fevers off-and-on. Patient states he usually responds to antibiotics, prednisone, and inhaler  Past Medical History:  Diagnosis Date   BPH (benign prostatic hyperplasia)    Bronchiectasis with acute lower respiratory infection ([place]) 08/13/2019   Bronchitis    inhaler prn   Cancer ([place])    skin cancer?   ED (erectile dysfunction)    GERD (gastroesophageal reflux disease)    HLD (hyperlipidemia)    Hypertension    Pneumonia    gets recurrent PNA in left lung, recent dx 08/13/19 left lower lobe   Seasonal allergies     Patient Active Problem List   Diagnosis Date Noted   Left inguinal hernia 01/27/2022   Carpal tunnel syndrome, left upper limb 12/04/2019   H/O total hip arthroplasty, left 10/19/2019   DDD (degenerative disc disease), lumbar 06/05/2019   Subclinical hyperthyroidism 10/31/2017   Hypogonadism in male 10/31/2017   CKD (chronic kidney disease) stage 3, GFR 30-59 ml/min (HCC) 02/08/2017   Bronchiectasis ([place]) 07/20/2016   Radiculitis of right cervical region 08/28/2014   DDD (degenerative disc disease), cervical 08/28/2014   Insomnia 04/30/2014   Peripheral neuropathy 04/02/2013   Herniated disc 01/29/2013   BPH (benign prostatic hyperplasia)  12/14/2011   ERECTILE DYSFUNCTION, ORGANIC 07/23/2010   THYROID NODULE, LEFT 05/23/2007   Hyperlipidemia 05/17/2006   HYPERTENSION, BENIGN SYSTEMIC 05/17/2006    Past Surgical History:  Procedure Laterality Date   BACK SURGERY  1980s   [facility]   COLONOSCOPY  2004, 2012   several - polyps   MULTIPLE TOOTH EXTRACTIONS     TOTAL HIP ARTHROPLASTY Left 08/24/2019   Procedure: LEFT TOTAL HIP ARTHROPLASTY-DIRECT ANTERIOR;  Surgeon: Marybelle Killings, MD;  Location: [place];  Service: Orthopedics;  Laterality: Left;       Home Medications    Prior to Admission medications   Medication Sig Start Date End Date Taking? Authorizing Provider  albuterol (VENTOLIN HFA) 108 (90 Base) MCG/ACT inhaler Inhale 1-2 puffs into the lungs every 6 (six) hours as needed for wheezing or shortness of breath. 03/13/22  Yes Raylene Everts, MD  amoxicillin-clavulanate (AUGMENTIN) 875-125 MG tablet Take 1 tablet by mouth every 12 (twelve) hours. 03/13/22  Yes Raylene Everts, MD  azithromycin (ZITHROMAX Z-PAK) 250 MG tablet Take 2 pills today, then 1 a day until finished 03/13/22  Yes Raylene Everts, MD  predniSONE (DELTASONE) 20 MG tablet Take 2 pills a day for 5 days.  Take with breakfast.  Then take 1 pill a day for 5 days.  Then discontinue 03/13/22  Yes Raylene Everts, MD  atorvastatin (LIPITOR) 40 MG tablet TAKE 1 TABLET BY MOUTH EVERYDAY AT BEDTIME  02/26/22   Hali Marry, MD  CLARAVIS 40 MG capsule Take 40 mg by mouth 2 (two) times daily. 02/26/22   [provider]  [facility] ALLERGY RELIEF-D 10-240 MG 24 hr tablet TAKE 1 TABLET BY MOUTH EVERY DAY 08/24/21   Hali Marry, MD  doxazosin (CARDURA) 1 MG tablet TAKE 1 TABLET BY MOUTH EVERY DAY FOR PROSTATE 02/26/22   Hali Marry, MD  Lidocaine-Hydrocortisone Ace (ANAMANTLE HC) 3-0.5 % KIT Apply 1 dose rectally up to  Bid prn for spasm. 10/11/19   Hali Marry, MD  losartan-hydrochlorothiazide (HYZAAR) 100-25 MG tablet TAKE 1  TABLET BY MOUTH EVERY DAY 12/28/21   Hali Marry, MD  meclizine (ANTIVERT) 25 MG tablet Take 1 tablet (25 mg total) by mouth 3 (three) times daily as needed for dizziness. 02/18/22   Hali Marry, MD  metoprolol succinate (TOPROL-XL) 25 MG 24 hr tablet TAKE 1 TABLET BY MOUTH DAILY. IF BLOOD SUGAR > 130 AFTER 1 WEEK PLEASE INCREASE TO 2 TABS DAILY. 12/28/21   Hali Marry, MD  Multiple Vitamin (MULTIVITAMIN) tablet Take 1 tablet by mouth daily.    [provider]  mupirocin ointment (BACTROBAN) 2 % Apply topically 3 (three) times daily. Patient not taking: Reported on 03/13/2022 09/11/20   Hali Marry, MD  pantoprazole (PROTONIX) 40 MG tablet TAKE 1 TABLET BY MOUTH EVERY DAY 08/25/21   Hali Marry, MD  sildenafil (REVATIO) 20 MG tablet TAKE 2 TO 5 TABLETS BY  MOUTH DAILY AS NEEDED 01/06/22   Hali Marry, MD  tacrolimus (PROTOPIC) 0.1 % ointment Apply 1 application. topically 2 (two) times daily. 11/17/21   [provider]  tamsulosin (FLOMAX) 0.4 MG CAPS capsule TAKE 1 CAPSULE (0.4 MG TOTAL) BY MOUTH IN THE MORNING AND AT BEDTIME 12/28/21   Hali Marry, MD  Testosterone 10 MG/ACT (2%) GEL APPLY 1 PUMP TO AN INNER THIGH AND 2 PUMPS TO OPPOSITE INNER THIGH IN AM. Harlene Salts. 11/16/21   Hali Marry, MD  traZODone (DESYREL) 50 MG tablet TAKE 1/2 TO 2 TABLETS BY MOUTH AT BEDTIME AS NEEDED SLEEP 02/26/22   Hali Marry, MD  triamcinolone cream (KENALOG) 0.1 % Apply topically 2 (two) times daily. Patient not taking: Reported on 03/13/2022 10/19/21   [provider]    Family History Family History  Problem Relation Age of Onset   Dementia Mother    Healthy Father     Social History Social History   Tobacco Use   Smoking status: Never   Smokeless tobacco: Never  Vaping Use   Vaping Use: Never used  Substance Use Topics   Alcohol use: No   Drug use: No     Allergies   Patient has no known  allergies.   Review of Systems Review of Systems See HPI  Physical Exam Triage Vital Signs ED Triage Vitals  Enc Vitals Group     BP 03/13/22 0828 137/85     Pulse Rate 03/13/22 0828 (!) 105     Resp 03/13/22 0828 18     Temp 03/13/22 0828 99.1 F (37.3 C)     Temp Source 03/13/22 0828 Oral     SpO2 03/13/22 0828 99 %     Weight 03/13/22 0830 159 lb (72.1 kg)     Height 03/13/22 0830 6' (1.829 m)     Head Circumference --      Peak Flow --      Pain Score 03/13/22 0829  4     Pain Loc --      Pain Edu? --      Excl. in [place]? --    No data found.  Updated Vital Signs BP 137/85 (BP Location: Left Arm)   Pulse (!) 105   Temp 99.1 F (37.3 C) (Oral)   Resp 18   Ht 6' (1.829 m)   Wt 72.1 kg   SpO2 99%   BMI 21.56 kg/m      Physical Exam Constitutional:      General: He is not in acute distress.    Appearance: He is well-developed.     Comments: Lean in appearance.  Mildly dyspneic  HENT:     Head: Normocephalic and atraumatic.  Eyes:     Conjunctiva/sclera: Conjunctivae normal.     Pupils: Pupils are equal, round, and reactive to light.  Cardiovascular:     Rate and Rhythm: Normal rate and regular rhythm.     Heart sounds: Normal heart sounds.  Pulmonary:     Effort: Pulmonary effort is normal. No respiratory distress.     Breath sounds: Rhonchi present.     Comments: Patient has significant coarse rhonchi left greater than right Abdominal:     General: There is no distension.     Palpations: Abdomen is soft.  Musculoskeletal:        General: Normal range of motion.     Cervical back: Normal range of motion.  Skin:    General: Skin is warm and dry.  Neurological:     Mental Status: He is alert.  Psychiatric:        Mood and Affect: Mood normal.        Behavior: Behavior normal.      UC Treatments / Results  Labs (all labs ordered are listed, but only abnormal results are displayed) Labs Reviewed - No data to display  EKG   Radiology DG  Chest 2 View  Result Date: 03/13/2022 CLINICAL DATA:  Cough and shortness of breath. History of bronchiectasis. EXAM: CHEST - 2 VIEW COMPARISON:  CT chest 02/15/2022 FINDINGS: Heart size and mediastinal contours are unremarkable. No pleural effusion identified. Reticular interstitial opacities within the left lower lobe identified compatible with known bronchiectasis. Mild increased retrocardiac opacity may represent superimposed airspace consolidation and or aspiration. Right lung appears clear. IMPRESSION: 1. Left lower lobe bronchiectasis. 2. Increased retrocardiac opacity may represent superimposed airspace consolidation and/or aspiration. Electronically Signed   By: Kerby Moors M.D.   On: 03/13/2022 09:13    Procedures Procedures (including critical care time)  Medications Ordered in UC Medications - No data to display  Initial Impression / Assessment and Plan / UC Course  I have reviewed the triage vital signs and the nursing notes.  Pertinent labs & imaging results that were available during my care of the patient were reviewed by me and considered in my medical decision making (see chart for details).     Patient is symptomatic with increased cough, productive sputum, chest pain and shortness of breath.  Going to treat him for community-acquired pneumonia.  He has a pulmonary follow-up in 4 days.  Call sooner for problems Final Clinical Impressions(s) / UC Diagnoses   Final diagnoses:  Community acquired pneumonia, unspecified laterality  Bronchiectasis with acute lower respiratory infection ([place])     Discharge Instructions      Make sure you are drinking plenty of fluids Take the antibiotics as directed Prednisone has been prescribed for 10 days Use your  inhaler as needed Some people develop stomach upset or diarrhea on antibiotics.  Consider a probiotic if you are sensitive Follow-up with your primary care doctor or pulmonology specialist     ED Prescriptions      Medication Sig Dispense Auth. Provider   albuterol (VENTOLIN HFA) 108 (90 Base) MCG/ACT inhaler Inhale 1-2 puffs into the lungs every 6 (six) hours as needed for wheezing or shortness of breath. 18 g Raylene Everts, MD   predniSONE (DELTASONE) 20 MG tablet Take 2 pills a day for 5 days.  Take with breakfast.  Then take 1 pill a day for 5 days.  Then discontinue 15 tablet Raylene Everts, MD   amoxicillin-clavulanate (AUGMENTIN) 875-125 MG tablet Take 1 tablet by mouth every 12 (twelve) hours. 28 tablet Raylene Everts, MD   azithromycin (ZITHROMAX Z-PAK) 250 MG tablet Take 2 pills today, then 1 a day until finished 6 tablet Meda Coffee Jennette Banker, MD      PDMP not reviewed this encounter.   Raylene Everts, MD 03/13/22 [phone]

## 2022-03-13 NOTE — Discharge Instructions (Signed)
Make sure you are drinking plenty of fluids Take the antibiotics as directed Prednisone has been prescribed for 10 days Use your inhaler as needed Some people develop stomach upset or diarrhea on antibiotics.  Consider a probiotic if you are sensitive Follow-up with your primary care doctor or pulmonology specialist

## 2022-03-14 ENCOUNTER — Telehealth: Payer: Self-pay | Admitting: Emergency Medicine

## 2022-03-14 NOTE — Telephone Encounter (Signed)
[name].  Advised if doing well to disregard call, any questions or concerns to contact the office.

## 2022-03-17 ENCOUNTER — Ambulatory Visit (INDEPENDENT_AMBULATORY_CARE_PROVIDER_SITE_OTHER): Payer: Medicare HMO | Admitting: Pulmonary Disease

## 2022-03-17 ENCOUNTER — Encounter: Payer: Self-pay | Admitting: Pulmonary Disease

## 2022-03-17 VITALS — BP 118/62 | HR 88 | Temp 98.6°F | Ht 72.0 in | Wt 152.4 lb

## 2022-03-17 DIAGNOSIS — J479 Bronchiectasis, uncomplicated: Secondary | ICD-10-CM | POA: Diagnosis not present

## 2022-03-17 MED ORDER — ANORO ELLIPTA 62.5-25 MCG/ACT IN AEPB: 1.0000 | INHALATION_SPRAY | Freq: Every day | RESPIRATORY_TRACT | 2 refills | Status: DC

## 2022-03-17 NOTE — Patient Instructions (Addendum)
Nice to meet you  Use Anoro 1 puff once a day  Continue postural drainage and exercise  We will get sputum cups - please bring back if cough gets worse  Let us know so we can call in antibiotics  Return clinic as needed

## 2022-03-17 NOTE — Progress Notes (Signed)
@Patient  ID: Victor Price, male    DOB: 1951/04/03, 71 y.o.   MRN: [id number]  Chief Complaint  Patient presents with   Consult    Pt is here for consult in regards to multiple episodes of PNA. Pt states he has had PNA 16 times. Ct done last month. Pt is currenlty on abt and prednisone due to him having yet another round of PNA. Pt states that he does have a mild cough.     Referring provider: Hali Marry, *  HPI:   71 y.o. whom we are seeing in consultation for evaluation of bronchiectasis.  Note from referring provider reviewed.  Recent urgent care note via care everywhere reviewed.  Patient's had recurrent "pneumonia" for 20 years.  Characterized by fever, foul taste in sputum.  Never hospitalized.  Not recall ever needing oxygen.  Still he has bronchiectasis.  This is likely why this keeps happening.  Reviewed most recent chest imaging x-ray 03/13/2022 read demonstrates hyperinflation otherwise clear lungs on my interpretation.  Reviewed most recent CT chest 02/16/2022 that on my review reveals clear lungs, evidence of hyperinflation, localized left lower lobe bronchiectasis on my interpretation.  Coronary clearance includes postural drainage and have coughing with exercise.  Does not use hypertonic saline or flutter valve.  Review of cultures indicates history of H. influenzae 2018, strep pneumo 2019.  No more recent cultures.  PMH: Hypertension, hyperlipidemia, Surgical history: Back surgery in the 80s Family history: Denies wrist or elbow first relatives Social history: Never smoker, lives in [place] / Pulmonary Flowsheets:   ACT:      No data to display          MMRC:     No data to display          Epworth:      No data to display          Tests:   FENO:  No results found for: "NITRICOXIDE"  PFT:     No data to display          WALK:      No data to display          Imaging: Personally reviewed and as  per EMR discussion this note DG Chest 2 View  Result Date: 03/13/2022 CLINICAL DATA:  Cough and shortness of breath. History of bronchiectasis. EXAM: CHEST - 2 VIEW COMPARISON:  CT chest 02/15/2022 FINDINGS: Heart size and mediastinal contours are unremarkable. No pleural effusion identified. Reticular interstitial opacities within the left lower lobe identified compatible with known bronchiectasis. Mild increased retrocardiac opacity may represent superimposed airspace consolidation and or aspiration. Right lung appears clear. IMPRESSION: 1. Left lower lobe bronchiectasis. 2. Increased retrocardiac opacity may represent superimposed airspace consolidation and/or aspiration. Electronically Signed   By: Kerby Moors M.D.   On: 03/13/2022 09:13    Lab Results: Personally reviewed CBC    Component Value Date/Time   WBC 7.4 05/14/2021 0912   RBC 4.88 05/14/2021 0912   HGB 15.5 05/14/2021 0912   HCT 46.2 05/14/2021 0912   PLT 238 05/14/2021 0912   MCV 94.7 05/14/2021 0912   MCH 31.8 05/14/2021 0912   MCHC 33.5 05/14/2021 0912   RDW 13.1 05/14/2021 0912   LYMPHSABS 3,754 06/27/2018 0954   MONOABS 1,551 (H) 07/20/2016 1100   EOSABS 114 06/27/2018 0954   BASOSABS 42 06/27/2018 0954    BMET    Component Value Date/Time   NA 140 11/03/2021 0000  K 5.8 (H) 11/03/2021 0000   CL 101 11/03/2021 0000   CO2 30 11/03/2021 0000   GLUCOSE 73 11/03/2021 0000   BUN 20 11/03/2021 0000   CREATININE 1.90 (A) 02/15/2022 1406   CREATININE 1.68 (H) 11/03/2021 0000   CALCIUM 10.4 (H) 11/03/2021 0000   GFRNONAA 51 (L) 10/14/2020 0000   GFRAA 59 (L) 10/14/2020 0000    BNP No results found for: "BNP"  ProBNP No results found for: "PROBNP"  Specialty Problems       Pulmonary Problems   Bronchiectasis ([place])    No Known Allergies  Immunization History  Administered Date(s) Administered   Fluad Quad(high Dose 65+) 04/09/2020, 05/11/2021   Influenza Split 05/17/2012   Influenza Whole  05/10/2005, 04/22/2011   Influenza, High Dose Seasonal PF 03/28/2019   Influenza,inj,Quad PF,6+ Mos 04/02/2013, 05/28/2015   Influenza-Unspecified 04/22/2016, 04/30/2017, 05/16/2018   PFIZER(Purple Top)SARS-COV-2 Vaccination 10/01/2019, 10/22/2019   PNEUMOCOCCAL CONJUGATE-20 05/11/2021   Pneumococcal Polysaccharide-23 05/10/2005   Td 08/11/1998   Tdap 11/02/2011   Zoster Recombinat (Shingrix) 12/30/2019, 02/29/2020   Zoster, Live 11/19/2011    Past Medical History:  Diagnosis Date   BPH (benign prostatic hyperplasia)    Bronchiectasis with acute lower respiratory infection ([place]) 08/13/2019   Bronchitis    inhaler prn   Cancer ([place])    skin cancer?   ED (erectile dysfunction)    GERD (gastroesophageal reflux disease)    HLD (hyperlipidemia)    Hypertension    Pneumonia    gets recurrent PNA in left lung, recent dx 08/13/19 left lower lobe   Seasonal allergies     Tobacco History: Social History   Tobacco Use  Smoking Status Never  Smokeless Tobacco Never   Counseling given: Not Answered   Continue to not smoke  Outpatient Encounter Medications as of 03/17/2022  Medication Sig   albuterol (VENTOLIN HFA) 108 (90 Base) MCG/ACT inhaler Inhale 1-2 puffs into the lungs every 6 (six) hours as needed for wheezing or shortness of breath.   amoxicillin-clavulanate (AUGMENTIN) 875-125 MG tablet Take 1 tablet by mouth every 12 (twelve) hours.   atorvastatin (LIPITOR) 40 MG tablet TAKE 1 TABLET BY MOUTH EVERYDAY AT BEDTIME   CLARAVIS 40 MG capsule Take 40 mg by mouth 2 (two) times daily.   [employer] ALLERGY RELIEF-D 10-240 MG 24 hr tablet TAKE 1 TABLET BY MOUTH EVERY DAY   doxazosin (CARDURA) 1 MG tablet TAKE 1 TABLET BY MOUTH EVERY DAY FOR PROSTATE   Lidocaine-Hydrocortisone Ace (ANAMANTLE HC) 3-0.5 % KIT Apply 1 dose rectally up to  Bid prn for spasm.   losartan-hydrochlorothiazide (HYZAAR) 100-25 MG tablet TAKE 1 TABLET BY MOUTH EVERY DAY   meclizine (ANTIVERT) 25 MG tablet Take 1  tablet (25 mg total) by mouth 3 (three) times daily as needed for dizziness.   metoprolol succinate (TOPROL-XL) 25 MG 24 hr tablet TAKE 1 TABLET BY MOUTH DAILY. IF BLOOD SUGAR > 130 AFTER 1 WEEK PLEASE INCREASE TO 2 TABS DAILY.   Multiple Vitamin (MULTIVITAMIN) tablet Take 1 tablet by mouth daily.   mupirocin ointment (BACTROBAN) 2 % Apply topically 3 (three) times daily.   pantoprazole (PROTONIX) 40 MG tablet TAKE 1 TABLET BY MOUTH EVERY DAY   predniSONE (DELTASONE) 20 MG tablet Take 2 pills a day for 5 days.  Take with breakfast.  Then take 1 pill a day for 5 days.  Then discontinue   sildenafil (REVATIO) 20 MG tablet TAKE 2 TO 5 TABLETS BY  MOUTH DAILY AS NEEDED  tacrolimus (PROTOPIC) 0.1 % ointment Apply 1 application. topically 2 (two) times daily.   tamsulosin (FLOMAX) 0.4 MG CAPS capsule TAKE 1 CAPSULE (0.4 MG TOTAL) BY MOUTH IN THE MORNING AND AT BEDTIME   Testosterone 10 MG/ACT (2%) GEL APPLY 1 PUMP TO AN INNER THIGH AND 2 PUMPS TO OPPOSITE INNER THIGH IN AM. FORTESTA.   traZODone (DESYREL) 50 MG tablet TAKE 1/2 TO 2 TABLETS BY MOUTH AT BEDTIME AS NEEDED SLEEP   triamcinolone cream (KENALOG) 0.1 % Apply topically 2 (two) times daily.   umeclidinium-vilanterol (ANORO ELLIPTA) 62.5-25 MCG/ACT AEPB Inhale 1 puff into the lungs daily.   [DISCONTINUED] azithromycin (ZITHROMAX Z-PAK) 250 MG tablet Take 2 pills today, then 1 a day until finished   No facility-administered encounter medications on file as of 03/17/2022.     Review of Systems  Review of Systems  No chest pain with exertion.  No orthopnea or PND.  No lower extremity swelling.  Comprehensive review of systems otherwise negative. Physical Exam  BP 118/62 (BP Location: Left Arm, Patient Position: Sitting, Cuff Size: Normal)   Pulse 88   Temp 98.6 F (37 C) (Oral)   Ht 6' (1.829 m)   Wt 152 lb 6.4 oz (69.1 kg)   SpO2 97%   BMI 20.67 kg/m   Wt Readings from Last 5 Encounters:  03/17/22 152 lb 6.4 oz (69.1 kg)  03/13/22  159 lb (72.1 kg)  02/16/22 159 lb (72.1 kg)  02/08/22 159 lb (72.1 kg)  01/27/22 163 lb (73.9 kg)    BMI Readings from Last 5 Encounters:  03/17/22 20.67 kg/m  03/13/22 21.56 kg/m  02/16/22 21.56 kg/m  02/08/22 21.56 kg/m  01/27/22 22.11 kg/m     Physical Exam General: Thin, no acute distress Eyes: EOMI, no icterus Neck: Supple, no JVP Pulmonary: Rhonchi in the left base, otherwise clear, normal work of breathing Cardiovascular regular rate and rhythm, no murmur Abdomen: Nondistended, bowel sounds present MSK: No synovitis, no no joint effusion Neuro: Normal gait, no weakness Psych: Normal mood, full affect   Assessment & Plan:   Left lower lobe bronchiectasis: Present for at least 20 years per his report.  Suspect sequela of old infection.  Denies aspiration or GERD symptoms.  Localized left lower lobe.  Low suspicion for other etiologies of bronchiectasis.  Occasionally flares about once a year.  More recently.  Encourage possible drainage in half coughing with exertion.  Consider addition of hypertonic saline in the future.  Aggressive antibiotics early.  H. influenzae and strep pneumo in the past on sputum culture although these were remote.  Repeat sputum culture soon.   Return if symptoms worsen or fail to improve.   Lanier Clam, MD 03/17/2022

## 2022-03-23 ENCOUNTER — Ambulatory Visit (INDEPENDENT_AMBULATORY_CARE_PROVIDER_SITE_OTHER): Payer: Medicare HMO | Admitting: Family Medicine

## 2022-03-23 ENCOUNTER — Encounter: Payer: Self-pay | Admitting: Family Medicine

## 2022-03-23 VITALS — BP 132/80 | HR 116 | Ht 72.0 in | Wt 151.0 lb

## 2022-03-23 DIAGNOSIS — I10 Essential (primary) hypertension: Secondary | ICD-10-CM

## 2022-03-23 DIAGNOSIS — J470 Bronchiectasis with acute lower respiratory infection: Secondary | ICD-10-CM | POA: Diagnosis not present

## 2022-03-23 DIAGNOSIS — N1831 Chronic kidney disease, stage 3a: Secondary | ICD-10-CM | POA: Diagnosis not present

## 2022-03-23 DIAGNOSIS — T50905A Adverse effect of unspecified drugs, medicaments and biological substances, initial encounter: Secondary | ICD-10-CM

## 2022-03-23 NOTE — Assessment & Plan Note (Signed)
He does feel better with the addition of Anoro.  Continue current regimen.

## 2022-03-23 NOTE — Patient Instructions (Signed)
Due for labs in October.

## 2022-03-23 NOTE — Assessment & Plan Note (Signed)
Avoid NSAIDs and PPIs.  Continue Farxiga.  Plan to keep follow-up in 6 months.

## 2022-03-23 NOTE — Progress Notes (Signed)
   Established Patient Office Visit  Subjective   Patient ID: Victor Price, male    DOB: [birth year]  Age: 71 y.o. MRN: [id number]  Chief Complaint  Patient presents with   Follow-up    HPI  Hypertension- Pt denies chest pain, SOB, dizziness, or heart palpitations.  Taking meds as directed w/o problems.  Denies medication side effects.  The metoprolol was causing so it was discontinued.  Blood pressures in the 130s over 60s at home.  He finally feels better after recent respiratory infection and he ended up going to urgent care and was treated with Augmentin.  He did follow-up with the pulmonologist they are recommending follow-up sputum cultures but he is feeling significantly better.  Pneumonia vaccine is up-to-date.  Also reports not sleeping well he has been getting about 4 to 5 hours per night.  He wakes up tired but usually once he gets going he actually feels okay.  He is not napping during the day.  No recent changes.  Did see the nephrologist, Dr. Loura Back at [facility], and was recently started on Farxiga.  Additional labs done.  Told to avoid NSAIDs.  Recommended strongly to keep target blood pressure less than 140/90.  Also encouraged to discontinue his Protonix.    ROS    Objective:     BP 132/80   Pulse (!) 116   Ht 6' (1.829 m)   Wt 151 lb (68.5 kg)   SpO2 99%   BMI 20.48 kg/m    Physical Exam Constitutional:      Appearance: He is well-developed.  HENT:     Head: Normocephalic and atraumatic.  Cardiovascular:     Rate and Rhythm: Normal rate and regular rhythm.     Heart sounds: Normal heart sounds.  Pulmonary:     Effort: Pulmonary effort is normal.     Comments: Diffuse coarse ronchi Skin:    General: Skin is warm and dry.  Neurological:     Mental Status: He is alert and oriented to person, place, and time.  Psychiatric:        Behavior: Behavior normal.      No results found for any visits on  03/23/22.    The 10-year ASCVD risk score (Arnett DK, et al., 2019) is: 14.4%    Assessment & Plan:   Problem List Items Addressed This Visit       Cardiovascular and Mediastinum   HYPERTENSION, BENIGN SYSTEMIC - Primary    Well controlled. Continue current regimen. Follow up in  39mo        Respiratory   [place]ctasis [place]    He does feel better with the addition of Anoro.  Continue current regimen.        Genitourinary   CKD (chronic kidney disease) stage 3, GFR 30-59 ml/min (HCC)    Avoid NSAIDs and PPIs.  Continue Farxiga.  Plan to keep follow-up in 6 months.      Other Visit Diagnoses     Medication side effect, initial encounter          Medication side effect-metoprolol added to medication side effect list  Return in about 6 months (around 09/23/2022) for Hypertension.    CBeatrice Lecher MD

## 2022-03-23 NOTE — Assessment & Plan Note (Signed)
Well controlled. Continue current regimen. Follow up in  6 mo  

## 2022-04-07 DIAGNOSIS — N1831 Chronic kidney disease, stage 3a: Secondary | ICD-10-CM | POA: Diagnosis not present

## 2022-04-13 ENCOUNTER — Telehealth: Payer: Self-pay

## 2022-04-13 NOTE — Telephone Encounter (Signed)
Scheduled him for a mychart video for tomorrow- tvt

## 2022-04-13 NOTE — Telephone Encounter (Signed)
Patient left a vm msg on medical assistant's line. Per patient, tested positive for Covid.   Please contact the patient to schedule a virtual appointment for an evaluation. Thanks in advance.

## 2022-04-14 ENCOUNTER — Telehealth (INDEPENDENT_AMBULATORY_CARE_PROVIDER_SITE_OTHER): Payer: Medicare HMO | Admitting: Family Medicine

## 2022-04-14 ENCOUNTER — Encounter: Payer: Self-pay | Admitting: Family Medicine

## 2022-04-14 ENCOUNTER — Other Ambulatory Visit: Payer: Self-pay | Admitting: Family Medicine

## 2022-04-14 VITALS — BP 117/73

## 2022-04-14 DIAGNOSIS — N400 Enlarged prostate without lower urinary tract symptoms: Secondary | ICD-10-CM

## 2022-04-14 DIAGNOSIS — I10 Essential (primary) hypertension: Secondary | ICD-10-CM

## 2022-04-14 DIAGNOSIS — E785 Hyperlipidemia, unspecified: Secondary | ICD-10-CM | POA: Diagnosis not present

## 2022-04-14 DIAGNOSIS — T50905A Adverse effect of unspecified drugs, medicaments and biological substances, initial encounter: Secondary | ICD-10-CM

## 2022-04-14 DIAGNOSIS — U071 COVID-19: Secondary | ICD-10-CM | POA: Diagnosis not present

## 2022-04-14 MED ORDER — BISOPROLOL-HYDROCHLOROTHIAZIDE 10-6.25 MG PO TABS: 1.0000 | ORAL_TABLET | Freq: Every day | ORAL | 0 refills | Status: DC

## 2022-04-14 NOTE — Progress Notes (Signed)
Virtual Visit via Video Note  I connected with Victor Price on 04/14/22 at 10:50 AM EDT by a video enabled telemedicine application and verified that I am speaking with the correct person using two identifiers.   I discussed the limitations of evaluation and management by telemedicine and the availability of in person appointments. The patient expressed understanding and agreed to proceed.  Patient location: at home Provider location: in office  Subjective:    CC:   Chief Complaint  Patient presents with   Covid Positive    HPI: Sxs started on Sunday x4 days ago.  Mostly started with a runny nose and slight increased cough but no shortness of breath he does have underlying pulmonary disease but does not feel like he is having a lot of chest symptoms.  No fevers, chills or sweats.  Not taking any over-the-counter cold medications.  He did test positive for COVID on Monday.  This is the second time he has had COVID.  Wanted to let me know about the new medication, doxazosin that I started him on because side effects.  He took it for about 3 weeks but was feeling significantly dizzy on the medication and even passed out in his garage.  He said decided just to take the bisoprolol HCTZ and has been tracking his blood pressures for the last 2 weeks on that medication he has been getting great blood pressures including 117/73, 113/66, 123/77.   Past medical history, Surgical history, Family history not pertinant except as noted below, Social history, Allergies, and medications have been entered into the medical record, reviewed, and corrections made.    Objective:    General: Speaking clearly in complete sentences without any shortness of breath.  Alert and oriented x3.  Normal judgment. No apparent acute distress.    Impression and Recommendations:    Problem List Items Addressed This Visit       Cardiovascular and Mediastinum   HYPERTENSION, BENIGN SYSTEMIC    Home blood  pressures look good did update his chart that losartan caused significant dizziness and lightheadedness.  Back on the bisoprolol HCTZ.  He says he has enough of the prescription currently sounds like home blood pressures are looking great and he is feeling better in that regard.      Relevant Medications   bisoprolol-hydrochlorothiazide (ZIAC) 10-6.25 MG tablet     Other   Hyperlipidemia   Relevant Medications   bisoprolol-hydrochlorothiazide (ZIAC) 10-6.25 MG tablet   Other Visit Diagnoses     COVID-19    -  Primary   Medication side effect, initial encounter          COVID 19 -  he is on day 3 of symptoms they are particularly mild.  We discussed options including the antiviral regimen.  He is high risk based on underlying pulmonary disease and age but his symptoms are quite mild at this point so we will opt to hold off on medication if symptoms worsen or change then please let us know.  Recommendations for quarantine  No orders of the defined types were placed in this encounter.   Meds ordered this encounter  Medications   bisoprolol-hydrochlorothiazide (ZIAC) 10-6.25 MG tablet    Sig: Take 1 tablet by mouth daily.    Dispense:  1 tablet    Refill:  0     I discussed the assessment and treatment plan with the patient. The patient was provided an opportunity to ask questions and all were answered.  The patient agreed with the plan and demonstrated an understanding of the instructions.   The patient was advised to call back or seek an in-person evaluation if the symptoms worsen or if the condition fails to improve as anticipated.   Beatrice Lecher, MD

## 2022-04-14 NOTE — Progress Notes (Signed)
Attempted to contact patient at 1053 am and 1108 am. Phone went directly to voicemail. Unable to leave a vm msg, vm is full.

## 2022-04-14 NOTE — Assessment & Plan Note (Signed)
Home blood pressures look good did update his chart that losartan caused significant dizziness and lightheadedness.  Back on the bisoprolol HCTZ.  He says he has enough of the prescription currently sounds like home blood pressures are looking great and he is feeling better in that regard.
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
## 2022-05-05 ENCOUNTER — Ambulatory Visit: Payer: Medicare HMO | Admitting: Neurology

## 2022-05-05 ENCOUNTER — Encounter: Payer: Self-pay | Admitting: Neurology

## 2022-05-05 ENCOUNTER — Other Ambulatory Visit (INDEPENDENT_AMBULATORY_CARE_PROVIDER_SITE_OTHER): Payer: Medicare HMO

## 2022-05-05 VITALS — BP 161/87 | HR 77 | Ht 72.0 in | Wt 148.0 lb

## 2022-05-05 DIAGNOSIS — R209 Unspecified disturbances of skin sensation: Secondary | ICD-10-CM | POA: Diagnosis not present

## 2022-05-05 DIAGNOSIS — R202 Paresthesia of skin: Secondary | ICD-10-CM

## 2022-05-05 DIAGNOSIS — R200 Anesthesia of skin: Secondary | ICD-10-CM

## 2022-05-05 DIAGNOSIS — M5417 Radiculopathy, lumbosacral region: Secondary | ICD-10-CM

## 2022-05-05 DIAGNOSIS — G629 Polyneuropathy, unspecified: Secondary | ICD-10-CM

## 2022-05-05 NOTE — Patient Instructions (Signed)
I saw you today for numbness and tingling in your feet. This could be from your back or from neuropathy or from both.  To help sort this out, I would like to do lab work today. I would also like to get an EMG (muscle and nerve test) to sort out where your symptoms are coming from.  I recommend over the counter lidocaine cream for the burning/tingling in your feet as needed. Wear gloves or it will make your hands numb.  I will be in touch after your labs result and when I have the results of your EMG. Please let me know if you have any questions or concerns in the meantime.  The physicians and staff at [facility] are committed to providing excellent care. You may receive a survey requesting feedback about your experience at our office. We strive to receive "very good" responses to the survey questions. If you feel that your experience would prevent you from giving the office a "very good " response, please contact our office to try to remedy the situation. We may be reached at [phone]. Thank you for taking the time out of your busy day to complete the survey.  Kai Levins, MD [facility]  ELECTROMYOGRAM AND NERVE CONDUCTION STUDIES (EMG/NCS) INSTRUCTIONS  How to Prepare The neurologist conducting the EMG will need to know if you have certain medical conditions. Tell the neurologist and other EMG lab personnel if you: Have a pacemaker or any other electrical medical device Take blood-thinning medications Have hemophilia, a blood-clotting disorder that causes prolonged bleeding Bathing Take a shower or bath shortly before your exam in order to remove oils from your skin. Don't apply lotions or creams before the exam.  What to Expect You'll likely be asked to change into a hospital gown for the procedure and lie down on an examination table. The following explanations can help you understand what will happen during the exam.  Electrodes. The neurologist or a technician places  surface electrodes at various locations on your skin depending on where you're experiencing symptoms. Or the neurologist may insert needle electrodes at different sites depending on your symptoms.  Sensations. The electrodes will at times transmit a tiny electrical current that you may feel as a twinge or spasm. The needle electrode may cause discomfort or pain that usually ends shortly after the needle is removed. If you are concerned about discomfort or pain, you may want to talk to the neurologist about taking a short break during the exam.  Instructions. During the needle EMG, the neurologist will assess whether there is any spontaneous electrical activity when the muscle is at rest - activity that isn't present in healthy muscle tissue - and the degree of activity when you slightly contract the muscle.  He or she will give you instructions on resting and contracting a muscle at appropriate times. Depending on what muscles and nerves the neurologist is examining, he or she may ask you to change positions during the exam.  After your EMG You may experience some temporary, minor bruising where the needle electrode was inserted into your muscle. This bruising should fade within several days. If it persists, contact your primary care doctor.

## 2022-05-06 LAB — HEMOGLOBIN A1C: Hgb A1c MFr Bld: 6 % (ref 4.6–6.5)

## 2022-05-06 LAB — VITAMIN B12: Vitamin B-12: >1500 pg/mL — ABNORMAL HIGH (ref 211–911)

## 2022-05-10 ENCOUNTER — Encounter: Payer: Self-pay | Admitting: Neurology

## 2022-05-10 LAB — IMMUNOFIXATION ELECTROPHORESIS
IgG (Immunoglobin G), Serum: 1236 mg/dL (ref 600–1540)
IgM, Serum: 179 mg/dL (ref 50–300)
Immunoglobulin A: 321 mg/dL — ABNORMAL HIGH (ref 70–320)

## 2022-05-31 DIAGNOSIS — L700 Acne vulgaris: Secondary | ICD-10-CM | POA: Diagnosis not present

## 2022-05-31 DIAGNOSIS — Z79899 Other long term (current) drug therapy: Secondary | ICD-10-CM | POA: Diagnosis not present

## 2022-06-18 ENCOUNTER — Telehealth: Payer: Self-pay

## 2022-06-18 NOTE — Telephone Encounter (Signed)
Called patient and left voicemail for him about the sputum sample. I ask him if he is still wanting to do the sputum sample to come by the office to gram his cups and the instructions. And I asked that he call the office back with any updates or questions. Nothing further needed at this time

## 2022-06-21 DIAGNOSIS — K4090 Unilateral inguinal hernia, without obstruction or gangrene, not specified as recurrent: Secondary | ICD-10-CM | POA: Diagnosis not present

## 2022-06-21 DIAGNOSIS — G8918 Other acute postprocedural pain: Secondary | ICD-10-CM | POA: Diagnosis not present

## 2022-07-05 ENCOUNTER — Encounter: Payer: Self-pay | Admitting: Neurology

## 2022-07-05 ENCOUNTER — Ambulatory Visit: Payer: Medicare HMO | Admitting: Neurology

## 2022-07-05 DIAGNOSIS — G629 Polyneuropathy, unspecified: Secondary | ICD-10-CM

## 2022-07-05 DIAGNOSIS — R200 Anesthesia of skin: Secondary | ICD-10-CM | POA: Diagnosis not present

## 2022-07-05 DIAGNOSIS — R202 Paresthesia of skin: Secondary | ICD-10-CM

## 2022-07-05 DIAGNOSIS — R209 Unspecified disturbances of skin sensation: Secondary | ICD-10-CM

## 2022-07-05 DIAGNOSIS — M5417 Radiculopathy, lumbosacral region: Secondary | ICD-10-CM

## 2022-07-05 NOTE — Procedures (Signed)
[facility]  [place], [place]  [place], [place] [address] Tel: [phone] Fax: [phone] Test Date:  07/05/2022  Patient: Victor Price DOB: [birth year] Physician: Kai Levins, MD  Sex: Male Height: '6\' 0"'$  Ref Phys: Kai Levins, MD  ID#: [id number]   Technician:    History: This is a 71 year old male with numbness and tingling in his feet.  NCV & EMG Findings: Extensive electrodiagnostic evaluation of the right lower limb with additional nerve conduction studies and needle examination of the right upper limb shows: Right sural, superficial peroneal/fibular, median, and ulnar sensory responses are absent. Right radial sensory response is borderline but within normal limits. Right peroneal/fibular (EDB) motor response is absent. Right tibial (AH) shows reduced amplitude (1.23 mV). Right median (APB) motor responses shows prolonged distal onset latency (4.6 ms) and reduced amplitude (4.5 mV). Right ulnar (ADM) motor response is within normal limits. Right H reflex is absent. Chronic motor axon loss changes with accompanying active denervation changes or increased spontaneous activity are seen in the right medial head of gastrocnemius and right short head of biceps femoris muscles. Chronic motor axon loss changes without accompanying active denervation changes are seen in the right tibialis anterior muscle.  Impression: This is an abnormal electrodiagnostic evaluation. The findings are most consistent with the following: Evidence of a generalized large fiber sensorimotor polyneuropathy, axon loss in type, moderate to severe in degree electrically. Evidence of an overlapping active/ongoing intraspinal canal lesion (ie: motor radiculopathy) at the right S1 root, moderate in degree electrically. Evidence of a right median mononeuropathy at or distal to the wrist, consistent with carpal tunnel syndrome. Given #1 above, it is difficult to assess the severity, but at least  moderate in degree electrically.   ___________________________ Kai Levins, MD    Nerve Conduction Studies Motor Nerve Results    Latency Amplitude F-Lat Segment Distance CV Comment  Site (ms) Norm (mV) Norm (ms)  (cm) (m/s) Norm   Right Fibular (EDB) Motor  Ankle *NR  < 6.0 *NR  > 2.5        Bel fib head *NR - *NR -  Bel fib head-Ankle - *NR  > 40   Pop fossa *NR - *NR -  Pop fossa-Bel fib head - *NR -   Right Fibular (TA) Motor  Fib head 3.2  < 4.5 4.0  > 3.0        Pop fossa 4.8  < 6.7 3.7 -  Pop fossa-Fib head 10 63  > 40   Right Median (APB) Motor  Wrist *4.6  < 4.0 *4.5  > 5.0        Elbow 11.0 - 4.2 -  Elbow-Wrist 30 *47  > 50   Right Tibial (AH) Motor  Ankle 5.4  < 6.0 *1.23  > 4.0        Knee 17.7 - 1.00 -  Knee-Ankle 43.5 *35  > 40   Right Ulnar (ADM) Motor  Wrist 2.5  < 3.1 12.0  > 7.0        Bel elbow 6.8 - 10.8 -  Bel elbow-Wrist 25 58  > 50   Ab elbow 8.7 - 10.4 -  Ab elbow-Bel elbow 10 53 -    Sensory Sites    Neg Peak Lat Amplitude (O-P) Segment Distance Velocity Comment  Site (ms) Norm (V) Norm  (cm) (ms)   Right Median Sensory  Wrist-Dig II *NR  < 3.8 *NR  > 10 Wrist-Dig II 13  Right Radial Sensory  Forearm-Wrist 2.8  < 2.8 10  > 10 Forearm-Wrist 10    Right Superficial Fibular Sensory  14 cm-Ankle *NR  < 4.6 *NR  > 3 14 cm-Ankle 14    Right Sural Sensory  Calf-Lat mall *NR  < 4.6 *NR  > 3 Calf-Lat mall 14    Right Ulnar Sensory  Wrist-Dig V *NR  < 3.2 *NR  > 5 Wrist-Dig V 11     H-Reflex Results    M-Lat H Lat H Neg Amp H-M Lat  Site (ms) (ms) Norm (mV) (ms)  Right Tibial H-Reflex  Pop fossa 8.1 --  < 35.0 -- --   Electromyography   Side Muscle Ins.Act Fibs Fasc Recrt Amp Dur Poly Activation Comment  Right Tib ant Nml Nml Nml *2- *1+ *1+ *1+ Nml N/A  Right Gastroc MH Nml *2+ Nml *3- *1+ *1+ *1+ Nml N/A  Right Rectus fem Nml Nml Nml Nml Nml Nml Nml Nml N/A  Right Biceps fem SH *CRD Nml Nml *1- *1+ *1+ *1+ Nml N/A  Right Gluteus med Nml  Nml Nml Nml Nml Nml Nml Nml N/A  Right FDI Nml Nml Nml Nml Nml Nml Nml Nml N/A  Right EIP Nml Nml Nml Nml Nml Nml Nml Nml N/A  Right FPL Nml Nml Nml Nml Nml Nml Nml Nml N/A      Waveforms:  Motor             Sensory             H-Reflex

## 2022-07-07 ENCOUNTER — Other Ambulatory Visit: Payer: Self-pay

## 2022-07-07 DIAGNOSIS — M5417 Radiculopathy, lumbosacral region: Secondary | ICD-10-CM

## 2022-07-15 ENCOUNTER — Telehealth: Payer: Self-pay | Admitting: Neurology

## 2022-07-15 NOTE — Telephone Encounter (Signed)
Completed peer to peer for MRI lumbar spine. It was approved, # [id number]. Approval good until 01/11/23.  Kai Levins, MD [facility]

## 2022-08-04 ENCOUNTER — Other Ambulatory Visit: Payer: Self-pay | Admitting: Family Medicine

## 2022-08-04 NOTE — Telephone Encounter (Signed)
Patient states it should not have been sent to Dr Madilyn Fireman. He takes it for his kidneys.

## 2022-08-04 NOTE — Telephone Encounter (Signed)
Please call patient and just verify.  I do not mind taking over it but just want to make sure that it was not sent to me by accident since it looks like it was previously prescribed by another provider.  Also please clarify what indication he is taking it for is that his heart, kidneys, or sugar issues.

## 2022-08-17 ENCOUNTER — Ambulatory Visit: Payer: Medicare HMO

## 2022-08-17 ENCOUNTER — Telehealth: Payer: Self-pay | Admitting: Pulmonary Disease

## 2022-08-17 DIAGNOSIS — J479 Bronchiectasis, uncomplicated: Secondary | ICD-10-CM

## 2022-08-17 MED ORDER — AMOXICILLIN-POT CLAVULANATE 875-125 MG PO TABS: 1.0000 | ORAL_TABLET | Freq: Two times a day (BID) | ORAL | 0 refills | Status: DC

## 2022-08-17 NOTE — Telephone Encounter (Signed)
Spoke with the pt and notified of response per Dr Silas Flood  He verbalized understanding  Rx sent  I have scheduled rov  He will come for cxr tomorrow  Nothing further needed

## 2022-08-17 NOTE — Telephone Encounter (Signed)
Needs a CXR, ok with Augmentin 875-125 1 tab BID for 14 days. Needs follow up appt with me or NP.

## 2022-08-17 NOTE — Telephone Encounter (Signed)
Pt called requesting he speak to a nurse regarding pneumonia. Did not give further information. Please advise.

## 2022-08-17 NOTE — Telephone Encounter (Signed)
Spoke with the pt  He fears that he has PNA again  He says he has had it 16 x in the past and it presents the same- increased SOB, cough with green sputum, fever, aches  He states also having "left lung fullness"  He is requesting that we phone in meds and declined appt when offered  Has not taken covid test  Please advise, thanks!  Allergies  Allergen Reactions   Losartan Other (See Comments)    Lightheadedness, dizziness, syncope   Metoprolol Other (See Comments)    dizziness

## 2022-08-18 ENCOUNTER — Ambulatory Visit (INDEPENDENT_AMBULATORY_CARE_PROVIDER_SITE_OTHER): Payer: Medicare HMO

## 2022-08-18 DIAGNOSIS — J189 Pneumonia, unspecified organism: Secondary | ICD-10-CM | POA: Diagnosis not present

## 2022-08-18 DIAGNOSIS — J479 Bronchiectasis, uncomplicated: Secondary | ICD-10-CM

## 2022-08-22 NOTE — Progress Notes (Signed)
CXR unchanged which is good

## 2022-08-23 ENCOUNTER — Other Ambulatory Visit: Payer: Self-pay | Admitting: Family Medicine

## 2022-08-30 DIAGNOSIS — Z79899 Other long term (current) drug therapy: Secondary | ICD-10-CM | POA: Diagnosis not present

## 2022-08-30 DIAGNOSIS — L700 Acne vulgaris: Secondary | ICD-10-CM | POA: Diagnosis not present

## 2022-08-30 DIAGNOSIS — L2089 Other atopic dermatitis: Secondary | ICD-10-CM | POA: Diagnosis not present

## 2022-08-30 DIAGNOSIS — D1801 Hemangioma of skin and subcutaneous tissue: Secondary | ICD-10-CM | POA: Diagnosis not present

## 2022-08-30 DIAGNOSIS — L821 Other seborrheic keratosis: Secondary | ICD-10-CM | POA: Diagnosis not present

## 2022-08-30 DIAGNOSIS — Z85828 Personal history of other malignant neoplasm of skin: Secondary | ICD-10-CM | POA: Diagnosis not present

## 2022-08-30 DIAGNOSIS — L820 Inflamed seborrheic keratosis: Secondary | ICD-10-CM | POA: Diagnosis not present

## 2022-09-01 ENCOUNTER — Encounter: Payer: Self-pay | Admitting: Primary Care

## 2022-09-01 ENCOUNTER — Ambulatory Visit: Payer: Medicare HMO | Admitting: Primary Care

## 2022-09-01 VITALS — BP 132/84 | HR 77 | Ht 72.0 in | Wt 161.0 lb

## 2022-09-01 DIAGNOSIS — J479 Bronchiectasis, uncomplicated: Secondary | ICD-10-CM

## 2022-09-01 DIAGNOSIS — J471 Bronchiectasis with (acute) exacerbation: Secondary | ICD-10-CM

## 2022-09-01 MED ORDER — PREDNISONE 20 MG PO TABS: ORAL_TABLET | ORAL | 0 refills | Status: DC

## 2022-09-01 NOTE — Progress Notes (Signed)
$'@Patient'Q$  ID: Victor Price, male    DOB: May 02, 1951, 72 y.o.   MRN: [id number]  Chief Complaint  Patient presents with   Follow-up    Bronchiectasis  Breathing is the same .     Referring provider: Hali Marry, *  HPI: 72 year old male, never smoked.  Past medical history significant for hypertension, bronchiectasis, hyper thyroidism, thyroid nodule, degenerative disc disease, peripheral neuropathy, chronic kidney disease stage III, BPH, hyperlipidemia.  Patient of Dr. Silas Flood, seen for initial consult on 03/17/2022.  09/01/2022- interim hx  Patient presents today for follow-up/PNA.  Patient contacted our office on 08/17/2022 with symptoms of increased shortness of breath, productive cough with green sputum, fevers and aches.  Patient was ordered for chest x-ray and started on Augmentin 1 tab twice daily for 14 days.  Chest x-ray on 08/20/2022 showed no active cardiopulmonary disease, mild chronic scarring left lung base unchanged compared to 2019.  He has a chronic cough with purulent mucus He has fevers at night between 100-101 degrees that resolve on their own He has a couple days left of Augmentin He tells me he typically responds better when on prednisone as well He is not taking mucinex, states that he takes supplement called serrapeptase 40,00 spu daily which helps his cough a great deal  He has not tried flutter valve or hypertonic saline nebs His wife does chest physical therapy on him to loosen congestion  This is his second exacerbation in 12 months requiring antibiotics  Last sputum culture is from 2019    Allergies  Allergen Reactions   Losartan Other (See Comments)    Lightheadedness, dizziness, syncope   Metoprolol Other (See Comments)    dizziness    Immunization History  Administered Date(s) Administered   Fluad Quad(high Dose 65+) 04/09/2020, 05/11/2021, 06/03/2022   Influenza Split 05/17/2012   Influenza Whole 05/10/2005, 04/22/2011   Influenza,  High Dose Seasonal PF 03/28/2019   Influenza,inj,Quad PF,6+ Mos 04/02/2013, 05/28/2015   Influenza-Unspecified 04/22/2016, 04/30/2017, 05/16/2018   PFIZER(Purple Top)SARS-COV-2 Vaccination 10/01/2019, 10/22/2019   PNEUMOCOCCAL CONJUGATE-20 05/11/2021   Pneumococcal Polysaccharide-23 05/10/2005   Respiratory Syncytial Virus Vaccine,Recomb Aduvanted(Arexvy) 06/03/2022   Td 08/11/1998   Tdap 11/02/2011   Zoster Recombinat (Shingrix) 12/30/2019, 02/29/2020   Zoster, Live 11/19/2011    Past Medical History:  Diagnosis Date   BPH (benign prostatic hyperplasia)    Bronchiectasis with acute lower respiratory infection ([place]) 08/13/2019   Bronchitis    inhaler prn   Cancer ([place])    skin cancer?   ED (erectile dysfunction)    GERD (gastroesophageal reflux disease)    HLD (hyperlipidemia)    Hypertension    Pneumonia    gets recurrent PNA in left lung, recent dx 08/13/19 left lower lobe   Seasonal allergies     Tobacco History: Social History   Tobacco Use  Smoking Status Never  Smokeless Tobacco Never   Counseling given: Not Answered   Outpatient Medications Prior to Visit  Medication Sig Dispense Refill   albuterol (VENTOLIN HFA) 108 (90 Base) MCG/ACT inhaler Inhale 1-2 puffs into the lungs every 6 (six) hours as needed for wheezing or shortness of breath. 18 g 0   amoxicillin-clavulanate (AUGMENTIN) 875-125 MG tablet Take 1 tablet by mouth 2 (two) times daily. 28 tablet 0   atorvastatin (LIPITOR) 40 MG tablet TAKE 1 TABLET BY MOUTH EVERYDAY AT BEDTIME 90 tablet 3   bisoprolol-hydrochlorothiazide (ZIAC) 10-6.25 MG tablet Take 1 tablet by mouth daily. 1 tablet 0   CLARAVIS  40 MG capsule Take 40 mg by mouth 2 (two) times daily.     [facility] ALLERGY RELIEF-D 10-240 MG 24 hr tablet TAKE 1 TABLET BY MOUTH EVERY DAY 30 tablet 10   FARXIGA 10 MG TABS tablet Take 10 mg by mouth daily.     Lidocaine-Hydrocortisone Ace (ANAMANTLE HC) 3-0.5 % KIT Apply 1 dose rectally up to  Bid prn for spasm. 1  kit PRN   meclizine (ANTIVERT) 25 MG tablet Take 1 tablet (25 mg total) by mouth 3 (three) times daily as needed for dizziness. 30 tablet 0   Multiple Vitamin (MULTIVITAMIN) tablet Take 1 tablet by mouth daily.     mupirocin ointment (BACTROBAN) 2 % Apply topically 3 (three) times daily. 30 g prn   sildenafil (REVATIO) 20 MG tablet TAKE 2 TO 5 TABLETS BY  MOUTH DAILY AS NEEDED 40 tablet 5   tacrolimus (PROTOPIC) 0.1 % ointment Apply 1 application. topically 2 (two) times daily.     tamsulosin (FLOMAX) 0.4 MG CAPS capsule TAKE 1 CAPSULE (0.4 MG TOTAL) BY MOUTH IN THE MORNING AND AT BEDTIME 180 capsule 3   Testosterone 10 MG/ACT (2%) GEL APPLY 1 PUMP TO AN INNER THIGH AND 2 PUMPS TO OPPOSITE INNER THIGH IN AM. Harlene Salts. 120 g 2   traZODone (DESYREL) 50 MG tablet TAKE 1/2 TO 2 TABLETS BY MOUTH AT BEDTIME AS NEEDED SLEEP 180 tablet 3   triamcinolone cream (KENALOG) 0.1 % Apply topically 2 (two) times daily.     FLUAD QUADRIVALENT 0.5 ML injection  (Patient not taking: Reported on 09/01/2022)     umeclidinium-vilanterol (ANORO ELLIPTA) 62.5-25 MCG/ACT AEPB Inhale 1 puff into the lungs daily. (Patient not taking: Reported on 09/01/2022) 60 each 2   No facility-administered medications prior to visit.   Review of Systems  Review of Systems  Constitutional: Negative.   HENT:  Positive for congestion.   Respiratory:  Positive for cough. Negative for shortness of breath.   Cardiovascular: Negative.    Physical Exam  BP 132/84 (BP Location: Left Arm, Patient Position: Sitting, Cuff Size: Normal)   Pulse 77   Ht 6' (1.829 m)   Wt 161 lb (73 kg)   SpO2 96%   BMI 21.84 kg/m  Physical Exam Constitutional:      Appearance: Normal appearance.  HENT:     Head: Normocephalic and atraumatic.     Mouth/Throat:     Mouth: Mucous membranes are moist.     Pharynx: Oropharynx is clear.  Cardiovascular:     Rate and Rhythm: Normal rate and regular rhythm.  Pulmonary:     Effort: Pulmonary effort is  normal.     Breath sounds: Rhonchi present.  Skin:    General: Skin is warm and dry.  Neurological:     General: No focal deficit present.     Mental Status: He is alert and oriented to person, place, and time. Mental status is at baseline.  Psychiatric:        Mood and Affect: Mood normal.        Behavior: Behavior normal.        Thought Content: Thought content normal.        Judgment: Judgment normal.      Lab Results:  CBC    Component Value Date/Time   WBC 7.4 05/14/2021 0912   RBC 4.88 05/14/2021 0912   HGB 15.5 05/14/2021 0912   HCT 46.2 05/14/2021 0912   PLT 238 05/14/2021 0912   MCV 94.7 05/14/2021  0912   MCH 31.8 05/14/2021 0912   MCHC 33.5 05/14/2021 0912   RDW 13.1 05/14/2021 0912   LYMPHSABS 3,754 06/27/2018 0954   MONOABS 1,551 (H) 07/20/2016 1100   EOSABS 114 06/27/2018 0954   BASOSABS 42 06/27/2018 0954    BMET    Component Value Date/Time   NA 140 11/03/2021 0000   K 5.8 (H) 11/03/2021 0000   CL 101 11/03/2021 0000   CO2 30 11/03/2021 0000   GLUCOSE 73 11/03/2021 0000   BUN 20 11/03/2021 0000   CREATININE 1.90 (A) 02/15/2022 1406   CREATININE 1.68 (H) 11/03/2021 0000   CALCIUM 10.4 (H) 11/03/2021 0000   GFRNONAA 51 (L) 10/14/2020 0000   GFRAA 59 (L) 10/14/2020 0000    BNP No results found for: "BNP"  ProBNP No results found for: "PROBNP"  Imaging: DG Chest 2 View  Result Date: 08/20/2022 CLINICAL DATA:  Assess for pneumonia. EXAM: CHEST - 2 VIEW COMPARISON:  March 13, 2022, March 24, 2018 FINDINGS: The heart size and mediastinal contours are within normal limits. Mild chronic scar of the left lung base is identified unchanged compared to 2019. The visualized skeletal structures are unremarkable. IMPRESSION: No active cardiopulmonary disease. Electronically Signed   By: Abelardo Diesel M.D.   On: 08/20/2022 09:14     Assessment & Plan:   Bronchiectasis with (acute) exacerbation (HCC) - Recurrent infections. Chronic productive cough with  purulent mucus > 6 months requiring abx twice in the last year. He is experiencing fevers of 100-101 degrees at night. CXR on 08/20/2022 showed no active cardiopulmonary disease, mild chronic scarring left lung base unchanged compared to 2019.  Currently on Augmentin x 14 days without improvement. He responds to oral prednisone. Not currently using pulmonary clearance techniques. Recommending patient start hypertonic saline nebulizer's twice daily and flutter valve 2-3 times a day. Sending in prednisone '40mg'$  x 5 days. Checking respiratory/AFB sputum cultures. May want to repeat CT imaging and/or discuss possible need for bronchoscopy with Dr. Silas Flood. He also would likely benefit from a smart vest if there is no improvement with above plan. FU in 4-6 weeks.    Martyn Ehrich, NP 09/01/2022

## 2022-09-01 NOTE — Addendum Note (Signed)
Addended by: June Leap on: 09/01/2022 03:26 PM   Modules accepted: Orders

## 2022-09-01 NOTE — Patient Instructions (Addendum)
Chest x-ray on 08/20/2022 showed no active cardiopulmonary disease, mild chronic scarring left lung base unchanged compared to 2019.  Recommendations: Continue ABX until complete Start prednisone '40mg'$  x 5 days  Mucinex '1200mg'$  twice daily Use hypertonic saline nebulizer 1-2 times a day to loosen congestion Use flutter valve 2-3 times a day (esp after nebulizer) Please provide [name] with sputum culture if able  We may want to repeat CT chest imaging - i'll discuss with Dr. Silas Flood   Orders: Flutter valve  New nebulizer machine Respiratory sputum culture and AFB (ORDERED)  Rx: Prednisone  Sodium chloride nebulizer   Follow-up: 4-6 weeks with Dr. Silas Flood or Eustaquio Maize NP

## 2022-09-01 NOTE — Assessment & Plan Note (Signed)
-  Recurrent infections. Chronic productive cough with purulent mucus > 6 months requiring abx twice in the last year. He is experiencing fevers of 100-101 degrees at night. CXR on 08/20/2022 showed no active cardiopulmonary disease, mild chronic scarring left lung base unchanged compared to 2019.  Currently on Augmentin x 14 days without improvement. He responds to oral prednisone. Not currently using pulmonary clearance techniques. Recommending patient start hypertonic saline nebulizer's twice daily and flutter valve 2-3 times a day. Sending in prednisone '40mg'$  x 5 days. Checking respiratory/AFB sputum cultures. May want to repeat CT imaging and/or discuss possible need for bronchoscopy with Dr. Silas Flood. He also would likely benefit from a smart vest if there is no improvement with above plan. FU in 4-6 weeks.

## 2022-09-10 DIAGNOSIS — J479 Bronchiectasis, uncomplicated: Secondary | ICD-10-CM | POA: Diagnosis not present

## 2022-09-17 ENCOUNTER — Telehealth: Payer: Self-pay | Admitting: Primary Care

## 2022-09-17 MED ORDER — SODIUM CHLORIDE 3 % IN NEBU: INHALATION_SOLUTION | RESPIRATORY_TRACT | 3 refills | Status: DC | PRN

## 2022-09-17 NOTE — Telephone Encounter (Signed)
Spoke to pt and informed him I sent over the [facility]ic [facility] Pt verbalized understanding nothing further needed.

## 2022-09-24 ENCOUNTER — Other Ambulatory Visit: Payer: Self-pay | Admitting: Family Medicine

## 2022-09-24 DIAGNOSIS — E785 Hyperlipidemia, unspecified: Secondary | ICD-10-CM

## 2022-09-24 DIAGNOSIS — I10 Essential (primary) hypertension: Secondary | ICD-10-CM

## 2022-10-04 ENCOUNTER — Telehealth: Payer: Self-pay | Admitting: Primary Care

## 2022-10-04 MED ORDER — SODIUM CHLORIDE 3 % IN NEBU: INHALATION_SOLUTION | RESPIRATORY_TRACT | 3 refills | Status: DC | PRN

## 2022-10-04 MED ORDER — SODIUM CHLORIDE 3 % IN NEBU
INHALATION_SOLUTION | RESPIRATORY_TRACT | 3 refills | Status: DC | PRN
Start: 2022-10-04 — End: 2022-10-04

## 2022-10-04 NOTE — Telephone Encounter (Signed)
[facility]s call[facility] [phone]   BTW They do not have this med but another store may have it. Try [facility].

## 2022-10-04 NOTE — Telephone Encounter (Signed)
Can we please send prescription [facility]

## 2022-10-04 NOTE — Telephone Encounter (Signed)
Resent medication into [facility] to see if they have medication. Nothing further needed

## 2022-10-04 NOTE — Telephone Encounter (Signed)
Called and left pharmacy and voicemail to call office back in regards to nebulizer medication

## 2022-10-04 NOTE — Telephone Encounter (Signed)
[facility] and they state that the Hypertonic Sodium Chloride Neb is out of stock and is on back order.   Do you have any other recommendations on another neb in the mean time  Please advise [facility]

## 2022-10-04 NOTE — Telephone Encounter (Signed)
Hypertonic/Sodium Chloride Nebulizer. PT calling because Pharm says they are "having Trouble" getting this RX. I see we [facility] to [facility]:  [id number] [place] [place] [address]: [phone] Fax: [phone]  Can we please try a different way to a send. Pls call PT to advise PT [phone]

## 2022-10-05 ENCOUNTER — Ambulatory Visit (INDEPENDENT_AMBULATORY_CARE_PROVIDER_SITE_OTHER): Payer: Medicare HMO

## 2022-10-05 DIAGNOSIS — M5417 Radiculopathy, lumbosacral region: Secondary | ICD-10-CM | POA: Diagnosis not present

## 2022-10-05 DIAGNOSIS — M4727 Other spondylosis with radiculopathy, lumbosacral region: Secondary | ICD-10-CM | POA: Diagnosis not present

## 2022-10-05 DIAGNOSIS — M5116 Intervertebral disc disorders with radiculopathy, lumbar region: Secondary | ICD-10-CM | POA: Diagnosis not present

## 2022-10-05 DIAGNOSIS — M4807 Spinal stenosis, lumbosacral region: Secondary | ICD-10-CM | POA: Diagnosis not present

## 2022-10-08 ENCOUNTER — Ambulatory Visit
Admission: RE | Admit: 2022-10-08 | Discharge: 2022-10-08 | Disposition: A | Discharge status: Home / Self Care | Payer: Medicare HMO | Source: Ambulatory Visit | Attending: Family Medicine | Admitting: Family Medicine

## 2022-10-08 VITALS — BP 153/89 | HR 84 | Temp 98.0°F | Resp 20 | Ht 72.0 in | Wt 160.0 lb

## 2022-10-08 DIAGNOSIS — J471 Bronchiectasis with (acute) exacerbation: Secondary | ICD-10-CM

## 2022-10-08 MED ORDER — PREDNISONE 20 MG PO TABS: ORAL_TABLET | ORAL | 0 refills | Status: DC

## 2022-10-08 MED ORDER — AMOXICILLIN-POT CLAVULANATE 875-125 MG PO TABS: 1.0000 | ORAL_TABLET | Freq: Two times a day (BID) | ORAL | 0 refills | Status: DC

## 2022-10-08 MED ORDER — METHYLPREDNISOLONE SODIUM SUCC 125 MG IJ SOLR
80.0000 mg | Freq: Once | INTRAMUSCULAR | Status: AC
  Administered 2022-10-08: 80 mg via INTRAMUSCULAR

## 2022-10-08 NOTE — ED Provider Notes (Signed)
[facility]    CSN: [id number] Arrival date & time: 10/08/22  1621      History   Chief Complaint Chief Complaint  Patient presents with   Wheezing   Cough    HPI Victor Price is a 72 y.o. male.   Patient complains of worsening cough productive of thick green sputum during the past several days. He has bronchiectasis with chronic cough and history of pneumonia, followed by pulmonology.  He has had a low grade fever and increased nasal congestion also, but denies increased shortness of breath or pleuritic pain. Review of records reveal chest x-ray done 08/17/22 showing no active cardiopulmonary desease.     Past Medical History:  Diagnosis Date   BPH (benign prostatic hyperplasia)    Bronchiectasis with acute lower respiratory infection ([place]) 08/13/2019   Bronchitis    inhaler prn   Cancer (HCC)    skin cancer?   ED (erectile dysfunction)    GERD (gastroesophageal reflux disease)    HLD (hyperlipidemia)    Hypertension    Pneumonia    gets recurrent PNA in left lung, recent dx 08/13/19 left lower lobe   Seasonal allergies     Patient Active Problem List   Diagnosis Date Noted   Left inguinal hernia 01/27/2022   Carpal tunnel syndrome, left upper limb 12/04/2019   H/O total hip arthroplasty, left 10/19/2019   DDD (degenerative disc disease), lumbar 06/05/2019   Subclinical hyperthyroidism 10/31/2017   Hypogonadism in male 10/31/2017   CKD (chronic kidney disease) stage 3, GFR 30-59 ml/min (HCC) 02/08/2017   Bronchiectasis with (acute) exacerbation ([place]) 07/20/2016   Radiculitis of right cervical region 08/28/2014   DDD (degenerative disc disease), cervical 08/28/2014   Insomnia 04/30/2014   Peripheral neuropathy 04/02/2013   Herniated disc 01/29/2013   BPH (benign prostatic hyperplasia) 12/14/2011   ERECTILE DYSFUNCTION, ORGANIC 07/23/2010   THYROID NODULE, LEFT 05/23/2007   Hyperlipidemia 05/17/2006   HYPERTENSION, BENIGN SYSTEMIC 05/17/2006     Past Surgical History:  Procedure Laterality Date   BACK SURGERY  1980s   [facility]   COLONOSCOPY  2004, 2012   several - polyps   MULTIPLE TOOTH EXTRACTIONS     TOTAL HIP ARTHROPLASTY Left 08/24/2019   Procedure: LEFT TOTAL HIP ARTHROPLASTY-DIRECT ANTERIOR;  Surgeon: Marybelle Killings, MD;  Location: [place];  Service: Orthopedics;  Laterality: Left;       Home Medications    Prior to Admission medications   Medication Sig Start Date End Date Taking? Authorizing Provider  predniSONE (DELTASONE) 20 MG tablet Take one tab by mouth twice daily for 4 days, then one daily for 3 days. Take with food. 10/08/22  Yes Kandra Nicolas, MD  umeclidinium-vilanterol (ANORO ELLIPTA) 62.5-25 MCG/ACT AEPB Inhale 1 puff into the lungs daily. 03/17/22  Yes Hunsucker, Bonna Gains, MD  albuterol (VENTOLIN HFA) 108 (90 Base) MCG/ACT inhaler Inhale 1-2 puffs into the lungs every 6 (six) hours as needed for wheezing or shortness of breath. 03/13/22   Raylene Everts, MD  amoxicillin-clavulanate (AUGMENTIN) 875-125 MG tablet Take 1 tablet by mouth 2 (two) times daily. Take with food 10/08/22   Kandra Nicolas, MD  atorvastatin (LIPITOR) 40 MG tablet TAKE 1 TABLET BY MOUTH EVERYDAY AT BEDTIME 02/26/22   Hali Marry, MD  bisoprolol-hydrochlorothiazide [facility]) 10-6.25 MG tablet Take 1 tablet by mouth daily. NO REFILL. NEEDS A F/U APPT FOR BP CHECK 09/24/22   Hali Marry, MD  CLARAVIS 40 MG capsule Take 40  mg by mouth 2 (two) times daily. 02/26/22   [provider]  [employer] ALLERGY RELIEF-D 10-240 MG 24 hr tablet TAKE 1 TABLET BY MOUTH EVERY DAY 08/23/22   Hali Marry, MD  FARXIGA 10 MG TABS tablet Take 10 mg by mouth daily. 03/01/22   [provider]  FLUAD QUADRIVALENT 0.5 ML injection  06/03/22   [provider]  Lidocaine-Hydrocortisone Ace (ANAMANTLE HC) 3-0.5 % KIT Apply 1 dose rectally up to  Bid prn for spasm. 10/11/19   Hali Marry, MD  meclizine (ANTIVERT) 25 MG  tablet Take 1 tablet (25 mg total) by mouth 3 (three) times daily as needed for dizziness. 02/18/22   Hali Marry, MD  Multiple Vitamin (MULTIVITAMIN) tablet Take 1 tablet by mouth daily.    [provider]  mupirocin ointment (BACTROBAN) 2 % Apply topically 3 (three) times daily. 09/11/20   Hali Marry, MD  sildenafil (REVATIO) 20 MG tablet TAKE 2 TO 5 TABLETS BY  MOUTH DAILY AS NEEDED 01/06/22   Hali Marry, MD  sodium chloride HYPERTONIC 3 % nebulizer solution Take by nebulization as needed for other. 1-2 times a day to loosen congestion 10/04/22   Martyn Ehrich, NP  tacrolimus (PROTOPIC) 0.1 % ointment Apply 1 application. topically 2 (two) times daily. 11/17/21   [provider]  tamsulosin (FLOMAX) 0.4 MG CAPS capsule TAKE 1 CAPSULE (0.4 MG TOTAL) BY MOUTH IN THE MORNING AND AT BEDTIME 12/28/21   Hali Marry, MD  Testosterone 10 MG/ACT (2%) GEL APPLY 1 PUMP TO AN INNER THIGH AND 2 PUMPS TO OPPOSITE INNER THIGH IN AM. Harlene Salts. 11/16/21   Hali Marry, MD  traZODone (DESYREL) 50 MG tablet TAKE 1/2 TO 2 TABLETS BY MOUTH AT BEDTIME AS NEEDED SLEEP 02/26/22   Hali Marry, MD  triamcinolone cream (KENALOG) 0.1 % Apply topically 2 (two) times daily. 10/19/21   [provider]    Family History Family History  Problem Relation Age of Onset   Dementia Mother    Healthy Father     Social History Social History   Tobacco Use   Smoking status: Never   Smokeless tobacco: Never  Vaping Use   Vaping Use: Never used  Substance Use Topics   Alcohol use: No   Drug use: No     Allergies   Losartan and Metoprolol   Review of Systems Review of Systems No sore throat + cough No pleuritic pain No wheezing + nasal congestion + post-nasal drainage No sinus pain/pressure No itchy/red eyes No earache No hemoptysis No SOB + low grade fever  No nausea No vomiting No abdominal pain No diarrhea No urinary  symptoms No skin rash No fatigue No myalgias No headache   Physical Exam Triage Vital Signs ED Triage Vitals  Enc Vitals Group     BP 10/08/22 1643 (!) 153/89     Pulse Rate 10/08/22 1643 84     Resp 10/08/22 1643 20     Temp 10/08/22 1643 98 F (36.7 C)     Temp Source 10/08/22 1643 Oral     SpO2 10/08/22 1647 97 %     Weight 10/08/22 1644 160 lb (72.6 kg)     Height 10/08/22 1644 6' (1.829 m)     Head Circumference --      Peak Flow --      Pain Score 10/08/22 1641 0     Pain Loc --  Pain Edu? --      Excl. in [place]? --    No data found.  Updated Vital Signs BP (!) 153/89 (BP Location: Right Arm)   Pulse 84   Temp 98 F (36.7 C) (Oral)   Resp 20   Ht 6' (1.829 m)   Wt 72.6 kg   SpO2 97%   BMI 21.70 kg/m   Visual Acuity Right Eye Distance:   Left Eye Distance:   Bilateral Distance:    Right Eye Near:   Left Eye Near:    Bilateral Near:     Physical Exam Nursing notes and Vital Signs reviewed. Appearance:  Patient appears stated age, and in no acute distress Eyes:  Pupils are equal, round, and reactive to light and accomodation.  Extraocular movement is intact.  Conjunctivae are not inflamed  Ears:  Canals normal.  Tympanic membranes normal.  Nose:   Normal turbinates.  No sinus tenderness.   Pharynx:  Normal Neck:  Supple.  No adenopathy.  Lungs:  Diffuse bilateral rhonchi.  Breath sounds are equal. Heart:  Regular rate and rhythm without murmurs, rubs, or gallops.  Abdomen:  Nontender without masses or hepatosplenomegaly.  Bowel sounds are present.  No CVA or flank tenderness.  Extremities:  No edema.  Skin:  No rash present.   UC Treatments / Results  Labs (all labs ordered are listed, but only abnormal results are displayed) Labs Reviewed - No data to display  EKG   Radiology No results found.  Procedures Procedures (including critical care time)  Medications Ordered in UC Medications  methylPREDNISolone sodium succinate  (SOLU-MEDROL) 125 mg/2 mL injection 80 mg (has no administration in time range)    Initial Impression / Assessment and Plan / UC Course  I have reviewed the triage vital signs and the nursing notes.  Pertinent labs & imaging results that were available during my care of the patient were reviewed by me and considered in my medical decision making (see chart for details).    Begin Augmentin 875 BID Administered Solumedrol '80mg'$  IM; then begin prednisone burst/taper. Followup with Family Doctor if not improved in one week.   Final Clinical Impressions(s) / UC Diagnoses   Final diagnoses:  Bronchiectasis with (acute) exacerbation [facility])     Discharge Instructions      Begin prednisone Saturday 10/09/22. Take plain guaifenesin ('1200mg'$  extended release tabs such as Mucinex) twice daily, with plenty of water, for cough and congestion.   Get adequate rest.   Continue inhalers as prescribed.   If symptoms become significantly worse during the night or over the weekend, proceed to the local emergency room.     ED Prescriptions     Medication Sig Dispense Auth. Provider   amoxicillin-clavulanate (AUGMENTIN) 875-125 MG tablet Take 1 tablet by mouth 2 (two) times daily. Take with food 28 tablet Kandra Nicolas, MD   predniSONE (DELTASONE) 20 MG tablet Take one tab by mouth twice daily for 4 days, then one daily for 3 days. Take with food. 11 tablet Kandra Nicolas, MD         Kandra Nicolas, MD 10/10/22 [phone]

## 2022-10-08 NOTE — ED Triage Notes (Signed)
Pt presents to Urgent Care with c/o acute on chronic cough w/ thick greenish sputum and nasal congestion "over the last few days." Reports low-grade fever as well. Has not done COVID test.

## 2022-10-08 NOTE — Discharge Instructions (Signed)
Begin prednisone Saturday 10/09/22. Take plain guaifenesin ('1200mg'$  extended release tabs such as Mucinex) twice daily, with plenty of water, for cough and congestion.   Get adequate rest.   Continue inhalers as prescribed.   If symptoms become significantly worse during the night or over the weekend, proceed to the local emergency room.

## 2022-10-09 ENCOUNTER — Telehealth: Payer: Self-pay | Admitting: Emergency Medicine

## 2022-10-09 NOTE — Telephone Encounter (Signed)
[place].  Advised if doing well to disregard the call.  Any questions or concerns, feel free to contact the office.

## 2022-10-18 ENCOUNTER — Telehealth: Payer: Self-pay | Admitting: Primary Care

## 2022-10-18 NOTE — Telephone Encounter (Signed)
sodium chloride HYPERTONIC 3 % nebulizer solution [id number] Pharmacy needs the correct quantity also the needs the clinical records sent as well

## 2022-10-19 ENCOUNTER — Other Ambulatory Visit: Payer: Self-pay | Admitting: Family Medicine

## 2022-10-19 DIAGNOSIS — E785 Hyperlipidemia, unspecified: Secondary | ICD-10-CM

## 2022-10-19 DIAGNOSIS — I10 Essential (primary) hypertension: Secondary | ICD-10-CM

## 2022-10-19 MED ORDER — BISOPROLOL-HYDROCHLOROTHIAZIDE 10-6.25 MG PO TABS
1.0000 | ORAL_TABLET | Freq: Every day | ORAL | 0 refills | Status: DC
Start: 2022-10-19 — End: 2022-11-01

## 2022-10-19 MED ORDER — SODIUM CHLORIDE 3 % IN NEBU: INHALATION_SOLUTION | RESPIRATORY_TRACT | 5 refills | Status: DC

## 2022-10-19 NOTE — Telephone Encounter (Signed)
Updated RX has been sent. Will fax copy of last OV and insurance cards to [facility] as requested.

## 2022-10-19 NOTE — Telephone Encounter (Signed)
Please call pt he is due for OV to check bp and labs. thanks

## 2022-10-19 NOTE — Telephone Encounter (Signed)
Called spoke with patient he is scheduled for 11-04-22

## 2022-10-19 NOTE — Telephone Encounter (Signed)
Meds ordered this encounter  Medications   bisoprolol-hydrochlorothiazide (ZIAC) 10-6.25 MG tablet    Sig: Take 1 tablet by mouth daily.    Dispense:  90 tablet    Refill:  0

## 2022-10-21 ENCOUNTER — Other Ambulatory Visit: Payer: Self-pay

## 2022-10-21 MED ORDER — SODIUM CHLORIDE 3 % IN NEBU: INHALATION_SOLUTION | RESPIRATORY_TRACT | 5 refills | Status: DC

## 2022-10-21 NOTE — Telephone Encounter (Signed)
Received fax from [facility].  Need a new rx for Sodium chloride hypertonic 3% nebulizer solution.  Send in refill rx, disp #240 ml total. Requesting rx to state: Disp: 240 mL (Comes in 60 count boxes of 4 mL vials).

## 2022-10-23 ENCOUNTER — Other Ambulatory Visit: Payer: Self-pay | Admitting: Family Medicine

## 2022-10-31 ENCOUNTER — Other Ambulatory Visit: Payer: Self-pay | Admitting: Family Medicine

## 2022-10-31 DIAGNOSIS — R351 Nocturia: Secondary | ICD-10-CM

## 2022-10-31 DIAGNOSIS — N400 Enlarged prostate without lower urinary tract symptoms: Secondary | ICD-10-CM

## 2022-10-31 DIAGNOSIS — I10 Essential (primary) hypertension: Secondary | ICD-10-CM

## 2022-10-31 DIAGNOSIS — E785 Hyperlipidemia, unspecified: Secondary | ICD-10-CM

## 2022-11-04 ENCOUNTER — Encounter: Payer: Self-pay | Admitting: Family Medicine

## 2022-11-04 ENCOUNTER — Ambulatory Visit (INDEPENDENT_AMBULATORY_CARE_PROVIDER_SITE_OTHER): Payer: Medicare HMO | Admitting: Family Medicine

## 2022-11-04 VITALS — BP 121/72 | HR 82 | Ht 72.0 in | Wt 167.0 lb

## 2022-11-04 DIAGNOSIS — R69 Illness, unspecified: Secondary | ICD-10-CM | POA: Diagnosis not present

## 2022-11-04 DIAGNOSIS — J470 Bronchiectasis with acute lower respiratory infection: Secondary | ICD-10-CM | POA: Diagnosis not present

## 2022-11-04 DIAGNOSIS — I10 Essential (primary) hypertension: Secondary | ICD-10-CM

## 2022-11-04 DIAGNOSIS — L700 Acne vulgaris: Secondary | ICD-10-CM | POA: Diagnosis not present

## 2022-11-04 DIAGNOSIS — H938X2 Other specified disorders of left ear: Secondary | ICD-10-CM

## 2022-11-04 DIAGNOSIS — N1831 Chronic kidney disease, stage 3a: Secondary | ICD-10-CM

## 2022-11-04 DIAGNOSIS — F5101 Primary insomnia: Secondary | ICD-10-CM

## 2022-11-04 DIAGNOSIS — E785 Hyperlipidemia, unspecified: Secondary | ICD-10-CM

## 2022-11-04 DIAGNOSIS — E0590 Thyrotoxicosis, unspecified without thyrotoxic crisis or storm: Secondary | ICD-10-CM | POA: Diagnosis not present

## 2022-11-04 DIAGNOSIS — N400 Enlarged prostate without lower urinary tract symptoms: Secondary | ICD-10-CM | POA: Diagnosis not present

## 2022-11-04 DIAGNOSIS — E291 Testicular hypofunction: Secondary | ICD-10-CM

## 2022-11-04 DIAGNOSIS — K649 Unspecified hemorrhoids: Secondary | ICD-10-CM | POA: Insufficient documentation

## 2022-11-04 MED ORDER — ANORO ELLIPTA 62.5-25 MCG/ACT IN AEPB: 1.0000 | INHALATION_SPRAY | Freq: Every day | RESPIRATORY_TRACT | 6 refills | Status: DC

## 2022-11-04 MED ORDER — TESTOSTERONE 10 MG/ACT (2%) TD GEL: TRANSDERMAL | 2 refills | Status: DC

## 2022-11-04 MED ORDER — LIDOCAINE-HYDROCORTISONE ACE 3-0.5 % RE KIT: PACK | RECTAL | 99 refills | Status: DC

## 2022-11-04 NOTE — Assessment & Plan Note (Signed)
Well controlled. Continue current regimen. Follow up in  6 mo  

## 2022-11-04 NOTE — Assessment & Plan Note (Signed)
Now on Accutane.  Avoid doxys

## 2022-11-04 NOTE — Assessment & Plan Note (Signed)
On ACE inhibitor and [place].

## 2022-11-04 NOTE — Assessment & Plan Note (Addendum)
Continue trazodone.  Follow-up 6 months.

## 2022-11-04 NOTE — Progress Notes (Signed)
Established Patient Office Visit  Subjective   Patient ID: Victor Price, male    DOB: 05-17-1951  Age: 72 y.o. MRN: [id number]  Chief Complaint  Patient presents with   Hypertension    HPI   Hypertension- Pt denies chest pain, SOB, dizziness, or heart palpitations.  Taking meds as directed w/o problems.  Denies medication side effects.    F/U CKD 3   Recently seen at the urgent care at the beginning of the month for an acute flare with his bronchiectasis.  He is feeling better overall and still has some chronic congestion in his chest.  He said he is pretty much had that congestion since he had COVID back over the summer.  He does feel like his left ear is full or stopped up.  He does follow with pulmonology.  They had wanted to get some sputum cultures but just has not had a chance to get them all the way back to [facility] he is wondering if he could do it here locally.   Hyperlipidemia - tolerating stating well with no myalgias or significant side effects.  Lab Results  Component Value Date   CHOL 141 05/14/2021   HDL 73 05/14/2021   LDLCALC 54 05/14/2021   TRIG 67 05/14/2021   CHOLHDL 1.9 05/14/2021    So wanted let me know that he is working with a dermatologist for his cystic acne and is currently on Accutane twice a week.     ROS    Objective:     BP 121/72   Pulse 82   Ht 6' (1.829 m)   Wt 167 lb (75.8 kg)   SpO2 97%   BMI 22.65 kg/m    Physical Exam Constitutional:      Appearance: He is well-developed.  HENT:     Head: Normocephalic and atraumatic.     Right Ear: Tympanic membrane, ear canal and external ear normal.     Left Ear: Tympanic membrane, ear canal and external ear normal.     Nose: Nose normal.  Eyes:     Conjunctiva/sclera: Conjunctivae normal.     Pupils: Pupils are equal, round, and reactive to light.  Neck:     Thyroid: No thyromegaly.  Cardiovascular:     Rate and Rhythm: Normal rate and regular rhythm.     Heart sounds:  Normal heart sounds.  Pulmonary:     Effort: Pulmonary effort is normal.     Comments: Diffuse rhonchi worse on the left compared to the right. Musculoskeletal:     Cervical back: Neck supple.  Lymphadenopathy:     Cervical: No cervical adenopathy.  Skin:    General: Skin is warm and dry.  Neurological:     Mental Status: He is alert and oriented to person, place, and time.  Psychiatric:        Behavior: Behavior normal.      No results found for any visits on 11/04/22.    The 10-year ASCVD risk score (Arnett DK, et al., 2019) is: 13.6%    Assessment & Plan:   Problem List Items Addressed This Visit       Cardiovascular and Mediastinum   HYPERTENSION, BENIGN SYSTEMIC - Primary    Well controlled. Continue current regimen. Follow up in  6 mo       Relevant Orders   PSA   Lipid panel   COMPLETE METABOLIC PANEL WITH GFR   CBC   Urine Microalbumin w/creat. ratio   Hemorrhoid  Relevant Medications   Lidocaine-Hydrocortisone Ace (ANAMANTLE HC) 3-0.5 % KIT     Respiratory   Bronchiectasis (HCC)   Relevant Medications   umeclidinium-vilanterol (ANORO ELLIPTA) 62.5-25 MCG/ACT AEPB     Endocrine   Subclinical hyperthyroidism   Relevant Orders   TSH   Hypogonadism in male   Relevant Medications   Testosterone 10 MG/ACT (2%) GEL   Other Relevant Orders   PSA   CBC   Testosterone Total,Free,Bio, Males     Musculoskeletal and Integument   Cystic acne    Now on Accutane.  Avoid doxys        Genitourinary   CKD (chronic kidney disease) stage 3, GFR 30-59 ml/min (HCC)    On ACE inhibitor and [place].      Relevant Orders   PSA   Lipid panel   COMPLETE METABOLIC PANEL WITH GFR   CBC   Urine Microalbumin w/creat. ratio   Phosphorus     Other   Insomnia    Continue trazodone.  Follow-up 6 months.      Hyperlipidemia    Continue atorvastatin 40 mg.      Relevant Orders   PSA   Lipid panel   COMPLETE METABOLIC PANEL WITH GFR   CBC   Urine  Microalbumin w/creat. ratio   Other Visit Diagnoses     Ear fullness, left          Left ear fullness-exam is normal today and reassuring.  If it does not clear in the next week or 2 then recommend ENT referral.  May just be a eustachian tube dysfunction from recent respiratory illness.  The respiratory cultures were ordered in January through [facility] so I did reprint the orders and he can bring the samples to the [facility] downstairs he already has the tubes for collection.  Return in about 6 months (around 05/07/2023) for Hypertension.    Beatrice Lecher, MD

## 2022-11-04 NOTE — Assessment & Plan Note (Signed)
Continue atorvastatin 40mg 

## 2022-11-05 LAB — CBC
HCT: 47.8 % (ref 38.5–50.0)
Hemoglobin: 16 g/dL (ref 13.2–17.1)
MCH: 30.5 pg (ref 27.0–33.0)
MCHC: 33.5 g/dL (ref 32.0–36.0)
MCV: 91 fL (ref 80.0–100.0)
MPV: 9.1 fL (ref 7.5–12.5)
Platelets: 313 10*3/uL (ref 140–400)
RBC: 5.25 10*6/uL (ref 4.20–5.80)
RDW: 13.2 % (ref 11.0–15.0)
WBC: 7.7 10*3/uL (ref 3.8–10.8)

## 2022-11-05 LAB — COMPLETE METABOLIC PANEL WITH GFR
AG Ratio: 1.3 (calc) (ref 1.0–2.5)
ALT: 14 U/L (ref 9–46)
AST: 17 U/L (ref 10–35)
Albumin: 3.9 g/dL (ref 3.6–5.1)
Alkaline phosphatase (APISO): 99 U/L (ref 35–144)
BUN/Creatinine Ratio: 9 (calc) (ref 6–22)
BUN: 17 mg/dL (ref 7–25)
CO2: 27 mmol/L (ref 20–32)
Calcium: 9.7 mg/dL (ref 8.6–10.3)
Chloride: 103 mmol/L (ref 98–110)
Creat: 1.83 mg/dL — ABNORMAL HIGH (ref 0.70–1.28)
Globulin: 3.1 g/dL (calc) (ref 1.9–3.7)
Glucose, Bld: 85 mg/dL (ref 65–99)
Potassium: 4.8 mmol/L (ref 3.5–5.3)
Sodium: 141 mmol/L (ref 135–146)
Total Bilirubin: 0.6 mg/dL (ref 0.2–1.2)
Total Protein: 7 g/dL (ref 6.1–8.1)
eGFR: 39 mL/min/{1.73_m2} — ABNORMAL LOW (ref >=60)

## 2022-11-05 LAB — TESTOSTERONE TOTAL,FREE,BIO, MALES
Albumin: 3.9 g/dL (ref 3.6–5.1)
Sex Hormone Binding: 41 nmol/L (ref 22–77)
Testosterone, Bioavailable: 66.1 ng/dL (ref 15.0–150.0)
Testosterone, Free: 36.8 pg/mL (ref 6.0–73.0)
Testosterone: 334 ng/dL (ref 250–827)

## 2022-11-05 LAB — LIPID PANEL
Cholesterol: 151 mg/dL (ref <200)
HDL: 64 mg/dL (ref >=40)
LDL Cholesterol (Calc): 66 mg/dL (calc)
Non-HDL Cholesterol (Calc): 87 mg/dL (calc) (ref <130)
Total CHOL/HDL Ratio: 2.4 (calc) (ref <5.0)
Triglycerides: 133 mg/dL (ref <150)

## 2022-11-05 LAB — PHOSPHORUS: Phosphorus: 3.8 mg/dL (ref 2.1–4.3)

## 2022-11-05 LAB — PSA: PSA: 1.21 ng/mL (ref <=4.00)

## 2022-11-05 LAB — MICROALBUMIN / CREATININE URINE RATIO
Creatinine, Urine: 75 mg/dL (ref 20–320)
Microalb Creat Ratio: 176 mg/g creat — ABNORMAL HIGH (ref <30)
Microalb, Ur: 13.2 mg/dL

## 2022-11-05 LAB — TSH: TSH: 0.26 mIU/L — ABNORMAL LOW (ref 0.40–4.50)

## 2022-11-08 NOTE — Progress Notes (Signed)
Hi Victor Price, kidney function stable at 1.8.  Your GFR is around 40.  I do want a keep a close eye on this and plan to recheck again in 3 months.  Your electrolytes and liver function look great.  You do have some protein in the urine but not as much as you did a year ago so that is reassuring.  TSH is a little under 1 and it is a little lower than it was a year ago so also want a keep a close eye on this.  Lets plan to recheck that in 3 months as well.  PSA test is normal.  Cholesterol looks great.  Blood count is normal.  Phosphorus is normal.  Testosterone is okay.

## 2022-11-14 ENCOUNTER — Other Ambulatory Visit: Payer: Self-pay | Admitting: Family Medicine

## 2022-12-16 ENCOUNTER — Other Ambulatory Visit: Payer: Self-pay | Admitting: Family Medicine

## 2022-12-16 DIAGNOSIS — N529 Male erectile dysfunction, unspecified: Secondary | ICD-10-CM

## 2022-12-27 ENCOUNTER — Ambulatory Visit (INDEPENDENT_AMBULATORY_CARE_PROVIDER_SITE_OTHER): Payer: Medicare HMO | Admitting: Family Medicine

## 2022-12-27 DIAGNOSIS — Z0000 Encounter for general adult medical examination without abnormal findings: Secondary | ICD-10-CM

## 2022-12-27 NOTE — Patient Instructions (Signed)
MEDICARE ANNUAL WELLNESS VISIT Health Maintenance Summary and Written Plan of Care  [name] ,  Thank you for allowing me to perform your Medicare Annual Wellness Visit and for your ongoing commitment to your health.   Health Maintenance & Immunization History Health Maintenance  Topic Date Due   DTaP/Tdap/Td (3 - Td or Tdap) 11/01/2021   COVID-19 Vaccine (3 - Pfizer risk series) 01/12/2023 (Originally 11/19/2019)   Hepatitis C Screening  12/27/2023 (Originally 10/13/1968)   INFLUENZA VACCINE  03/10/2023   Medicare Annual Wellness (AWV)  12/27/2023   COLONOSCOPY (Pts 45-74yrs Insurance coverage will need to be confirmed)  12/31/2031   Pneumonia Vaccine 3+ Years old  Completed   Zoster Vaccines- Shingrix  Completed   HPV VACCINES  Aged Out   Immunization History  Administered Date(s) Administered   Fluad Quad(high Dose 65+) 04/09/2020, 05/11/2021, 06/03/2022   Influenza Split 05/17/2012   Influenza Whole 05/10/2005, 04/22/2011   Influenza, High Dose Seasonal PF 03/28/2019, 08/22/2022   Influenza,inj,Quad PF,6+ Mos 04/02/2013, 05/28/2015   Influenza-Unspecified 04/22/2016, 04/30/2017, 05/16/2018   PFIZER(Purple Top)SARS-COV-2 Vaccination 10/01/2019, 10/22/2019   PNEUMOCOCCAL CONJUGATE-20 05/11/2021   Pneumococcal Polysaccharide-23 05/10/2005   Respiratory Syncytial Virus Vaccine,Recomb Aduvanted(Arexvy) 06/03/2022   Td 08/11/1998   Tdap 11/02/2011   Zoster Recombinat (Shingrix) 12/30/2019, 02/29/2020   Zoster, Live 11/19/2011    These are the patient goals that we discussed:  Goals Addressed               This Visit's Progress     Patient Stated (pt-stated)        Patient stated that he would like to continue to maintain his current healthy lifestyle.         This is a list of Health Maintenance Items that are overdue or due now: Health Maintenance Due  Topic Date Due   DTaP/Tdap/Td (3 - Td or Tdap) 11/01/2021     Orders/Referrals Placed Today: No orders of  the defined types were placed in this encounter.  (Contact our referral department at [phone] if you have not spoken with someone about your referral appointment within the next 5 days)    Follow-up Plan Follow-up with Agapito Games, MD as planned Schedule tetanus shot at the pharmacy. Medicare wellness visit in one year.  Patient will access AVS on my chart.      Health Maintenance, Male Adopting a healthy lifestyle and getting preventive care are important in promoting health and wellness. Ask your health care provider about: The right schedule for you to have regular tests and exams. Things you can do on your own to prevent diseases and keep yourself healthy. What should I know about diet, weight, and exercise? Eat a healthy diet  Eat a diet that includes plenty of vegetables, fruits, low-fat dairy products, and lean protein. Do not eat a lot of foods that are high in solid fats, added sugars, or sodium. Maintain a healthy weight Body mass index (BMI) is a measurement that can be used to identify possible weight problems. It estimates body fat based on height and weight. Your health care provider can help determine your BMI and help you achieve or maintain a healthy weight. Get regular exercise Get regular exercise. This is one of the most important things you can do for your health. Most adults should: Exercise for at least 150 minutes each week. The exercise should increase your heart rate and make you sweat (moderate-intensity exercise). Do strengthening exercises at least twice a week. This is in  addition to the moderate-intensity exercise. Spend less time sitting. Even light physical activity can be beneficial. Watch cholesterol and blood lipids Have your blood tested for lipids and cholesterol at 70 years of age, then have this test every 5 years. You may need to have your cholesterol levels checked more often if: Your lipid or cholesterol levels are high. You  are older than 70 years of age. You are at high risk for heart disease. What should I know about cancer screening? Many types of cancers can be detected early and may often be prevented. Depending on your health history and family history, you may need to have cancer screening at various ages. This may include screening for: Colorectal cancer. Prostate cancer. Skin cancer. Lung cancer. What should I know about heart disease, diabetes, and high blood pressure? Blood pressure and heart disease High blood pressure causes heart disease and increases the risk of stroke. This is more likely to develop in people who have high blood pressure readings or are overweight. Talk with your health care provider about your target blood pressure readings. Have your blood pressure checked: Every 3-5 years if you are 22-25 years of age. Every year if you are 80 years old or older. If you are between the ages of 15 and 80 and are a current or former smoker, ask your health care provider if you should have a one-time screening for abdominal aortic aneurysm (AAA). Diabetes Have regular diabetes screenings. This checks your fasting blood sugar level. Have the screening done: Once every three years after age 33 if you are at a normal weight and have a low risk for diabetes. More often and at a younger age if you are overweight or have a high risk for diabetes. What should I know about preventing infection? Hepatitis B If you have a higher risk for hepatitis B, you should be screened for this virus. Talk with your health care provider to find out if you are at risk for hepatitis B infection. Hepatitis C Blood testing is recommended for: Everyone born from [birth year] through 1963. Anyone with known risk factors for hepatitis C. Sexually transmitted infections (STIs) You should be screened each year for STIs, including gonorrhea and chlamydia, if: You are sexually active and are younger than 70 years of age. You are  older than 70 years of age and your health care provider tells you that you are at risk for this type of infection. Your sexual activity has changed since you were last screened, and you are at increased risk for chlamydia or gonorrhea. Ask your health care provider if you are at risk. Ask your health care provider about whether you are at high risk for HIV. Your health care provider may recommend a prescription medicine to help prevent HIV infection. If you choose to take medicine to prevent HIV, you should first get tested for HIV. You should then be tested every 3 months for as long as you are taking the medicine. Follow these instructions at home: Alcohol use Do not drink alcohol if your health care provider tells you not to drink. If you drink alcohol: Limit how much you have to 0-2 drinks a day. Know how much alcohol is in your drink. In the [place] one drink equals one 12 oz bottle of beer (355 mL), one 5 oz glass of wine (148 mL), or one 1 oz glass of hard liquor (44 mL). Lifestyle Do not use any products that contain nicotine or tobacco. These products include cigarettes,  chewing tobacco, and vaping devices, such as e-cigarettes. If you need help quitting, ask your health care provider. Do not use street drugs. Do not share needles. Ask your health care provider for help if you need support or information about quitting drugs. General instructions Schedule regular health, dental, and eye exams. Stay current with your vaccines. Tell your health care provider if: You often feel depressed. You have ever been abused or do not feel safe at home. Summary Adopting a healthy lifestyle and getting preventive care are important in promoting health and wellness. Follow your health care provider's instructions about healthy diet, exercising, and getting tested or screened for diseases. Follow your health care provider's instructions on monitoring your cholesterol and blood pressure. This  information is not intended to replace advice given to you by your health care provider. Make sure you discuss any questions you have with your health care provider. Document Revised: 12/15/2020 Document Reviewed: 12/15/2020 Elsevier Patient Education  2023 [employer].

## 2022-12-27 NOTE — Progress Notes (Signed)
MEDICARE ANNUAL WELLNESS VISIT  12/27/2022  Telephone Visit Disclaimer This Medicare AWV was conducted by telephone due to national recommendations for restrictions regarding the COVID-19 Pandemic (e.g. social distancing).  I verified, using two identifiers, that I am speaking with Victor Price or their authorized healthcare agent. I discussed the limitations, risks, security, and privacy concerns of performing an evaluation and management service by telephone and the potential availability of an in-person appointment in the future. The patient expressed understanding and agreed to proceed.  Location of Patient: Home Location of Provider (nurse):  Provider home  Subjective:    Victor Price is a 72 y.o. male patient of [name], Barbarann Ehlers, MD who had a Medicare Annual Wellness Visit today via telephone. Nassim is Working part time and lives with their partner. he does not have any children. he reports that he is socially active and does interact with friends/family regularly. he is moderately physically active and enjoys water skiing and yard work.  Patient Care Team: Agapito Games, MD as PCP - General Gabriel Carina, [facility] as Pharmacist (Pharmacist)     12/27/2022    4:02 PM 05/05/2022    2:26 PM 12/21/2021    3:32 PM 12/01/2020    1:11 PM 08/15/2019    1:37 PM 10/02/2014    7:11 AM  Advanced Directives  Does Patient Have a Medical Advance Directive? Yes Yes Yes Yes No No  Type of Advance Directive Living will Living will Living will Healthcare Power of [place];Living will    Does patient want to make changes to medical advance directive? No - Patient declined  No - Patient declined No - Patient declined    Copy of Healthcare Power of Attorney in Chart?    No - copy requested    Would patient like information on creating a medical advance directive?     No - Patient declined No - patient declined information    Hospital Utilization Over the Past 12 Months: # of  hospitalizations or ER visits: 0 # of surgeries: 0  Review of Systems    Patient reports that his overall health is better compared to last year.  History obtained from chart review and the patient  Patient Reported Readings (BP, Pulse, CBG, Weight, etc) none  Pain Assessment Pain : No/denies pain     Current Medications & Allergies (verified) Allergies as of 12/27/2022       Reactions   Losartan Other (See Comments)   Lightheadedness, dizziness, syncope   Metoprolol Other (See Comments)   dizziness        Medication List        Accurate as of Dec 27, 2022  4:12 PM. If you have any questions, ask your nurse or doctor.          albuterol 108 (90 Base) MCG/ACT inhaler Commonly known as: VENTOLIN HFA Inhale 1-2 puffs into the lungs every 6 (six) hours as needed for wheezing or shortness of breath.   Anoro Ellipta 62.5-25 MCG/ACT Aepb Generic drug: umeclidinium-vilanterol Inhale 1 puff into the lungs daily.   atorvastatin 40 MG tablet Commonly known as: LIPITOR TAKE 1 TABLET BY MOUTH EVERYDAY AT BEDTIME   bisoprolol-hydrochlorothiazide 10-6.25 MG tablet Commonly known as: ZIAC TAKE 1 TABLET BY MOUTH EVERY DAY   Claravis 40 MG capsule Generic drug: ISOtretinoin Take 40 mg by mouth 2 (two) times daily.   [employer] Allergy Relief-D 10-240 MG 24 hr tablet Generic drug: loratadine-pseudoephedrine TAKE 1 TABLET BY MOUTH  EVERY DAY   Lidocaine-Hydrocortisone Ace 3-0.5 % Kit Commonly known as: AnaMantle HC Apply 1 dose rectally up to  Bid prn for spasm.   meclizine 25 MG tablet Commonly known as: ANTIVERT Take 1 tablet (25 mg total) by mouth 3 (three) times daily as needed for dizziness.   multivitamin tablet Take 1 tablet by mouth daily.   mupirocin ointment 2 % Commonly known as: BACTROBAN Apply topically 3 (three) times daily.   sildenafil 20 MG tablet Commonly known as: REVATIO TAKE 2 TO 5 TABLETS BY MOUTH DAILY AS NEEDED   sodium chloride HYPERTONIC 3  % nebulizer solution Inhale 4mL via nebulizer up to 2 times daily as needed to loosen congestion   tacrolimus 0.1 % ointment Commonly known as: PROTOPIC Apply 1 application. topically 2 (two) times daily.   tamsulosin 0.4 MG Caps capsule Commonly known as: FLOMAX TAKE 1 CAPSULE (0.4 MG TOTAL) BY MOUTH IN THE MORNING AND AT BEDTIME   Testosterone 10 MG/ACT (2%) Gel APPLY 1 PUMP TO AN INNER THIGH AND 2 PUMPS TO OPPOSITE INNER THIGH IN AM. FORTESTA.   traZODone 50 MG tablet Commonly known as: DESYREL TAKE 1/2 TO 2 TABLETS BY MOUTH AT BEDTIME AS NEEDED SLEEP   triamcinolone cream 0.1 % Commonly known as: KENALOG Apply topically 2 (two) times daily.        History (reviewed): Past Medical History:  Diagnosis Date   BPH (benign prostatic hyperplasia)    Bronchiectasis with acute lower respiratory infection (HCC) 08/13/2019   Bronchitis    inhaler prn   Cancer (HCC)    skin cancer?   ED (erectile dysfunction)    GERD (gastroesophageal reflux disease)    HLD (hyperlipidemia)    Hypertension    Pneumonia    gets recurrent PNA in left lung, recent dx 08/13/19 left lower lobe   Seasonal allergies    Past Surgical History:  Procedure Laterality Date   BACK SURGERY  1980s   [facility]   COLONOSCOPY  2004, 2012   several - polyps   MULTIPLE TOOTH EXTRACTIONS     TOTAL HIP ARTHROPLASTY Left 08/24/2019   Procedure: LEFT TOTAL HIP ARTHROPLASTY-DIRECT ANTERIOR;  Surgeon: Eldred Manges, MD;  Location: [facility] OR;  Service: Orthopedics;  Laterality: Left;   Family History  Problem Relation Age of Onset   Dementia Mother    Healthy Father    Social History   Socioeconomic History   Marital status: Media planner    Spouse name: Not on file   Number of children: 0   Years of education: 14   Highest education level: High school graduate  Occupational History   Occupation: [employer]    Employer: [employer]    Comment: Retired    Comment: Part-time  Tobacco Use   Smoking  status: Never   Smokeless tobacco: Never  Building services engineer Use: Never used  Substance and Sexual Activity   Alcohol use: No   Drug use: No   Sexual activity: Not on file  Other Topics Concern   Not on file  Social History Narrative   Lives with his domestic partner. Works part-time. 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      Social Determinants of Health   Financial Resource Strain: Low Risk  (12/27/2022)   Overall Financial Resource Strain (CARDIA)    Difficulty of Paying Living Expenses: Not hard at all  Food Insecurity: No Food Insecurity (12/27/2022)   Hunger Vital Sign  Worried About Programme researcher, broadcasting/film/video in the Last Year: Never true    Ran Out of Food in the Last Year: Never true  Transportation Needs: No Transportation Needs (12/27/2022)   PRAPARE - Administrator, Civil Service (Medical): No    Lack of Transportation (Non-Medical): No  Physical Activity: Sufficiently Active (12/27/2022)   Exercise Vital Sign    Days of Exercise per Week: 6 days    Minutes of Exercise per Session: 150+ min  Stress: No Stress Concern Present (12/27/2022)   [employer] of Occupational Health - Occupational Stress Questionnaire    Feeling of Stress : Not at all  Social Connections: Moderately Integrated (12/27/2022)   Social Connection and Isolation Panel [NHANES]    Frequency of Communication with Friends and Family: More than three times a week    Frequency of Social Gatherings with Friends and Family: Once a week    Attends Religious Services: More than 4 times per year    Active Member of [employer] or Organizations: No    Attends Banker Meetings: Never    Marital Status: Living with partner    Activities of Daily Living    12/27/2022    4:04 PM  In your present state of health, do you have any difficulty performing the following activities:  Hearing? 0  Vision? 0  Difficulty concentrating or making decisions? 0  Walking or climbing stairs? 0   Dressing or bathing? 0  Doing errands, shopping? 0  Preparing Food and eating ? N  Using the Toilet? N  In the past six months, have you accidently leaked urine? N  Do you have problems with loss of bowel control? N  Managing your Medications? N  Managing your Finances? N  Housekeeping or managing your Housekeeping? N    Patient Education/ Literacy How often do you need to have someone help you when you read instructions, pamphlets, or other written materials from your doctor or pharmacy?: 1 - Never What is the last grade level you completed in school?: 12th grade  Exercise Current Exercise Habits: Home exercise routine, Type of exercise: Other - see comments (physically active), Time (Minutes): > 60, Frequency (Times/Week): 6, Weekly Exercise (Minutes/Week): 0, Intensity: Moderate, Exercise limited by: None identified  Diet Patient reports consuming 3 meals a day and 4 snack(s) a day Patient reports that his primary diet is: Regular Patient reports that she does have regular access to food.   Depression Screen    12/27/2022    4:02 PM 03/23/2022    1:19 PM 01/27/2022    1:21 PM 12/21/2021    3:34 PM 12/01/2020    1:12 PM 09/10/2020    9:54 AM 06/05/2019    8:01 AM  PHQ 2/9 Scores  PHQ - 2 Score 0 0 0 0 0 0 0     Fall Risk    12/27/2022    4:02 PM 05/05/2022    2:25 PM 01/27/2022    1:21 PM 12/21/2021    3:34 PM 12/17/2021    7:04 PM  Fall Risk   Falls in the past year? 0 0 0 0 0  Number falls in past yr: 0 0 0 0   Injury with Fall? 0 0 0 0 0  Risk for fall due to : No Fall Risks  No Fall Risks No Fall Risks   Follow up Falls evaluation completed  Falls evaluation completed Falls evaluation completed      Objective:  Cedar Nardiello  Deloe seemed alert and oriented and he participated appropriately during our telephone visit.  Blood Pressure Weight BMI  BP Readings from Last 3 Encounters:  11/04/22 121/72  10/08/22 (!) 153/89  09/01/22 132/84   Wt Readings from Last 3  Encounters:  11/04/22 167 lb (75.8 kg)  10/08/22 160 lb (72.6 kg)  09/01/22 161 lb (73 kg)   BMI Readings from Last 1 Encounters:  11/04/22 22.65 kg/m    *Unable to obtain current vital signs, weight, and BMI due to telephone visit type  Hearing/Vision  Salmaan did not seem to have difficulty with hearing/understanding during the telephone conversation Reports that he has not had a formal eye exam by an eye care professional within the past year Reports that he has not had a formal hearing evaluation within the past year *Unable to fully assess hearing and vision during telephone visit type  Cognitive Function:    12/27/2022    4:06 PM 12/21/2021    3:38 PM 12/01/2020    1:16 PM  6CIT Screen  What Year? 0 points 0 points 0 points  What month? 0 points 0 points 0 points  What time? 0 points 0 points 0 points  Count back from 20 0 points 0 points 0 points  Months in reverse 0 points 0 points 0 points  Repeat phrase 0 points 0 points 0 points  Total Score 0 points 0 points 0 points   (Normal:0-7, Significant for Dysfunction: >8)  Normal Cognitive Function Screening: Yes   Immunization & Health Maintenance Record Immunization History  Administered Date(s) Administered   Fluad Quad(high Dose 65+) 04/09/2020, 05/11/2021, 06/03/2022   Influenza Split 05/17/2012   Influenza Whole 05/10/2005, 04/22/2011   Influenza, High Dose Seasonal PF 03/28/2019, 08/22/2022   Influenza,inj,Quad PF,6+ Mos 04/02/2013, 05/28/2015   Influenza-Unspecified 04/22/2016, 04/30/2017, 05/16/2018   PFIZER(Purple Top)SARS-COV-2 Vaccination 10/01/2019, 10/22/2019   PNEUMOCOCCAL CONJUGATE-20 05/11/2021   Pneumococcal Polysaccharide-23 05/10/2005   Respiratory Syncytial Virus Vaccine,Recomb Aduvanted(Arexvy) 06/03/2022   Td 08/11/1998   Tdap 11/02/2011   Zoster Recombinat (Shingrix) 12/30/2019, 02/29/2020   Zoster, Live 11/19/2011    Health Maintenance  Topic Date Due   DTaP/Tdap/Td (3 - Td or Tdap)  11/01/2021   COVID-19 Vaccine (3 - Pfizer risk series) 01/12/2023 (Originally 11/19/2019)   Hepatitis C Screening  12/27/2023 (Originally 10/13/1968)   INFLUENZA VACCINE  03/10/2023   Medicare Annual Wellness (AWV)  12/27/2023   COLONOSCOPY (Pts 45-83yrs Insurance coverage will need to be confirmed)  12/31/2031   Pneumonia Vaccine 3+ Years old  Completed   Zoster Vaccines- Shingrix  Completed   HPV VACCINES  Aged Out       Assessment  This is a routine wellness examination for [employer].  Health Maintenance: Due or Overdue Health Maintenance Due  Topic Date Due   DTaP/Tdap/Td (3 - Td or Tdap) 11/01/2021    Victor Price does not need a referral for Community Assistance: Care Management:   no Social Work:    no Prescription Assistance:  no Nutrition/Diabetes Education:  no   Plan:  Personalized Goals  Goals Addressed               This Visit's Progress     Patient Stated (pt-stated)        Patient stated that he would like to continue to maintain his current healthy lifestyle.       Personalized Health Maintenance & Screening Recommendations  Td vaccine  Lung Cancer Screening Recommended: no (Low Dose CT Chest  recommended if Age 33-80 years, 30 pack-year currently smoking OR have quit w/in past 15 years) Hepatitis C Screening recommended: no HIV Screening recommended: no  Advanced Directives: Written information was not prepared per patient's request.  Referrals & Orders No orders of the defined types were placed in this encounter.   Follow-up Plan Follow-up with Agapito Games, MD as planned Schedule tetanus shot at the pharmacy. Medicare wellness visit in one year.  Patient will access AVS on my chart.   I have personally reviewed and noted the following in the patient's chart:   Medical and social history Use of alcohol, tobacco or illicit drugs  Current medications and supplements Functional ability and status Nutritional  status Physical activity Advanced directives List of other physicians Hospitalizations, surgeries, and ER visits in previous 12 months Vitals Screenings to include cognitive, depression, and falls Referrals and appointments  In addition, I have reviewed and discussed with Victor Price certain preventive protocols, quality metrics, and best practice recommendations. A written personalized care plan for preventive services as well as general preventive health recommendations is available and can be mailed to the patient at his request.      Modesto Charon, RN BSN  12/27/2022
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
## 2023-01-13 ENCOUNTER — Ambulatory Visit: Payer: Medicare HMO | Admitting: Neurology

## 2023-01-19 DIAGNOSIS — N401 Enlarged prostate with lower urinary tract symptoms: Secondary | ICD-10-CM | POA: Diagnosis not present

## 2023-01-19 DIAGNOSIS — Z96649 Presence of unspecified artificial hip joint: Secondary | ICD-10-CM | POA: Diagnosis not present

## 2023-01-19 DIAGNOSIS — I10 Essential (primary) hypertension: Secondary | ICD-10-CM | POA: Diagnosis not present

## 2023-01-19 DIAGNOSIS — J301 Allergic rhinitis due to pollen: Secondary | ICD-10-CM | POA: Diagnosis not present

## 2023-01-19 DIAGNOSIS — J449 Chronic obstructive pulmonary disease, unspecified: Secondary | ICD-10-CM | POA: Diagnosis not present

## 2023-02-01 ENCOUNTER — Ambulatory Visit (INDEPENDENT_AMBULATORY_CARE_PROVIDER_SITE_OTHER): Payer: Medicare HMO | Admitting: Family Medicine

## 2023-02-01 ENCOUNTER — Encounter: Payer: Self-pay | Admitting: Family Medicine

## 2023-02-01 VITALS — BP 128/68 | HR 80 | Ht 72.0 in | Wt 153.0 lb

## 2023-02-01 DIAGNOSIS — N183 Chronic kidney disease, stage 3 unspecified: Secondary | ICD-10-CM | POA: Diagnosis not present

## 2023-02-01 DIAGNOSIS — R5383 Other fatigue: Secondary | ICD-10-CM | POA: Diagnosis not present

## 2023-02-01 DIAGNOSIS — R634 Abnormal weight loss: Secondary | ICD-10-CM | POA: Diagnosis not present

## 2023-02-01 DIAGNOSIS — R7301 Impaired fasting glucose: Secondary | ICD-10-CM | POA: Diagnosis not present

## 2023-02-01 DIAGNOSIS — R7989 Other specified abnormal findings of blood chemistry: Secondary | ICD-10-CM

## 2023-02-01 DIAGNOSIS — L299 Pruritus, unspecified: Secondary | ICD-10-CM | POA: Diagnosis not present

## 2023-02-01 DIAGNOSIS — E0590 Thyrotoxicosis, unspecified without thyrotoxic crisis or storm: Secondary | ICD-10-CM | POA: Diagnosis not present

## 2023-02-01 MED ORDER — AMOXICILLIN-POT CLAVULANATE 875-125 MG PO TABS: 1.0000 | ORAL_TABLET | Freq: Two times a day (BID) | ORAL | 0 refills | Status: DC

## 2023-02-01 NOTE — Progress Notes (Addendum)
Established Patient Office Visit  Subjective   Patient ID: Victor Price, male    DOB: [birth year]  Age: 72 y.o. MRN: [id number]  Chief Complaint  Patient presents with   Hypothyroidism    HPI  Here today to follow-up on labs indicating a low TSH.  Back in March TSH was 0.26.  He went to [facility] on June 17 and had some additional labs drawn.  Repeat TSH was 0.023.  Reverse T3 was high at 24.  T4 was 8.  Hemoglobin A1c was improved at 5.7.  Magnesium and testosterone levels were normal.  Will get those labs abstracted into the chart.  He says since I last saw him he has been experiencing fatigue.  Feeling restless like he has to move.  He has lost some weight but reports that he is also changed his diet he has been eating gluten-free and also decrease intake of processed foods.  He had already been avoiding sugars.  He is actually lost about 15 pounds since I saw him in March.  He also wanted to let me know that he is currently on Accutane so cannot use doxycycline when he gets respiratory infections.  He is most recent infection was a couple of weeks ago.  He would like to have a prescription that is not doxycycline on file. Sent Augmentin    ROS    Objective:     BP 128/68 (BP Location: Left Arm, Cuff Size: Normal)   Pulse 80   Ht 6' (1.829 m)   Wt 153 lb (69.4 kg)   SpO2 97%   BMI 20.75 kg/m    Physical Exam Constitutional:      Appearance: He is well-developed.  HENT:     Head: Normocephalic and atraumatic.  Eyes:     Conjunctiva/sclera: Conjunctivae normal.  Cardiovascular:     Rate and Rhythm: Normal rate and regular rhythm.     Heart sounds: Normal heart sounds.  Pulmonary:     Effort: Pulmonary effort is normal.     Breath sounds: Rhonchi present.     Comments: Rhonchi in the right lower lung Skin:    General: Skin is warm and dry.  Neurological:     Mental Status: He is alert and oriented to person, place, and time.  Psychiatric:        Behavior: Behavior  normal.      No results found for any visits on 02/01/23.    The 10-year ASCVD risk score (Arnett DK, et al., 2019) is: 16.4%    Assessment & Plan:   Problem List Items Addressed This Visit       Endocrine   Subclinical hyperthyroidism - Primary    TSH H has actually shifted downward to 0.02.  Now having fatigue and weight loss.  Will get some additional labs today.  We discussed endocrine referral.  He seemed hesitant to see a new provider.  So we will go ahead and order radioactive iodine uptake scan to get things moving.  Will call back with additional results.      Relevant Orders   VITAMIN D 25 Hydroxy (Vit-D Deficiency, Fractures)   Lipase   T4, free   T3   COMPLETE METABOLIC PANEL WITH GFR   NM Thyroid Scan/Uptake 24 Hr   IFG (impaired fasting glucose)    1C looks much better at 5.7.  Will get labs abstracted.  Follow-up in 6 months.      Other Visit Diagnoses  Low TSH level       Relevant Orders   VITAMIN D 25 Hydroxy (Vit-D Deficiency, Fractures)   Lipase   T4, free   T3   COMPLETE METABOLIC PANEL WITH GFR   NM Thyroid Scan/Uptake 24 Hr   Itching       Relevant Orders   VITAMIN D 25 Hydroxy (Vit-D Deficiency, Fractures)   Lipase   T4, free   T3   COMPLETE METABOLIC PANEL WITH GFR   Other fatigue       Relevant Orders   VITAMIN D 25 Hydroxy (Vit-D Deficiency, Fractures)   Lipase   T4, free   T3   COMPLETE METABOLIC PANEL WITH GFR   Abnormal weight loss           Itching-we will check liver function and lipase.  Could also be related to the thyroid.  No follow-ups on file.    Nani Gasser, MD

## 2023-02-01 NOTE — Assessment & Plan Note (Signed)
1C looks much better at 5.7.  Will get labs abstracted.  Follow-up in 6 months.

## 2023-02-01 NOTE — Assessment & Plan Note (Addendum)
TSH H has actually shifted downward to 0.02.  Now having fatigue and weight loss.  Will get some additional labs today.  We discussed endocrine referral.  He seemed hesitant to see a new provider.  So we will go ahead and order radioactive iodine uptake scan to get things moving.  Will call back with additional results.

## 2023-02-02 LAB — COMPLETE METABOLIC PANEL WITH GFR
AG Ratio: 1.4 (calc) (ref 1.0–2.5)
ALT: 23 U/L (ref 9–46)
AST: 21 U/L (ref 10–35)
Albumin: 4.1 g/dL (ref 3.6–5.1)
Alkaline phosphatase (APISO): 92 U/L (ref 35–144)
BUN/Creatinine Ratio: 15 (calc) (ref 6–22)
BUN: 23 mg/dL (ref 7–25)
CO2: 28 mmol/L (ref 20–32)
Calcium: 10.1 mg/dL (ref 8.6–10.3)
Chloride: 103 mmol/L (ref 98–110)
Creat: 1.5 mg/dL — ABNORMAL HIGH (ref 0.70–1.28)
Globulin: 2.9 g/dL (calc) (ref 1.9–3.7)
Glucose, Bld: 97 mg/dL (ref 65–99)
Potassium: 5 mmol/L (ref 3.5–5.3)
Sodium: 141 mmol/L (ref 135–146)
Total Bilirubin: 0.7 mg/dL (ref 0.2–1.2)
Total Protein: 7 g/dL (ref 6.1–8.1)
eGFR: 50 mL/min/{1.73_m2} — ABNORMAL LOW (ref >=60)

## 2023-02-02 LAB — VITAMIN D 25 HYDROXY (VIT D DEFICIENCY, FRACTURES): Vit D, 25-Hydroxy: 88 ng/mL (ref 30–100)

## 2023-02-02 LAB — LIPASE: Lipase: 23 U/L (ref 7–60)

## 2023-02-02 LAB — T3: T3, Total: 114 ng/dL (ref 76–181)

## 2023-02-02 LAB — T4, FREE: Free T4: 1.4 ng/dL (ref 0.8–1.8)

## 2023-02-02 NOTE — Progress Notes (Signed)
Hi Victor Price, kidney function is stable.  Liver enzymes are normal.  Vitamin D looks great.  I did check a lipase just to make sure that the pancreas did not look stressed especially with the itching and all looks normal.  Your free T4 is normal and your total T3 is also normal.  I have ordered a thyroid uptake scan so hopefully they should be contacting you soon to get you scheduled for that.  Will still likely end up needing endocrinology evaluation but we can at least start with that.

## 2023-02-25 ENCOUNTER — Encounter: Payer: Self-pay | Admitting: Family Medicine

## 2023-02-28 DIAGNOSIS — D485 Neoplasm of uncertain behavior of skin: Secondary | ICD-10-CM | POA: Diagnosis not present

## 2023-02-28 DIAGNOSIS — Z85828 Personal history of other malignant neoplasm of skin: Secondary | ICD-10-CM | POA: Diagnosis not present

## 2023-02-28 DIAGNOSIS — L821 Other seborrheic keratosis: Secondary | ICD-10-CM | POA: Diagnosis not present

## 2023-02-28 DIAGNOSIS — C44519 Basal cell carcinoma of skin of other part of trunk: Secondary | ICD-10-CM | POA: Diagnosis not present

## 2023-02-28 DIAGNOSIS — D1801 Hemangioma of skin and subcutaneous tissue: Secondary | ICD-10-CM | POA: Diagnosis not present

## 2023-02-28 DIAGNOSIS — L570 Actinic keratosis: Secondary | ICD-10-CM | POA: Diagnosis not present

## 2023-03-03 ENCOUNTER — Ambulatory Visit: Payer: Medicare HMO | Admitting: Family Medicine

## 2023-03-04 ENCOUNTER — Other Ambulatory Visit: Payer: Self-pay | Admitting: Family Medicine

## 2023-03-04 DIAGNOSIS — G4700 Insomnia, unspecified: Secondary | ICD-10-CM

## 2023-03-07 ENCOUNTER — Encounter (HOSPITAL_COMMUNITY)
Admission: RE | Admit: 2023-03-07 | Discharge: 2023-03-07 | Disposition: A | Discharge status: Home / Self Care | Payer: Medicare HMO | Source: Ambulatory Visit | Attending: Family Medicine | Admitting: Family Medicine

## 2023-03-07 DIAGNOSIS — E0590 Thyrotoxicosis, unspecified without thyrotoxic crisis or storm: Secondary | ICD-10-CM | POA: Diagnosis not present

## 2023-03-07 DIAGNOSIS — R7989 Other specified abnormal findings of blood chemistry: Secondary | ICD-10-CM | POA: Insufficient documentation

## 2023-03-07 MED ORDER — SODIUM IODIDE I 131 CAPSULE
20.5000 | Freq: Once | INTRAVENOUS | Status: AC
  Administered 2023-03-07: 20.5 via ORAL

## 2023-03-08 ENCOUNTER — Encounter (HOSPITAL_COMMUNITY)
Admission: RE | Admit: 2023-03-08 | Discharge: 2023-03-08 | Disposition: A | Discharge status: Home / Self Care | Payer: Medicare HMO | Source: Ambulatory Visit | Attending: Family Medicine | Admitting: Family Medicine

## 2023-03-08 DIAGNOSIS — E0590 Thyrotoxicosis, unspecified without thyrotoxic crisis or storm: Secondary | ICD-10-CM | POA: Diagnosis not present

## 2023-03-08 DIAGNOSIS — E042 Nontoxic multinodular goiter: Secondary | ICD-10-CM | POA: Diagnosis not present

## 2023-03-08 MED ORDER — SODIUM PERTECHNETATE TC 99M INJECTION
4.0000 | Freq: Once | INTRAVENOUS | Status: AC | PRN
  Administered 2023-03-08: 4.13 via INTRAVENOUS

## 2023-03-17 ENCOUNTER — Encounter: Payer: Self-pay | Admitting: Family Medicine

## 2023-03-17 DIAGNOSIS — R1310 Dysphagia, unspecified: Secondary | ICD-10-CM

## 2023-03-17 DIAGNOSIS — R7989 Other specified abnormal findings of blood chemistry: Secondary | ICD-10-CM

## 2023-03-18 ENCOUNTER — Other Ambulatory Visit: Payer: Self-pay

## 2023-03-18 NOTE — Progress Notes (Signed)
Victor Price, I sent you a note via MyChart but also will put an addendum on this imaging test.  It was normal uptake which is very reassuring.  You do have some small nodules that are not worrisome but because you have several of them we also called a multinodular thyroid gland.  You do have what looks like probably a cold nodule on the right gland.  This is an area that does not take up the uptake in other words it is not working very well and is not secreting the hormone like it typically would.  Neck step would be to refer you to endocrine especially since you are also having some symptoms.

## 2023-03-20 ENCOUNTER — Encounter: Payer: Self-pay | Admitting: Family Medicine

## 2023-03-24 MED ORDER — AMOXICILLIN-POT CLAVULANATE 875-125 MG PO TABS: 1.0000 | ORAL_TABLET | Freq: Two times a day (BID) | ORAL | 0 refills | Status: DC

## 2023-03-24 NOTE — Telephone Encounter (Signed)
Rx sent. Just remind him he needs to let us know when he uses it and if he is not improving after a week needs to be evaluated.

## 2023-03-27 ENCOUNTER — Other Ambulatory Visit: Payer: Self-pay | Admitting: Family Medicine

## 2023-03-27 DIAGNOSIS — E785 Hyperlipidemia, unspecified: Secondary | ICD-10-CM

## 2023-03-29 NOTE — Addendum Note (Signed)
Addended by: Nani Gasser D on: 03/29/2023 03:01 PM   Modules accepted: Orders

## 2023-03-29 NOTE — Telephone Encounter (Signed)
Orders Placed This Encounter  Procedures   Ambulatory referral to ENT    Referral Priority:   Routine    Referral Type:   Consultation    Referral Reason:   Specialty Services Required    Requested Specialty:   Otolaryngology    Number of Visits Requested:   1   Ambulatory referral to Endocrinology    Referral Priority:   Routine    Referral Type:   Consultation    Referral Reason:   Specialty Services Required    Number of Visits Requested:   1   Refer sent to Dr. Shawnee Knapp group on [address] for endocrine

## 2023-04-13 DIAGNOSIS — N1831 Chronic kidney disease, stage 3a: Secondary | ICD-10-CM | POA: Diagnosis not present

## 2023-04-16 ENCOUNTER — Other Ambulatory Visit: Payer: Self-pay | Admitting: Family Medicine

## 2023-04-16 DIAGNOSIS — E785 Hyperlipidemia, unspecified: Secondary | ICD-10-CM

## 2023-04-16 DIAGNOSIS — I10 Essential (primary) hypertension: Secondary | ICD-10-CM

## 2023-04-19 ENCOUNTER — Ambulatory Visit: Payer: Medicare HMO | Admitting: Orthopaedic Surgery

## 2023-04-19 ENCOUNTER — Other Ambulatory Visit (INDEPENDENT_AMBULATORY_CARE_PROVIDER_SITE_OTHER): Payer: Medicare HMO

## 2023-04-19 VITALS — BP 160/85 | HR 84

## 2023-04-19 DIAGNOSIS — M25551 Pain in right hip: Secondary | ICD-10-CM | POA: Insufficient documentation

## 2023-04-19 DIAGNOSIS — Z96642 Presence of left artificial hip joint: Secondary | ICD-10-CM | POA: Diagnosis not present

## 2023-04-19 NOTE — Progress Notes (Addendum)
Office Visit Note   Patient: Victor Price           Date of Birth: 09-08-1950           MRN: [id number] Visit Date: 04/19/2023              Requested by: Agapito Games, MD [address] [place],  [place] [address] PCP: Agapito Games, MD   Assessment & Plan: Visit Diagnoses:  1. Pain of right hip   2. Pain in right hip   3. H/O total hip arthroplasty, left     Plan: Will recheck him in 2 months.  If he develops significant increase in pain prior to that he will let us know.  Follow-Up Instructions: Return in about 2 months (around 06/19/2023).   Orders:  Orders Placed This Encounter  Procedures   XR HIP UNILAT W OR W/O PELVIS 1V RIGHT   No orders of the defined types were placed in this encounter.     Procedures: No procedures performed   Clinical Data: No additional findings.   Subjective: Chief Complaint  Patient presents with   Right Hip - Pain    HPI 72 year old male with some peripheral neuropathy, lumbar MRI with minimal changes and previous left total hip arthroplasty January 2021.  He had recently increased right groin pain and some limping and is concerned that he may need a right total hip arthroplasty.  States actually today is doing better.  No history of crystal arthropathy.  No chills or fever.  Has had carpal tunnel release.  History of stage III kidney disease.  Review of Systems all systems noncontributory to HPI.   Objective: Vital Signs: BP (!) 160/85   Pulse 84   Physical Exam Constitutional:      Appearance: He is well-developed.  HENT:     Head: Normocephalic and atraumatic.     Right Ear: External ear normal.     Left Ear: External ear normal.  Eyes:     Pupils: Pupils are equal, round, and reactive to light.  Neck:     Thyroid: No thyromegaly.     Trachea: No tracheal deviation.  Cardiovascular:     Rate and Rhythm: Normal rate.  Pulmonary:     Effort: Pulmonary effort is normal.     Breath  sounds: No wheezing.  Abdominal:     General: Bowel sounds are normal.     Palpations: Abdomen is soft.  Musculoskeletal:     Cervical back: Neck supple.  Skin:    General: Skin is warm and dry.     Capillary Refill: Capillary refill takes less than 2 seconds.  Neurological:     Mental Status: He is alert and oriented to person, place, and time.  Psychiatric:        Behavior: Behavior normal.        Thought Content: Thought content normal.        Judgment: Judgment normal.     Ortho Exam minimal discomfort logroll right hip he is amatory without Trendelenburg gait.  Leg lengths are equal.  No rash or exposed skin.  Stocking distribution numbness bilaterally distal tibia down.  No plantar foot lesions patient is in sandals.  Specialty Comments:  No specialty comments available.  Imaging: XR HIP UNILAT W OR W/O PELVIS 1V RIGHT  Result Date: 04/19/2023 Standing AP pelvis frog-leg right hip demonstrates previous left total arthroplasty without loosening or subsidence.  Right hip shows minimal joint  space narrowing no significant osteophytes or subchondral cyst. Impression: Minimal right hip osteoarthritis previous left total hip arthroplasty.    PMFS History: Patient Active Problem List   Diagnosis Date Noted   Pain in right hip 04/19/2023   IFG (impaired fasting glucose) 02/01/2023   Hemorrhoid 11/04/2022   Cystic acne 11/04/2022   Left inguinal hernia 01/27/2022   Carpal tunnel syndrome, left upper limb 12/04/2019   H/O total hip arthroplasty, left 10/19/2019   DDD (degenerative disc disease), lumbar 06/05/2019   Subclinical hyperthyroidism 10/31/2017   Hypogonadism in male 10/31/2017   CKD (chronic kidney disease) stage 3, GFR 30-59 ml/min (HCC) 02/08/2017   Bronchiectasis (HCC) 07/20/2016   Radiculitis of right cervical region 08/28/2014   DDD (degenerative disc disease), cervical 08/28/2014   Insomnia 04/30/2014   Peripheral neuropathy 04/02/2013   Herniated disc  01/29/2013   BPH (benign prostatic hyperplasia) 12/14/2011   ERECTILE DYSFUNCTION, ORGANIC 07/23/2010   THYROID NODULE, LEFT 05/23/2007   Hyperlipidemia 05/17/2006   HYPERTENSION, BENIGN SYSTEMIC 05/17/2006   Past Medical History:  Diagnosis Date   BPH (benign prostatic hyperplasia)    Bronchiectasis with acute lower respiratory infection (HCC) 08/13/2019   Bronchitis    inhaler prn   Cancer (HCC)    skin cancer?   ED (erectile dysfunction)    GERD (gastroesophageal reflux disease)    HLD (hyperlipidemia)    Hypertension    Pneumonia    gets recurrent PNA in left lung, recent dx 08/13/19 left lower lobe   Seasonal allergies     Family History  Problem Relation Age of Onset   Dementia Mother    Healthy Father     Past Surgical History:  Procedure Laterality Date   BACK SURGERY  1980s   [facility]   COLONOSCOPY  2004, 2012   several - polyps   MULTIPLE TOOTH EXTRACTIONS     TOTAL HIP ARTHROPLASTY Left 08/24/2019   Procedure: LEFT TOTAL HIP ARTHROPLASTY-DIRECT ANTERIOR;  Surgeon: Eldred Manges, MD;  Location: [facility] OR;  Service: Orthopedics;  Laterality: Left;   Social History   Occupational History   Occupation: [employer]    Employer: [employer]    Comment: Retired    Comment: Part-time  Tobacco Use   Smoking status: Never   Smokeless tobacco: Never  Vaping Use   Vaping status: Never Used  Substance and Sexual Activity   Alcohol use: No   Drug use: No   Sexual activity: Not on file

## 2023-04-22 ENCOUNTER — Other Ambulatory Visit: Payer: Self-pay | Admitting: Family Medicine

## 2023-05-09 ENCOUNTER — Encounter: Payer: Self-pay | Admitting: Family Medicine

## 2023-05-09 ENCOUNTER — Ambulatory Visit (INDEPENDENT_AMBULATORY_CARE_PROVIDER_SITE_OTHER): Payer: Medicare HMO | Admitting: Family Medicine

## 2023-05-09 VITALS — BP 127/73 | HR 80 | Ht 72.0 in | Wt 148.0 lb

## 2023-05-09 DIAGNOSIS — R7301 Impaired fasting glucose: Secondary | ICD-10-CM | POA: Diagnosis not present

## 2023-05-09 DIAGNOSIS — E0590 Thyrotoxicosis, unspecified without thyrotoxic crisis or storm: Secondary | ICD-10-CM

## 2023-05-09 DIAGNOSIS — I10 Essential (primary) hypertension: Secondary | ICD-10-CM

## 2023-05-09 DIAGNOSIS — J3489 Other specified disorders of nose and nasal sinuses: Secondary | ICD-10-CM

## 2023-05-09 DIAGNOSIS — N1831 Chronic kidney disease, stage 3a: Secondary | ICD-10-CM

## 2023-05-09 DIAGNOSIS — J470 Bronchiectasis with acute lower respiratory infection: Secondary | ICD-10-CM

## 2023-05-09 DIAGNOSIS — Z23 Encounter for immunization: Secondary | ICD-10-CM

## 2023-05-09 LAB — POCT GLYCOSYLATED HEMOGLOBIN (HGB A1C): Hemoglobin A1C: 5.5 % (ref 4.0–5.6)

## 2023-05-09 MED ORDER — AMOXICILLIN-POT CLAVULANATE 875-125 MG PO TABS: 1.0000 | ORAL_TABLET | Freq: Two times a day (BID) | ORAL | 0 refills | Status: DC

## 2023-05-09 NOTE — Assessment & Plan Note (Signed)
Well controlled. Continue current regimen. Follow up in  6 mo  

## 2023-05-09 NOTE — Assessment & Plan Note (Signed)
Did go ahead and refill the Augmentin for him to have on hand.  We did discuss that he will call me as soon as he starts the medication and let me know if he is not improving or if he develops new or worsening symptoms.  He did just complete around in August.

## 2023-05-09 NOTE — Assessment & Plan Note (Signed)
He does have an appointment in November so since it has been several months since we last checked his labs would like to go ahead and get that updated again today.

## 2023-05-09 NOTE — Assessment & Plan Note (Signed)
A1C looks phenomenal at 5.5 today. Eats not suger and no carb diet.

## 2023-05-09 NOTE — Progress Notes (Signed)
Established Patient Office Visit  Subjective   Patient ID: TEVONTE DERDA, male    DOB: 1951-01-25  Age: 72 y.o. MRN: [id number]  Chief Complaint  Patient presents with   Hypertension    HPI  Hypertension- Pt denies chest pain, SOB, dizziness, or heart palpitations.  Taking meds as directed w/o problems.  Denies medication side effects.    Impaired fasting glucose-no increased thirst or urination. No symptoms consistent with hypoglycemia.  Asking about vaccines.      ENT appt schedule for next month.   Endocrinology appt scheduled for November.    Gets a runny nose when he eats.  Also get more nasal congestion in the evenings.     ROS    Objective:     BP 127/73   Pulse 80   Ht 6' (1.829 m)   Wt 148 lb (67.1 kg)   SpO2 100%   BMI 20.07 kg/m    Physical Exam Vitals and nursing note reviewed.  Constitutional:      Appearance: Normal appearance.  HENT:     Head: Normocephalic and atraumatic.  Eyes:     Conjunctiva/sclera: Conjunctivae normal.  Cardiovascular:     Rate and Rhythm: Normal rate and regular rhythm.  Pulmonary:     Effort: Pulmonary effort is normal.     Breath sounds: Normal breath sounds.  Skin:    General: Skin is warm and dry.  Neurological:     Mental Status: He is alert.  Psychiatric:        Mood and Affect: Mood normal.      Results for orders placed or performed in visit on 05/09/23  POCT HgB A1C  Result Value Ref Range   Hemoglobin A1C 5.5 4.0 - 5.6 %   HbA1c POC (<> result, manual entry)     HbA1c, POC (prediabetic range)     HbA1c, POC (controlled diabetic range)        The 10-year ASCVD risk score (Arnett DK, et al., 2019) is: 16.2%    Assessment & Plan:   Problem List Items Addressed This Visit       Cardiovascular and Mediastinum   HYPERTENSION, BENIGN SYSTEMIC - Primary    Well controlled. Continue current regimen. Follow up in  49mo       Relevant Orders   TSH + free T4   Urine Microalbumin w/creat.  ratio   Basic Metabolic Panel (BMET)     Respiratory   Bronchiectasis (HCC)    Did go ahead and refill the Augmentin for him to have on hand.  We did discuss that he will call me as soon as he starts the medication and let me know if he is not improving or if he develops new or worsening symptoms.  He did just complete around in August.        Endocrine   Subclinical hyperthyroidism    He does have an appointment in November so since it has been several months since we last checked his labs would like to go ahead and get that updated again today.      Relevant Orders   TSH + free T4   Urine Microalbumin w/creat. ratio   Basic Metabolic Panel (BMET)   IFG (impaired fasting glucose)    A1C looks phenomenal at 5.5 today. Eats not suger and no carb diet.       Relevant Orders   POCT HgB A1C (Completed)   TSH + free T4   Urine Microalbumin w/creat.  ratio   Basic Metabolic Panel (BMET)     Genitourinary   CKD (chronic kidney disease) stage 3, GFR 30-59 ml/min (HCC)    Currently on Farxiga and tolerating well without any side effects or problems.      Other Visit Diagnoses     Encounter for immunization       Relevant Orders   Flu Vaccine Trivalent High Dose (Fluad) (Completed)   Rhinorrhea          Gustatory rhinorrhea-benign condition.  Did discuss that we can use a nasal spray if needed to help.  Return in about 6 months (around 11/06/2023) for Pre-diabetes, Hypertension.    Nani Gasser, MD

## 2023-05-09 NOTE — Assessment & Plan Note (Signed)
Currently on Farxiga and tolerating well without any side effects or problems.

## 2023-05-10 LAB — BASIC METABOLIC PANEL
BUN/Creatinine Ratio: 16 (ref 10–24)
BUN: 25 mg/dL (ref 8–27)
CO2: 27 mmol/L (ref 20–29)
Calcium: 10.4 mg/dL — ABNORMAL HIGH (ref 8.6–10.2)
Chloride: 99 mmol/L (ref 96–106)
Creatinine, Ser: 1.58 mg/dL — ABNORMAL HIGH (ref 0.76–1.27)
Glucose: 95 mg/dL (ref 70–99)
Potassium: 4.4 mmol/L (ref 3.5–5.2)
Sodium: 140 mmol/L (ref 134–144)
eGFR: 47 mL/min/{1.73_m2} — ABNORMAL LOW (ref >59)

## 2023-05-10 LAB — TSH+FREE T4
Free T4: 2.09 ng/dL — ABNORMAL HIGH (ref 0.82–1.77)
TSH: <0.005 u[IU]/mL — ABNORMAL LOW (ref 0.450–4.500)

## 2023-05-10 LAB — MICROALBUMIN / CREATININE URINE RATIO
Creatinine, Urine: 58.3 mg/dL
Microalb/Creat Ratio: 84 mg/g{creat} — ABNORMAL HIGH (ref 0–29)
Microalbumin, Urine: 48.7 ug/mL

## 2023-05-11 ENCOUNTER — Encounter: Payer: Self-pay | Admitting: Family Medicine

## 2023-05-11 MED ORDER — METHIMAZOLE 10 MG PO TABS: 10.0000 mg | ORAL_TABLET | Freq: Two times a day (BID) | ORAL | 2 refills | Status: DC

## 2023-05-11 NOTE — Progress Notes (Signed)
Hi Victor Price, your thyroid level came back even lower this time.  I know your appointment with endocrine is not until later in the year.  Some gena go ahead and send over a prescription of methimazole. Kidney function is stable at 1.5.  Spilling a little bit of protein in the urine but it does look much better than it did 6 months ago and a year ago.  Please call lab and see if we can add thyroid antibodies as well as a total T3.

## 2023-05-11 NOTE — Addendum Note (Signed)
Addended by: Nani Gasser D on: 05/11/2023 08:28 AM   Modules accepted: Orders

## 2023-05-13 LAB — SPECIMEN STATUS REPORT

## 2023-05-13 LAB — THYROID ANTIBODIES
Thyroglobulin Antibody: <1 [IU]/mL (ref 0.0–0.9)
Thyroperoxidase Ab SerPl-aCnc: <9 [IU]/mL (ref 0–34)

## 2023-05-13 LAB — T3: T3, Total: 141 ng/dL (ref 71–180)

## 2023-05-13 NOTE — Progress Notes (Signed)
Hi Victor Price, he did have the lab add thyroid antibodies which were negative.  We will send over the updated labs to Endocrine and see if they want to see you sooner.    Mickie Bail can you send over labs from earlier this week and this one? Thank you

## 2023-05-13 NOTE — Progress Notes (Signed)
Lab report has been faxed to [facility] at [phone]. Patient has an appointment scheduled for 06/28/2023 at 1:00 pm with Dr. Warden Fillers.

## 2023-05-16 DIAGNOSIS — Z79899 Other long term (current) drug therapy: Secondary | ICD-10-CM | POA: Diagnosis not present

## 2023-05-16 DIAGNOSIS — L700 Acne vulgaris: Secondary | ICD-10-CM | POA: Diagnosis not present

## 2023-06-21 ENCOUNTER — Encounter: Payer: Self-pay | Admitting: Orthopaedic Surgery

## 2023-06-21 ENCOUNTER — Ambulatory Visit: Payer: Medicare HMO | Admitting: Orthopaedic Surgery

## 2023-06-21 VITALS — Ht 72.0 in | Wt 160.0 lb

## 2023-06-21 DIAGNOSIS — M5136 Other intervertebral disc degeneration, lumbar region with discogenic back pain only: Secondary | ICD-10-CM | POA: Diagnosis not present

## 2023-06-21 DIAGNOSIS — Z96642 Presence of left artificial hip joint: Secondary | ICD-10-CM | POA: Diagnosis not present

## 2023-06-22 NOTE — Progress Notes (Signed)
Office Visit Note   Patient: Victor Price           Date of Birth: 1950/11/06           MRN: [id number] Visit Date: 06/21/2023              Requested by: Agapito Games, MD [address] [place],  [place] [address] PCP: Agapito Games, MD   Assessment & Plan: Visit Diagnoses:  1. Degeneration of intervertebral disc of lumbar region with discogenic back pain   2. H/O total hip arthroplasty, left     Plan: Reviewed MRI scan with patient and plain radiographs.Fatigue his symptoms are likely related to the lumbar spine and not his mild right hip osteoarthritis.  If he develops neurogenic claudication or weakness he can return.  Follow-Up Instructions: Return if symptoms worsen or fail to improve.   Orders:  No orders of the defined types were placed in this encounter.  No orders of the defined types were placed in this encounter.     Procedures: No procedures performed   Clinical Data: No additional findings.   Subjective: Chief Complaint  Patient presents with   Right Hip - Pain, Follow-up    HPI 72 year old male returns he has some ongoing discomfort right buttocks.  He has had previous left total hip arthroplasty by me anterior approach opposite left hip 2021 doing well.  X-rays has shown some hip arthritis on the right.  Previous surgery not done by me L5-S1 on the right.  He has multilevel disc degeneration with some foraminal narrowing and moderate stenosis at L3-4 and L4-5 but does not have claudication symptoms.  Review of Systems all other systems updated unchanged.   Objective: Vital Signs: Ht 6' (1.829 m)   Wt 160 lb (72.6 kg)   BMI 21.70 kg/m   Physical Exam Constitutional:      Appearance: He is well-developed.  HENT:     Head: Normocephalic and atraumatic.     Right Ear: External ear normal.     Left Ear: External ear normal.  Eyes:     Pupils: Pupils are equal, round, and reactive to light.  Neck:     Thyroid:  No thyromegaly.     Trachea: No tracheal deviation.  Cardiovascular:     Rate and Rhythm: Normal rate.  Pulmonary:     Effort: Pulmonary effort is normal.     Breath sounds: No wheezing.  Abdominal:     General: Bowel sounds are normal.     Palpations: Abdomen is soft.  Musculoskeletal:     Cervical back: Neck supple.  Skin:    General: Skin is warm and dry.     Capillary Refill: Capillary refill takes less than 2 seconds.  Neurological:     Mental Status: He is alert and oriented to person, place, and time.  Psychiatric:        Behavior: Behavior normal.        Thought Content: Thought content normal.        Judgment: Judgment normal.     Ortho Exam no trochanteric bursal tenderness.  Limited internal rotation 20 degrees right hip particularly painful internal/external rotation left hip is normal.  No hip flexion contracture reflexes are intact pedal pulses dorsalis pedis posterior tib right and left are both bounding and symmetrical.  Negative straight leg raising 90 degrees.  He can heel and toe walk normally without weakness.  Specialty Comments:  No specialty comments  available.  Imaging: No results found.   PMFS History: Patient Active Problem List   Diagnosis Date Noted   Pain in right hip 04/19/2023   IFG (impaired fasting glucose) 02/01/2023   Hemorrhoid 11/04/2022   Cystic acne 11/04/2022   Left inguinal hernia 01/27/2022   Carpal tunnel syndrome, left upper limb 12/04/2019   H/O total hip arthroplasty, left 10/19/2019   DDD (degenerative disc disease), lumbar 06/05/2019   Hypothyroidism 10/31/2017   Hypogonadism in male 10/31/2017   CKD (chronic kidney disease) stage 3, GFR 30-59 ml/min (HCC) 02/08/2017   Bronchiectasis (HCC) 07/20/2016   Radiculitis of right cervical region 08/28/2014   DDD (degenerative disc disease), cervical 08/28/2014   Insomnia 04/30/2014   Peripheral neuropathy 04/02/2013   Herniated disc 01/29/2013   BPH (benign prostatic  hyperplasia) 12/14/2011   ERECTILE DYSFUNCTION, ORGANIC 07/23/2010   THYROID NODULE, LEFT 05/23/2007   Hyperlipidemia 05/17/2006   HYPERTENSION, BENIGN SYSTEMIC 05/17/2006   Past Medical History:  Diagnosis Date   BPH (benign prostatic hyperplasia)    Bronchiectasis with acute lower respiratory infection (HCC) 08/13/2019   Bronchitis    inhaler prn   Cancer (HCC)    skin cancer?   ED (erectile dysfunction)    GERD (gastroesophageal reflux disease)    HLD (hyperlipidemia)    Hypertension    Pneumonia    gets recurrent PNA in left lung, recent dx 08/13/19 left lower lobe   Seasonal allergies     Family History  Problem Relation Age of Onset   Dementia Mother    Healthy Father     Past Surgical History:  Procedure Laterality Date   BACK SURGERY  1980s   [facility]   COLONOSCOPY  2004, 2012   several - polyps   MULTIPLE TOOTH EXTRACTIONS     TOTAL HIP ARTHROPLASTY Left 08/24/2019   Procedure: LEFT TOTAL HIP ARTHROPLASTY-DIRECT ANTERIOR;  Surgeon: Eldred Manges, MD;  Location: [facility] OR;  Service: Orthopedics;  Laterality: Left;   Social History   Occupational History   Occupation: [employer]    Employer: [employer]    Comment: Retired    Comment: Part-time  Tobacco Use   Smoking status: Never   Smokeless tobacco: Never  Vaping Use   Vaping status: Never Used  Substance and Sexual Activity   Alcohol use: No   Drug use: No   Sexual activity: Not on file

## 2023-06-26 ENCOUNTER — Ambulatory Visit
Admission: EM | Admit: 2023-06-26 | Discharge: 2023-06-26 | Disposition: A | Discharge status: Home / Self Care | Payer: Medicare HMO

## 2023-06-26 ENCOUNTER — Encounter (HOSPITAL_BASED_OUTPATIENT_CLINIC_OR_DEPARTMENT_OTHER): Payer: Self-pay | Admitting: Emergency Medicine

## 2023-06-26 ENCOUNTER — Emergency Department (HOSPITAL_BASED_OUTPATIENT_CLINIC_OR_DEPARTMENT_OTHER): Payer: Medicare HMO

## 2023-06-26 ENCOUNTER — Other Ambulatory Visit: Payer: Self-pay

## 2023-06-26 ENCOUNTER — Emergency Department (HOSPITAL_BASED_OUTPATIENT_CLINIC_OR_DEPARTMENT_OTHER)
Admission: EM | Admit: 2023-06-26 | Discharge: 2023-06-26 | Disposition: A | Discharge status: Home / Self Care | Payer: Medicare HMO | Attending: Emergency Medicine | Admitting: Emergency Medicine

## 2023-06-26 DIAGNOSIS — N189 Chronic kidney disease, unspecified: Secondary | ICD-10-CM | POA: Insufficient documentation

## 2023-06-26 DIAGNOSIS — Z79899 Other long term (current) drug therapy: Secondary | ICD-10-CM | POA: Insufficient documentation

## 2023-06-26 DIAGNOSIS — S0990XA Unspecified injury of head, initial encounter: Secondary | ICD-10-CM

## 2023-06-26 DIAGNOSIS — I129 Hypertensive chronic kidney disease with stage 1 through stage 4 chronic kidney disease, or unspecified chronic kidney disease: Secondary | ICD-10-CM | POA: Diagnosis not present

## 2023-06-26 DIAGNOSIS — S0101XA Laceration without foreign body of scalp, initial encounter: Secondary | ICD-10-CM | POA: Insufficient documentation

## 2023-06-26 DIAGNOSIS — Z859 Personal history of malignant neoplasm, unspecified: Secondary | ICD-10-CM | POA: Diagnosis not present

## 2023-06-26 DIAGNOSIS — R220 Localized swelling, mass and lump, head: Secondary | ICD-10-CM | POA: Diagnosis not present

## 2023-06-26 DIAGNOSIS — W208XXA Other cause of strike by thrown, projected or falling object, initial encounter: Secondary | ICD-10-CM | POA: Diagnosis not present

## 2023-06-26 NOTE — ED Provider Notes (Signed)
[facility]    CSN: [id number] Arrival date & time: 06/26/23  1344      History   Chief Complaint No chief complaint on file.   HPI Victor Price is a 72 y.o. male.   HPI 72 year old-year-old male presents with head injury today.  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  Patient is accompanied by his wife who drove him here today.  PMH significant for HTN, HLD, and GERD.  Past Medical History:  Diagnosis Date   BPH (benign prostatic hyperplasia)    Bronchiectasis with acute lower respiratory infection (HCC) 08/13/2019   Bronchitis    inhaler prn   Cancer (HCC)    skin cancer?   ED (erectile dysfunction)    GERD (gastroesophageal reflux disease)    HLD (hyperlipidemia)    Hypertension    Pneumonia    gets recurrent PNA in left lung, recent dx 08/13/19 left lower lobe   Seasonal allergies     Patient Active Problem List   Diagnosis Date Noted   Pain in right hip 04/19/2023   IFG (impaired fasting glucose) 02/01/2023   Hemorrhoid 11/04/2022   Cystic acne 11/04/2022   Left inguinal hernia 01/27/2022   Carpal tunnel syndrome, left upper limb 12/04/2019   H/O total hip arthroplasty, left 10/19/2019   DDD (degenerative disc disease), lumbar 06/05/2019   Hypothyroidism 10/31/2017   Hypogonadism in male 10/31/2017   CKD (chronic kidney disease) stage 3, GFR 30-59 ml/min (HCC) 02/08/2017   Bronchiectasis (HCC) 07/20/2016   Radiculitis of right cervical region 08/28/2014   DDD (degenerative disc disease), cervical 08/28/2014   Insomnia 04/30/2014   Peripheral neuropathy 04/02/2013   Herniated disc 01/29/2013   BPH (benign prostatic hyperplasia) 12/14/2011   ERECTILE DYSFUNCTION, ORGANIC 07/23/2010   THYROID NODULE, LEFT 05/23/2007   Hyperlipidemia 05/17/2006   HYPERTENSION, BENIGN SYSTEMIC 05/17/2006    Past Surgical History:  Procedure Laterality Date   BACK SURGERY  1980s   [facility]    COLONOSCOPY  2004, 2012   several - polyps   MULTIPLE TOOTH EXTRACTIONS     TOTAL HIP ARTHROPLASTY Left 08/24/2019   Procedure: LEFT TOTAL HIP ARTHROPLASTY-DIRECT ANTERIOR;  Surgeon: Eldred Manges, MD;  Location: [facility] OR;  Service: Orthopedics;  Laterality: Left;       Home Medications    Prior to Admission medications   Medication Sig Start Date End Date Taking? Authorizing Provider  amoxicillin-clavulanate (AUGMENTIN) 875-125 MG tablet Take 1 tablet by mouth 2 (two) times daily. 05/09/23   Agapito Games, MD  atorvastatin (LIPITOR) 40 MG tablet TAKE 1 TABLET BY MOUTH EVERYDAY AT BEDTIME 03/31/23   Agapito Games, MD  bisoprolol-hydrochlorothiazide [facility]) 10-6.25 MG tablet TAKE 1 TABLET BY MOUTH EVERY DAY 04/22/23   Agapito Games, MD  CLARAVIS 40 MG capsule Take 40 mg by mouth 2 (two) times daily. 02/26/22   [provider]  [facility] ALLERGY RELIEF-D 10-240 MG 24 hr tablet TAKE 1 TABLET BY MOUTH EVERY DAY 08/23/22   Agapito Games, MD  FARXIGA 10 MG TABS tablet TAKE 1 TABLET BY MOUTH EVERY DAY 04/22/23   Agapito Games, MD  Lidocaine-Hydrocortisone Ace (ANAMANTLE HC) 3-0.5 % KIT Apply 1 dose rectally up to  Bid prn for spasm. 11/04/22   Agapito Games, MD  meclizine (ANTIVERT) 25 MG tablet Take 1 tablet (25 mg total) by mouth 3 (three) times daily as needed for dizziness.  02/18/22   Agapito Games, MD  methimazole (TAPAZOLE) 10 MG tablet Take 1 tablet (10 mg total) by mouth 2 (two) times daily. 05/11/23   Agapito Games, MD  Multiple Vitamin (MULTIVITAMIN) tablet Take 1 tablet by mouth daily.    [provider]  mupirocin ointment (BACTROBAN) 2 % Apply topically 3 (three) times daily. 09/11/20   Agapito Games, MD  sildenafil (REVATIO) 20 MG tablet TAKE 2 TO 5 TABLETS BY MOUTH DAILY AS NEEDED 12/17/22   Agapito Games, MD  sodium chloride HYPERTONIC 3 % nebulizer solution Inhale 4mL via nebulizer up to 2 times daily as  needed to loosen congestion 10/21/22   Glenford Bayley, NP  tacrolimus (PROTOPIC) 0.1 % ointment Apply 1 application. topically 2 (two) times daily. 11/17/21   [provider]  tamsulosin (FLOMAX) 0.4 MG CAPS capsule TAKE 1 CAPSULE (0.4 MG TOTAL) BY MOUTH IN THE MORNING AND AT BEDTIME 11/01/22   Agapito Games, MD  Testosterone 10 MG/ACT (2%) GEL APPLY 1 PUMP TO AN INNER THIGH AND 2 PUMPS TO OPPOSITE INNER THIGH IN AM. FORTESTA. 11/04/22   Agapito Games, MD  traZODone (DESYREL) 50 MG tablet TAKE 1/2 TO 2 TABLETS BY MOUTH AT BEDTIME AS NEEDED SLEEP 03/07/23   Agapito Games, MD  triamcinolone cream (KENALOG) 0.1 % Apply topically 2 (two) times daily. 10/19/21   [provider]  umeclidinium-vilanterol (ANORO ELLIPTA) 62.5-25 MCG/ACT AEPB Inhale 1 puff into the lungs daily. 11/04/22   Agapito Games, MD    Family History Family History  Problem Relation Age of Onset   Dementia Mother    Healthy Father     Social History Social History   Tobacco Use   Smoking status: Never   Smokeless tobacco: Never  Vaping Use   Vaping status: Never Used  Substance Use Topics   Alcohol use: No   Drug use: No     Allergies   Losartan and Metoprolol   Review of Systems Review of Systems  Neurological:        Head injury 45 minutes ago secondary to trailer hitch falling down on hitting head     Physical Exam Triage Vital Signs ED Triage Vitals [06/26/23 1347]  Encounter Vitals Group     BP 138/84     Systolic BP Percentile      Diastolic BP Percentile      Pulse Rate 83     Resp 16     Temp 97.6 F (36.4 C)     Temp src      SpO2 98 %     Weight      Height      Head Circumference      Peak Flow      Pain Score      Pain Loc      Pain Education      Exclude from Growth Chart    No data found.  Updated Vital Signs BP 138/84   Pulse 83   Temp 97.6 F (36.4 C)   Resp 16   SpO2 98%    Physical Exam Vitals and nursing note  reviewed.  Constitutional:      Appearance: Normal appearance. He is normal weight.  HENT:     Head: Normocephalic and atraumatic.     Mouth/Throat:     Mouth: Mucous membranes are moist.     Pharynx: Oropharynx is clear.  Eyes:     Extraocular Movements: Extraocular movements  intact.     Conjunctiva/sclera: Conjunctivae normal.     Pupils: Pupils are equal, round, and reactive to light.  Cardiovascular:     Rate and Rhythm: Normal rate and regular rhythm.     Pulses: Normal pulses.     Heart sounds: Normal heart sounds.  Pulmonary:     Effort: Pulmonary effort is normal.     Breath sounds: Normal breath sounds. No wheezing, rhonchi or rales.  Musculoskeletal:        General: Normal range of motion.     Cervical back: Normal range of motion and neck supple.  Skin:    General: Skin is warm and dry.     Comments: Bleeding from scalp with no laceration/avulsion visualized, head injury wrapped with Kerlix prior to discharge today  Neurological:     General: No focal deficit present.     Mental Status: He is alert and oriented to person, place, and time. Mental status is at baseline.     Cranial Nerves: No cranial nerve deficit.  Psychiatric:        Mood and Affect: Mood normal.        Behavior: Behavior normal.      UC Treatments / Results  Labs (all labs ordered are listed, but only abnormal results are displayed) Labs Reviewed - No data to display  EKG   Radiology No results found.  Procedures Procedures (including critical care time)  Medications Ordered in UC Medications - No data to display  Initial Impression / Assessment and Plan / UC Course  I have reviewed the triage vital signs and the nursing notes.  Pertinent labs & imaging results that were available during my care of the patient were reviewed by me and considered in my medical decision making (see chart for details).     MDM: 1.  Head injury initial encounter-Advised patient/wife to go to [facility] ED now for further evaluation to include imaging of head to rule out subdural hematoma and/or cranial bleed.  Patient or wife agreed and verbalized understanding of these instructions and this plan of care today.  Patient discharged to ED, hemodynamically stable. Final Clinical Impressions(s) / UC Diagnoses   Final diagnoses:  Head injury, initial encounter     Discharge Instructions      Advised patient/wife to go to [facility] ED now for further evaluation to include imaging of head to rule out subdural hematoma and/or cranial bleed.     ED Prescriptions   None    PDMP not reviewed this encounter.   Kenaniah, Bussard, FNP 06/26/23 1406

## 2023-06-26 NOTE — ED Provider Notes (Signed)
[place] [facility] Provider Note   CSN: [id number] Arrival date & time: 06/26/23  1410     History  Chief Complaint  Patient presents with   Head Injury    Victor Price is a 72 y.o. male with past medical history significant for hypertension, BPH, GERD, cancer, CKD presents to the ED from urgent care due to head injury.  Patient reports a trailer hitch fell from approximately 10-15 ft off of a shelf and struck his head.  Patient states "he doubts he lost consciousness".  He does not take blood thinners.  Urgent care sent him for imaging.  Patient states his wound "stings a little" but he is not having a headache or severe pain.  Denies neck pain, dizziness, light-headedness, weakness, visual disturbance, nausea, vomiting.        Home Medications Prior to Admission medications   Medication Sig Start Date End Date Taking? Authorizing Provider  amoxicillin-clavulanate (AUGMENTIN) 875-125 MG tablet Take 1 tablet by mouth 2 (two) times daily. 05/09/23   Agapito Games, MD  atorvastatin (LIPITOR) 40 MG tablet TAKE 1 TABLET BY MOUTH EVERYDAY AT BEDTIME 03/31/23   Agapito Games, MD  bisoprolol-hydrochlorothiazide [facility] 10-6.25 MG tablet TAKE 1 TABLET BY MOUTH EVERY DAY 04/22/23   Agapito Games, MD  CLARAVIS 40 MG capsule Take 40 mg by mouth 2 (two) times daily. 02/26/22   [provider]  [facility] ALLERGY RELIEF-D 10-240 MG 24 hr tablet TAKE 1 TABLET BY MOUTH EVERY DAY 08/23/22   Agapito Games, MD  FARXIGA 10 MG TABS tablet TAKE 1 TABLET BY MOUTH EVERY DAY 04/22/23   Agapito Games, MD  Lidocaine-Hydrocortisone Ace (ANAMANTLE HC) 3-0.5 % KIT Apply 1 dose rectally up to  Bid prn for spasm. 11/04/22   Agapito Games, MD  meclizine (ANTIVERT) 25 MG tablet Take 1 tablet (25 mg total) by mouth 3 (three) times daily as needed for dizziness. 02/18/22   Agapito Games, MD  methimazole (TAPAZOLE) 10 MG tablet  Take 1 tablet (10 mg total) by mouth 2 (two) times daily. 05/11/23   Agapito Games, MD  Multiple Vitamin (MULTIVITAMIN) tablet Take 1 tablet by mouth daily.    [provider]  mupirocin ointment (BACTROBAN) 2 % Apply topically 3 (three) times daily. 09/11/20   Agapito Games, MD  sildenafil (REVATIO) 20 MG tablet TAKE 2 TO 5 TABLETS BY MOUTH DAILY AS NEEDED 12/17/22   Agapito Games, MD  sodium chloride HYPERTONIC 3 % nebulizer solution Inhale 4mL via nebulizer up to 2 times daily as needed to loosen congestion 10/21/22   Glenford Bayley, NP  tacrolimus (PROTOPIC) 0.1 % ointment Apply 1 application. topically 2 (two) times daily. 11/17/21   [provider]  tamsulosin (FLOMAX) 0.4 MG CAPS capsule TAKE 1 CAPSULE (0.4 MG TOTAL) BY MOUTH IN THE MORNING AND AT BEDTIME 11/01/22   Agapito Games, MD  Testosterone 10 MG/ACT (2%) GEL APPLY 1 PUMP TO AN INNER THIGH AND 2 PUMPS TO OPPOSITE INNER THIGH IN AM. FORTESTA. 11/04/22   Agapito Games, MD  traZODone (DESYREL) 50 MG tablet TAKE 1/2 TO 2 TABLETS BY MOUTH AT BEDTIME AS NEEDED SLEEP 03/07/23   Agapito Games, MD  triamcinolone cream (KENALOG) 0.1 % Apply topically 2 (two) times daily. 10/19/21   [provider]  umeclidinium-vilanterol (ANORO ELLIPTA) 62.5-25 MCG/ACT AEPB Inhale 1 puff into the lungs daily. 11/04/22   Agapito Games,  MD      Allergies    Losartan and Metoprolol    Review of Systems   Review of Systems  Eyes:  Negative for visual disturbance.  Gastrointestinal:  Negative for nausea and vomiting.  Neurological:  Negative for dizziness, syncope, weakness, light-headedness and headaches.    Physical Exam Updated Vital Signs BP (!) 154/90 (BP Location: Right Arm)   Pulse 85   Temp 97.8 F (36.6 C) (Oral)   Resp 18   Ht 6' (1.829 m)   Wt 72.6 kg   SpO2 99%   BMI 21.71 kg/m  Physical Exam Vitals and nursing note reviewed.  Constitutional:      General: He  is not in acute distress.    Appearance: Normal appearance. He is not ill-appearing or diaphoretic.  HENT:     Head: Normocephalic. Contusion (center, top of head) and laceration present. No raccoon eyes.     Jaw: There is normal jaw occlusion.  Eyes:     General: Vision grossly intact.     Extraocular Movements: Extraocular movements intact.     Pupils: Pupils are equal, round, and reactive to light.  Cardiovascular:     Rate and Rhythm: Normal rate and regular rhythm.  Pulmonary:     Effort: Pulmonary effort is normal.  Musculoskeletal:     Cervical back: Full passive range of motion without pain. No signs of trauma. No pain with movement, spinous process tenderness or muscular tenderness. Normal range of motion.  Skin:    General: Skin is warm and dry.     Capillary Refill: Capillary refill takes less than 2 seconds.  Neurological:     Mental Status: He is alert. Mental status is at baseline.  Psychiatric:        Mood and Affect: Mood normal.        Behavior: Behavior normal.     ED Results / Procedures / Treatments   Labs (all labs ordered are listed, but only abnormal results are displayed) Labs Reviewed - No data to display  EKG None  Radiology CT Head Wo Contrast  Result Date: 06/26/2023 CLINICAL DATA:  Head trauma, minor (Age >= 65y). Trailer hitch fell onto head. EXAM: CT HEAD WITHOUT CONTRAST TECHNIQUE: Contiguous axial images were obtained from the base of the skull through the vertex without intravenous contrast. RADIATION DOSE REDUCTION: This exam was performed according to the departmental dose-optimization program which includes automated exposure control, adjustment of the mA and/or kV according to patient size and/or use of iterative reconstruction technique. COMPARISON:  None Available. FINDINGS: Brain: There is no evidence of an acute infarct, intracranial hemorrhage, mass, midline shift, or extra-axial fluid collection. There is mild cerebral atrophy.  Cerebral volume is normal. Vascular: No hyperdense vessel. Skull: No acute fracture or suspicious osseous lesion. Sinuses/Orbits: Visualized paranasal sinuses and mastoid air cells are clear. Unremarkable orbits. Other:  Mild left frontal scalp swelling. IMPRESSION: No evidence of acute intracranial abnormality. Electronically Signed   By: Sebastian Ache M.D.   On: 06/26/2023 14:57    Procedures .Marland KitchenLaceration Repair  Date/Time: 06/26/2023 3:55 PM  Performed by: Lenard Simmer, PA-C Authorized by: Lenard Simmer, PA-C   Consent:    Consent obtained:  Verbal   Consent given by:  Patient   Risks, benefits, and alternatives were discussed: yes     Risks discussed:  Infection, pain, need for additional repair, nerve damage, poor cosmetic result and poor wound healing   Alternatives discussed:  No treatment and observation  Universal protocol:    Procedure explained and questions answered to patient or proxy's satisfaction: yes     Imaging studies available: yes     Patient identity confirmed:  Verbally with patient Anesthesia:    Anesthesia method:  None Laceration details:    Location:  Scalp   Scalp location:  Crown   Length (cm):  1.5 Pre-procedure details:    Preparation:  Imaging obtained to evaluate for foreign bodies Exploration:    Limited defect created (wound extended): no     Hemostasis achieved with:  Direct pressure   Imaging obtained: x-ray     Imaging outcome: foreign body not noted     Wound exploration: wound explored through full range of motion and entire depth of wound visualized   Treatment:    Area cleansed with:  Shur-Clens   Amount of cleaning:  Standard Skin repair:    Repair method:  Staples   Number of staples:  2 Approximation:    Approximation:  Close Repair type:    Repair type:  Simple Post-procedure details:    Dressing:  Antibiotic ointment   Procedure completion:  Tolerated well, no immediate complications     Medications Ordered in  ED Medications - No data to display  ED Course/ Medical Decision Making/ A&P                                 Medical Decision Making Amount and/or Complexity of Data Reviewed Radiology: ordered.   This patient presents to the ED with chief complaint(s) of head injury with pertinent past medical history of hypertension, hyperlipidemia.  The complaint involves an extensive differential diagnosis and also carries with it a high risk of complications and morbidity.    The differential diagnosis includes acute intracranial injury/hemorrhage, skull fracture   Initial Assessment:   Exam significant for overall well-appearing patient who is not in acute distress.  He is alert and oriented.  Patient moving all extremities appropriately.  Wound appreciated to the center, top of his head with oozing.  No brisk bleeding.  Small palpable hematoma.  No depressed skull fracture.  Normal ROM of the neck without significant pain.  No gross deformities or other injuries appreciated on exam.    Treatment and Reassessment: Patient's wound cleaned.  Approximately 1.5 cm laceration to the scalp which was repaired with staples.  See procedure section.    Disposition:   Discussed wound care with patient.  Advised that we will need his 2 staples removed in 7-10 days.  Patient also provided with information about concussion signs and symptoms.    The patient has been appropriately medically screened and/or stabilized in the ED. I have low suspicion for any other emergent medical condition which would require further screening, evaluation or treatment in the ED or require inpatient management. At time of discharge the patient is hemodynamically stable and in no acute distress. I have discussed work-up results and diagnosis with patient and answered all questions. Patient is agreeable with discharge plan. We discussed strict return precautions for returning to the emergency department and they verbalized understanding.               Final Clinical Impression(s) / ED Diagnoses Final diagnoses:  Injury of head, initial encounter  Laceration of scalp, initial encounter    Rx / DC Orders ED Discharge Orders     None         Naomy Esham,  Modena Morrow, PA-C 06/26/23 1559    Terrilee Files, MD 06/26/23 [phone]

## 2023-06-26 NOTE — ED Triage Notes (Signed)
[facility] with [facility] of head injury today. Pt reports he was in his stroage unit when he was head with a trailer hitch falling down onto his head. This occurred about 45 min ago pt applied pressure and was driven here.

## 2023-06-26 NOTE — ED Notes (Signed)
Pt refused bandaging. Gauze given to use to hold to wound if oozing begins again.

## 2023-06-26 NOTE — ED Notes (Signed)
Patient is being discharged from the Urgent Care and sent to the Emergency Department via pov with wife . Per ragan NP, patient is in need of higher level of care due to head laceration and need for imaging . Patient is aware and verbalizes understanding of plan of care.  Vitals:   06/26/23 1347  BP: 138/84  Pulse: 83  Resp: 16  Temp: 97.6 F (36.4 C)  SpO2: 98%

## 2023-06-26 NOTE — ED Triage Notes (Signed)
Pt reports a trailer hitch fell from about 10-24ft off a shelf onto his head today; thinks he might have had LOC "for 1 second"; no blood thinners; sent here from [facility]

## 2023-06-26 NOTE — Discharge Instructions (Addendum)
Thank you for allowing [name] to be a part of your care today.  You were evaluated in the ED for a head injury.  Your CT scan was negative for brain injury or skull fracture.   You had a small laceration to your scalp.  This was repaired with 2 staples.  You will need to have these removed in 7-10 days.  This can be done in an urgent care or ED.   You may shower normally tomorrow morning.  Cleanse the area gently, do not scrub.  Always pat dry.   I have attached information about concussion signs and symptoms.  If you develop these, you should be re-evaluated by your primary care doctor.   Return to the ED if you develop sudden worsening of your symptoms or if you have new concerns.

## 2023-06-26 NOTE — ED Notes (Signed)
ED Provider at bedside. 

## 2023-06-26 NOTE — Discharge Instructions (Addendum)
Advised patient/wife to go to [facility] ED now for further evaluation to include imaging of head to rule out subdural hematoma and/or cranial bleed.

## 2023-06-28 DIAGNOSIS — E0590 Thyrotoxicosis, unspecified without thyrotoxic crisis or storm: Secondary | ICD-10-CM | POA: Diagnosis not present

## 2023-07-05 ENCOUNTER — Encounter: Payer: Self-pay | Admitting: Physician Assistant

## 2023-07-05 ENCOUNTER — Ambulatory Visit (INDEPENDENT_AMBULATORY_CARE_PROVIDER_SITE_OTHER): Payer: Medicare HMO | Admitting: Physician Assistant

## 2023-07-05 VITALS — BP 124/64 | HR 77 | Temp 98.4°F | Resp 20 | Ht 72.0 in | Wt 153.9 lb

## 2023-07-05 DIAGNOSIS — Z4802 Encounter for removal of sutures: Secondary | ICD-10-CM | POA: Diagnosis not present

## 2023-07-05 DIAGNOSIS — S0101XD Laceration without foreign body of scalp, subsequent encounter: Secondary | ICD-10-CM | POA: Diagnosis not present

## 2023-07-05 NOTE — Progress Notes (Signed)
   Established Patient Office Visit  Subjective   Patient ID: KEIGAN SALDARRIAGA, male    DOB: [birth year]  Age: 72 y.o. MRN: [id number]  Chief Complaint  Patient presents with   Suture / Staple Removal    Suture / Staple Removal   72 yo male presents today for staple removal. He endured a heady injury from a falling trailer hitch on 06/26/23 and went to the ED. No other symptoms at that time besides slight stinging. He received 2 staples. Today, he is feeling well and has no complaints.   Review of Systems  Musculoskeletal:  Negative for falls.  Neurological:  Negative for loss of consciousness and headaches.      Objective:     BP 124/64   Pulse 77   Temp 98.4 F (36.9 C)   Resp 20   Ht 6' (1.829 m)   Wt 153 lb 14.4 oz (69.8 kg)   SpO2 97%   BMI 20.87 kg/m    Physical Exam HENT:     Head:     Comments: 2 staples with scabbing present on dorsal parietal region approximately 2cm. No bleeding, exudate, or swelling present.   Laceration cleaned with alcohol wipe and 2 staples removed today in office without complication.   Assessment & Plan:  Marland KitchenMarland KitchenKimble was seen today for suture / staple removal.  Diagnoses and all orders for this visit:  Encounter for staple removal  Laceration of scalp, subsequent encounter   2 staples were removed today. Advised patient that he can clean and shampoo his head, but to not pick scabs off. Provided antibiotic ointment in office and for patient to take home. HO given.     Tandy Gaw, PA-C

## 2023-07-05 NOTE — Patient Instructions (Signed)
Wound Closure Removal, Care After The following information offers guidance on how to care for yourself after your stitches (sutures), staples, or adhesive strips have been removed. Your health care provider may also give you more specific instructions. If you have problems or questions, contact your health care provider. What can I expect after the procedure? After your sutures or staples have been removed or your adhesive strips have fallen off, it is common to have: Some discomfort and swelling in the area. Slight redness in the area. Follow these instructions at home: If you have a dressing: Wash your hands with soap and water for at least 20 seconds before and after you change your bandage (dressing). If soap and water are not available, use hand sanitizer. Change your dressing as told by your health care provider. If your dressing becomes wet or dirty, or develops a bad smell, change it as soon as possible. If your dressing sticks to your skin, pour warm, clean water over it until it loosens and can be removed without pulling apart the wound edges. Pat the area dry with a soft, clean towel. Do not rub the wound because that may cause bleeding. Wound care  Check your wound every day for signs of infection. Check for: More redness, swelling, or pain. Fluid or blood. New warmth, a rash, or hardness at the wound site. Pus or a bad smell. Wash your hands with soap and water for at least 20 seconds before and after touching your wound. If soap and water are not available, use hand sanitizer. Keep the wound area dry and clean. Clean and pat the wound dry as told by your health care provider. Apply cream or ointment only as told by your health care provider. If skin glue or adhesive strips were applied after sutures or staples were removed, leave these closures in place until they peel off on their own. If adhesive strip edges start to loosen and curl up, you may trim the loose edges. Do not  remove adhesive strips completely unless your health care provider tells you to do that. Continue to protect the wound from injury. Do not pick at your wound. Picking can cause an infection. Bathing Do not take baths, swim, or use a hot tub until your health care provider approves. Ask your health care provider if you may take showers. Follow these steps for showering: If you have a dressing, remove it before getting into the shower. In the shower, allow soapy water to get on the wound. Avoid scrubbing the wound. When you get out of the shower, dry the wound by patting it with a clean towel. Reapply a dressing over the wound, if needed. Scar care When your wound has completely healed, help decrease the size of your scar by: Wearing sunscreen over the scar or covering it with clothing when you are outside. New scars get sunburned easily, which can make scarring worse. Gently massaging the scarred area. This can decrease scar thickness. General instructions Take over-the-counter and prescription medicines only as told by your health care provider. Keep all follow-up visits. This is important. Contact a health care provider if: You have more redness, swelling, or pain around your wound. You have fluid or blood coming from your wound. You have new warmth, a rash, or hardness at the wound site. You have pus or a bad smell coming from your wound. Your wound opens up. Get help right away if: You have a fever or chills. You have red streaks coming from  your wound. Summary Change your dressing as told by your health care provider. If your dressing becomes wet or dirty, or develops a bad smell, change it as soon as possible. Check your wound every day for signs of infection. Wash your hands with soap and water for 20 seconds before and after touching your wound. This information is not intended to replace advice given to you by your health care provider. Make sure you discuss any questions you  have with your health care provider. Document Revised: 11/18/2020 Document Reviewed: 11/18/2020 Elsevier Patient Education  2024 [employer].

## 2023-07-11 DIAGNOSIS — H903 Sensorineural hearing loss, bilateral: Secondary | ICD-10-CM | POA: Diagnosis not present

## 2023-07-13 ENCOUNTER — Other Ambulatory Visit: Payer: Self-pay | Admitting: Family Medicine

## 2023-07-13 DIAGNOSIS — N529 Male erectile dysfunction, unspecified: Secondary | ICD-10-CM

## 2023-07-21 ENCOUNTER — Other Ambulatory Visit: Payer: Self-pay | Admitting: *Deleted

## 2023-07-27 ENCOUNTER — Other Ambulatory Visit: Payer: Self-pay | Admitting: Family Medicine

## 2023-07-28 ENCOUNTER — Other Ambulatory Visit: Payer: Self-pay

## 2023-07-28 ENCOUNTER — Ambulatory Visit: Payer: Medicare HMO | Admitting: Orthopaedic Surgery

## 2023-07-28 ENCOUNTER — Encounter: Payer: Self-pay | Admitting: Orthopaedic Surgery

## 2023-07-28 VITALS — Ht 72.0 in | Wt 160.0 lb

## 2023-07-28 DIAGNOSIS — M25551 Pain in right hip: Secondary | ICD-10-CM | POA: Diagnosis not present

## 2023-07-28 NOTE — Progress Notes (Signed)
[facility]  Office Visit Note   Patient: Victor Price           Date of Birth: 1951-05-23           MRN: [id number] Visit Date: 07/28/2023              Requested by: Agapito Games, MD [address] [place],  [place] [address] PCP: Agapito Games, MD   Assessment & Plan: Visit Diagnoses:  1. Pain in right hip     Plan: Patient with previous left total hip arthroplasty 08/24/2019 now with severe right hip pain.  X-ray showed joint space narrowing but he does not have severe hip osteoarthritic changes that would be expected with his significant pain and pronounced limp.  Will proceed with MRI scan states having difficulty walking and his hip is catching and grabbing.  We discussed using a cane or his walker to prevent falling.  He rates his pain is 8 or 9 out of 10 with ambulation.  Follow-Up Instructions: No follow-ups on file.   Orders:  Orders Placed This Encounter  Procedures   XR HIP UNILAT W OR W/O PELVIS 2-3 VIEWS RIGHT   MR Hip Right w/o contrast   No orders of the defined types were placed in this encounter.     Procedures: No procedures performed   Clinical Data: No additional findings.   Subjective: Chief Complaint  Patient presents with   Right Hip - Pain, Follow-up   Lower Back - Pain, Follow-up    HPI 72 year old male returns with severe right hip pain.  Previous left total hip arthroplasty now almost 4 years ago January 2021.  Left hip doing well right hip over the last 6 months has become terribly painful he is having trouble walking and has severe pain since he stands up.  He points to the groin that radiates halfway down anteriorly into the thigh no numbness or tingling in his feet no associated back pain.  Patient is here today and states he is ready for right total hip arthroplasty.  Hernia repair December 2023 but was on the left side opposite from his severe right groin pain.  Review of Systems positive history of  hypertension BP PMH carpal tunnel.  Previous left total hip arthroplasty.  All other systems noncontributory to HPI.   Objective: Vital Signs: Ht 6' (1.829 m)   Wt 160 lb (72.6 kg)   BMI 21.70 kg/m   Physical Exam Constitutional:      Appearance: He is well-developed.  HENT:     Head: Normocephalic and atraumatic.     Right Ear: External ear normal.     Left Ear: External ear normal.  Eyes:     Pupils: Pupils are equal, round, and reactive to light.  Neck:     Thyroid: No thyromegaly.     Trachea: No tracheal deviation.  Cardiovascular:     Rate and Rhythm: Normal rate.  Pulmonary:     Effort: Pulmonary effort is normal.     Breath sounds: No wheezing.  Abdominal:     General: Bowel sounds are normal.     Palpations: Abdomen is soft.  Musculoskeletal:     Cervical back: Neck supple.  Skin:    General: Skin is warm and dry.     Capillary Refill: Capillary refill takes less than 2 seconds.  Neurological:     Mental Status: He is alert and oriented to person, place, and time.  Psychiatric:  Behavior: Behavior normal.        Thought Content: Thought content normal.        Judgment: Judgment normal.     Ortho Exam patient is 10 degrees internal rotation right hip with severe pain Trendelenburg gait with severe limp.  Severe pain with hip flexion past 90 degrees.  Opposite left hip has normal internal/external rotation.  Severe limp Trendelenburg gait with facial grimace with weightbearing.  Specialty Comments:  No specialty comments available.  Imaging: AP pelvis frog-leg right hip demonstrates joint space narrowing weightbearing surface.  No osteophyte formation no evidence of avascular necrosis.  There is 50%   Impression: Progressive joint space narrowing comparison to 04/19/2023 images.   PMFS History: Patient Active Problem List   Diagnosis Date Noted   Pain in right hip 04/19/2023   IFG (impaired fasting glucose) 02/01/2023   Hemorrhoid 11/04/2022    Cystic acne 11/04/2022   Left inguinal hernia 01/27/2022   Carpal tunnel syndrome, left upper limb 12/04/2019   H/O total hip arthroplasty, left 10/19/2019   DDD (degenerative disc disease), lumbar 06/05/2019   Hypothyroidism 10/31/2017   Hypogonadism in male 10/31/2017   CKD (chronic kidney disease) stage 3, GFR 30-59 ml/min (HCC) 02/08/2017   Bronchiectasis (HCC) 07/20/2016   Radiculitis of right cervical region 08/28/2014   DDD (degenerative disc disease), cervical 08/28/2014   Insomnia 04/30/2014   Peripheral neuropathy 04/02/2013   Herniated disc 01/29/2013   BPH (benign prostatic hyperplasia) 12/14/2011   ERECTILE DYSFUNCTION, ORGANIC 07/23/2010   THYROID NODULE, LEFT 05/23/2007   Hyperlipidemia 05/17/2006   HYPERTENSION, BENIGN SYSTEMIC 05/17/2006   Past Medical History:  Diagnosis Date   BPH (benign prostatic hyperplasia)    Bronchiectasis with acute lower respiratory infection (HCC) 08/13/2019   Bronchitis    inhaler prn   Cancer (HCC)    skin cancer?   ED (erectile dysfunction)    GERD (gastroesophageal reflux disease)    HLD (hyperlipidemia)    Hypertension    Pneumonia    gets recurrent PNA in left lung, recent dx 08/13/19 left lower lobe   Seasonal allergies     Family History  Problem Relation Age of Onset   Dementia Mother    Healthy Father     Past Surgical History:  Procedure Laterality Date   BACK SURGERY  1980s   [facility]   COLONOSCOPY  2004, 2012   several - polyps   MULTIPLE TOOTH EXTRACTIONS     TOTAL HIP ARTHROPLASTY Left 08/24/2019   Procedure: LEFT TOTAL HIP ARTHROPLASTY-DIRECT ANTERIOR;  Surgeon: Eldred Manges, MD;  Location: [facility] OR;  Service: Orthopedics;  Laterality: Left;   Social History   Occupational History   Occupation: [employer]    Employer: [employer]    Comment: Retired    Comment: Part-time  Tobacco Use   Smoking status: Never   Smokeless tobacco: Never  Vaping Use   Vaping status: Never Used  Substance and Sexual  Activity   Alcohol use: No   Drug use: No   Sexual activity: Not on file

## 2023-07-28 NOTE — H&P (View-Only) (Signed)
 [facility]  Office Visit Note   Patient: Victor Price           Date of Birth: 1951-05-23           MRN: [id number] Visit Date: 07/28/2023              Requested by: Agapito Games, MD [address] [place],  [place] [address] PCP: Agapito Games, MD   Assessment & Plan: Visit Diagnoses:  1. Pain in right hip     Plan: Patient with previous left total hip arthroplasty 08/24/2019 now with severe right hip pain.  X-ray showed joint space narrowing but he does not have severe hip osteoarthritic changes that would be expected with his significant pain and pronounced limp.  Will proceed with MRI scan states having difficulty walking and his hip is catching and grabbing.  We discussed using a cane or his walker to prevent falling.  He rates his pain is 8 or 9 out of 10 with ambulation.  Follow-Up Instructions: No follow-ups on file.   Orders:  Orders Placed This Encounter  Procedures   XR HIP UNILAT W OR W/O PELVIS 2-3 VIEWS RIGHT   MR Hip Right w/o contrast   No orders of the defined types were placed in this encounter.     Procedures: No procedures performed   Clinical Data: No additional findings.   Subjective: Chief Complaint  Patient presents with   Right Hip - Pain, Follow-up   Lower Back - Pain, Follow-up    HPI 72 year old male returns with severe right hip pain.  Previous left total hip arthroplasty now almost 4 years ago January 2021.  Left hip doing well right hip over the last 6 months has become terribly painful he is having trouble walking and has severe pain since he stands up.  He points to the groin that radiates halfway down anteriorly into the thigh no numbness or tingling in his feet no associated back pain.  Patient is here today and states he is ready for right total hip arthroplasty.  Hernia repair December 2023 but was on the left side opposite from his severe right groin pain.  Review of Systems positive history of  hypertension BP PMH carpal tunnel.  Previous left total hip arthroplasty.  All other systems noncontributory to HPI.   Objective: Vital Signs: Ht 6' (1.829 m)   Wt 160 lb (72.6 kg)   BMI 21.70 kg/m   Physical Exam Constitutional:      Appearance: He is well-developed.  HENT:     Head: Normocephalic and atraumatic.     Right Ear: External ear normal.     Left Ear: External ear normal.  Eyes:     Pupils: Pupils are equal, round, and reactive to light.  Neck:     Thyroid: No thyromegaly.     Trachea: No tracheal deviation.  Cardiovascular:     Rate and Rhythm: Normal rate.  Pulmonary:     Effort: Pulmonary effort is normal.     Breath sounds: No wheezing.  Abdominal:     General: Bowel sounds are normal.     Palpations: Abdomen is soft.  Musculoskeletal:     Cervical back: Neck supple.  Skin:    General: Skin is warm and dry.     Capillary Refill: Capillary refill takes less than 2 seconds.  Neurological:     Mental Status: He is alert and oriented to person, place, and time.  Psychiatric:  Behavior: Behavior normal.        Thought Content: Thought content normal.        Judgment: Judgment normal.     Ortho Exam patient is 10 degrees internal rotation right hip with severe pain Trendelenburg gait with severe limp.  Severe pain with hip flexion past 90 degrees.  Opposite left hip has normal internal/external rotation.  Severe limp Trendelenburg gait with facial grimace with weightbearing.  Specialty Comments:  No specialty comments available.  Imaging: AP pelvis frog-leg right hip demonstrates joint space narrowing weightbearing surface.  No osteophyte formation no evidence of avascular necrosis.  There is 50%   Impression: Progressive joint space narrowing comparison to 04/19/2023 images.   PMFS History: Patient Active Problem List   Diagnosis Date Noted   Pain in right hip 04/19/2023   IFG (impaired fasting glucose) 02/01/2023   Hemorrhoid 11/04/2022    Cystic acne 11/04/2022   Left inguinal hernia 01/27/2022   Carpal tunnel syndrome, left upper limb 12/04/2019   H/O total hip arthroplasty, left 10/19/2019   DDD (degenerative disc disease), lumbar 06/05/2019   Hypothyroidism 10/31/2017   Hypogonadism in male 10/31/2017   CKD (chronic kidney disease) stage 3, GFR 30-59 ml/min (HCC) 02/08/2017   Bronchiectasis (HCC) 07/20/2016   Radiculitis of right cervical region 08/28/2014   DDD (degenerative disc disease), cervical 08/28/2014   Insomnia 04/30/2014   Peripheral neuropathy 04/02/2013   Herniated disc 01/29/2013   BPH (benign prostatic hyperplasia) 12/14/2011   ERECTILE DYSFUNCTION, ORGANIC 07/23/2010   THYROID NODULE, LEFT 05/23/2007   Hyperlipidemia 05/17/2006   HYPERTENSION, BENIGN SYSTEMIC 05/17/2006   Past Medical History:  Diagnosis Date   BPH (benign prostatic hyperplasia)    Bronchiectasis with acute lower respiratory infection (HCC) 08/13/2019   Bronchitis    inhaler prn   Cancer (HCC)    skin cancer?   ED (erectile dysfunction)    GERD (gastroesophageal reflux disease)    HLD (hyperlipidemia)    Hypertension    Pneumonia    gets recurrent PNA in left lung, recent dx 08/13/19 left lower lobe   Seasonal allergies     Family History  Problem Relation Age of Onset   Dementia Mother    Healthy Father     Past Surgical History:  Procedure Laterality Date   BACK SURGERY  1980s   [facility]   COLONOSCOPY  2004, 2012   several - polyps   MULTIPLE TOOTH EXTRACTIONS     TOTAL HIP ARTHROPLASTY Left 08/24/2019   Procedure: LEFT TOTAL HIP ARTHROPLASTY-DIRECT ANTERIOR;  Surgeon: Eldred Manges, MD;  Location: [facility] OR;  Service: Orthopedics;  Laterality: Left;   Social History   Occupational History   Occupation: [employer]    Employer: [employer]    Comment: Retired    Comment: Part-time  Tobacco Use   Smoking status: Never   Smokeless tobacco: Never  Vaping Use   Vaping status: Never Used  Substance and Sexual  Activity   Alcohol use: No   Drug use: No   Sexual activity: Not on file

## 2023-08-06 ENCOUNTER — Ambulatory Visit: Payer: Medicare HMO

## 2023-08-06 DIAGNOSIS — S76311A Strain of muscle, fascia and tendon of the posterior muscle group at thigh level, right thigh, initial encounter: Secondary | ICD-10-CM | POA: Diagnosis not present

## 2023-08-06 DIAGNOSIS — G8929 Other chronic pain: Secondary | ICD-10-CM

## 2023-08-06 DIAGNOSIS — M25551 Pain in right hip: Secondary | ICD-10-CM | POA: Diagnosis not present

## 2023-08-06 DIAGNOSIS — R609 Edema, unspecified: Secondary | ICD-10-CM | POA: Diagnosis not present

## 2023-08-06 DIAGNOSIS — Z471 Aftercare following joint replacement surgery: Secondary | ICD-10-CM | POA: Diagnosis not present

## 2023-08-07 ENCOUNTER — Other Ambulatory Visit: Payer: Self-pay | Admitting: Family Medicine

## 2023-08-07 DIAGNOSIS — E0590 Thyrotoxicosis, unspecified without thyrotoxic crisis or storm: Secondary | ICD-10-CM

## 2023-08-19 ENCOUNTER — Other Ambulatory Visit: Payer: Self-pay

## 2023-08-19 ENCOUNTER — Ambulatory Visit: Payer: Medicare HMO | Admitting: Orthopaedic Surgery

## 2023-08-23 ENCOUNTER — Encounter (HOSPITAL_COMMUNITY): Payer: Self-pay

## 2023-08-23 ENCOUNTER — Other Ambulatory Visit: Payer: Self-pay | Admitting: Physician Assistant

## 2023-08-23 NOTE — Pre-Procedure Instructions (Signed)
 Surgical Instructions   Your procedure is scheduled on Friday, January 17th. Report to [facility] Main Entrance "A" at 05:30 A.M., then check in with the Admitting office. Any questions or running late day of surgery: call [phone]  Questions prior to your surgery date: call [phone], Monday-Friday, 8am-4pm. If you experience any cold or flu symptoms such as cough, fever, chills, shortness of breath, etc. between now and your scheduled surgery, please notify us  at the above number.     Remember:  Do not eat after midnight the night before your surgery  You may drink clear liquids until 04:30 AM the morning of your surgery.   Clear liquids allowed are: Water, Non-Citrus Juices (without pulp), Carbonated Beverages, Clear Tea (no milk, honey, etc.), Black Coffee Only (NO MILK, CREAM OR POWDERED CREAMER of any kind), and Gatorade.  Patient Instructions  The night before surgery:  No food after midnight. ONLY clear liquids after midnight  The day of surgery (if you do NOT have diabetes):  Drink ONE (1) Pre-Surgery Clear Ensure by 04:30 AM the morning of surgery. Drink in one sitting. Do not sip.  This drink was given to you during your hospital  pre-op appointment visit.  Nothing else to drink after completing the  Pre-Surgery Clear Ensure.          If you have questions, please contact your surgeon's office.    Take these medicines the morning of surgery with A SIP OF WATER  amoxicillin -clavulanate (AUGMENTIN )  methimazole  (TAPAZOLE )  tamsulosin  (FLOMAX )  umeclidinium-vilanterol (ANORO ELLIPTA )    May take these medicines IF NEEDED: meclizine  (ANTIVERT )     Hold Farxiga  for 72 hours prior to surgery. Last dose 1/13.  One week prior to surgery, STOP taking any Aspirin  (unless otherwise instructed by your surgeon) Aleve, Naproxen, Ibuprofen, Motrin, Advil, Goody's, BC's, all herbal medications, fish oil, and non-prescription vitamins.                     Do NOT  Smoke (Tobacco/Vaping) for 24 hours prior to your procedure.  If you use a CPAP at night, you may bring your mask/headgear for your overnight stay.   You will be asked to remove any contacts, glasses, piercing's, hearing aid's, dentures/partials prior to surgery. Please bring cases for these items if needed.    Patients discharged the day of surgery will not be allowed to drive home, and someone needs to stay with them for 24 hours.  SURGICAL WAITING ROOM VISITATION Patients may have no more than 2 support people in the waiting area - these visitors may rotate.   Pre-op nurse will coordinate an appropriate time for 1 ADULT support person, who may not rotate, to accompany patient in pre-op.  Children under the age of 45 must have an adult with them who is not the patient and must remain in the main waiting area with an adult.  If the patient needs to stay at the hospital during part of their recovery, the visitor guidelines for inpatient rooms apply.  Please refer to the [facility] website for the visitor guidelines for any additional information.   If you received a COVID test during your pre-op visit  it is requested that you wear a mask when out in public, stay away from anyone that may not be feeling well and notify your surgeon if you develop symptoms. If you have been in contact with anyone that has tested positive in the last 10 days please notify you surgeon.  Pre-operative 5 CHG Bathing Instructions   You can play a key role in reducing the risk of infection after surgery. Your skin needs to be as free of germs as possible. You can reduce the number of germs on your skin by washing with CHG (chlorhexidine  gluconate) soap before surgery. CHG is an antiseptic soap that kills germs and continues to kill germs even after washing.   DO NOT use if you have an allergy  to chlorhexidine /CHG or antibacterial soaps. If your skin becomes reddened or irritated, stop using the CHG and  notify one of our RNs at [phone].   Please shower with the CHG soap starting 4 days before surgery using the following schedule:     Please keep in mind the following:  DO NOT shave, including legs and underarms, starting the day of your first shower.   You may shave your face at any point before/day of surgery.  Place clean sheets on your bed the day you start using CHG soap. Use a clean washcloth (not used since being washed) for each shower. DO NOT sleep with pets once you start using the CHG.   CHG Shower Instructions:  Wash your face and private area with normal soap. If you choose to wash your hair, wash first with your normal shampoo.  After you use shampoo/soap, rinse your hair and body thoroughly to remove shampoo/soap residue.  Turn the water OFF and apply about 3 tablespoons (45 ml) of CHG soap to a CLEAN washcloth.  Apply CHG soap ONLY FROM YOUR NECK DOWN TO YOUR TOES (washing for 3-5 minutes)  DO NOT use CHG soap on face, private areas, open wounds, or sores.  Pay special attention to the area where your surgery is being performed.  If you are having back surgery, having someone wash your back for you may be helpful. Wait 2 minutes after CHG soap is applied, then you may rinse off the CHG soap.  Pat dry with a clean towel  Put on clean clothes/pajamas   If you choose to wear lotion, please use ONLY the CHG-compatible lotions that are listed below.  Additional instructions for the day of surgery: DO NOT APPLY any lotions, deodorants, cologne, or perfumes.   Do not bring valuables to the hospital. [facility] is not responsible for any belongings/valuables. Do not wear nail polish, gel polish, artificial nails, or any other type of covering on natural nails (fingers and toes) Do not wear jewelry or makeup Put on clean/comfortable clothes.  Please brush your teeth.  Ask your nurse before applying any prescription medications to the skin.     CHG Compatible  Lotions   Aveeno Moisturizing lotion  Cetaphil Moisturizing Cream  Cetaphil Moisturizing Lotion  Clairol Herbal Essence Moisturizing Lotion, Dry Skin  Clairol Herbal Essence Moisturizing Lotion, Extra Dry Skin  Clairol Herbal Essence Moisturizing Lotion, Normal Skin  Curel Age Defying Therapeutic Moisturizing Lotion with Alpha Hydroxy  Curel Extreme Care Body Lotion  Curel Soothing Hands Moisturizing Hand Lotion  Curel Therapeutic Moisturizing Cream, Fragrance-Free  Curel Therapeutic Moisturizing Lotion, Fragrance-Free  Curel Therapeutic Moisturizing Lotion, Original Formula  Eucerin Daily Replenishing Lotion  Eucerin Dry Skin Therapy Plus Alpha Hydroxy Crme  Eucerin Dry Skin Therapy Plus Alpha Hydroxy Lotion  Eucerin Original Crme  Eucerin Original Lotion  Eucerin Plus Crme Eucerin Plus Lotion  Eucerin TriLipid Replenishing Lotion  Keri Anti-Bacterial Hand Lotion  Keri Deep Conditioning Original Lotion Dry Skin Formula Softly Scented  Keri Deep Conditioning Original Lotion, Fragrance Free Sensitive  Skin Formula  Keri Lotion Fast Absorbing Fragrance Free Sensitive Skin Formula  Keri Lotion Fast Absorbing Softly Scented Dry Skin Formula  Keri Original Lotion  Keri Skin Renewal Lotion Keri Silky Smooth Lotion  Keri Silky Smooth Sensitive Skin Lotion  Nivea Body Creamy Conditioning Oil  Nivea Body Extra Enriched Lotion  Nivea Body Original Lotion  Nivea Body Sheer Moisturizing Lotion Nivea Crme  Nivea Skin Firming Lotion  NutraDerm 30 Skin Lotion  NutraDerm Skin Lotion  NutraDerm Therapeutic Skin Cream  NutraDerm Therapeutic Skin Lotion  ProShield Protective Hand Cream  Provon moisturizing lotion  Please read over the following fact sheets that you were given.

## 2023-08-24 ENCOUNTER — Encounter (HOSPITAL_COMMUNITY)
Admission: RE | Admit: 2023-08-24 | Discharge: 2023-08-24 | Disposition: A | Discharge status: Home / Self Care | Payer: Medicare HMO | Source: Ambulatory Visit | Attending: Orthopaedic Surgery | Admitting: Orthopaedic Surgery

## 2023-08-24 ENCOUNTER — Encounter (HOSPITAL_COMMUNITY): Payer: Self-pay

## 2023-08-24 ENCOUNTER — Other Ambulatory Visit: Payer: Self-pay

## 2023-08-24 VITALS — BP 145/85 | HR 90 | Temp 98.2°F | Resp 18 | Ht 72.0 in | Wt 156.2 lb

## 2023-08-24 DIAGNOSIS — Z01818 Encounter for other preprocedural examination: Secondary | ICD-10-CM | POA: Insufficient documentation

## 2023-08-24 HISTORY — DX: Nontoxic multinodular goiter: E04.2

## 2023-08-24 HISTORY — DX: Thyrotoxicosis, unspecified without thyrotoxic crisis or storm: E05.90

## 2023-08-24 HISTORY — DX: Chronic kidney disease, stage 3 unspecified: N18.30

## 2023-08-24 LAB — CBC
HCT: 41.8 % (ref 39.0–52.0)
Hemoglobin: 13.9 g/dL (ref 13.0–17.0)
MCH: 31.2 pg (ref 26.0–34.0)
MCHC: 33.3 g/dL (ref 30.0–36.0)
MCV: 93.9 fL (ref 80.0–100.0)
Platelets: 355 10*3/uL (ref 150–400)
RBC: 4.45 MIL/uL (ref 4.22–5.81)
RDW: 14.4 % (ref 11.5–15.5)
WBC: 8.5 10*3/uL (ref 4.0–10.5)
nRBC: 0 % (ref 0.0–0.2)

## 2023-08-24 LAB — BASIC METABOLIC PANEL
Anion gap: 7 (ref 5–15)
BUN: 27 mg/dL — ABNORMAL HIGH (ref 8–23)
CO2: 26 mmol/L (ref 22–32)
Calcium: 9.8 mg/dL (ref 8.9–10.3)
Chloride: 104 mmol/L (ref 98–111)
Creatinine, Ser: 1.7 mg/dL — ABNORMAL HIGH (ref 0.61–1.24)
GFR, Estimated: 43 mL/min — ABNORMAL LOW (ref >60)
Glucose, Bld: 109 mg/dL — ABNORMAL HIGH (ref 70–99)
Potassium: 3.8 mmol/L (ref 3.5–5.1)
Sodium: 137 mmol/L (ref 135–145)

## 2023-08-24 LAB — TYPE AND SCREEN
ABO/RH(D): O POS
Antibody Screen: NEGATIVE

## 2023-08-24 LAB — SURGICAL PCR SCREEN
MRSA, PCR: NEGATIVE
Staphylococcus aureus: NEGATIVE

## 2023-08-24 NOTE — Progress Notes (Signed)
 PCP - Dr. Duaine German Cardiologist - Denies  PPM/ICD - Denies Device Orders - n/a Rep Notified - n/a  Chest x-ray - n/a EKG - 08-24-23 Stress Test - denies ECHO - denies Cardiac Cath - denies  Sleep Study - denies CPAP - n/a  Non-diabetic  Blood Thinner Instructions: denies Aspirin  Instructions: Denies - instructed not to take any prior to surgery.  ERAS Protcol - Clears until 0430 PRE-SURGERY Ensure or G2- ensure was given  Anesthesia review: Y, new EKG, HTN, CKD stage 3  Patient denies shortness of breath, fever, cough and chest pain at PAT appointment. Patient refuses any respiratory issues at this time.    All instructions explained to the patient, with a verbal understanding of the material. Patient agrees to go over the instructions while at home for a better understanding. Patient also instructed to self quarantine after being tested for COVID-19. The opportunity to ask questions was provided.

## 2023-08-25 ENCOUNTER — Encounter (HOSPITAL_COMMUNITY): Payer: Self-pay

## 2023-08-25 ENCOUNTER — Telehealth: Payer: Self-pay | Admitting: *Deleted

## 2023-08-25 ENCOUNTER — Other Ambulatory Visit (INDEPENDENT_AMBULATORY_CARE_PROVIDER_SITE_OTHER): Payer: Medicare HMO | Admitting: Physician Assistant

## 2023-08-25 DIAGNOSIS — M1611 Unilateral primary osteoarthritis, right hip: Secondary | ICD-10-CM

## 2023-08-25 NOTE — Anesthesia Preprocedure Evaluation (Addendum)
Anesthesia Evaluation  Patient identified by MRN, date of birth, ID band Patient awake    Reviewed: Allergy & Precautions, NPO status , Patient's Chart, lab work & pertinent test results  Airway Mallampati: III  TM Distance: >3 FB Neck ROM: Full    Dental  (+) Teeth Intact, Dental Advisory Given   Pulmonary asthma    Pulmonary exam normal breath sounds clear to auscultation       Cardiovascular hypertension, Pt. on medications and Pt. on home beta blockers Normal cardiovascular exam Rhythm:Regular Rate:Normal     Neuro/Psych  Neuromuscular disease    GI/Hepatic Neg liver ROS,GERD  ,,  Endo/Other   Hyperthyroidism   Renal/GU Renal InsufficiencyRenal disease     Musculoskeletal  (+) Arthritis , Osteoarthritis,  right hip osteoarthritis    Abdominal   Peds  Hematology negative hematology ROS (+) Plt 355k   Anesthesia Other Findings   Reproductive/Obstetrics                             Anesthesia Physical Anesthesia Plan  ASA: 3  Anesthesia Plan: Spinal   Post-op Pain Management: Tylenol PO (pre-op)*   Induction: Intravenous  PONV Risk Score and Plan: 2 and Dexamethasone, Ondansetron and TIVA  Airway Management Planned: Natural Airway and Simple Face Mask  Additional Equipment:   Intra-op Plan:   Post-operative Plan:   Informed Consent: I have reviewed the patients History and Physical, chart, labs and discussed the procedure including the risks, benefits and alternatives for the proposed anesthesia with the patient or authorized representative who has indicated his/her understanding and acceptance.     Dental advisory given  Plan Discussed with: CRNA  Anesthesia Plan Comments: (PAT note written 08/25/2023 by Shonna Chock, PA-C.  )       Anesthesia Quick Evaluation

## 2023-08-25 NOTE — Telephone Encounter (Signed)
Ortho bundle pre-op call completed. 

## 2023-08-25 NOTE — Care Plan (Signed)
[facility] RNCM call to patient and discussed his upcoming Right total hip arthroplasty with Dr. Ophelia Charter on 08/26/23 at [facility]. He is agreeable to case management. His plan is to return home on day of surgery with assistance from his spouse. He has a RW. No other needs. Do not anticipate need for HHPT. Reviewed post op care instructions. Will continue to follow for needs.

## 2023-08-25 NOTE — H&P (Signed)
TOTAL HIP ADMISSION H&P  Patient is admitted for right total hip arthroplasty.  Subjective:  Chief Complaint: right hip pain  HPI: Victor Price, 73 y.o. male, has a history of pain and functional disability in the right hip(s) due to arthritis and patient has failed non-surgical conservative treatments for greater than 12 weeks to include NSAID's and/or analgesics, flexibility and strengthening excercises, use of assistive devices, and activity modification.  Onset of symptoms was gradual starting 4 years ago with rapidlly worsening course since that time.The patient noted no past surgery on the right hip(s).  Patient currently rates pain in the right hip at 5 out of 10 with activity. Patient has night pain, worsening of pain with activity and weight bearing, pain that interfers with activities of daily living, and pain with passive range of motion. Patient has evidence of subchondral cysts and subchondral sclerosis by imaging studies. This condition presents safety issues increasing the risk of falls. This patient has had .  There is no current active infection.  Patient Active Problem List   Diagnosis Date Noted   Pain in right hip 04/19/2023   IFG (impaired fasting glucose) 02/01/2023   Hemorrhoid 11/04/2022   Cystic acne 11/04/2022   Left inguinal hernia 01/27/2022   Carpal tunnel syndrome, left upper limb 12/04/2019   H/O total hip arthroplasty, left 10/19/2019   DDD (degenerative disc disease), lumbar 06/05/2019   Hypothyroidism 10/31/2017   Hypogonadism in male 10/31/2017   CKD (chronic kidney disease) stage 3, GFR 30-59 ml/min (HCC) 02/08/2017   Bronchiectasis (HCC) 07/20/2016   Radiculitis of right cervical region 08/28/2014   DDD (degenerative disc disease), cervical 08/28/2014   Insomnia 04/30/2014   Peripheral neuropathy 04/02/2013   Herniated disc 01/29/2013   BPH (benign prostatic hyperplasia) 12/14/2011   ERECTILE DYSFUNCTION, ORGANIC 07/23/2010   THYROID NODULE, LEFT  05/23/2007   Hyperlipidemia 05/17/2006   HYPERTENSION, BENIGN SYSTEMIC 05/17/2006   Past Medical History:  Diagnosis Date   BPH (benign prostatic hyperplasia)    Bronchiectasis with acute lower respiratory infection (HCC) 08/13/2019   Bronchitis    inhaler prn   Cancer (HCC)    skin cancer?   CKD (chronic kidney disease) stage 3, GFR 30-59 ml/min (HCC)    ED (erectile dysfunction)    GERD (gastroesophageal reflux disease)    HLD (hyperlipidemia)    Hypertension    Pneumonia    gets recurrent PNA in left lung, recent dx 08/13/19 left lower lobe   Seasonal allergies     Past Surgical History:  Procedure Laterality Date   BACK SURGERY  1980s   [facility]   COLONOSCOPY  2004, 2012   several - polyps   MULTIPLE TOOTH EXTRACTIONS     TOTAL HIP ARTHROPLASTY Left 08/24/2019   Procedure: LEFT TOTAL HIP ARTHROPLASTY-DIRECT ANTERIOR;  Surgeon: Eldred Manges, MD;  Location: [facility];  Service: Orthopedics;  Laterality: Left;    Current Outpatient Medications  Medication Sig Dispense Refill Last Dose/Taking   atorvastatin (LIPITOR) 40 MG tablet TAKE 1 TABLET BY MOUTH EVERYDAY AT BEDTIME (Patient not taking: Reported on 08/23/2023) 90 tablet 0    bisoprolol-hydrochlorothiazide (ZIAC) 10-6.25 MG tablet TAKE 1 TABLET BY MOUTH EVERY DAY 90 tablet 0    [employer] ALLERGY RELIEF-D 10-240 MG 24 hr tablet TAKE 1 TABLET BY MOUTH EVERY DAY 30 tablet 10    FARXIGA 10 MG TABS tablet TAKE 1 TABLET BY MOUTH EVERY DAY 90 tablet 2    Lidocaine-Hydrocortisone Ace (ANAMANTLE HC) 3-0.5 % KIT Apply  1 dose rectally up to  Bid prn for spasm. 1 kit PRN    meclizine (ANTIVERT) 25 MG tablet Take 1 tablet (25 mg total) by mouth 3 (three) times daily as needed for dizziness. 30 tablet 0    methimazole (TAPAZOLE) 10 MG tablet TAKE 1 TABLET BY MOUTH TWICE A DAY 180 tablet 0    sildenafil (REVATIO) 20 MG tablet TAKE 2 TO 5 TABLETS BY MOUTH DAILY AS NEEDED 40 tablet 5    sodium chloride HYPERTONIC 3 % nebulizer solution Inhale 4mL via  nebulizer up to 2 times daily as needed to loosen congestion 240 mL 5    tamsulosin (FLOMAX) 0.4 MG CAPS capsule TAKE 1 CAPSULE (0.4 MG TOTAL) BY MOUTH IN THE MORNING AND AT BEDTIME 180 capsule 3    Testosterone 10 MG/ACT (2%) GEL APPLY 1 PUMP TO AN INNER THIGH AND 2 PUMPS TO OPPOSITE INNER THIGH IN AM. Azucena Freed. 120 g 2    traZODone (DESYREL) 50 MG tablet TAKE 1/2 TO 2 TABLETS BY MOUTH AT BEDTIME AS NEEDED SLEEP (Patient taking differently: Take 100 mg by mouth at bedtime.) 180 tablet 3    triamcinolone cream (KENALOG) 0.1 % Apply 1 Application topically 2 (two) times daily as needed (Rash).      umeclidinium-vilanterol (ANORO ELLIPTA) 62.5-25 MCG/ACT AEPB Inhale 1 puff into the lungs daily. 60 each 6    No current facility-administered medications for this visit.   Allergies  Allergen Reactions   Losartan Other (See Comments)    Lightheadedness, dizziness, syncope   Metoprolol Other (See Comments)    dizziness    Social History   Tobacco Use   Smoking status: Never   Smokeless tobacco: Never  Substance Use Topics   Alcohol use: No    Family History  Problem Relation Age of Onset   Dementia Mother    Healthy Father      Review of Systems  All other systems reviewed and are negative.   Objective:  Physical Exam  Vital Signs: Ht 6' (1.829 m)   Wt 160 lb (72.6 kg)   BMI 21.70 kg/m    Physical Exam Constitutional:      Appearance: He is well-developed.  HENT:     Head: Normocephalic and atraumatic.     Right Ear: External ear normal.     Left Ear: External ear normal.  Eyes:     Pupils: Pupils are equal, round, and reactive to light.  Neck:     Thyroid: No thyromegaly.     Trachea: No tracheal deviation.  Cardiovascular:     Rate and Rhythm: Normal rate.  Pulmonary:     Effort: Pulmonary effort is normal.     Breath sounds: No wheezing.  Abdominal:     General: Bowel sounds are normal.     Palpations: Abdomen is soft.  Musculoskeletal:     Cervical back:  Neck supple.  Skin:    General: Skin is warm and dry.     Capillary Refill: Capillary refill takes less than 2 seconds.  Neurological:     Mental Status: He is alert and oriented to person, place, and time.  Psychiatric:        Behavior: Behavior normal.        Thought Content: Thought content normal.        Judgment: Judgment normal.        Ortho Exam patient is 10 degrees internal rotation right hip with severe pain Trendelenburg gait with severe limp.  Severe  pain with hip flexion past 90 degrees.  Opposite left hip has normal internal/external rotation.  Severe limp Trendelenburg gait with facial grimace with weightbearing.   Vital signs in last 24 hours: @VSRANGES @  Labs:   Estimated body mass index is 21.18 kg/m as calculated from the following:   Height as of 08/24/23: 6' (1.829 m).   Weight as of 08/24/23: 70.9 kg.   Imaging Review Plain radiographs demonstrate severe degenerative joint disease of the right hip(s). The bone quality appears to be good for age and reported activity level.      Assessment/Plan:  End stage arthritis, right hip(s)  The patient history, physical examination, clinical judgement of the provider and imaging studies are consistent with end stage degenerative joint disease of the right hip(s) and total hip arthroplasty is deemed medically necessary. The treatment options including medical management, injection therapy, arthroscopy and arthroplasty were discussed at length. The risks and benefits of total hip arthroplasty were presented and reviewed. The risks due to aseptic loosening, infection, stiffness, dislocation/subluxation,  thromboembolic complications and other imponderables were discussed.  The patient acknowledged the explanation, agreed to proceed with the plan and consent was signed. Patient is being admitted for inpatient treatment for surgery, pain control, PT, OT, prophylactic antibiotics, VTE prophylaxis, progressive ambulation and  ADL's and discharge planning.The patient is planning to be discharged home with home health services    Patient's anticipated LOS is less than 2 midnights, meeting these requirements: - Younger than 62 - Lives within 1 hour of care - Has a competent adult at home to recover with post-op recover - NO history of  - Chronic pain requiring opiods  - Diabetes  - Coronary Artery Disease  - Heart failure  - Heart attack  - Stroke  - DVT/VTE  - Cardiac arrhythmia  - Respiratory Failure/COPD  - Renal failure  - Anemia  - Advanced Liver disease

## 2023-08-25 NOTE — Progress Notes (Signed)
Anesthesia Chart Review:  Case: [id number] Date/Time: 08/26/23 0715   Procedure: RIGHT TOTAL HIP ARTHROPLASTY ANTERIOR APPROACH (Right: Hip) - Needs RNFA   Anesthesia type: Spinal   Pre-op diagnosis: right hip osteoarthritis   [facility] RO[facility] 07 / [facility]   Surgeons: Eldred Manges, MD       DISCUSSION: Patient is a 73 year old scheduled for the above procedure.  History includes never smoker, HTN, GERD, ED, bronchiectasis (05/16/17 CT; with recurrent pneumonia), back surgery (1980's), CKD (stage 3), hyperthyroidism, multinodular thyroid, osteoarthritis (left THA 08/24/19). He required scalp stitches on 06/26/23 after a trailer hitch fell on his head (negative head CT).    He is on methimazole for hyperthyroidism. He is also on bisoprolol-hydrochlorothiazide  10-6/25 mg daily. Recently established with endocrinologist Dr. Melene Muller with [facility]. NM thyroid scan in July 2024 showed normal 24 hour iodine uptake, multinodular thyroid gland, potential cold nodule on the right. S/p [name] on 06/28/23 with 1 year follow-up recommended. Thyrotropin Receptor Ab elevated at 2.64 (0.00-1.75), TSH 2.750 and Free T4 0.74 (0.82-1.77), Free T3 2.0 on 06/28/23. Follow-up is scheduled for 11/02/23.  He had bronchiectasis with chronic cough and history of recurrent pneumonia. Had been managed more recently by PCP Dr. Linford Arnold, but re-established with pulmonology (Dr. Judeth Horn) in 2023. Last visit was on 09/01/22 with Ames Dura, NP for acute exacerbation, treated with Augmentin and prednisone with recommended hypertonic saline nebulizer BID and flutter valve 2-3 X/day. He is currently on Anoro Ellipta, NaCl hypertonic nebulizer BID as needed. O2 sat on RA 100% at PAT. He denied chest pain, acute cough and SOB at PAT RN visit.   He is followed by nephrologist Dr. Charlies Silvers for CKD stage 3. Creatinine 1.70 with eGFR 43 on 08/24/23 labs.   Anesthesia team to evaluate on the day of surgery.   VS: BP (!) 145/85    Pulse 90   Temp 36.8 C   Resp 18   Ht 6' (1.829 m)   Wt 70.9 kg   SpO2 100%   BMI 21.18 kg/m    PROVIDERS: Agapito Games, MD is PCP  Warden Fillers, MD is endocrinologist [facility]). Initial consult 06/28/23. Merrilee Jansky, MD is nephrologist ([facility]) Judeth Horn, Molli Hazard, MD is pulmonologist   LABS: Labs reviewed: Acceptable for surgery. Cr 1.70, but appears within his baseline range (Cr 1.58-1.83 since 01/2023). A1c 5.5% on 05/09/23. (all labs ordered are listed, but only abnormal results are displayed)  Labs Reviewed  BASIC METABOLIC PANEL - Abnormal; Notable for the following components:      Result Value   Glucose, Bld 109 (*)    BUN 27 (*)    Creatinine, Ser 1.70 (*)    GFR, Estimated 43 (*)    All other components within normal limits  SURGICAL PCR SCREEN  CBC  TYPE AND SCREEN     IMAGES: MRI Right Hip 08/06/23: IMPRESSION: 1. Moderate-to-severe right femoroacetabular osteoarthritis with full-thickness cartilage loss within the anterior superior greater than posterosuperior aspect. High-grade subchondral marrow edema within the anterior superior right acetabulum. 2. High-grade subchondral marrow edema within the posterosuperior aspect of the right femoral head with subtle subchondral horizontal linear decreased T1 and decreased T2 signal suggesting a small developing acute to subacute subchondral insufficiency fracture. No overlying cortical collapse. 3. Moderate right femoroacetabular joint effusion. 4. Moderate edema within the right groin musculature including the superior aspect of the adductor magnus and the obturator externus. This may be secondary to muscle strain. 5. Mild insertional tendinosis of the  left gluteus minimus tendon. 6. Mild-to-moderate left and mild right common hamstring tendon origin partial-thickness midsubstance tears without tendon retraction. 7. Status post total left hip arthroplasty.   US Thyroid 06/28/23 ([facility]  [facility]): IMPRESSION:  1) Diffuse thyromegaly, with heterogeneous and hypervascular parenchyma, potentially consistent with Graves' disease.  2) Solitary nodule in left midpole, measuring 2.3 x 1.2 x 1.5 cm, with associated punctate echogenic foci and irregular margins.  3) No significant cervical adenopathy.   RECOMMENDATION:  1) Will correlate with results of I-131 uptake/scan.  If the left lobe nodule appears cold on I-131 imaging, would recommend FNA biopsy.  2) Repeat thyroid ultrasound in 1 year.      NM Thyroid 03/08/23: IMPRESSION: 1. Normal 24 hour iodine uptake. 2. Multinodular thyroid gland.  Potential cold nodule on  the RIGHT.   MRI L-spine 10/05/22: IMPRESSION: Multilevel degenerative changes of the lumbar spine, worst at L3-L4, L4-5, and L5-S1:   L1-L2: Mild right and minimal left neural foraminal narrowing.   L2-L3: Mild right subarticular and neural foraminal narrowing.   L3-L4: Moderate spinal canal stenosis. Moderate bilateral neural foraminal stenosis.   L4-L5: Moderate spinal canal stenosis with left greater than right subarticular narrowing. Moderate-severe left and moderate right neural foraminal stenosis.   L5-S1: Mild spinal canal stenosis. Mild-to-moderate bilateral neural foraminal narrowing.    EKG: 08/24/23: Normal sinus rhythm Normal ECG When compared with ECG of 15-Aug-2019 14:16, PREVIOUS ECG IS PRESENT Confirmed by Croitoru, Mihai [phone]) on 08/24/2023 7:12:11 PM   CV: N/A  Past Medical History:  Diagnosis Date   BPH (benign prostatic hyperplasia)    Bronchiectasis with acute lower respiratory infection (HCC) 08/13/2019   Bronchitis    inhaler prn   Cancer (HCC)    skin cancer?   CKD (chronic kidney disease) stage 3, GFR 30-59 ml/min (HCC)    ED (erectile dysfunction)    GERD (gastroesophageal reflux disease)    HLD (hyperlipidemia)    Hypertension    Hyperthyroidism    Multinodular thyroid    Pneumonia    gets recurrent PNA in  left lung, recent dx 08/13/19 left lower lobe   Seasonal allergies     Past Surgical History:  Procedure Laterality Date   BACK SURGERY  1980s   [facility]   COLONOSCOPY  2004, 2012   several - polyps   MULTIPLE TOOTH EXTRACTIONS     TOTAL HIP ARTHROPLASTY Left 08/24/2019   Procedure: LEFT TOTAL HIP ARTHROPLASTY-DIRECT ANTERIOR;  Surgeon: Eldred Manges, MD;  Location: [facility];  Service: Orthopedics;  Laterality: Left;    MEDICATIONS:  atorvastatin (LIPITOR) 40 MG tablet   bisoprolol-hydrochlorothiazide (ZIAC) 10-6.25 MG tablet   [facility] ALLERGY RELIEF-D 10-240 MG 24 hr tablet   FARXIGA 10 MG TABS tablet   Lidocaine-Hydrocortisone Ace (ANAMANTLE HC) 3-0.5 % KIT   meclizine (ANTIVERT) 25 MG tablet   methimazole (TAPAZOLE) 10 MG tablet   sildenafil (REVATIO) 20 MG tablet   sodium chloride HYPERTONIC 3 % nebulizer solution   tamsulosin (FLOMAX) 0.4 MG CAPS capsule   Testosterone 10 MG/ACT (2%) GEL   traZODone (DESYREL) 50 MG tablet   triamcinolone cream (KENALOG) 0.1 %   umeclidinium-vilanterol (ANORO ELLIPTA) 62.5-25 MCG/ACT AEPB   No current facility-administered medications for this encounter.  Instructed at 08/24/23 PAT RN visit to hold Farxiga for 3 days prior to surgery.    Shonna Chock, PA-C Surgical Short Stay/Anesthesiology [facility] Phone [phone] [facility] Phone [phone] 08/25/2023 3:02 PM

## 2023-08-26 ENCOUNTER — Ambulatory Visit (HOSPITAL_COMMUNITY)
Admission: RE | Admit: 2023-08-26 | Discharge: 2023-08-26 | Disposition: A | Discharge status: Home / Self Care | Payer: Medicare HMO | Attending: Orthopaedic Surgery | Admitting: Orthopaedic Surgery

## 2023-08-26 ENCOUNTER — Encounter (HOSPITAL_COMMUNITY)
Admission: RE | Disposition: A | Discharge status: Home / Self Care | Payer: Self-pay | Source: Home / Self Care | Attending: Orthopaedic Surgery

## 2023-08-26 ENCOUNTER — Ambulatory Visit (HOSPITAL_COMMUNITY): Payer: Medicare HMO | Admitting: Vascular Surgery

## 2023-08-26 ENCOUNTER — Other Ambulatory Visit: Payer: Self-pay

## 2023-08-26 ENCOUNTER — Ambulatory Visit (HOSPITAL_COMMUNITY): Payer: Medicare HMO | Admitting: Anesthesiology

## 2023-08-26 ENCOUNTER — Ambulatory Visit (HOSPITAL_COMMUNITY): Payer: Medicare HMO

## 2023-08-26 ENCOUNTER — Encounter (HOSPITAL_COMMUNITY): Payer: Self-pay | Admitting: Orthopaedic Surgery

## 2023-08-26 DIAGNOSIS — E039 Hypothyroidism, unspecified: Secondary | ICD-10-CM

## 2023-08-26 DIAGNOSIS — I129 Hypertensive chronic kidney disease with stage 1 through stage 4 chronic kidney disease, or unspecified chronic kidney disease: Secondary | ICD-10-CM | POA: Diagnosis not present

## 2023-08-26 DIAGNOSIS — G709 Myoneural disorder, unspecified: Secondary | ICD-10-CM | POA: Insufficient documentation

## 2023-08-26 DIAGNOSIS — Z96643 Presence of artificial hip joint, bilateral: Secondary | ICD-10-CM | POA: Diagnosis not present

## 2023-08-26 DIAGNOSIS — I10 Essential (primary) hypertension: Secondary | ICD-10-CM | POA: Diagnosis not present

## 2023-08-26 DIAGNOSIS — Z471 Aftercare following joint replacement surgery: Secondary | ICD-10-CM | POA: Diagnosis not present

## 2023-08-26 DIAGNOSIS — Z96641 Presence of right artificial hip joint: Secondary | ICD-10-CM | POA: Diagnosis present

## 2023-08-26 DIAGNOSIS — N183 Chronic kidney disease, stage 3 unspecified: Secondary | ICD-10-CM

## 2023-08-26 DIAGNOSIS — M1611 Unilateral primary osteoarthritis, right hip: Secondary | ICD-10-CM

## 2023-08-26 DIAGNOSIS — J45909 Unspecified asthma, uncomplicated: Secondary | ICD-10-CM | POA: Diagnosis not present

## 2023-08-26 DIAGNOSIS — E785 Hyperlipidemia, unspecified: Secondary | ICD-10-CM | POA: Diagnosis not present

## 2023-08-26 HISTORY — PX: TOTAL HIP ARTHROPLASTY: SHX124

## 2023-08-26 SURGERY — ARTHROPLASTY, HIP, TOTAL, ANTERIOR APPROACH
Anesthesia: Spinal | Site: Hip | Laterality: Right

## 2023-08-26 MED ORDER — HYDROCODONE-ACETAMINOPHEN 5-325 MG PO TABS: 1.0000 | ORAL_TABLET | ORAL | Status: DC | PRN

## 2023-08-26 MED ORDER — TRANEXAMIC ACID-NACL 1000-0.7 MG/100ML-% IV SOLN
INTRAVENOUS | Status: AC
  Filled 2023-08-26: qty 100

## 2023-08-26 MED ORDER — ONDANSETRON HCL 4 MG/2ML IJ SOLN
INTRAMUSCULAR | Status: AC
  Filled 2023-08-26: qty 2

## 2023-08-26 MED ORDER — ACETAMINOPHEN 500 MG PO TABS: 1000.0000 mg | ORAL_TABLET | Freq: Once | ORAL | Status: AC

## 2023-08-26 MED ORDER — MIDAZOLAM HCL 2 MG/2ML IJ SOLN
INTRAMUSCULAR | Status: AC
  Filled 2023-08-26: qty 2

## 2023-08-26 MED ORDER — MIDAZOLAM HCL 2 MG/2ML IJ SOLN
INTRAMUSCULAR | Status: DC | PRN
  Administered 2023-08-26: 2 mg via INTRAVENOUS

## 2023-08-26 MED ORDER — DEXAMETHASONE SODIUM PHOSPHATE 10 MG/ML IJ SOLN
INTRAMUSCULAR | Status: AC
  Filled 2023-08-26: qty 1

## 2023-08-26 MED ORDER — DOCUSATE SODIUM 100 MG PO CAPS: 100.0000 mg | ORAL_CAPSULE | Freq: Two times a day (BID) | ORAL | Status: DC

## 2023-08-26 MED ORDER — BUPIVACAINE LIPOSOME 1.3 % IJ SUSP
INTRAMUSCULAR | Status: DC | PRN
  Administered 2023-08-26: 10 mL

## 2023-08-26 MED ORDER — UMECLIDINIUM-VILANTEROL 62.5-25 MCG/ACT IN AEPB: 1.0000 | INHALATION_SPRAY | Freq: Every day | RESPIRATORY_TRACT | Status: DC

## 2023-08-26 MED ORDER — ASPIRIN 325 MG PO TABS: 325.0000 mg | ORAL_TABLET | Freq: Every day | ORAL | Status: DC

## 2023-08-26 MED ORDER — DAPAGLIFLOZIN PROPANEDIOL 10 MG PO TABS: 10.0000 mg | ORAL_TABLET | Freq: Every day | ORAL | Status: DC

## 2023-08-26 MED ORDER — TAMSULOSIN HCL 0.4 MG PO CAPS: 0.4000 mg | ORAL_CAPSULE | Freq: Every day | ORAL | Status: DC

## 2023-08-26 MED ORDER — SILDENAFIL CITRATE 20 MG PO TABS: 20.0000 mg | ORAL_TABLET | Freq: Three times a day (TID) | ORAL | Status: DC

## 2023-08-26 MED ORDER — TRAZODONE HCL 100 MG PO TABS: 100.0000 mg | ORAL_TABLET | Freq: Every day | ORAL | Status: DC

## 2023-08-26 MED ORDER — MECLIZINE HCL 25 MG PO TABS: 25.0000 mg | ORAL_TABLET | Freq: Three times a day (TID) | ORAL | Status: DC | PRN

## 2023-08-26 MED ORDER — MENTHOL 3 MG MT LOZG: 1.0000 | LOZENGE | OROMUCOSAL | Status: DC | PRN

## 2023-08-26 MED ORDER — DEXAMETHASONE SODIUM PHOSPHATE 10 MG/ML IJ SOLN
INTRAMUSCULAR | Status: DC | PRN
  Administered 2023-08-26: 5 mg via INTRAVENOUS

## 2023-08-26 MED ORDER — ONDANSETRON HCL 4 MG/2ML IJ SOLN
INTRAMUSCULAR | Status: DC | PRN
  Administered 2023-08-26: 4 mg via INTRAVENOUS

## 2023-08-26 MED ORDER — CEFAZOLIN SODIUM-DEXTROSE 2-4 GM/100ML-% IV SOLN
2.0000 g | INTRAVENOUS | Status: AC
  Administered 2023-08-26: 2 g via INTRAVENOUS

## 2023-08-26 MED ORDER — TIZANIDINE HCL 4 MG PO TABS: 4.0000 mg | ORAL_TABLET | Freq: Three times a day (TID) | ORAL | 0 refills | Status: DC | PRN

## 2023-08-26 MED ORDER — FENTANYL CITRATE (PF) 250 MCG/5ML IJ SOLN
INTRAMUSCULAR | Status: AC
  Filled 2023-08-26: qty 5

## 2023-08-26 MED ORDER — SODIUM CHLORIDE 0.9% FLUSH: 3.0000 mL | INTRAVENOUS | Status: DC | PRN

## 2023-08-26 MED ORDER — HYDROCODONE-ACETAMINOPHEN 7.5-325 MG PO TABS: 1.0000 | ORAL_TABLET | ORAL | Status: DC | PRN

## 2023-08-26 MED ORDER — PHENYLEPHRINE 80 MCG/ML (10ML) SYRINGE FOR IV PUSH (FOR BLOOD PRESSURE SUPPORT)
PREFILLED_SYRINGE | INTRAVENOUS | Status: AC
  Filled 2023-08-26: qty 10

## 2023-08-26 MED ORDER — ACETAMINOPHEN 500 MG PO TABS
ORAL_TABLET | ORAL | Status: AC
  Administered 2023-08-26: 1000 mg via ORAL
  Filled 2023-08-26: qty 2

## 2023-08-26 MED ORDER — LIDOCAINE 2% (20 MG/ML) 5 ML SYRINGE
INTRAMUSCULAR | Status: AC
  Filled 2023-08-26: qty 5

## 2023-08-26 MED ORDER — CHLORHEXIDINE GLUCONATE 0.12 % MT SOLN
OROMUCOSAL | Status: AC
  Administered 2023-08-26: 15 mL via OROMUCOSAL
  Filled 2023-08-26: qty 15

## 2023-08-26 MED ORDER — SODIUM CHLORIDE 0.9 % IV SOLN: INTRAVENOUS | Status: DC | PRN

## 2023-08-26 MED ORDER — 0.9 % SODIUM CHLORIDE (POUR BTL) OPTIME
TOPICAL | Status: DC | PRN
  Administered 2023-08-26: 1000 mL

## 2023-08-26 MED ORDER — PHENOL 1.4 % MT LIQD: 1.0000 | OROMUCOSAL | Status: DC | PRN

## 2023-08-26 MED ORDER — MORPHINE SULFATE (PF) 2 MG/ML IV SOLN
0.5000 mg | INTRAVENOUS | Status: DC | PRN
Start: 2023-08-26 — End: 2023-08-26

## 2023-08-26 MED ORDER — BUPIVACAINE LIPOSOME 1.3 % IJ SUSP
INTRAMUSCULAR | Status: AC
  Filled 2023-08-26: qty 20

## 2023-08-26 MED ORDER — ACETAMINOPHEN 500 MG PO TABS: 500.0000 mg | ORAL_TABLET | Freq: Four times a day (QID) | ORAL | Status: DC

## 2023-08-26 MED ORDER — BUPIVACAINE IN DEXTROSE 0.75-8.25 % IT SOLN
INTRATHECAL | Status: DC | PRN
  Administered 2023-08-26: 1.8 mL via INTRATHECAL

## 2023-08-26 MED ORDER — CHLORHEXIDINE GLUCONATE 0.12 % MT SOLN: 15.0000 mL | Freq: Once | OROMUCOSAL | Status: AC

## 2023-08-26 MED ORDER — ACETAMINOPHEN 325 MG PO TABS: 325.0000 mg | ORAL_TABLET | Freq: Four times a day (QID) | ORAL | Status: DC | PRN

## 2023-08-26 MED ORDER — METHIMAZOLE 10 MG PO TABS: 10.0000 mg | ORAL_TABLET | Freq: Two times a day (BID) | ORAL | Status: DC

## 2023-08-26 MED ORDER — ORAL CARE MOUTH RINSE: 15.0000 mL | Freq: Once | OROMUCOSAL | Status: AC

## 2023-08-26 MED ORDER — FERROUS SULFATE 325 (65 FE) MG PO TABS: 325.0000 mg | ORAL_TABLET | Freq: Three times a day (TID) | ORAL | Status: DC

## 2023-08-26 MED ORDER — FENTANYL CITRATE (PF) 100 MCG/2ML IJ SOLN: 25.0000 ug | INTRAMUSCULAR | Status: DC | PRN

## 2023-08-26 MED ORDER — SODIUM CHLORIDE 0.9% FLUSH
3.0000 mL | Freq: Two times a day (BID) | INTRAVENOUS | Status: DC
Start: 2023-08-26 — End: 2023-08-26

## 2023-08-26 MED ORDER — TRIAMCINOLONE ACETONIDE 0.1 % EX CREA: 1.0000 | TOPICAL_CREAM | Freq: Two times a day (BID) | CUTANEOUS | Status: DC | PRN

## 2023-08-26 MED ORDER — CEFAZOLIN SODIUM-DEXTROSE 2-4 GM/100ML-% IV SOLN
INTRAVENOUS | Status: AC
  Filled 2023-08-26: qty 100

## 2023-08-26 MED ORDER — LORATADINE-PSEUDOEPHEDRINE ER 10-240 MG PO TB24: 1.0000 | ORAL_TABLET | Freq: Every day | ORAL | Status: DC

## 2023-08-26 MED ORDER — ONDANSETRON HCL 4 MG PO TABS
4.0000 mg | ORAL_TABLET | Freq: Four times a day (QID) | ORAL | Status: DC | PRN
Start: 2023-08-26 — End: 2023-08-26

## 2023-08-26 MED ORDER — DIPHENHYDRAMINE HCL 12.5 MG/5ML PO ELIX: 12.5000 mg | ORAL_SOLUTION | ORAL | Status: DC | PRN

## 2023-08-26 MED ORDER — PROPOFOL 500 MG/50ML IV EMUL
INTRAVENOUS | Status: DC | PRN
  Administered 2023-08-26: 150 ug/kg/min via INTRAVENOUS

## 2023-08-26 MED ORDER — ONDANSETRON HCL 4 MG/2ML IJ SOLN: 4.0000 mg | Freq: Four times a day (QID) | INTRAMUSCULAR | Status: DC | PRN

## 2023-08-26 MED ORDER — ASPIRIN 325 MG PO TBEC: 325.0000 mg | DELAYED_RELEASE_TABLET | Freq: Every day | ORAL | Status: DC

## 2023-08-26 MED ORDER — FENTANYL CITRATE (PF) 250 MCG/5ML IJ SOLN
INTRAMUSCULAR | Status: DC | PRN
  Administered 2023-08-26 (×2): 50 ug via INTRAVENOUS

## 2023-08-26 MED ORDER — ATORVASTATIN CALCIUM 40 MG PO TABS: 40.0000 mg | ORAL_TABLET | Freq: Every day | ORAL | Status: DC

## 2023-08-26 MED ORDER — SODIUM CHLORIDE 3 % IN NEBU: 4.0000 mL | INHALATION_SOLUTION | RESPIRATORY_TRACT | Status: DC | PRN

## 2023-08-26 MED ORDER — TESTOSTERONE 10 MG/ACT (2%) TD GEL: 1.0000 | Freq: Every day | TRANSDERMAL | Status: DC

## 2023-08-26 MED ORDER — METOCLOPRAMIDE HCL 5 MG PO TABS: 5.0000 mg | ORAL_TABLET | Freq: Three times a day (TID) | ORAL | Status: DC | PRN

## 2023-08-26 MED ORDER — PHENYLEPHRINE 80 MCG/ML (10ML) SYRINGE FOR IV PUSH (FOR BLOOD PRESSURE SUPPORT)
PREFILLED_SYRINGE | INTRAVENOUS | Status: DC | PRN
  Administered 2023-08-26 (×3): 160 ug via INTRAVENOUS

## 2023-08-26 MED ORDER — PROPOFOL 10 MG/ML IV BOLUS
INTRAVENOUS | Status: AC
  Filled 2023-08-26: qty 20

## 2023-08-26 MED ORDER — BISOPROLOL-HYDROCHLOROTHIAZIDE 10-6.25 MG PO TABS: 1.0000 | ORAL_TABLET | Freq: Every day | ORAL | Status: DC

## 2023-08-26 MED ORDER — TRANEXAMIC ACID-NACL 1000-0.7 MG/100ML-% IV SOLN
1000.0000 mg | INTRAVENOUS | Status: AC
  Administered 2023-08-26: 1000 mg via INTRAVENOUS

## 2023-08-26 MED ORDER — BUPIVACAINE HCL (PF) 0.5 % IJ SOLN
INTRAMUSCULAR | Status: AC
  Filled 2023-08-26: qty 30

## 2023-08-26 MED ORDER — OXYCODONE-ACETAMINOPHEN 5-325 MG PO TABS: 1.0000 | ORAL_TABLET | ORAL | 0 refills | Status: DC | PRN

## 2023-08-26 MED ORDER — PHENYLEPHRINE HCL-NACL 20-0.9 MG/250ML-% IV SOLN
INTRAVENOUS | Status: DC | PRN
  Administered 2023-08-26: 20 ug/min via INTRAVENOUS

## 2023-08-26 MED ORDER — BUPIVACAINE HCL 0.5 % IJ SOLN
INTRAMUSCULAR | Status: DC | PRN
  Administered 2023-08-26: 10 mL

## 2023-08-26 MED ORDER — ONDANSETRON HCL 4 MG/2ML IJ SOLN: 4.0000 mg | Freq: Once | INTRAMUSCULAR | Status: DC | PRN

## 2023-08-26 MED ORDER — METOCLOPRAMIDE HCL 5 MG/ML IJ SOLN: 5.0000 mg | Freq: Three times a day (TID) | INTRAMUSCULAR | Status: DC | PRN

## 2023-08-26 SURGICAL SUPPLY — 54 items
ACETAB CUP W/GRIPTION 54 (Plate) ×1 IMPLANT
BAG COUNTER SPONGE SURGICOUNT (BAG) ×2 IMPLANT
BENZOIN TINCTURE PRP APPL 2/3 (GAUZE/BANDAGES/DRESSINGS) ×2 IMPLANT
BLADE CLIPPER SURG (BLADE) IMPLANT
BLADE SAW SGTL 18X1.27X75 (BLADE) ×2 IMPLANT
COVER PERINEAL POST (MISCELLANEOUS) ×2 IMPLANT
COVER SURGICAL LIGHT HANDLE (MISCELLANEOUS) ×2 IMPLANT
CUP ACETAB W/GRIPTION 54 (Plate) IMPLANT
DERMABOND ADVANCED .7 DNX12 (GAUZE/BANDAGES/DRESSINGS) IMPLANT
DRAPE C-ARM 42X72 X-RAY (DRAPES) ×2 IMPLANT
DRAPE IMP U-DRAPE 54X76 (DRAPES) ×2 IMPLANT
DRAPE STERI IOBAN 125X83 (DRAPES) ×2 IMPLANT
DRAPE U-SHAPE 47X51 STRL (DRAPES) ×6 IMPLANT
DRSG AQUACEL AG ADV 3.5X10 (GAUZE/BANDAGES/DRESSINGS) IMPLANT
DRSG MEPILEX POST OP 4X8 (GAUZE/BANDAGES/DRESSINGS) ×2 IMPLANT
DURAPREP 26ML APPLICATOR (WOUND CARE) ×2 IMPLANT
ELECT BLADE 4.0 EZ CLEAN MEGAD (MISCELLANEOUS)
ELECT CAUTERY BLADE 6.4 (BLADE) ×2 IMPLANT
ELECT REM PT RETURN 9FT ADLT (ELECTROSURGICAL) ×1
ELECTRODE BLDE 4.0 EZ CLN MEGD (MISCELLANEOUS) IMPLANT
ELECTRODE REM PT RTRN 9FT ADLT (ELECTROSURGICAL) ×2 IMPLANT
ELIMINATOR HOLE APEX DEPUY (Hips) IMPLANT
FACESHIELD WRAPAROUND (MASK) ×2
FACESHIELD WRAPAROUND OR TEAM (MASK) ×4 IMPLANT
GLOVE BIOGEL PI IND STRL 8 (GLOVE) ×4 IMPLANT
GLOVE ORTHO TXT STRL SZ7.5 (GLOVE) ×4 IMPLANT
GOWN STRL REUS W/ TWL LRG LVL3 (GOWN DISPOSABLE) ×2 IMPLANT
GOWN STRL REUS W/ TWL XL LVL3 (GOWN DISPOSABLE) ×2 IMPLANT
GOWN STRL REUS W/TWL 2XL LVL3 (GOWN DISPOSABLE) ×2 IMPLANT
HEAD CERAMIC DELTA 36 PLUS 1.5 (Hips) IMPLANT
KIT BASIN OR (CUSTOM PROCEDURE TRAY) ×2 IMPLANT
KIT TURNOVER KIT B (KITS) ×2 IMPLANT
LINER NEUTRAL 54X36MM PLUS 4 (Hips) IMPLANT
MANIFOLD NEPTUNE II (INSTRUMENTS) ×2 IMPLANT
NDL HYPO 21X1 ECLIPSE (NEEDLE) ×2 IMPLANT
NEEDLE HYPO 21X1 ECLIPSE (NEEDLE)
NS IRRIG 1000ML POUR BTL (IV SOLUTION) ×2 IMPLANT
PACK TOTAL JOINT (CUSTOM PROCEDURE TRAY) ×2 IMPLANT
PAD ARMBOARD 7.5X6 YLW CONV (MISCELLANEOUS) ×4 IMPLANT
PULSAVAC PLUS IRRIG FAN TIP (DISPOSABLE)
STEM FEMORAL SZ 6MM STD ACTIS (Stem) IMPLANT
STRIP CLOSURE SKIN 1/2X4 (GAUZE/BANDAGES/DRESSINGS) ×2 IMPLANT
SUT MNCRL AB 3-0 PS2 27 (SUTURE) IMPLANT
SUT VIC AB 0 CT1 27XBRD ANBCTR (SUTURE) ×2 IMPLANT
SUT VIC AB 1 CT1 27XBRD ANBCTR (SUTURE) IMPLANT
SUT VIC AB 2-0 CT1 TAPERPNT 27 (SUTURE) ×2 IMPLANT
SUT VICRYL 4-0 PS2 18IN ABS (SUTURE) ×2 IMPLANT
SUT VLOC 180 0 24IN GS25 (SUTURE) ×2 IMPLANT
SYR 20CC LL (SYRINGE) ×2 IMPLANT
TIP FAN IRRIG PULSAVAC PLUS (DISPOSABLE) IMPLANT
TOWEL GREEN STERILE (TOWEL DISPOSABLE) ×4 IMPLANT
TOWEL GREEN STERILE FF (TOWEL DISPOSABLE) ×2 IMPLANT
TRAY FOLEY MTR SLVR 16FR STAT (SET/KITS/TRAYS/PACK) ×2 IMPLANT
WATER STERILE IRR 1000ML POUR (IV SOLUTION) ×4 IMPLANT

## 2023-08-26 NOTE — Discharge Instructions (Signed)

## 2023-08-26 NOTE — Progress Notes (Signed)
Patient ID: Victor Price, male   DOB: 1951-05-19, 73 y.o.   MRN: [id number] RIght THA spinal and expiril/marcaine. He went home afternoon of surgery after his left THA and wants therapy and to go home this afternoon.

## 2023-08-26 NOTE — Op Note (Signed)
Pre and postop diagnosis: Right hip primary osteoarthritis  Procedure: Right total hip arthroplasty, direct anterior approach  Surgeon: UTD  Assistant: Isabella Bowens, RNFA  Anesthesia spinal plus Exparel and Marcaine 10+10  Complications none  Implants:Implants  ACETAB CUP W/GRIPTION 54 - [id number]  Inventory Item: ACETAB CUP W/GRIPTION 54 Serial no.: Model/Cat no.: [id number]  Implant name: ACETAB CUP W/GRIPTION 54 - [id number] Laterality: Right Area: Hip  Manufacturer: [employer] Date of Manufacture:   Action: Implanted Number Used: 1   Device Identifier: Device Identifier TypeLavone Neri DEPUY - [id number]  Inventory Item: [facility] APEX DEPUY Serial no.: Model/Cat no.: [id number]  Implant nameWaylan Rocher APEX DEPUY - [id number] Laterality: Right Area: Hip  Manufacturer: [employer] Date of Manufacture:   Action: Implanted Number Used: 1   Device Identifier: Device Identifier Type:   LINER NEUTRAL 54X36MM PLUS 4 - [id number]  Inventory Item: LINER NEUTRAL 54X36MM PLUS 4 Serial no.: Model/Cat no.: [id number]  Implant name: LINER NEUTRAL 54X36MM PLUS 4 - [id number] Laterality: Right Area: Hip  Manufacturer: [employer] Date of Manufacture:   Action: Implanted Number Used: 1   Device Identifier: Device Identifier Type:   STEM FEMORAL SZ STD ACTIS - [id number]  Inventory Item: STEM FEMORAL SZ STD ACTIS Serial no.: Model/Cat no.: [id number]  Implant name: STEM FEMORAL SZ STD ACTIS - [id number] Laterality: Right Area: Hip  Manufacturer: [employer] Date of Manufacture:   Action: Implanted Number Used: 1   Device Identifier: Device Identifier Type:   HEAD CERAMIC DELTA 36 PLUS 1.5 - [id number]   Inventory Item: HEAD CERAMIC DELTA 36 PLUS 1.5 Serial no.: Model/Cat no.: [id number]  Implant name: HEAD CERAMIC DELTA 36 PLUS 1.5 - [id number] Laterality: Right Area: Hip  Manufacturer: [employer] Date of  Manufacture:   Action: Implanted Number Used: 1   Device Identifier: Device Identifier Type:     Procedure after induction of spinal anesthesia Foley catheter placement Unna boot placement patient placed on the Hana table C arm was brought in good visualization.  Patient had previous left total part plasty.  Hip was prepped with DuraPrep IV Ancef and IV TXA 1 g was given.  Timeout procedure was completed after prepping and draping the hydraulic arm and been applied and sealed and sterile Betadine impregnated shower curtain type drape was applied.  Incision was made 2 fingerbreadths lateral to inferior to the ASIS obliquely on the right side to the trochanter fascia was nicked extended and dull Carobel placed medially over the top of the capsule.  Retractor lateral.  Minimal amount of fat capsule was opened there was a gush of clear yellow fluid.  Neck was initially cut a fingerbreadth and a half above the lesser trochanter come back with An additional 3 to 4 mm states she had to have a symmetrical 11 mm neck cut with the opposite total hip.  Once it was removed with a corkscrew there was erosive changes eburnated bone and total cartilage worn off the head.  Preparation the acetabulum we had to rearrangement slightly medialized a little bit more in order to get the Sit and no screw was needed.  54 cup was used and it was reamed to 53.  C-arm was used for cup flexion and abduction.  Hydraulic arm was applied leg was externally rotated 120 taken down and under and canal was repaired progressing up to #6 that lacked about a millimeter sitting completely down since it was extremely tight permanent #16 was  inserted identical to the opposite side tight and collar set 1 mm off the calcar.  He was impacted but would not advance any further.  +1.5 ball was trialed and gave excellent stability external rotation 90 degrees leg taken halfway down for no subluxation symmetrical leg length measurement on C arm and only trace  shuck with pulling.  Hip was stable AP and frog-leg showed stem directly down the canal.  Permanent 1.5 mm having ball was inserted and extra exploration for bleeders open operative field was dry standard layered closure V-Loc closure #1-0 Vicryl 2-0 Vicryl subtenons tissue subcuticular closure postop dressing Aquacel and transferred to care room.  Patient will mobilize with therapy and go home this afternoon as he did with his opposite hip.

## 2023-08-26 NOTE — Transfer of Care (Signed)
Immediate Anesthesia Transfer of Care Note  Patient: AKI DIBELLA  Procedure(s) Performed: RIGHT TOTAL HIP ARTHROPLASTY (Right: Hip)  Patient Location: PACU  Anesthesia Type:Spinal  Level of Consciousness: awake, alert , and oriented  Airway & Oxygen Therapy: Patient Spontanous Breathing  Post-op Assessment: Report given to RN and Post -op Vital signs reviewed and stable  Post vital signs: Reviewed and stable  Last Vitals:  Vitals Value Taken Time  BP 114/76 08/26/23 0945  Temp    Pulse 64 08/26/23 0955  Resp 14 08/26/23 0955  SpO2 99 % 08/26/23 0955  Vitals shown include unfiled device data.  Last Pain:  Vitals:   08/26/23 0600  TempSrc: Oral  PainSc: 5          Complications: No notable events documented.

## 2023-08-26 NOTE — Interval H&P Note (Signed)
History and Physical Interval Note:  08/26/2023 7:26 AM  Victor Price  has presented today for surgery, with the diagnosis of right hip osteoarthritis.  The various methods of treatment have been discussed with the patient and family. After consideration of risks, benefits and other options for treatment, the patient has consented to  Procedure(s) with comments: RIGHT TOTAL HIP ARTHROPLASTY ANTERIOR APPROACH (Right) - Needs RNFA as a surgical intervention.  The patient's history has been reviewed, patient examined, no change in status, stable for surgery.  I have reviewed the patient's chart and labs.  Questions were answered to the patient's satisfaction.     Eldred Manges

## 2023-08-26 NOTE — Progress Notes (Signed)
Physical Therapy Brief Evaluation and Discharge Note  Patient Details Name: Victor Price MRN: [id number] DOB: 11/21/1950 Today's Date: 08/26/2023   History of Present Illness  Pt is a 73 y/o male who presents s/p R total hip arthroplasty, direct anterior approach on 08/26/2023. PMH significant for L THA, CA, CKD III, HTN, hyperthyroidism, back surgery 1980's.   Clinical Impression  Patient evaluated by Physical Therapy with no further acute PT needs identified. All education has been completed and the patient has no further questions. At the time of PT eval pt was able to perform transfers and ambulation with gross modified independence to CGA and no AD. Per MD, recommend RW for safety however pt declines use on eval. No knee buckling noted. See below for any follow-up Physical Therapy or equipment needs. PT is signing off. Thank you for this referral.        PT Assessment Patient does not need any further PT services  Assistance Needed at Discharge  PRN    Equipment Recommendations None recommended by PT  Recommendations for Other Services       Precautions/Restrictions Precautions Precautions: None Precaution Comments: Direct anterior approach. Restrictions Weight Bearing Restrictions Per Provider Order: Yes RLE Weight Bearing Per Provider Order: Weight bearing as tolerated        Mobility  Bed Mobility   Supine/Sidelying to sit: Modified independent (Device/Increased time)   General bed mobility comments: No assist required.  Transfers Overall transfer level: Modified independent Equipment used: None                    Ambulation/Gait Ambulation/Gait assistance: Contact guard assist Gait Distance (Feet): 250 Feet Assistive device: None Gait Pattern/deviations: Step-through pattern, Decreased dorsiflexion - right, Decreased stride length, Decreased weight shift to right Gait Speed: Below normal General Gait Details: Pt ambulating well without gross  unsteadiness or LOB.  Home Activity Instructions Home Activity Instructions: Verbally reviewed sequencing for stair negotiation.  Stairs            Modified Rankin (Stroke Patients Only)        Balance Overall balance assessment: Mild deficits observed, not formally tested Sitting-balance support: Feet supported, No upper extremity supported Sitting balance-Leahy Scale: Good     Standing balance support: No upper extremity supported, During functional activity Standing balance-Leahy Scale: Poor            Pertinent Vitals/Pain PT - Brief Vital Signs All Vital Signs Stable: Yes Pain Assessment Pain Assessment: Faces Faces Pain Scale: Hurts a little bit Pain Location: R hip Pain Descriptors / Indicators: Operative site guarding Pain Intervention(s): Limited activity within patient's tolerance, Monitored during session, Repositioned     Home Living Family/patient expects to be discharged to:: Private residence Living Arrangements: Spouse/significant other Available Help at Discharge: Family;Available 24 hours/day (first 2 days) Home Environment: Stairs to enter;Stairs in home  [place] of Steps: 3 to enter, flight to second story Home Equipment: Agricultural consultant (2 wheels);Shower seat        Prior Function Level of Independence: Independent      UE/LE Assessment   UE ROM/Strength/Tone/Coordination: WFL    LE ROM/Strength/Tone/Coordination: Impaired LE ROM/Strength/Tone/Coordination Deficits: Decreased strength and AROM consistent with above mentioned surgery.    Communication   Communication Communication: No apparent difficulties Cueing Techniques: Verbal cues;Gestural cues     Cognition Overall Cognitive Status: Appears within functional limits for tasks assessed/performed       General Comments      Exercises Total Joint Exercises  Ankle Circles/Pumps: 10 reps Quad Sets: 10 reps Heel Slides: 10 reps Hip ABduction/ADduction: 10  reps Long Arc Quad: 10 reps   Assessment/Plan    PT Problem List         PT Visit Diagnosis Unsteadiness on feet (R26.81);Pain    No Skilled PT All education completed;Patient is modified independent with all activity/mobility   Co-evaluation                AMPAC 6 Clicks Help needed turning from your back to your side while in a flat bed without using bedrails?: None Help needed moving from lying on your back to sitting on the side of a flat bed without using bedrails?: None Help needed moving to and from a bed to a chair (including a wheelchair)?: None Help needed standing up from a chair using your arms (e.g., wheelchair or bedside chair)?: None Help needed to walk in hospital room?: A Little Help needed climbing 3-5 steps with a railing? : A Little 6 Click Score: 22      End of Session Equipment Utilized During Treatment: Gait belt Activity Tolerance: Patient tolerated treatment well Patient left: in bed;with call bell/phone within reach;with nursing/sitter in room;with family/visitor present Nurse Communication: Mobility status PT Visit Diagnosis: Unsteadiness on feet (R26.81);Pain Pain - Right/Left: Right Pain - part of body: Hip     Time: 1324-4010 PT Time Calculation (min) (ACUTE ONLY): 18 min  Charges:   PT Evaluation $PT Eval Low Complexity: 1 Low      Conni Slipper, PT, DPT Acute Rehabilitation Services Secure Chat Preferred Office: [phone]   Marylynn Pearson  08/26/2023, 2:48 PM

## 2023-08-26 NOTE — Anesthesia Procedure Notes (Signed)
Spinal  Patient location during procedure: OR Start time: 08/26/2023 7:39 AM End time: 08/26/2023 7:42 AM Reason for block: surgical anesthesia Staffing Performed: anesthesiologist  Anesthesiologist: Collene Schlichter, MD Performed by: Collene Schlichter, MD Authorized by: Collene Schlichter, MD   Preanesthetic Checklist Completed: patient identified, IV checked, risks and benefits discussed, surgical consent, monitors and equipment checked, pre-op evaluation and timeout performed Spinal Block Patient position: sitting Prep: DuraPrep and site prepped and draped Patient monitoring: continuous pulse ox and blood pressure Approach: midline Location: L3-4 Injection technique: single-shot Needle Needle type: Pencan  Needle gauge: 24 G Assessment Events: CSF return Additional Notes Functioning IV was confirmed and monitors were applied. Sterile prep and drape, including hand hygiene, mask and sterile gloves were used. The patient was positioned and the spine was prepped. The skin was anesthetized with lidocaine.  Free flow of clear CSF was obtained prior to injecting local anesthetic into the CSF.  The spinal needle aspirated freely following injection.  The needle was carefully withdrawn.  The patient tolerated the procedure well. Consent was obtained prior to procedure with all questions answered and concerns addressed. Risks including but not limited to bleeding, infection, nerve damage, paralysis, failed block, inadequate analgesia, allergic reaction, high spinal, itching and headache were discussed and the patient wished to proceed.   Arrie Aran, MD

## 2023-08-28 NOTE — Anesthesia Postprocedure Evaluation (Signed)
Anesthesia Post Note  Patient: [name]  Procedure(s) Performed: RIGHT TOTAL HIP ARTHROPLASTY (Right: Hip)     Patient location during evaluation: PACU Anesthesia Type: Spinal Level of consciousness: awake, awake and alert and oriented Pain management: pain level controlled Vital Signs Assessment: post-procedure vital signs reviewed and stable Respiratory status: spontaneous breathing, nonlabored ventilation and respiratory function stable Cardiovascular status: blood pressure returned to baseline and stable Postop Assessment: no headache, no backache, spinal receding and no apparent nausea or vomiting Anesthetic complications: no   No notable events documented.  Last Vitals:  Vitals:   08/26/23 1230 08/26/23 1300  BP: (!) 131/95 (!) 142/75  Pulse: 71 78  Resp: 18 12  Temp:    SpO2: 98% 96%    Last Pain:  Vitals:   08/26/23 1215  TempSrc:   PainSc: 0-No pain                 Collene Schlichter

## 2023-08-29 ENCOUNTER — Encounter (HOSPITAL_COMMUNITY): Payer: Self-pay | Admitting: Orthopaedic Surgery

## 2023-09-02 ENCOUNTER — Other Ambulatory Visit (INDEPENDENT_AMBULATORY_CARE_PROVIDER_SITE_OTHER): Payer: Self-pay

## 2023-09-02 ENCOUNTER — Ambulatory Visit (INDEPENDENT_AMBULATORY_CARE_PROVIDER_SITE_OTHER): Payer: Medicare HMO | Admitting: Orthopaedic Surgery

## 2023-09-02 VITALS — BP 162/80 | HR 98 | Wt 165.0 lb

## 2023-09-02 DIAGNOSIS — M25551 Pain in right hip: Secondary | ICD-10-CM

## 2023-09-02 MED ORDER — GABAPENTIN 300 MG PO CAPS
300.0000 mg | ORAL_CAPSULE | Freq: Two times a day (BID) | ORAL | 2 refills | Status: DC
Start: 2023-09-02 — End: 2024-01-06

## 2023-09-02 NOTE — Progress Notes (Signed)
Post-Op Visit Note   Patient: Victor Price           Date of Birth: 02/07/1951           MRN: [id number] Visit Date: 09/02/2023 PCP: Agapito Games, MD   Assessment & Plan: Patient has some puffiness of his hip incisional erythema no drainage she has was closed with Dermabond and subcuticular closure.  X-rays look good.  He only took 1 pain pill he is walking with a cane most the time does not use the cane.  He has had problems with chronic numbness in his feet and has some stenosis at L4-5 some changes on electrical test S1 but primarily large fiber sensorimotor polyneuropathy.  Will try some Neurontin to help with his feet tingling and stinging at night.  He will take 300 mg at night for 5 to 6 days and then go to 2 tablets at night.  Recheck 4 weeks.  Chief Complaint:  Chief Complaint  Patient presents with   Right Hip - Follow-up    08/26/23 Right THA   Visit Diagnoses:  1. Pain in right hip     Plan: ROV 4 wks   Follow-Up Instructions: Return in about 4 years (around 09/02/2027).   Orders:  Orders Placed This Encounter  Procedures   XR HIP UNILAT W OR W/O PELVIS 1V RIGHT   Meds ordered this encounter  Medications   gabapentin (NEURONTIN) 300 MG capsule    Sig: Take 1 capsule (300 mg total) by mouth 2 (two) times daily.    Dispense:  60 capsule    Refill:  2    Imaging: XR HIP UNILAT W OR W/O PELVIS 1V RIGHT Result Date: 09/02/2023 Standing AP both hips frog-leg right hip obtained and reviewed this shows new right total hip arthroplasty.  Good offset good canal fill no subsidence or loosening. Impression: Satisfactory right total hip arthroplasty.   PMFS History: Patient Active Problem List   Diagnosis Date Noted   Unilateral primary osteoarthritis, right hip 08/26/2023   S/P total right hip arthroplasty 08/26/2023   Pain in right hip 04/19/2023   IFG (impaired fasting glucose) 02/01/2023   Hemorrhoid 11/04/2022   Cystic acne 11/04/2022   Left inguinal  hernia 01/27/2022   Carpal tunnel syndrome, left upper limb 12/04/2019   H/O total hip arthroplasty, left 10/19/2019   DDD (degenerative disc disease), lumbar 06/05/2019   Hypothyroidism 10/31/2017   Hypogonadism in male 10/31/2017   CKD (chronic kidney disease) stage 3, GFR 30-59 ml/min (HCC) 02/08/2017   Bronchiectasis (HCC) 07/20/2016   Radiculitis of right cervical region 08/28/2014   DDD (degenerative disc disease), cervical 08/28/2014   Insomnia 04/30/2014   Peripheral neuropathy 04/02/2013   Herniated disc 01/29/2013   BPH (benign prostatic hyperplasia) 12/14/2011   ERECTILE DYSFUNCTION, ORGANIC 07/23/2010   THYROID NODULE, LEFT 05/23/2007   Hyperlipidemia 05/17/2006   HYPERTENSION, BENIGN SYSTEMIC 05/17/2006   Past Medical History:  Diagnosis Date   BPH (benign prostatic hyperplasia)    Bronchiectasis with acute lower respiratory infection (HCC) 08/13/2019   Bronchitis    inhaler prn   Cancer (HCC)    skin cancer?   CKD (chronic kidney disease) stage 3, GFR 30-59 ml/min (HCC)    ED (erectile dysfunction)    GERD (gastroesophageal reflux disease)    HLD (hyperlipidemia)    Hypertension    Hyperthyroidism    Multinodular thyroid    Pneumonia    gets recurrent PNA in left lung, recent dx 08/13/19  left lower lobe   Seasonal allergies     Family History  Problem Relation Age of Onset   Dementia Mother    Healthy Father     Past Surgical History:  Procedure Laterality Date   BACK SURGERY  1980s   [facility]   COLONOSCOPY  2004, 2012   several - polyps   MULTIPLE TOOTH EXTRACTIONS     TOTAL HIP ARTHROPLASTY Left 08/24/2019   Procedure: LEFT TOTAL HIP ARTHROPLASTY-DIRECT ANTERIOR;  Surgeon: Eldred Manges, MD;  Location: [facility] OR;  Service: Orthopedics;  Laterality: Left;   TOTAL HIP ARTHROPLASTY Right 08/26/2023   Procedure: RIGHT TOTAL HIP ARTHROPLASTY;  Surgeon: Eldred Manges, MD;  Location: [facility] OR;  Service: Orthopedics;  Laterality: Right;  Needs RNFA   Social History    Occupational History   Occupation: [employer]    Employer: [employer]    Comment: Retired    Comment: Part-time  Tobacco Use   Smoking status: Never   Smokeless tobacco: Never  Vaping Use   Vaping status: Never Used  Substance and Sexual Activity   Alcohol use: No   Drug use: No   Sexual activity: Not on file

## 2023-09-12 ENCOUNTER — Telehealth: Payer: Self-pay | Admitting: Orthopaedic Surgery

## 2023-09-12 NOTE — Telephone Encounter (Signed)
 I called patient and advised.

## 2023-09-12 NOTE — Telephone Encounter (Signed)
Patient called. Would like to know when he can take the bandage off? His call back number [phone]

## 2023-09-30 ENCOUNTER — Encounter: Payer: Self-pay | Admitting: Orthopaedic Surgery

## 2023-09-30 ENCOUNTER — Ambulatory Visit: Payer: Medicare HMO | Admitting: Orthopaedic Surgery

## 2023-09-30 VITALS — BP 144/71 | HR 84 | Ht 72.0 in | Wt 165.0 lb

## 2023-09-30 DIAGNOSIS — Z96641 Presence of right artificial hip joint: Secondary | ICD-10-CM

## 2023-09-30 NOTE — Progress Notes (Signed)
Post-Op Visit Note   Patient: Victor Price           Date of Birth: Apr 06, 1951           MRN: [id number] Visit Date: 09/30/2023 PCP: Agapito Games, MD   Assessment & Plan: Patient swelling down both hips doing well.  He has peripheral neuropathy has seen a neurologist unknown cause.  He has been on some Neurontin is not really helping lab and stop it for 5 days he can retake up in 5 days if he does not notice any difference he can discontinue the medication.  He is happy with results of both hip replacements.  He will follow-up as needed.  Chief Complaint:  Chief Complaint  Patient presents with   Right Hip - Routine Post Op    08/26/2023 Right THA   Visit Diagnoses:  1. S/P total right hip arthroplasty     Plan: Return as needed.  Follow-Up Instructions: Return if symptoms worsen or fail to improve.   Orders:  No orders of the defined types were placed in this encounter.  No orders of the defined types were placed in this encounter.   Imaging: No results found.  PMFS History: Patient Active Problem List   Diagnosis Date Noted   S/P total right hip arthroplasty 08/26/2023   Pain in right hip 04/19/2023   IFG (impaired fasting glucose) 02/01/2023   Hemorrhoid 11/04/2022   Cystic acne 11/04/2022   Left inguinal hernia 01/27/2022   Carpal tunnel syndrome, left upper limb 12/04/2019   H/O total hip arthroplasty, left 10/19/2019   DDD (degenerative disc disease), lumbar 06/05/2019   Hypothyroidism 10/31/2017   Hypogonadism in male 10/31/2017   CKD (chronic kidney disease) stage 3, GFR 30-59 ml/min (HCC) 02/08/2017   Bronchiectasis (HCC) 07/20/2016   Radiculitis of right cervical region 08/28/2014   DDD (degenerative disc disease), cervical 08/28/2014   Insomnia 04/30/2014   Peripheral neuropathy 04/02/2013   Herniated disc 01/29/2013   BPH (benign prostatic hyperplasia) 12/14/2011   ERECTILE DYSFUNCTION, ORGANIC 07/23/2010   THYROID NODULE, LEFT  05/23/2007   Hyperlipidemia 05/17/2006   HYPERTENSION, BENIGN SYSTEMIC 05/17/2006   Past Medical History:  Diagnosis Date   BPH (benign prostatic hyperplasia)    Bronchiectasis with acute lower respiratory infection (HCC) 08/13/2019   Bronchitis    inhaler prn   Cancer (HCC)    skin cancer?   CKD (chronic kidney disease) stage 3, GFR 30-59 ml/min (HCC)    ED (erectile dysfunction)    GERD (gastroesophageal reflux disease)    HLD (hyperlipidemia)    Hypertension    Hyperthyroidism    Multinodular thyroid    Pneumonia    gets recurrent PNA in left lung, recent dx 08/13/19 left lower lobe   Seasonal allergies     Family History  Problem Relation Age of Onset   Dementia Mother    Healthy Father     Past Surgical History:  Procedure Laterality Date   BACK SURGERY  1980s   [facility]   COLONOSCOPY  2004, 2012   several - polyps   MULTIPLE TOOTH EXTRACTIONS     TOTAL HIP ARTHROPLASTY Left 08/24/2019   Procedure: LEFT TOTAL HIP ARTHROPLASTY-DIRECT ANTERIOR;  Surgeon: Eldred Manges, MD;  Location: [facility] OR;  Service: Orthopedics;  Laterality: Left;   TOTAL HIP ARTHROPLASTY Right 08/26/2023   Procedure: RIGHT TOTAL HIP ARTHROPLASTY;  Surgeon: Eldred Manges, MD;  Location: [facility] OR;  Service: Orthopedics;  Laterality: Right;  Needs RNFA  Social History   Occupational History   Occupation: Ecologist: [employer]    Comment: Retired    Comment: Part-time  Tobacco Use   Smoking status: Never   Smokeless tobacco: Never  Vaping Use   Vaping status: Never Used  Substance and Sexual Activity   Alcohol use: No   Drug use: No   Sexual activity: Not on file

## 2023-10-05 ENCOUNTER — Other Ambulatory Visit: Payer: Self-pay | Admitting: Family Medicine

## 2023-10-05 DIAGNOSIS — E291 Testicular hypofunction: Secondary | ICD-10-CM

## 2023-10-10 ENCOUNTER — Telehealth: Payer: Self-pay | Admitting: Orthopaedic Surgery

## 2023-10-10 NOTE — Telephone Encounter (Signed)
 Patient called. Would like a return to work note. Would like it emailed to him [email or web address]

## 2023-11-02 DIAGNOSIS — E041 Nontoxic single thyroid nodule: Secondary | ICD-10-CM | POA: Diagnosis not present

## 2023-11-02 DIAGNOSIS — E0500 Thyrotoxicosis with diffuse goiter without thyrotoxic crisis or storm: Secondary | ICD-10-CM | POA: Diagnosis not present

## 2023-11-02 DIAGNOSIS — E0590 Thyrotoxicosis, unspecified without thyrotoxic crisis or storm: Secondary | ICD-10-CM | POA: Diagnosis not present

## 2023-11-07 ENCOUNTER — Ambulatory Visit (INDEPENDENT_AMBULATORY_CARE_PROVIDER_SITE_OTHER): Payer: Medicare HMO | Admitting: Family Medicine

## 2023-11-07 ENCOUNTER — Encounter: Payer: Self-pay | Admitting: Family Medicine

## 2023-11-07 VITALS — BP 126/74 | HR 84 | Ht 72.0 in | Wt 156.0 lb

## 2023-11-07 DIAGNOSIS — Z96641 Presence of right artificial hip joint: Secondary | ICD-10-CM | POA: Diagnosis not present

## 2023-11-07 DIAGNOSIS — J470 Bronchiectasis with acute lower respiratory infection: Secondary | ICD-10-CM | POA: Diagnosis not present

## 2023-11-07 DIAGNOSIS — I10 Essential (primary) hypertension: Secondary | ICD-10-CM | POA: Diagnosis not present

## 2023-11-07 DIAGNOSIS — F5101 Primary insomnia: Secondary | ICD-10-CM

## 2023-11-07 DIAGNOSIS — N1831 Chronic kidney disease, stage 3a: Secondary | ICD-10-CM

## 2023-11-07 DIAGNOSIS — R7301 Impaired fasting glucose: Secondary | ICD-10-CM | POA: Diagnosis not present

## 2023-11-07 DIAGNOSIS — E291 Testicular hypofunction: Secondary | ICD-10-CM | POA: Diagnosis not present

## 2023-11-07 LAB — POCT GLYCOSYLATED HEMOGLOBIN (HGB A1C): Hemoglobin A1C: 5.3 % (ref 4.0–5.6)

## 2023-11-07 NOTE — Assessment & Plan Note (Signed)
 Is really doing great with his recovery.

## 2023-11-07 NOTE — Assessment & Plan Note (Signed)
 He is a little behind on his follow-up with a nephrologist but plans on getting in soon.

## 2023-11-07 NOTE — Assessment & Plan Note (Addendum)
 That he is still applying the testosterone gel.  Due to recheck CBC and PSA.  Still currently on topical testosterone gel.

## 2023-11-07 NOTE — Progress Notes (Signed)
 Established Patient Office Visit  Subjective  Patient ID: Victor Price, male    DOB: [birth year]  Age: 73 y.o. MRN: [id number]  Chief Complaint  Patient presents with  . Hypertension  . ifg    HPI  Is doing well taking his medications regularly has Marcelline Deist is costing him a little over $400 every 3 months.  He did have his right hip surgery replacement and has done well he says it has been a little bit more sore than when he had the left one done but he is otherwise doing well.  Recently followed up with endocrinology for hyperthyroidism.  They are checking labs to see if they may need to make any adjustments to his regimen.    ROS    Objective:     BP 126/74   Pulse 84   Ht 6' (1.829 m)   Wt 156 lb (70.8 kg)   SpO2 100%   BMI 21.16 kg/m    Physical Exam Vitals and nursing note reviewed.  Constitutional:      Appearance: Normal appearance.  HENT:     Head: Normocephalic and atraumatic.  Eyes:     Conjunctiva/sclera: Conjunctivae normal.  Cardiovascular:     Rate and Rhythm: Normal rate and regular rhythm.  Pulmonary:     Effort: Pulmonary effort is normal.     Breath sounds: Normal breath sounds.     Comments: Prolonged expiration  Skin:    General: Skin is warm and dry.  Neurological:     Mental Status: He is alert.  Psychiatric:        Mood and Affect: Mood normal.     Results for orders placed or performed in visit on 11/07/23  POCT HgB A1C  Result Value Ref Range   Hemoglobin A1C 5.3 4.0 - 5.6 %   HbA1c POC (<> result, manual entry)     HbA1c, POC (prediabetic range)     HbA1c, POC (controlled diabetic range)        The 10-year ASCVD risk score (Arnett DK, et al., 2019) is: 17.3%    Assessment & Plan:   Problem List Items Addressed This Visit       Cardiovascular and Mediastinum   HYPERTENSION, BENIGN SYSTEMIC   Relevant Orders   PSA   CBC with Differential/Platelet   Lipid panel   Urine Microalbumin w/creat. ratio    Testosterone, Free, Total, SHBG     Respiratory   Bronchiectasis (HCC)   Currently on Anoro Ellipta.  He says he always hears a little bit more congestion in his chest later in the evenings.        Endocrine   IFG (impaired fasting glucose) - Primary   A1c looks phenomenal today on current regimen of Farxiga.  A1c is down to 5.3 continue current regimen and follow-up in 6 months.      Relevant Orders   POCT HgB A1C (Completed)   PSA   CBC with Differential/Platelet   Lipid panel   Urine Microalbumin w/creat. ratio   Testosterone, Free, Total, SHBG   Hypogonadism in male   That he is still applying the testosterone gel.  Due to recheck CBC and PSA.  Still currently on topical testosterone gel.      Relevant Orders   PSA   CBC with Differential/Platelet   Lipid panel   Urine Microalbumin w/creat. ratio   Testosterone, Free, Total, SHBG     Genitourinary   CKD (chronic kidney disease) stage 3, GFR  30-59 ml/min [facility])   He is a little behind on his follow-up with a nephrologist but plans on getting in soon.      Relevant Orders   PSA   CBC with Differential/Platelet   Lipid panel   Urine Microalbumin w/creat. ratio   Testosterone, Free, Total, SHBG     Other   S/P total right hip arthroplasty   Is really doing great with his recovery.      Insomnia   Using trazodone for sleep.         No follow-ups on file.    Nani Gasser, MD

## 2023-11-07 NOTE — Assessment & Plan Note (Signed)
 Currently on Anoro Ellipta.  He says he always hears a little bit more congestion in his chest later in the evenings.

## 2023-11-07 NOTE — Assessment & Plan Note (Signed)
Using trazodone for sleep.

## 2023-11-07 NOTE — Assessment & Plan Note (Signed)
 A1c looks phenomenal today on current regimen of Farxiga.  A1c is down to 5.3 continue current regimen and follow-up in 6 months.

## 2023-11-08 LAB — CBC WITH DIFFERENTIAL/PLATELET
Basophils Absolute: 0 10*3/uL (ref 0.0–0.2)
Basos: 1 %
EOS (ABSOLUTE): 0.2 10*3/uL (ref 0.0–0.4)
Eos: 3 %
Hematocrit: 38.9 % (ref 37.5–51.0)
Hemoglobin: 13.2 g/dL (ref 13.0–17.7)
Immature Grans (Abs): 0 10*3/uL (ref 0.0–0.1)
Immature Granulocytes: 0 %
Lymphocytes Absolute: 1.5 10*3/uL (ref 0.7–3.1)
Lymphs: 23 %
MCH: 31.1 pg (ref 26.6–33.0)
MCHC: 33.9 g/dL (ref 31.5–35.7)
MCV: 92 fL (ref 79–97)
Monocytes Absolute: 0.7 10*3/uL (ref 0.1–0.9)
Monocytes: 12 %
Neutrophils Absolute: 3.9 10*3/uL (ref 1.4–7.0)
Neutrophils: 61 %
Platelets: 334 10*3/uL (ref 150–450)
RBC: 4.25 x10E6/uL (ref 4.14–5.80)
RDW: 13.2 % (ref 11.6–15.4)
WBC: 6.3 10*3/uL (ref 3.4–10.8)

## 2023-11-08 LAB — LIPID PANEL
Chol/HDL Ratio: 3 ratio (ref 0.0–5.0)
Cholesterol, Total: 267 mg/dL — ABNORMAL HIGH (ref 100–199)
HDL: 89 mg/dL (ref >39)
LDL Chol Calc (NIH): 167 mg/dL — ABNORMAL HIGH (ref 0–99)
Triglycerides: 69 mg/dL (ref 0–149)
VLDL Cholesterol Cal: 11 mg/dL (ref 5–40)

## 2023-11-08 LAB — MICROALBUMIN / CREATININE URINE RATIO
Creatinine, Urine: 79.5 mg/dL
Microalb/Creat Ratio: 144 mg/g{creat} — ABNORMAL HIGH (ref 0–29)
Microalbumin, Urine: 114.6 ug/mL

## 2023-11-08 LAB — PSA: Prostate Specific Ag, Serum: 1.1 ng/mL (ref 0.0–4.0)

## 2023-11-09 ENCOUNTER — Encounter: Payer: Self-pay | Admitting: Family Medicine

## 2023-11-09 NOTE — Progress Notes (Signed)
 Hi Evander, total cholesterol and LDL are elevated.  Your cardiovascular risk score is quite high around 19%.  So I definitely want to get you back on a statin.  If you do not want to take Lipitor I am happy for [name] to try something else we do not have to continue with that 1.  If you want to retry the Lipitor that is fine to just let me know but we really do need to get you back on a statin since your risk is the most 20% for heart attack or stroke in the next 10 years.  Blood count is normal testosterone total looks good still waiting for free testosterone.  You are still spilling some protein in the urine.  Prostate test was 1.1, which is stable.   The 10-year ASCVD risk score (Arnett DK, et al., 2019) is: 19.6%   Values used to calculate the score:     Age: 73 years     Sex: Male     Is Non-Hispanic African American: No     Diabetic: No     Tobacco smoker: No     Systolic Blood Pressure: 126 mmHg     Is BP treated: Yes     HDL Cholesterol: 89 mg/dL     Total Cholesterol: 267 mg/dL

## 2023-11-12 LAB — TESTOSTERONE, FREE, TOTAL, SHBG
Sex Hormone Binding: 45.1 nmol/L (ref 19.3–76.4)
Testosterone, Free: 8.2 pg/mL (ref 6.6–18.1)
Testosterone: 527 ng/dL (ref 264–916)

## 2023-11-15 ENCOUNTER — Other Ambulatory Visit: Payer: Self-pay | Admitting: Family Medicine

## 2023-11-15 DIAGNOSIS — J470 Bronchiectasis with acute lower respiratory infection: Secondary | ICD-10-CM

## 2023-11-15 NOTE — Progress Notes (Signed)
 Hi Victor Price, testosterone level looks okay.

## 2023-11-17 ENCOUNTER — Other Ambulatory Visit: Payer: Self-pay | Admitting: Family Medicine

## 2023-11-17 DIAGNOSIS — R351 Nocturia: Secondary | ICD-10-CM

## 2023-12-16 ENCOUNTER — Other Ambulatory Visit: Payer: Self-pay | Admitting: Family Medicine

## 2023-12-16 DIAGNOSIS — J470 Bronchiectasis with acute lower respiratory infection: Secondary | ICD-10-CM

## 2023-12-19 ENCOUNTER — Telehealth: Payer: Self-pay

## 2023-12-19 ENCOUNTER — Other Ambulatory Visit (HOSPITAL_COMMUNITY): Payer: Self-pay

## 2023-12-19 NOTE — Telephone Encounter (Signed)
 Alternative Requested:THE PRESCRIBED MEDICATION IS NOT COVERED BY INSURANCE. PLEASE CONSIDER CHANGING TO ONE OF THE SUGGESTED COVERED ALTERNATIVES.   All Pharmacy Suggested Alternatives:  umeclidinium-vilanterol (ANORO ELLIPTA ) 62.5-25 MCG/ACT AEPB Glycopyrrolate-Formoterol (BEVESPI AEROSPHERE) 9-4.8 MCG/ACT AERO    Rx auth team: PA required for Anoro ellipta . Thanks in advance.

## 2023-12-19 NOTE — Telephone Encounter (Signed)
 Pharmacy Patient Advocate Encounter   Received notification from RX Request Messages that prior authorization for Anoro Ellipta  62.5-25mcg is required/requested.   Insurance verification completed.   The patient is insured through [employer] .   Per test claim: The current 30 day co-pay is, $120.34.  No PA needed at this time. This test claim was processed through [facility]- copay amounts may vary at other pharmacies due to pharmacy/plan contracts, or as the patient moves through the different stages of their insurance plan.     Spoke with the pharmacist at [facility] and they will have to order the medication for tomorrow.

## 2023-12-19 NOTE — Telephone Encounter (Signed)
 Good morning. The pharmacist was able to get it to go through on his insurance but will have to order for tomorrow. There is a new telephone encounter for it.

## 2023-12-20 ENCOUNTER — Other Ambulatory Visit: Payer: Self-pay | Admitting: *Deleted

## 2023-12-20 DIAGNOSIS — J470 Bronchiectasis with acute lower respiratory infection: Secondary | ICD-10-CM

## 2023-12-20 MED ORDER — ANORO ELLIPTA 62.5-25 MCG/ACT IN AEPB: 1.0000 | INHALATION_SPRAY | Freq: Every day | RESPIRATORY_TRACT | 1 refills | Status: AC

## 2024-01-03 ENCOUNTER — Ambulatory Visit (INDEPENDENT_AMBULATORY_CARE_PROVIDER_SITE_OTHER): Payer: Medicare HMO

## 2024-01-03 VITALS — Ht 72.0 in | Wt 160.0 lb

## 2024-01-03 DIAGNOSIS — Z0000 Encounter for general adult medical examination without abnormal findings: Secondary | ICD-10-CM | POA: Diagnosis not present

## 2024-01-03 NOTE — Progress Notes (Signed)
 Subjective:   Victor Price is a 73 y.o. male who presents for Medicare Annual/Subsequent preventive examination.  Visit Complete: Virtual I connected with  Victor Price on 01/03/24 by a audio enabled telemedicine application and verified that I am speaking with the correct person using two identifiers.  Patient Location: Home  Provider Location: Office/Clinic  I discussed the limitations of evaluation and management by telemedicine. The patient expressed understanding and agreed to proceed.  Vital Signs: Because this visit was a virtual/telehealth visit, some criteria may be missing or patient reported. Any vitals not documented were not able to be obtained and vitals that have been documented are patient reported.  Patient Medicare AWV questionnaire was completed by the patient on 12/30/2023; I have confirmed that all information answered by patient is correct and no changes since this date.  Cardiac Risk Factors include: advanced age (>69men, >64 women);hypertension;male gender;dyslipidemia     Objective:    Today's Vitals   01/03/24 1522  Weight: 160 lb (72.6 kg)  Height: 6' (1.829 m)   Body mass index is 21.7 kg/m.     01/03/2024    3:31 PM 08/24/2023    1:33 PM 06/26/2023    2:18 PM 12/27/2022    4:02 PM 05/05/2022    2:26 PM 12/21/2021    3:32 PM 12/01/2020    1:11 PM  Advanced Directives  Does Patient Have a Medical Advance Directive? Yes No No Yes Yes Yes Yes  Type of Advance Directive Living will;Healthcare Power of Attorney   Living will Living will Living will Healthcare Power of [place];Living will  Does patient want to make changes to medical advance directive? No - Patient declined   No - Patient declined  No - Patient declined No - Patient declined  Copy of Healthcare Power of Attorney in Chart? No - copy requested      No - copy requested  Would patient like information on creating a medical advance directive?  No - Patient declined         Current  Medications (verified) Outpatient Encounter Medications as of 01/03/2024  Medication Sig   bisoprolol -hydrochlorothiazide  (ZIAC ) 10-6.25 MG tablet TAKE 1 TABLET BY MOUTH EVERY DAY   [facility] ALLERGY  RELIEF-D 10-240 MG 24 hr tablet TAKE 1 TABLET BY MOUTH EVERY DAY   FARXIGA  10 MG TABS tablet TAKE 1 TABLET BY MOUTH EVERY DAY   gabapentin  (NEURONTIN ) 300 MG capsule Take 1 capsule (300 mg total) by mouth 2 (two) times daily.   meclizine  (ANTIVERT ) 25 MG tablet Take 1 tablet (25 mg total) by mouth 3 (three) times daily as needed for dizziness.   methimazole  (TAPAZOLE ) 10 MG tablet TAKE 1 TABLET BY MOUTH TWICE A DAY   sildenafil  (REVATIO ) 20 MG tablet TAKE 2 TO 5 TABLETS BY MOUTH DAILY AS NEEDED   tamsulosin  (FLOMAX ) 0.4 MG CAPS capsule TAKE 1 CAPSULE (0.4 MG TOTAL) BY MOUTH IN THE MORNING AND AT BEDTIME   Testosterone  10 MG/ACT (2%) GEL APPLY 1 PUMP TO AN INNER THIGH AND 2 PUMPS TO OPPOSITE INNER THIGH IN AM. FORTESTA .   traZODone  (DESYREL ) 50 MG tablet TAKE 1/2 TO 2 TABLETS BY MOUTH AT BEDTIME AS NEEDED SLEEP (Patient taking differently: Take 100 mg by mouth at bedtime.)   triamcinolone  cream (KENALOG ) 0.1 % Apply 1 Application topically 2 (two) times daily as needed (Rash).   umeclidinium-vilanterol (ANORO ELLIPTA ) 62.5-25 MCG/ACT AEPB Inhale 1 puff into the lungs daily.   atorvastatin  (LIPITOR) 40 MG tablet TAKE 1  TABLET BY MOUTH EVERYDAY AT BEDTIME (Patient not taking: Reported on 01/03/2024)   sodium chloride  HYPERTONIC 3 % nebulizer solution Inhale 4mL via nebulizer up to 2 times daily as needed to loosen congestion (Patient not taking: Reported on 01/03/2024)   [DISCONTINUED] Lidocaine -Hydrocortisone  Ace (ANAMANTLE HC) 3-0.5 % KIT Apply 1 dose rectally up to  Bid prn for spasm.   [DISCONTINUED] oxyCODONE -acetaminophen  (PERCOCET) 5-325 MG tablet Take 1-2 tablets by mouth every 4 (four) hours as needed for severe pain (pain score 7-10).   [DISCONTINUED] tiZANidine  (ZANAFLEX ) 4 MG tablet Take 1 tablet (4  mg total) by mouth every 8 (eight) hours as needed for muscle spasms.   No facility-administered encounter medications on file as of 01/03/2024.    Allergies (verified) Losartan  and Metoprolol    History: Past Medical History:  Diagnosis Date   BPH (benign prostatic hyperplasia)    Bronchiectasis with acute lower respiratory infection (HCC) 08/13/2019   Bronchitis    inhaler prn   Cancer (HCC)    skin cancer?   CKD (chronic kidney disease) stage 3, GFR 30-59 ml/min (HCC)    ED (erectile dysfunction)    GERD (gastroesophageal reflux disease)    HLD (hyperlipidemia)    Hypertension    Hyperthyroidism    Multinodular thyroid     Pneumonia    gets recurrent PNA in left lung, recent dx 08/13/19 left lower lobe   Seasonal allergies    Past Surgical History:  Procedure Laterality Date   BACK SURGERY  1980s   [facility]   COLONOSCOPY  2004, 2012   several - polyps   MULTIPLE TOOTH EXTRACTIONS     TOTAL HIP ARTHROPLASTY Left 08/24/2019   Procedure: LEFT TOTAL HIP ARTHROPLASTY-DIRECT ANTERIOR;  Surgeon: Adah Acron, MD;  Location: [facility] OR;  Service: Orthopedics;  Laterality: Left;   TOTAL HIP ARTHROPLASTY Right 08/26/2023   Procedure: RIGHT TOTAL HIP ARTHROPLASTY;  Surgeon: Adah Acron, MD;  Location: [facility] OR;  Service: Orthopedics;  Laterality: Right;  Needs RNFA   Family History  Problem Relation Age of Onset   Dementia Mother    Healthy Father    Social History   Socioeconomic History   Marital status: Married    Spouse name: Not on file   Number of children: 0   Years of education: 14   Highest education level: Associate degree: occupational, Scientist, product/process development, or vocational program  Occupational History   Occupation: [employer]    Employer: [employer]    Comment: Retired    Comment: Part-time  Tobacco Use   Smoking status: Never   Smokeless tobacco: Never  Vaping Use   Vaping status: Never Used  Substance and Sexual Activity   Alcohol use: No   Drug use: No   Sexual  activity: Not on file  Other Topics Concern   Not on file  Social History Narrative   Lives with his domestic partner. Works part-time. 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      Social Drivers of Corporate investment banker Strain: Low Risk  (01/03/2024)   Overall Financial Resource Strain (CARDIA)    Difficulty of Paying Living Expenses: Not hard at all  Food Insecurity: No Food Insecurity (01/03/2024)   Hunger Vital Sign    Worried About Running Out of Food in the Last Year: Never true    Ran Out of Food in the Last Year: Never true  Transportation Needs: No Transportation Needs (01/03/2024)   PRAPARE - Transportation    Lack of  Transportation (Medical): No    Lack of Transportation (Non-Medical): No  Physical Activity: Sufficiently Active (01/03/2024)   Exercise Vital Sign    Days of Exercise per Week: 4 days    Minutes of Exercise per Session: 60 min  Stress: No Stress Concern Present (01/03/2024)   [employer] of Occupational Health - Occupational Stress Questionnaire    Feeling of Stress : Not at all  Social Connections: Moderately Integrated (01/03/2024)   Social Connection and Isolation Panel [NHANES]    Frequency of Communication with Friends and Family: More than three times a week    Frequency of Social Gatherings with Friends and Family: Twice a week    Attends Religious Services: More than 4 times per year    Active Member of [employer] or Organizations: No    Attends Banker Meetings: Never    Marital Status: Living with partner  Recent Concern: Social Connections - Moderately Isolated (11/06/2023)   Social Connection and Isolation Panel [NHANES]    Frequency of Communication with Friends and Family: Once a week    Frequency of Social Gatherings with Friends and Family: Once a week    Attends Religious Services: More than 4 times per year    Active Member of [employer] or Organizations: No    Attends Engineer, structural: Not on file    Marital  Status: Living with partner    Tobacco Counseling Counseling given: Not Answered   Clinical Intake:  Pre-visit preparation completed: Yes  Pain : No/denies pain     BMI - recorded: 27.7 Nutritional Status: BMI of 19-24  Normal Nutritional Risks: None Diabetes: No  How often do you need to have someone help you when you read instructions, pamphlets, or other written materials from your doctor or pharmacy?: 1 - Never What is the last grade level you completed in school?: 14  Interpreter Needed?: No      Activities of Daily Living    01/03/2024    3:23 PM 12/27/2023    7:55 PM  In your present state of health, do you have any difficulty performing the following activities:  Hearing? 0 0  Vision? 0 0  Difficulty concentrating or making decisions? 0   Walking or climbing stairs? 0 0  Dressing or bathing? 0 0  Doing errands, shopping? 0 0  Preparing Food and eating ? N N  Using the Toilet? N   In the past six months, have you accidently leaked urine? N   Do you have problems with loss of bowel control? N   Managing your Medications? N N  Managing your Finances? N N  Housekeeping or managing your Housekeeping? N N    Patient Care Team: Victor Draft, MD as PCP - General Lucienne Ryder Jeanella Milan, MD as Consulting Physician (Orthopedic Surgery)  Indicate any recent Medical Services you may have received from other than [facility] providers in the past year (date may be approximate).     Assessment:   This is a routine wellness examination for Victor Price.  Hearing/Vision screen No results found.   Goals Addressed             This Visit's Progress    Patient Stated       Patient states he would like to continue a healthy lifestyle.       Depression Screen    01/03/2024    3:30 PM 12/27/2022    4:02 PM 03/23/2022    1:19 PM 01/27/2022    1:21  PM 12/21/2021    3:34 PM 12/01/2020    1:12 PM 09/10/2020    9:54 AM  PHQ 2/9 Scores  PHQ - 2 Score 0 0 0 0 0 0 0     Fall Risk    01/03/2024    3:31 PM 12/27/2023    7:55 PM 12/27/2022    4:02 PM 05/05/2022    2:25 PM 01/27/2022    1:21 PM  Fall Risk   Falls in the past year? 0 0 0 0 0  Number falls in past yr: 0  0 0 0  Injury with Fall? 0  0 0 0  Risk for fall due to : No Fall Risks  No Fall Risks  No Fall Risks  Follow up Falls evaluation completed  Falls evaluation completed  Falls evaluation completed    MEDICARE RISK AT HOME: Medicare Risk at Home Any stairs in or around the home?: Yes If so, are there any without handrails?: Yes Home free of loose throw rugs in walkways, pet beds, electrical cords, etc?: No Adequate lighting in your home to reduce risk of falls?: Yes Life alert?: No Use of a cane, walker or w/c?: No Grab bars in the bathroom?: No Shower chair or bench in shower?: No Elevated toilet seat or a handicapped toilet?: No  TIMED UP AND GO:  Was the test performed?  No    Cognitive Function:        01/03/2024    3:31 PM 12/27/2022    4:06 PM 12/21/2021    3:38 PM 12/01/2020    1:16 PM  6CIT Screen  What Year? 0 points 0 points 0 points 0 points  What month? 0 points 0 points 0 points 0 points  What time? 0 points 0 points 0 points 0 points  Count back from 20 0 points 0 points 0 points 0 points  Months in reverse 0 points 0 points 0 points 0 points  Repeat phrase 0 points 0 points 0 points 0 points  Total Score 0 points 0 points 0 points 0 points    Immunizations Immunization History  Administered Date(s) Administered   Fluad Quad(high Dose 65+) 04/09/2020, 05/11/2021, 06/03/2022   Fluad Trivalent(High Dose 65+) 05/09/2023   Influenza Split 05/17/2012   Influenza Whole 05/10/2005, 04/22/2011   Influenza, High Dose Seasonal PF 03/28/2019, 08/22/2022   Influenza,inj,Quad PF,6+ Mos 04/02/2013, 05/28/2015   Influenza-Unspecified 04/22/2016, 04/30/2017, 05/16/2018   PFIZER(Purple Top)SARS-COV-2 Vaccination 10/01/2019, 10/22/2019   PNEUMOCOCCAL CONJUGATE-20  05/11/2021   Pneumococcal Polysaccharide-23 05/10/2005   Respiratory Syncytial Virus Vaccine,Recomb Aduvanted(Arexvy) 06/03/2022   Td 08/11/1998   Tdap 11/02/2011, 05/10/2023   Zoster Recombinant(Shingrix) 12/30/2019, 02/29/2020   Zoster, Live 11/19/2011    TDAP status: Up to date  Flu Vaccine status: Up to date  Pneumococcal vaccine status: Up to date  Covid-19 vaccine status: Completed vaccines  Qualifies for Shingles Vaccine? Yes   Zostavax completed Yes   Shingrix Completed?: Yes  Screening Tests Health Maintenance  Topic Date Due   Hepatitis C Screening  Never done   COVID-19 Vaccine (3 - Pfizer risk series) 11/19/2019   INFLUENZA VACCINE  03/09/2024   Medicare Annual Wellness (AWV)  01/02/2025   Colonoscopy  12/31/2026   DTaP/Tdap/Td (4 - Td or Tdap) 05/09/2033   Pneumonia Vaccine 17+ Years old  Completed   Zoster Vaccines- Shingrix  Completed   HPV VACCINES  Aged Out   Meningococcal B Vaccine  Aged Out    Health Maintenance  Health Maintenance Due  Topic Date Due   Hepatitis C Screening  Never done   COVID-19 Vaccine (3 - Pfizer risk series) 11/19/2019    Colorectal cancer screening: Type of screening: Colonoscopy. Completed 12/30/2021. Repeat every 5 years  Lung Cancer Screening: (Low Dose CT Chest recommended if Age 101-80 years, 20 pack-year currently smoking OR have quit w/in 15years.) does not qualify.   Lung Cancer Screening Referral: n/a  Additional Screening:  Hepatitis C Screening: does qualify; Completed   Vision Screening: Recommended annual ophthalmology exams for early detection of glaucoma and other disorders of the eye. Is the patient up to date with their annual eye exam?  Yes  Who is the provider or what is the name of the office in which the patient attends annual eye exams? Dr April Knack If pt is not established with a provider, would they like to be referred to a provider to establish care? N/a.   Dental Screening: Recommended annual  dental exams for proper oral hygiene    Community Resource Referral / Chronic Care Management: CRR required this visit?  No   CCM required this visit?  No     Plan:     I have personally reviewed and noted the following in the patient's chart:   Medical and social history Use of alcohol, tobacco or illicit drugs  Current medications and supplements including opioid prescriptions. Patient is not currently taking opioid prescriptions. Functional ability and status Nutritional status Physical activity Advanced directives List of other physicians Hospitalizations, surgeries # 1, and ER # 1 visits in previous 12 months Vitals Screenings to include cognitive, depression, and falls Referrals and appointments  In addition, I have reviewed and discussed with patient certain preventive protocols, quality metrics, and best practice recommendations. A written personalized care plan for preventive services as well as general preventive health recommendations were provided to patient.     Victor Price, CMA   01/03/2024   After Visit Summary: (MyChart) Due to this being a telephonic visit, the after visit summary with patients personalized plan was offered to patient via MyChart   Nurse Notes:   Victor Price is a 73 y.o. male patient of [name], Corita Diego, MD who had a Medicare Annual Wellness Visit today via telephone. Dennie is Working part time and lives with their partner. he does not have any children. He reports that he is socially active and does interact with friends/family regularly. He is moderately physically active and enjoys yard work.

## 2024-01-03 NOTE — Patient Instructions (Signed)
  Victor Price , Thank you for taking time to come for your Medicare Wellness Visit. I appreciate your ongoing commitment to your health goals. Please review the following plan we discussed and let me know if I can assist you in the future.   These are the goals we discussed:  Goals       Patient Stated (pt-stated)      12/01/2020 AWV Goal: Improved Nutrition/Diet  Patient will verbalize understanding that diet plays an important role in overall health and that a poor diet is a risk factor for many chronic medical conditions.  Over the next year, patient will improve self management of their diet by incorporating eat 6 small meals per day. Patient will utilize available community resources to help with food acquisition if needed (ex: food pantries, [address], etc) Patient will work with nutrition specialist if a referral was made       Patient Stated (pt-stated)      Continue to stay healthy and active.      Patient Stated (pt-stated)      Patient stated that he would like to continue to maintain his current healthy lifestyle.      Patient Stated      Patient states he would like to continue a healthy lifestyle.         This is a list of the screening recommended for you and due dates:  Health Maintenance  Topic Date Due   Hepatitis C Screening  Never done   COVID-19 Vaccine (3 - Pfizer risk series) 11/19/2019   Flu Shot  03/09/2024   Medicare Annual Wellness Visit  01/02/2025   Colon Cancer Screening  12/31/2026   DTaP/Tdap/Td vaccine (4 - Td or Tdap) 05/09/2033   Pneumonia Vaccine  Completed   Zoster (Shingles) Vaccine  Completed   HPV Vaccine  Aged Out   Meningitis B Vaccine  Aged Out

## 2024-01-05 ENCOUNTER — Ambulatory Visit: Payer: Self-pay

## 2024-01-05 NOTE — Telephone Encounter (Signed)
 Chief Complaint: fall, hip injury Symptoms: bruising Frequency: 2 days ago Pertinent Negatives: Patient denies pain, difficulty ambulating Disposition: [] ED /[] Urgent Care (no appt availability in office) / [] Appointment(In office/virtual)/ []  [name]/ [] Home Care/ [] Refused Recommended Disposition /[] [facility]/ []  Follow-up with PCP Additional Notes:  Victor Price 2 days ago. Left hip replaced 3 years ago. Left hip has large bruise that is black and blue and goes to spine. He fell from 6 feet in height, refused to go into detail. Hit head but no loss of consciousness. He wants xray for reassurance.  Acute evaluation advised but none are available, suggested urgent care for evaluation but patient refuses and would like to be worked into [facility] [employer] or other provider schedule today or tomorrow. Please follow up and advise.   Copied from CRM [id number]. Topic: Appointments - Appointment Scheduling >> Jan 05, 2024 10:03 AM [name] wrote: Patient fell on left hip 2 days ago. Skin is black blue and orange where he experienced the fall. No appointments with Dr. Greer Leak until July 10th. Reason for Disposition  [1] Large swelling or bruise (> 2 inches or 5 cm) AND [2] able to bear weight  Protocols used: Hip Injury-A-AH

## 2024-01-05 NOTE — Telephone Encounter (Signed)
 No appointment availabilities for [facility] / [place] today. Please advise, thanks.

## 2024-01-05 NOTE — Telephone Encounter (Signed)
 OK to double book for 1 PM tomorrow

## 2024-01-06 ENCOUNTER — Ambulatory Visit: Admitting: Family Medicine

## 2024-01-06 ENCOUNTER — Ambulatory Visit

## 2024-01-06 ENCOUNTER — Encounter: Payer: Self-pay | Admitting: Family Medicine

## 2024-01-06 VITALS — BP 155/76 | HR 93

## 2024-01-06 DIAGNOSIS — M25552 Pain in left hip: Secondary | ICD-10-CM

## 2024-01-06 DIAGNOSIS — Z96642 Presence of left artificial hip joint: Secondary | ICD-10-CM | POA: Diagnosis not present

## 2024-01-06 DIAGNOSIS — I10 Essential (primary) hypertension: Secondary | ICD-10-CM

## 2024-01-06 DIAGNOSIS — S0990XA Unspecified injury of head, initial encounter: Secondary | ICD-10-CM

## 2024-01-06 DIAGNOSIS — M47816 Spondylosis without myelopathy or radiculopathy, lumbar region: Secondary | ICD-10-CM | POA: Diagnosis not present

## 2024-01-06 DIAGNOSIS — G5793 Unspecified mononeuropathy of bilateral lower limbs: Secondary | ICD-10-CM

## 2024-01-06 DIAGNOSIS — Z471 Aftercare following joint replacement surgery: Secondary | ICD-10-CM | POA: Diagnosis not present

## 2024-01-06 DIAGNOSIS — W19XXXA Unspecified fall, initial encounter: Secondary | ICD-10-CM | POA: Diagnosis not present

## 2024-01-06 DIAGNOSIS — M51369 Other intervertebral disc degeneration, lumbar region without mention of lumbar back pain or lower extremity pain: Secondary | ICD-10-CM | POA: Diagnosis not present

## 2024-01-06 DIAGNOSIS — J3489 Other specified disorders of nose and nasal sinuses: Secondary | ICD-10-CM

## 2024-01-06 MED ORDER — IPRATROPIUM BROMIDE 0.03 % NA SOLN: 2.0000 | Freq: Two times a day (BID) | NASAL | 12 refills | Status: DC | PRN

## 2024-01-06 MED ORDER — GABAPENTIN 600 MG PO TABS: 600.0000 mg | ORAL_TABLET | Freq: Two times a day (BID) | ORAL | 3 refills | Status: DC

## 2024-01-06 NOTE — Assessment & Plan Note (Signed)
 Recommend a trial of ipratropium nasal spray to use as needed especially if he is gena be out in public.

## 2024-01-06 NOTE — Progress Notes (Signed)
 Acute Office Visit  Subjective:     Patient ID: Victor Price, male    DOB: October 07, 1950, 73 y.o.   MRN: [id number]  Chief Complaint  Patient presents with   Hip Injury    HPI Patient is in today for [place] 3 days ago. Left hip replaced 3 years ago. Left hip has large bruise that is black and blue and goes to spine. He fell from 6 feet in height, refused to go into detail. Hit head but no loss of consciousness. He wants xray for reassurance. He can move hip without difficulty.    Takes gabapentin  600mg  BID for numbness and tingling in hands and feet. His physician retired and needs new rx.    Would like to try the nasal spray for his nose that runs when eats.    ROS      Objective:    BP (!) 155/76   Pulse 93   SpO2 99%    Physical Exam Vitals reviewed.  Constitutional:      Appearance: Normal appearance.  HENT:     Head: Normocephalic.  Pulmonary:     Effort: Pulmonary effort is normal.  Skin:    Comments: Large contusion over the left hip, buttock and lumbar spine over old incision.   Abrasion on left side of scalp.   Neurological:     Mental Status: He is alert and oriented to person, place, and time.  Psychiatric:        Mood and Affect: Mood normal.        Behavior: Behavior normal.     No results found for any visits on 01/06/24.      Assessment & Plan:   Problem List Items Addressed This Visit       Cardiovascular and Mediastinum   HYPERTENSION, BENIGN SYSTEMIC   Blood pressure elevated.  Recommend recheck in a couple of weeks.        Other   Gustatory Rhinorrhea   Recommend a trial of ipratropium nasal spray to use as needed especially if he is gena be out in public.      Relevant Medications   ipratropium (ATROVENT) 0.03 % nasal spray   Other Visit Diagnoses       Left hip pain    -  Primary   Relevant Orders   DG Hip Unilat W OR W/O Pelvis 2-3 Views Left   DG Lumbar Spine Complete     Injury of head, initial encounter        Relevant Orders   DG Lumbar Spine Complete     Fall, initial encounter       Relevant Orders   DG Lumbar Spine Complete     Neuropathy of both feet       Relevant Medications   gabapentin  (NEURONTIN ) 600 MG tablet      Left lateral hip pain with significant bruising.  He has good range of motion of the hip no limitations but with hardware they are with having had hip replacement about 3 years ago we will get plain films just to rule out any injury to the hardware.  He also had significant bruising over his old lumbar spine incision so I felt like he needed a lumbar film as well even though he has not been having a lot of pain in that area.  He is not currently taking medication for pain.  Is have an abrasion on his scalp but appears to be healing well no sign of infection.  Meds ordered this encounter  Medications   gabapentin  (NEURONTIN ) 600 MG tablet    Sig: Take 1 tablet (600 mg total) by mouth 2 (two) times daily.    Dispense:  180 tablet    Refill:  3   ipratropium (ATROVENT) 0.03 % nasal spray    Sig: Place 2 sprays into both nostrils 2 (two) times daily as needed for rhinitis. For runny nose    Dispense:  30 mL    Refill:  12    No follow-ups on file.  Duaine German, MD

## 2024-01-06 NOTE — Assessment & Plan Note (Signed)
 Blood pressure elevated.  Recommend recheck in a couple of weeks.

## 2024-01-11 ENCOUNTER — Telehealth: Payer: Self-pay

## 2024-01-11 NOTE — Telephone Encounter (Signed)
 Patient called - requesting x-ray results.  Informed the patient about the radiology shortage.  Called radiology reading room and requesting two x-rays lumbar and hip be expedited.

## 2024-01-12 ENCOUNTER — Ambulatory Visit: Payer: Self-pay | Admitting: Family Medicine

## 2024-01-12 NOTE — Progress Notes (Signed)
 HI [place] your xray of hip looks ok too. No injury to hardware. I hope you are icing and feeling better.  If not continuing to improved please let me know

## 2024-01-12 NOTE — Progress Notes (Signed)
 Hi Victor Price, your xray of your spine shows no fracture, just arthritis.

## 2024-01-14 ENCOUNTER — Other Ambulatory Visit: Payer: Self-pay | Admitting: Family Medicine

## 2024-01-14 DIAGNOSIS — E0590 Thyrotoxicosis, unspecified without thyrotoxic crisis or storm: Secondary | ICD-10-CM

## 2024-01-25 ENCOUNTER — Encounter: Admitting: Orthopaedic Surgery

## 2024-01-28 ENCOUNTER — Telehealth: Admitting: Physician Assistant

## 2024-01-28 DIAGNOSIS — J101 Influenza due to other identified influenza virus with other respiratory manifestations: Secondary | ICD-10-CM | POA: Diagnosis not present

## 2024-01-28 MED ORDER — BENZONATATE 100 MG PO CAPS: 100.0000 mg | ORAL_CAPSULE | Freq: Two times a day (BID) | ORAL | 0 refills | Status: DC | PRN

## 2024-01-28 MED ORDER — FLUTICASONE PROPIONATE 50 MCG/ACT NA SUSP: 2.0000 | Freq: Every day | NASAL | 6 refills | Status: DC

## 2024-01-28 MED ORDER — OSELTAMIVIR PHOSPHATE 75 MG PO CAPS: 75.0000 mg | ORAL_CAPSULE | Freq: Two times a day (BID) | ORAL | 0 refills | Status: DC

## 2024-01-28 NOTE — Progress Notes (Signed)
 I have spent 5 minutes in review of e-visit questionnaire, review and updating patient chart, medical decision making and response to patient.   Laure Kidney, PA-C

## 2024-01-28 NOTE — Progress Notes (Signed)
 E visit for Flu symptoms   We are sorry that you are not feeling well.  Here is how we plan to help! Based on what you have shared with me it looks like you may have a respiratory virus that may be influenza.  Influenza or "the flu" is   an infection caused by a respiratory virus. The flu virus is highly contagious and persons who did not receive their yearly flu vaccination may "catch" the flu from close contact.  We have anti-viral medications to treat the viruses that cause this infection. They are not a "cure" and only shorten the course of the infection. These prescriptions are most effective when they are given within the first 2 days of "flu" symptoms. Antiviral medication are indicated if you have a high risk of complications from the flu. You should  also consider an antiviral medication if you are in close contact with someone who is at risk. These medications can help patients avoid complications from the flu  but have side effects that you should know. Possible side effects from Tamiflu or oseltamivir include nausea, vomiting, diarrhea, dizziness, headaches, eye redness, sleep problems or other respiratory symptoms. You should not take Tamiflu if you have an allergy  to oseltamivir or any to the ingredients in Tamiflu.  Based upon your symptoms and potential risk factors I have prescribed Oseltamivir (Tamiflu).  It has been sent to your designated pharmacy.  You will take one 75 mg capsule orally twice a day for the next 5 days., I have prescribed Tessalon  Perles 100 mg. You may take 1-2 capsules every 8 hours as needed for cough, and I have prescribed Fluticasone nasal spray 2 sprays in each nostril one time per day  We have anti-viral medications to treat the viruses that cause this infection. They are not a "cure" and only shorten the course of the infection. These prescriptions are most effective when they are given within the first 2 days of "flu" symptoms. Antiviral medication is  indicated if you have a high risk of complications from the flu. You should also consider an antiviral medication if you are in close contact with someone who is at risk. These medications can help patients avoid complications from the flu but have side effects that you should know. Possible side effects from Tamiflu or oseltamivir include nausea, vomiting, diarrhea, dizziness, headaches, eye redness, sleep problems or other respiratory symptoms.  You should not take Tamiflu if you have an allergy  to oseltamivir or any to the ingredients in Tamiflu.  Based upon your symptoms and potential risk factors I have prescribed Oseltamivir (Tamiflu).  It has been sent to your designated pharmacy.  You will take one 75 mg capsule orally twice a day for the next 5 days., I have prescribed Tessalon  Perles 100 mg. You may take 1-2 capsules every 8 hours as needed for cough, and I have prescribed Fluticasone nasal spray 2 sprays in each nostril one time per day    You are to isolate at home until you have been fever-free for at least 24 hours without a fever-reducing medication, and symptoms have been steadily improving for 24 hours. At that time, you can end isolation but need to mask for an additional 5 days.  If you must be around other household members who do not have symptoms, you need to make sure that both you and the family members are masking consistently with a high-quality mask.  If you note any worsening of symptoms despite treatment, please seek  an in-person evaluation ASAP. If you note any significant shortness of breath or any chest pain, please seek ED evaluation. Please do not delay care!  Go to the nearest hospital ED for assessment if fever/cough/breathlessness are severe or illness seems like a threat to life.    The following symptoms may appear 2-14 days after exposure: Fever Cough Shortness of breath or difficulty breathing Chills Repeated shaking with chills Muscle pain Headache Sore  throat New loss of taste or smell Fatigue Congestion or runny nose Nausea or vomiting Diarrhea  You can use medication such as I have prescribed Tessalon  Perles 100 mg. You may take 1-2 capsules every 8 hours as needed for cough  For nasal congestion, you may use an oral decongestant such as Mucinex D or if you have glaucoma or high blood pressure use plain Mucinex.  Saline nasal spray or nasal drops can help and can safely be used as often as needed for congestion.  For your congestion, I have prescribed Fluticasone nasal spray one spray in each nostril twice a day  If you have a sore or scratchy throat, use a saltwater gargle-  to  teaspoon of salt dissolved in a 4-ounce to 8-ounce glass of warm water.  Gargle the solution for approximately 15-30 seconds and then spit.  It is important not to swallow the solution.  You can also use throat lozenges/cough drops and Chloraseptic spray to help with throat pain or discomfort.  Warm or cold liquids can also be helpful in relieving throat pain.  For headache, pain or general discomfort, you can use Ibuprofen or Tylenol  as directed.   Some authorities believe that zinc sprays or the use of Echinacea may shorten the course of your symptoms.  ANYONE WHO HAS FLU SYMPTOMS SHOULD:  Stay home. The flu is highly contagious and going out or to work exposes others!  Be sure to drink plenty of fluids. Water is fine as well as fruit juices, sodas and electrolyte beverages. You may want to stay       away from caffeine or alcohol. If you are nauseated, try taking small sips of liquids. How do you know if you are getting enough fluid? Your urine should be a pale yellow or almost colorless.  Get rest.  Taking a steamy shower or using a humidifier may help nasal congestion and ease sore throat pain. Using a saline nasal spray works much the same way.  Cough drops, hard candies and sore throat lozenges may ease your cough.  Line up a caregiver. Have someone  check on you regularly.  GET HELP RIGHT AWAY IF YOU HAVE EMERGENCY WARNING SIGNS - CALL 911 or proceed to your closest emergency facility if:  You develop worsening high fever. Trouble breathing Bluish lips or face Persistent pain or pressure in the chest New confusion Inability to wake or stay awake You cough up blood. Your symptoms become more severe Inability to hold down food or fluids  MAKE SURE YOU Understand these instructions. Will watch your condition. Will get help right away if you are not doing well or get worse.  Your e-visit answers were reviewed by a board certified advanced clinical practitioner to complete your personal care plan.  Depending on the condition, your plan could have included both over the counter or prescription medications. If there is a problem, please reply once you have received a response from your provider. Your safety is important to us .  If you have drug allergies, check your prescription carefully.  You can use MyChart to ask questions about today's visit, request a non-urgent call back, or ask for a work or school excuse for 24 hours related to this e-Visit. If it has been greater than 24 hours you will need to follow up with your provider or enter a new e-Visit to address those concerns. You will get an e-mail in the next two days asking about your experience.  I hope that your e-visit has been valuable and will speed up your recovery. Thank you for using E-visits!

## 2024-02-01 ENCOUNTER — Telehealth: Payer: Self-pay

## 2024-02-01 ENCOUNTER — Encounter: Payer: Self-pay | Admitting: Family Medicine

## 2024-02-01 ENCOUNTER — Telehealth: Admitting: Family Medicine

## 2024-02-01 VITALS — Temp 101.8°F

## 2024-02-01 DIAGNOSIS — B9689 Other specified bacterial agents as the cause of diseases classified elsewhere: Secondary | ICD-10-CM

## 2024-02-01 DIAGNOSIS — J208 Acute bronchitis due to other specified organisms: Secondary | ICD-10-CM | POA: Diagnosis not present

## 2024-02-01 DIAGNOSIS — J470 Bronchiectasis with acute lower respiratory infection: Secondary | ICD-10-CM

## 2024-02-01 MED ORDER — PREDNISONE 20 MG PO TABS: ORAL_TABLET | ORAL | 0 refills | Status: AC

## 2024-02-01 MED ORDER — AMOXICILLIN-POT CLAVULANATE 875-125 MG PO TABS
1.0000 | ORAL_TABLET | Freq: Two times a day (BID) | ORAL | 0 refills | Status: DC
Start: 2024-02-01 — End: 2024-05-08

## 2024-02-01 NOTE — Telephone Encounter (Signed)
 Copied from CRM [id number]. Topic: Clinical - Red Word Triage >> Feb 01, 2024 12:12 PM Miquel SAILOR wrote: Red Word that prompted transfer to Nurse Triage: MRN: [id number] has pneumonia has had it 14 times before has a little trouble breathing. Denied NT. Just wanted to send message to PCP for medication for this issue. I fault it should be sent to NT to assist. Call back [phone]

## 2024-02-01 NOTE — Assessment & Plan Note (Signed)
 Complicated by acute infection today.

## 2024-02-01 NOTE — Telephone Encounter (Signed)
 Spoke with patient  Patient states he has had possible fevers, cough producing large amount of green mucus  Shortness of breath, Chills Fatigue Patient states last week Particia was positive for flu - he had an 3-visit to start on Tamiflu so his symptoms initially were not bad Stats this awalys causes his lungs to fill up  States has had  pneumonia over 12 times in his life and feels certain this is what is happening  He states Dr. Alvan usually gives him abx and prednisone  and symptoms usually resolve.

## 2024-02-01 NOTE — Progress Notes (Signed)
 Virtual Visit via Video Note  I connected with Victor Price on 02/01/24 at  3:00 PM EDT by a video enabled telemedicine application and verified that I am speaking with the correct person using two identifiers.   I discussed the limitations of evaluation and management by telemedicine and the availability of in person appointments. The patient expressed understanding and agreed to proceed.  Patient location: at home Provider location: in office  Subjective:    CC:   Chief Complaint  Patient presents with   Cough    HPI:  Patient states 4 days he has had possible fevers, cough producing large amount of green mucus, Shortness of breath, Chills,  Fatigue. He did a e visit and started the Tamiflu on Thru or Friday about 2 days before his sxs started.  No blood in the sputum.  Green and thick   Patient states last week Particia (his wife)  was positive for flu - he had an 3-visit to start on Tamiflu so his symptoms initially were not bad. States this awalys causes his lungs to fill up   States has had  pneumonia over 12 times in his life and feels certain this is what is happening    Past medical history, Surgical history, Family history not pertinant except as noted below, Social history, Allergies, and medications have been entered into the medical record, reviewed, and corrections made.    Objective:    General: Speaking clearly in complete sentences without any shortness of breath.  Alert and oriented x3.  Normal judgment. No apparent acute distress.    Impression and Recommendations:    Problem List Items Addressed This Visit       Respiratory   Bronchiectasis (HCC)   Complicated by acute infection today.      Other Visit Diagnoses       Acute bacterial bronchitis    -  Primary      He started Tamiflu 5 days ago in fact he only has 1 dose he started about 2 to 3 days before his symptoms started he is gotten rapidly worse with copious production of green  sputum, shortness of breath and fever today.  Will go ahead and treat with Augmentin  and prednisone .  We did discuss though that if he is not improving pretty quickly or if he feels like he is getting worse then he needs to absolutely go to an urgent care and be evaluated and probably have a chest x-ray done at that point.  Urged him to really push fluids.  No orders of the defined types were placed in this encounter.   Meds ordered this encounter  Medications   amoxicillin -clavulanate (AUGMENTIN ) 875-125 MG tablet    Sig: Take 1 tablet by mouth 2 (two) times daily.    Dispense:  14 tablet    Refill:  0   predniSONE  (DELTASONE ) 20 MG tablet    Sig: Take 2 tablets (40 mg total) by mouth daily with breakfast for 4 days, THEN 1 tablet (20 mg total) daily with breakfast for 4 days, THEN 0.5 tablets (10 mg total) daily with breakfast for 4 days.    Dispense:  14 tablet    Refill:  0     I discussed the assessment and treatment plan with the patient. The patient was provided an opportunity to ask questions and all were answered. The patient agreed with the plan and demonstrated an understanding of the instructions.   The patient was advised to call  back or seek an in-person evaluation if the symptoms worsen or if the condition fails to improve as anticipated.   Dorothyann Byars, MD

## 2024-02-01 NOTE — Telephone Encounter (Signed)
 Copied from CRM [id number]. Topic: General - Other >> Feb 01, 2024 12:55 PM Victor Price wrote: Reason for CRM: MRN: [id number] has pneumonia has had it 14 times before has a little trouble breathing. Denied NT. Just wanted to send message to PCP for medication for this issue. I fault it should be sent to NT to assist. Call back [phone]

## 2024-02-05 ENCOUNTER — Other Ambulatory Visit: Payer: Self-pay | Admitting: Family Medicine

## 2024-02-05 DIAGNOSIS — N529 Male erectile dysfunction, unspecified: Secondary | ICD-10-CM

## 2024-02-13 ENCOUNTER — Other Ambulatory Visit: Payer: Self-pay | Admitting: Family Medicine

## 2024-02-13 DIAGNOSIS — G4700 Insomnia, unspecified: Secondary | ICD-10-CM

## 2024-02-23 ENCOUNTER — Other Ambulatory Visit: Payer: Self-pay | Admitting: Family Medicine

## 2024-02-23 DIAGNOSIS — E785 Hyperlipidemia, unspecified: Secondary | ICD-10-CM

## 2024-02-23 DIAGNOSIS — I10 Essential (primary) hypertension: Secondary | ICD-10-CM

## 2024-02-28 ENCOUNTER — Other Ambulatory Visit: Payer: Self-pay | Admitting: Family Medicine

## 2024-02-28 DIAGNOSIS — G5793 Unspecified mononeuropathy of bilateral lower limbs: Secondary | ICD-10-CM

## 2024-02-28 DIAGNOSIS — E291 Testicular hypofunction: Secondary | ICD-10-CM

## 2024-02-28 MED ORDER — GABAPENTIN 600 MG PO TABS: 600.0000 mg | ORAL_TABLET | Freq: Two times a day (BID) | ORAL | 1 refills | Status: DC

## 2024-02-28 MED ORDER — TESTOSTERONE 10 MG/ACT (2%) TD GEL: TRANSDERMAL | 0 refills | Status: DC

## 2024-02-28 NOTE — Telephone Encounter (Signed)
 Copied from CRM #[id number]. Topic: Clinical - Medication Refill >> Feb 28, 2024  3:56 PM Cherylann S wrote: Medication: Testosterone  10 MG/ACT (2%) GEL  Has the patient contacted their pharmacy? Yes (Agent: If no, request that the patient contact the pharmacy for the refill. If patient does not wish to contact the pharmacy document the reason why and proceed with request.) (Agent: If yes, when and what did the pharmacy advise?)  This is the patient's preferred pharmacy:  [phone] - [place], [place] - [address] [place] [address] Phone: [phone] Fax: [phone]  Is this the correct pharmacy for this prescription? Yes If no, delete pharmacy and type the correct one.   Has the prescription been filled recently? No  Is the patient out of the medication? Yes  Has the patient been seen for an appointment in the last year OR does the patient have an upcoming appointment? Yes  Can we respond through MyChart? Yes  Agent: Please be advised that Rx refills may take up to 3 business days. We ask that you follow-up with your pharmacy.

## 2024-03-08 ENCOUNTER — Other Ambulatory Visit: Payer: Self-pay | Admitting: Family Medicine

## 2024-03-08 DIAGNOSIS — I10 Essential (primary) hypertension: Secondary | ICD-10-CM

## 2024-03-08 DIAGNOSIS — E785 Hyperlipidemia, unspecified: Secondary | ICD-10-CM

## 2024-04-16 DIAGNOSIS — N1831 Chronic kidney disease, stage 3a: Secondary | ICD-10-CM | POA: Diagnosis not present

## 2024-05-08 ENCOUNTER — Encounter: Payer: Self-pay | Admitting: Family Medicine

## 2024-05-08 ENCOUNTER — Ambulatory Visit (INDEPENDENT_AMBULATORY_CARE_PROVIDER_SITE_OTHER): Admitting: Family Medicine

## 2024-05-08 VITALS — BP 128/68 | HR 86 | Ht 72.0 in | Wt 170.0 lb

## 2024-05-08 DIAGNOSIS — E291 Testicular hypofunction: Secondary | ICD-10-CM

## 2024-05-08 DIAGNOSIS — E785 Hyperlipidemia, unspecified: Secondary | ICD-10-CM

## 2024-05-08 DIAGNOSIS — Z23 Encounter for immunization: Secondary | ICD-10-CM

## 2024-05-08 DIAGNOSIS — N529 Male erectile dysfunction, unspecified: Secondary | ICD-10-CM | POA: Diagnosis not present

## 2024-05-08 DIAGNOSIS — R7301 Impaired fasting glucose: Secondary | ICD-10-CM | POA: Diagnosis not present

## 2024-05-08 DIAGNOSIS — N1831 Chronic kidney disease, stage 3a: Secondary | ICD-10-CM | POA: Diagnosis not present

## 2024-05-08 DIAGNOSIS — J470 Bronchiectasis with acute lower respiratory infection: Secondary | ICD-10-CM

## 2024-05-08 DIAGNOSIS — I10 Essential (primary) hypertension: Secondary | ICD-10-CM

## 2024-05-08 LAB — POCT GLYCOSYLATED HEMOGLOBIN (HGB A1C): Hemoglobin A1C: 5.3 % (ref 4.0–5.6)

## 2024-05-08 MED ORDER — SILDENAFIL CITRATE 20 MG PO TABS
ORAL_TABLET | ORAL | 5 refills | Status: DC
Start: 2024-05-08 — End: 2024-06-05

## 2024-05-08 MED ORDER — TESTOSTERONE 10 MG/ACT (2%) TD GEL: TRANSDERMAL | 0 refills | Status: DC

## 2024-05-08 NOTE — Assessment & Plan Note (Signed)
 BP at goal. Dur for labs.

## 2024-05-08 NOTE — Assessment & Plan Note (Signed)
 He has noticed a huge difference in cough and mucous production.

## 2024-05-08 NOTE — Assessment & Plan Note (Signed)
 Managed with Farxiga , well-tolerated. Nephrologist recommends quarterly follow-up. - Continue Farxiga  until year-end, reassess insurance for Farxiga  vs. Jardiance in January. - Follow up with nephrologist every three months. - Encourage increased water intake.

## 2024-05-08 NOTE — Progress Notes (Signed)
 Established Patient Office Visit  Subjective  Patient ID: Victor Price, male    DOB: August 31, 1950  Age: 73 y.o. MRN: [id number]  Chief Complaint  Patient presents with   Medical Management of Chronic Issues    HPI  Discussed the use of AI scribe software for clinical note transcription with the patient, who gave verbal consent to proceed.  History of Present Illness JOHAAN RYSER is a 73 year old male who presents with persistent hip pain and lung issues.  Left hip pain - Persistent soreness in the left hip since July after jumping off the back of a trailer - Pain described as deep and sore - Requires adjustments in leg extension to alleviate pressure - Pain has persisted for several months  Pulmonary symptoms - Lung issues have improved significantly over the past two weeks - Previously experienced inflammation, cracking, popping, and coughing in the lungs - Currently no cracking, popping, or coughing - No limitations in activities due to breathing issues - Able to go to the gym three times a week - Attributes improvement to taking 5 mL of black seed oil and two tablets of sildenafil  (40mg  total)  and serrapeptase each morning  Medication tolerance and side effects - Currently taking two tablets of sildenafil  daily in the morning - No side effects such as flushing or lightheadedness from current medication regimen  CKD 3  - On Farxiga  as prescribed by kidney doctor with no problems - Farxiga  is currently covered due to meeting deductible - Consuming meat with nitrates causes sinus congestion, so he avoids such foods  Hydration status - Difficulty maintaining adequate hydration - Often forgets to drink water throughout the day, especially when busy - Attempts to carry a water bottle to remind himself to drink more regularly  Recently saw his nephrologist, [facility] N[facility] [place] on September 8.  Nephrology did start him on Farxiga  to  reduce risk for progression.  And stressed the importance of keeping blood pressure under maximal control.  I did recommend that he avoid NSAIDs.  They also discussed potentially discrete case seeing or discontinuing his Protonix .    ROS    Objective:     BP 128/68   Pulse 86   Ht 6' (1.829 m)   Wt 170 lb (77.1 kg)   SpO2 100%   BMI 23.06 kg/m    Physical Exam Vitals and nursing note reviewed.  Constitutional:      Appearance: Normal appearance.  HENT:     Head: Normocephalic and atraumatic.  Eyes:     Conjunctiva/sclera: Conjunctivae normal.  Cardiovascular:     Rate and Rhythm: Normal rate and regular rhythm.  Pulmonary:     Effort: Pulmonary effort is normal.     Breath sounds: Normal breath sounds. No wheezing or rhonchi.     Comments: Coarse BS bilaterally Skin:    General: Skin is warm and dry.  Neurological:     Mental Status: He is alert.  Psychiatric:        Mood and Affect: Mood normal.      Results for orders placed or performed in visit on 05/08/24  POCT HgB A1C  Result Value Ref Range   Hemoglobin A1C 5.3 4.0 - 5.6 %   HbA1c POC (<> result, manual entry)     HbA1c, POC (prediabetic range)     HbA1c, POC (controlled diabetic range)        The 10-year ASCVD risk score (Arnett DK, et  al., 2019) is: 20.1%    Assessment & Plan:   Problem List Items Addressed This Visit       Cardiovascular and Mediastinum   HYPERTENSION, BENIGN SYSTEMIC - Primary   BP at goal. Dur for labs.       Relevant Medications   sildenafil  (REVATIO ) 20 MG tablet   Other Relevant Orders   CMP14+EGFR   CBC with Differential/Platelet   TSH     Respiratory   Bronchiectasis (HCC)   He has noticed a huge difference in cough and mucous production.          Endocrine   IFG (impaired fasting glucose)    A1c stable at 5.3.      Relevant Orders   POCT HgB A1C (Completed)   CMP14+EGFR   CBC with Differential/Platelet   TSH   Hypogonadism in male   Refilled  testosterone  today. Labs are up to date      Relevant Medications   Testosterone  10 MG/ACT (2%) GEL     Genitourinary   CKD (chronic kidney disease) stage 3, GFR 30-59 ml/min (HCC)   Managed with Farxiga , well-tolerated. Nephrologist recommends quarterly follow-up. - Continue Farxiga  until year-end, reassess insurance for Farxiga  vs. Jardiance in January. - Follow up with nephrologist every three months. - Encourage increased water intake.      Relevant Orders   CMP14+EGFR   CBC with Differential/Platelet   TSH   Urine Microalbumin w/creat. ratio     Other   Hyperlipidemia   Reports he is off the atorvastatin .        Relevant Medications   sildenafil  (REVATIO ) 20 MG tablet   Other Relevant Orders   CMP14+EGFR   CBC with Differential/Platelet   TSH   ERECTILE DYSFUNCTION, ORGANIC   Relevant Medications   sildenafil  (REVATIO ) 20 MG tablet   Other Visit Diagnoses       Encounter for immunization       Relevant Orders   Flu vaccine HIGH DOSE PF(Fluzone Trivalent) (Completed)      Assessment and Plan Assessment & Plan Bronchiectasis Significant symptom improvement reported. - Reduce sphenofil to one tablet daily, monitor effects. - Research black seed oil benefits and side effects.  Left hip pain Persistent since July, worsens with certain activities. - Modify exercises to reduce hip pressure.  General Health Maintenance Flu shot administered. - Perform blood work, last done six months ago.   Return in about 6 months (around 11/05/2024) for Hypertension.    Dorothyann Byars, MD

## 2024-05-08 NOTE — Assessment & Plan Note (Signed)
 Refilled testosterone  today. Labs are up to date

## 2024-05-08 NOTE — Assessment & Plan Note (Signed)
 A1c stable at 5.3.

## 2024-05-08 NOTE — Assessment & Plan Note (Signed)
 Reports he is off the atorvastatin .

## 2024-05-09 DIAGNOSIS — E0500 Thyrotoxicosis with diffuse goiter without thyrotoxic crisis or storm: Secondary | ICD-10-CM | POA: Diagnosis not present

## 2024-05-09 DIAGNOSIS — Z1330 Encounter for screening examination for mental health and behavioral disorders, unspecified: Secondary | ICD-10-CM | POA: Diagnosis not present

## 2024-05-09 DIAGNOSIS — E041 Nontoxic single thyroid nodule: Secondary | ICD-10-CM | POA: Diagnosis not present

## 2024-05-09 LAB — CBC WITH DIFFERENTIAL/PLATELET
Basophils Absolute: 0 x10E3/uL (ref 0.0–0.2)
Basos: 1 %
EOS (ABSOLUTE): 0.2 x10E3/uL (ref 0.0–0.4)
Eos: 3 %
Hematocrit: 39.4 % (ref 37.5–51.0)
Hemoglobin: 13.1 g/dL (ref 13.0–17.7)
Immature Grans (Abs): 0 x10E3/uL (ref 0.0–0.1)
Immature Granulocytes: 0 %
Lymphocytes Absolute: 1.5 x10E3/uL (ref 0.7–3.1)
Lymphs: 27 %
MCH: 31 pg (ref 26.6–33.0)
MCHC: 33.2 g/dL (ref 31.5–35.7)
MCV: 93 fL (ref 79–97)
Monocytes Absolute: 0.8 x10E3/uL (ref 0.1–0.9)
Monocytes: 14 %
Neutrophils Absolute: 3.1 x10E3/uL (ref 1.4–7.0)
Neutrophils: 55 %
Platelets: 361 x10E3/uL (ref 150–450)
RBC: 4.22 x10E6/uL (ref 4.14–5.80)
RDW: 13.1 % (ref 11.6–15.4)
WBC: 5.6 x10E3/uL (ref 3.4–10.8)

## 2024-05-09 LAB — CMP14+EGFR
ALT: 23 IU/L (ref 0–44)
AST: 25 IU/L (ref 0–40)
Albumin: 4.5 g/dL (ref 3.8–4.8)
Alkaline Phosphatase: 70 IU/L (ref 47–123)
BUN/Creatinine Ratio: 12 (ref 10–24)
BUN: 28 mg/dL — ABNORMAL HIGH (ref 8–27)
Bilirubin Total: 0.4 mg/dL (ref 0.0–1.2)
CO2: 22 mmol/L (ref 20–29)
Calcium: 10.3 mg/dL — ABNORMAL HIGH (ref 8.6–10.2)
Chloride: 98 mmol/L (ref 96–106)
Creatinine, Ser: 2.26 mg/dL — ABNORMAL HIGH (ref 0.76–1.27)
Globulin, Total: 2.8 g/dL (ref 1.5–4.5)
Glucose: 87 mg/dL (ref 70–99)
Potassium: 4.9 mmol/L (ref 3.5–5.2)
Sodium: 138 mmol/L (ref 134–144)
Total Protein: 7.3 g/dL (ref 6.0–8.5)
eGFR: 30 mL/min/1.73 — ABNORMAL LOW (ref >59)

## 2024-05-09 LAB — MICROALBUMIN / CREATININE URINE RATIO
Creatinine, Urine: 90 mg/dL
Microalb/Creat Ratio: 130 mg/g{creat} — ABNORMAL HIGH (ref 0–29)
Microalbumin, Urine: 117.1 ug/mL

## 2024-05-09 LAB — TSH: TSH: 2.32 u[IU]/mL (ref 0.450–4.500)

## 2024-05-11 ENCOUNTER — Ambulatory Visit: Payer: Self-pay | Admitting: Family Medicine

## 2024-05-11 DIAGNOSIS — N1831 Chronic kidney disease, stage 3a: Secondary | ICD-10-CM

## 2024-05-11 NOTE — Progress Notes (Signed)
 Hi Victor Price, your kidney function jumped up significantly normally you are between 1.5 and 1.6 but this is the highest its ever been at 2.2.  Liver function looks good you are still spilling a little bit of protein in the urine but about the same as last time and blood count looks good.  Thyroid  also looks great.  My concern is the black seed oil I did look it up and it can cause renal impairment I know you just ordered it a couple of weeks ago but I want you to stop it completely and then I want to recheck your kidney function in about 10 days.  Also when she to increase your fluid intake a little bit to try to flush your system and keep your kidneys hydrated and lets see if it improves.  If not and we are gena need to do further workup.

## 2024-05-24 ENCOUNTER — Other Ambulatory Visit: Payer: Self-pay | Admitting: Family Medicine

## 2024-05-24 DIAGNOSIS — E785 Hyperlipidemia, unspecified: Secondary | ICD-10-CM

## 2024-05-24 DIAGNOSIS — I10 Essential (primary) hypertension: Secondary | ICD-10-CM

## 2024-05-24 NOTE — Telephone Encounter (Signed)
 Copied from CRM [id number]. Topic: Clinical - Medication Refill >> May 24, 2024  3:01 PM Berwyn MATSU wrote: Medication: bisoprolol -hydrochlorothiazide  (ZIAC ) 10-6.25 MG tablet   Has the patient contacted their pharmacy? Yes (Agent: If no, request that the patient contact the pharmacy for the refill. If patient does not wish to contact the pharmacy document the reason why and proceed with request.) (Agent: If yes, when and what did the pharmacy advise?)  This is the patient's preferred pharmacy:  [phone] [place], [place] - [address] [place] [address] Phone: [phone] Fax: [phone]  Is this the correct pharmacy for this prescription? Yes If no, delete pharmacy and type the correct one.   Has the prescription been filled recently? Yes  Is the patient out of the medication? No  Has the patient been seen for an appointment in the last year OR does the patient have an upcoming appointment? No  Can we respond through MyChart? Yes  Agent: Please be advised that Rx refills may take up to 3 business days. We ask that you follow-up with your pharmacy.

## 2024-05-25 ENCOUNTER — Other Ambulatory Visit: Payer: Self-pay | Admitting: Family Medicine

## 2024-05-25 ENCOUNTER — Ambulatory Visit
Admission: EM | Admit: 2024-05-25 | Discharge: 2024-05-25 | Disposition: A | Discharge status: Home / Self Care | Attending: Family Medicine | Admitting: Family Medicine

## 2024-05-25 ENCOUNTER — Encounter: Payer: Self-pay | Admitting: Emergency Medicine

## 2024-05-25 DIAGNOSIS — R079 Chest pain, unspecified: Secondary | ICD-10-CM | POA: Diagnosis not present

## 2024-05-25 DIAGNOSIS — I10 Essential (primary) hypertension: Secondary | ICD-10-CM

## 2024-05-25 DIAGNOSIS — E785 Hyperlipidemia, unspecified: Secondary | ICD-10-CM

## 2024-05-25 LAB — BMP8+EGFR
BUN/Creatinine Ratio: 11 (ref 10–24)
BUN: 22 mg/dL (ref 8–27)
CO2: 23 mmol/L (ref 20–29)
Calcium: 10.2 mg/dL (ref 8.6–10.2)
Chloride: 97 mmol/L (ref 96–106)
Creatinine, Ser: 2.06 mg/dL — ABNORMAL HIGH (ref 0.76–1.27)
Glucose: 106 mg/dL — ABNORMAL HIGH (ref 70–99)
Potassium: 4 mmol/L (ref 3.5–5.2)
Sodium: 138 mmol/L (ref 134–144)
eGFR: 34 mL/min/1.73 — ABNORMAL LOW (ref >59)

## 2024-05-25 NOTE — ED Provider Notes (Signed)
 Victor Price    CSN: [id number] Arrival date & time: 05/25/24  1626      History   Chief Complaint Chief Complaint  Patient presents with   Chest Pain    HPI Victor Price is a 73 y.o. male.   HPI pleasant 73 year old male presents with chest pain for several days.  Patient reports chest pain feels like person kneeling into his chest with both their knees.  PMH significant for HTN, HLD, and CKD stage III  Past Medical History:  Diagnosis Date   BPH (benign prostatic hyperplasia)    Bronchiectasis with acute lower respiratory infection (HCC) 08/13/2019   Bronchitis    inhaler prn   Cancer (HCC)    skin cancer?   CKD (chronic kidney disease) stage 3, GFR 30-59 ml/min (HCC)    ED (erectile dysfunction)    GERD (gastroesophageal reflux disease)    HLD (hyperlipidemia)    Hypertension    Hyperthyroidism    Multinodular thyroid     Pneumonia    gets recurrent PNA in left lung, recent dx 08/13/19 left lower lobe   Seasonal allergies     Patient Active Problem List   Diagnosis Date Noted   Gustatory Rhinorrhea 01/06/2024   S/P total right hip arthroplasty 08/26/2023   Pain in right hip 04/19/2023   IFG (impaired fasting glucose) 02/01/2023   Hemorrhoid 11/04/2022   Cystic acne 11/04/2022   Left inguinal hernia 01/27/2022   Carpal tunnel syndrome, left upper limb 12/04/2019   H/O total hip arthroplasty, left 10/19/2019   DDD (degenerative disc disease), lumbar 06/05/2019   Hypothyroidism 10/31/2017   Hypogonadism in male 10/31/2017   CKD (chronic kidney disease) stage 3, GFR 30-59 ml/min (HCC) 02/08/2017   Bronchiectasis (HCC) 07/20/2016   Radiculitis of right cervical region 08/28/2014   DDD (degenerative disc disease), cervical 08/28/2014   Insomnia 04/30/2014   Peripheral neuropathy 04/02/2013   Herniated disc 01/29/2013   BPH (benign prostatic hyperplasia) 12/14/2011   ERECTILE DYSFUNCTION, ORGANIC 07/23/2010   THYROID  NODULE, LEFT 05/23/2007    Hyperlipidemia 05/17/2006   HYPERTENSION, BENIGN SYSTEMIC 05/17/2006    Past Surgical History:  Procedure Laterality Date   BACK SURGERY  1980s   [facility]   COLONOSCOPY  2004, 2012   several - polyps   MULTIPLE TOOTH EXTRACTIONS     TOTAL HIP ARTHROPLASTY Left 08/24/2019   Procedure: LEFT TOTAL HIP ARTHROPLASTY-DIRECT ANTERIOR;  Surgeon: Barbarann Oneil BROCKS, MD;  Location: [facility] OR;  Service: Orthopedics;  Laterality: Left;   TOTAL HIP ARTHROPLASTY Right 08/26/2023   Procedure: RIGHT TOTAL HIP ARTHROPLASTY;  Surgeon: Barbarann Oneil BROCKS, MD;  Location: [facility] OR;  Service: Orthopedics;  Laterality: Right;  Needs RNFA       Home Medications    Prior to Admission medications   Medication Sig Start Date End Date Taking? Authorizing Provider  bisoprolol -hydrochlorothiazide  (ZIAC ) 10-6.25 MG tablet TAKE 1 TABLET BY MOUTH EVERY DAY 05/25/24  Yes [name], MD  [facility]  RELIEF-D 10-240 MG 24 hr tablet TAKE 1 TABLET BY MOUTH EVERY DAY 07/29/23  Yes [name], MD  FARXIGA  10 MG TABS tablet TAKE 1 TABLET BY MOUTH EVERY DAY 11/17/23  Yes [name], MD  fluticasone  (FLONASE ) 50 MCG/ACT nasal spray Place 2 sprays into both nostrils daily. 01/28/24  Yes McLean, Darius, PA-C  gabapentin  (NEURONTIN ) 600 MG tablet Take 1 tablet (600 mg total) by mouth 2 (two) times daily. 02/28/24  Yes [name], MD  ipratropium (ATROVENT ) 0.03 %  nasal spray Place 2 sprays into both nostrils 2 (two) times daily as needed for rhinitis. For runny nose 01/06/24  Yes [name], MD  methimazole  (TAPAZOLE ) 10 MG tablet TAKE 1 TABLET BY MOUTH TWICE A DAY 01/18/24  Yes [name], MD  sildenafil  (REVATIO ) 20 MG tablet TAKE 2 TO 5 TABLETS BY MOUTH DAILY AS NEEDED 05/08/24  Yes [name], MD  tamsulosin  (FLOMAX ) 0.4 MG CAPS capsule TAKE 1 CAPSULE (0.4 MG TOTAL) BY MOUTH IN THE MORNING AND AT BEDTIME 11/17/23  Yes [name], MD  Testosterone  10 MG/ACT (2%) GEL  APPLY 1 PUMP TO AN INNER THIGH AND 2 PUMPS TO OPPOSITE INNER THIGH IN AM. FORTESTA . 05/08/24  Yes [name], MD  traZODone  (DESYREL ) 50 MG tablet TAKE 1/2 TO 2 TABLETS BY MOUTH AT BEDTIME AS NEEDED SLEEP 02/13/24  Yes [name], MD  triamcinolone  cream (KENALOG ) 0.1 % Apply 1 Application topically 2 (two) times daily as needed (Rash). 10/19/21  Yes [provider]  umeclidinium-vilanterol (ANORO ELLIPTA ) 62.5-25 MCG/ACT AEPB Inhale 1 puff into the lungs daily. 12/20/23  Yes [name], MD    Family History Family History  Problem Relation Age of Onset   Dementia Mother    Healthy Father     Social History Social History   Tobacco Use   Smoking status: Never   Smokeless tobacco: Never  Vaping Use   Vaping status: Never Used  Substance Use Topics   Alcohol use: No   Drug use: No     Allergies   Losartan , Metoprolol , and Nsaids   Review of Systems Review of Systems  Cardiovascular:  Positive for chest pain.  All other systems reviewed and are negative.    Physical Exam Triage Vital Signs ED Triage Vitals  Encounter Vitals Group     BP 05/25/24 1633 (!) 167/93     Girls Systolic BP Percentile --      Girls Diastolic BP Percentile --      Boys Systolic BP Percentile --      Boys Diastolic BP Percentile --      Pulse Rate 05/25/24 1633 93     Resp 05/25/24 1633 20     Temp 05/25/24 1633 98.3 F (36.8 C)     Temp Source 05/25/24 1633 Oral     SpO2 05/25/24 1633 97 %     Weight 05/25/24 1632 170 lb (77.1 kg)     Height 05/25/24 1632 6' (1.829 m)     Head Circumference --      Peak Flow --      Pain Score 05/25/24 1632 0     Pain Loc --      Pain Education --      Exclude from Growth Chart --    No data found.  Updated Vital Signs BP (!) 167/93 (BP Location: Right Arm)   Pulse 93   Temp 98.3 F (36.8 C) (Oral)   Resp 20   Ht 6' (1.829 m)   Wt 170 lb (77.1 kg)   SpO2 97%   BMI 23.06 kg/m    Physical Exam Vitals  and nursing note reviewed.  Constitutional:      Appearance: Normal appearance. He is normal weight. He is not ill-appearing, toxic-appearing or diaphoretic.  HENT:     Head: Normocephalic and atraumatic.     Mouth/Throat:     Mouth: Mucous membranes are moist.     Pharynx: Oropharynx is clear.  Eyes:     Extraocular Movements: Extraocular movements intact.     Conjunctiva/sclera: Conjunctivae normal.     Pupils: Pupils are equal, round, and reactive to light.  Cardiovascular:     Rate and Rhythm: Normal rate and regular rhythm.     Pulses: Normal pulses.     Heart sounds: Normal heart sounds. No murmur heard.    No friction rub. No gallop.  Pulmonary:     Effort: Pulmonary effort is normal.     Breath sounds: Normal breath sounds. No wheezing, rhonchi or rales.  Musculoskeletal:        General: Normal range of motion.  Skin:    General: Skin is warm and dry.  Neurological:     General: No focal deficit present.     Mental Status: He is alert and oriented to person, place, and time. Mental status is at baseline.  Psychiatric:        Mood and Affect: Mood normal.        Behavior: Behavior normal.      UC Treatments / Results  Labs (all labs ordered are listed, but only abnormal results are displayed) Labs Reviewed - No data to display  EKG   Radiology No results found.  Procedures Procedures (including critical Price time)  Medications Ordered in UC Medications - No data to display  Initial Impression / Assessment and Plan / UC Course  I have reviewed the triage vital signs and the nursing notes.  Pertinent labs & imaging results that were available during my Price of the patient were reviewed by me and considered in my medical decision making (see chart for details).     MDM: 1.  Chest pain, unspecified type-EKG reveals a normal sinus rhythm exact in appearance when compared to 08/24/2023. Advised patient if chest pain worsens please go to [facility] ED for further evaluation/probable CT of chest  to rule out pulmonary embolism.  Patient discharged home, hemodynamically stable. Final Clinical Impressions(s) / UC Diagnoses   Final diagnoses:  Chest pain, unspecified type     Discharge Instructions      Advised patient if chest pain worsens please go to [facility] ED for further evaluation/probable CT of chest  to rule out pulmonary embolism.     ED Prescriptions   None    PDMP not reviewed this encounter.   Cortez, Flippen, FNP 05/25/24 [phone]

## 2024-05-25 NOTE — Progress Notes (Signed)
 Hi Victor Price, kidney function is still elevated again you normally around 1.7-1.9.  2 weeks ago it was 2.2 it does look a little better and you are definitely better hydrated it is now 2.0.  I want a make sure this continues to improve so would like to recheck your kidney function in about 3 to 4 weeks as well as get an ultrasound of your kidneys.

## 2024-05-25 NOTE — Discharge Instructions (Addendum)
 Advised patient if chest pain worsens please go to [facility] ED for further evaluation/probable CT of chest  to rule out pulmonary embolism.

## 2024-05-25 NOTE — ED Triage Notes (Signed)
 Patient c/o chest pain described as someone having their knees in his chest for several days.  Denies any SOB.

## 2024-05-26 ENCOUNTER — Inpatient Hospital Stay (HOSPITAL_BASED_OUTPATIENT_CLINIC_OR_DEPARTMENT_OTHER)
Admission: EM | Admit: 2024-05-26 | Discharge: 2024-06-05 | DRG: 233 | Disposition: A | Discharge status: Home / Self Care | Attending: Thoracic Surgery (Cardiothoracic Vascular Surgery) | Admitting: Thoracic Surgery (Cardiothoracic Vascular Surgery)

## 2024-05-26 ENCOUNTER — Other Ambulatory Visit: Payer: Self-pay

## 2024-05-26 ENCOUNTER — Emergency Department (HOSPITAL_BASED_OUTPATIENT_CLINIC_OR_DEPARTMENT_OTHER)

## 2024-05-26 ENCOUNTER — Encounter (HOSPITAL_BASED_OUTPATIENT_CLINIC_OR_DEPARTMENT_OTHER): Payer: Self-pay

## 2024-05-26 DIAGNOSIS — E875 Hyperkalemia: Secondary | ICD-10-CM | POA: Diagnosis not present

## 2024-05-26 DIAGNOSIS — Z96643 Presence of artificial hip joint, bilateral: Secondary | ICD-10-CM | POA: Diagnosis present

## 2024-05-26 DIAGNOSIS — M79601 Pain in right arm: Secondary | ICD-10-CM | POA: Diagnosis not present

## 2024-05-26 DIAGNOSIS — Z886 Allergy status to analgesic agent status: Secondary | ICD-10-CM

## 2024-05-26 DIAGNOSIS — K219 Gastro-esophageal reflux disease without esophagitis: Secondary | ICD-10-CM | POA: Diagnosis present

## 2024-05-26 DIAGNOSIS — Z820 Family history of epilepsy and other diseases of the nervous system: Secondary | ICD-10-CM

## 2024-05-26 DIAGNOSIS — Z951 Presence of aortocoronary bypass graft: Principal | ICD-10-CM

## 2024-05-26 DIAGNOSIS — R072 Precordial pain: Secondary | ICD-10-CM | POA: Diagnosis not present

## 2024-05-26 DIAGNOSIS — I214 Non-ST elevation (NSTEMI) myocardial infarction: Secondary | ICD-10-CM | POA: Diagnosis not present

## 2024-05-26 DIAGNOSIS — J479 Bronchiectasis, uncomplicated: Secondary | ICD-10-CM | POA: Diagnosis present

## 2024-05-26 DIAGNOSIS — N1832 Chronic kidney disease, stage 3b: Secondary | ICD-10-CM | POA: Diagnosis not present

## 2024-05-26 DIAGNOSIS — I130 Hypertensive heart and chronic kidney disease with heart failure and stage 1 through stage 4 chronic kidney disease, or unspecified chronic kidney disease: Secondary | ICD-10-CM | POA: Diagnosis present

## 2024-05-26 DIAGNOSIS — M79602 Pain in left arm: Secondary | ICD-10-CM | POA: Diagnosis not present

## 2024-05-26 DIAGNOSIS — I10 Essential (primary) hypertension: Secondary | ICD-10-CM | POA: Diagnosis not present

## 2024-05-26 DIAGNOSIS — N400 Enlarged prostate without lower urinary tract symptoms: Secondary | ICD-10-CM | POA: Diagnosis present

## 2024-05-26 DIAGNOSIS — I2510 Atherosclerotic heart disease of native coronary artery without angina pectoris: Secondary | ICD-10-CM | POA: Diagnosis present

## 2024-05-26 DIAGNOSIS — E0590 Thyrotoxicosis, unspecified without thyrotoxic crisis or storm: Secondary | ICD-10-CM | POA: Diagnosis present

## 2024-05-26 DIAGNOSIS — H539 Unspecified visual disturbance: Secondary | ICD-10-CM | POA: Diagnosis not present

## 2024-05-26 DIAGNOSIS — I5031 Acute diastolic (congestive) heart failure: Secondary | ICD-10-CM | POA: Diagnosis not present

## 2024-05-26 DIAGNOSIS — Z7951 Long term (current) use of inhaled steroids: Secondary | ICD-10-CM

## 2024-05-26 DIAGNOSIS — D62 Acute posthemorrhagic anemia: Secondary | ICD-10-CM | POA: Diagnosis not present

## 2024-05-26 DIAGNOSIS — Z8639 Personal history of other endocrine, nutritional and metabolic disease: Secondary | ICD-10-CM

## 2024-05-26 DIAGNOSIS — E876 Hypokalemia: Secondary | ICD-10-CM | POA: Diagnosis not present

## 2024-05-26 DIAGNOSIS — Z7902 Long term (current) use of antithrombotics/antiplatelets: Secondary | ICD-10-CM

## 2024-05-26 DIAGNOSIS — E039 Hypothyroidism, unspecified: Secondary | ICD-10-CM | POA: Diagnosis present

## 2024-05-26 DIAGNOSIS — Z888 Allergy status to other drugs, medicaments and biological substances status: Secondary | ICD-10-CM

## 2024-05-26 DIAGNOSIS — J3489 Other specified disorders of nose and nasal sinuses: Secondary | ICD-10-CM

## 2024-05-26 DIAGNOSIS — E785 Hyperlipidemia, unspecified: Secondary | ICD-10-CM | POA: Diagnosis present

## 2024-05-26 DIAGNOSIS — R7303 Prediabetes: Secondary | ICD-10-CM | POA: Diagnosis present

## 2024-05-26 DIAGNOSIS — E291 Testicular hypofunction: Secondary | ICD-10-CM

## 2024-05-26 DIAGNOSIS — I319 Disease of pericardium, unspecified: Secondary | ICD-10-CM | POA: Diagnosis not present

## 2024-05-26 DIAGNOSIS — I4891 Unspecified atrial fibrillation: Secondary | ICD-10-CM | POA: Diagnosis present

## 2024-05-26 DIAGNOSIS — Z79899 Other long term (current) drug therapy: Secondary | ICD-10-CM

## 2024-05-26 DIAGNOSIS — E871 Hypo-osmolality and hyponatremia: Secondary | ICD-10-CM | POA: Diagnosis not present

## 2024-05-26 DIAGNOSIS — R079 Chest pain, unspecified: Secondary | ICD-10-CM | POA: Diagnosis not present

## 2024-05-26 DIAGNOSIS — G5793 Unspecified mononeuropathy of bilateral lower limbs: Secondary | ICD-10-CM

## 2024-05-26 DIAGNOSIS — Z7982 Long term (current) use of aspirin: Secondary | ICD-10-CM

## 2024-05-26 DIAGNOSIS — G4700 Insomnia, unspecified: Secondary | ICD-10-CM | POA: Diagnosis present

## 2024-05-26 DIAGNOSIS — N179 Acute kidney failure, unspecified: Secondary | ICD-10-CM | POA: Diagnosis not present

## 2024-05-26 DIAGNOSIS — N529 Male erectile dysfunction, unspecified: Secondary | ICD-10-CM

## 2024-05-26 LAB — BASIC METABOLIC PANEL WITH GFR
Anion gap: 12 (ref 5–15)
BUN: 22 mg/dL (ref 8–23)
CO2: 25 mmol/L (ref 22–32)
Calcium: 9.9 mg/dL (ref 8.9–10.3)
Chloride: 100 mmol/L (ref 98–111)
Creatinine, Ser: 1.95 mg/dL — ABNORMAL HIGH (ref 0.61–1.24)
GFR, Estimated: 36 mL/min — ABNORMAL LOW (ref >60)
Glucose, Bld: 99 mg/dL (ref 70–99)
Potassium: 4 mmol/L (ref 3.5–5.1)
Sodium: 137 mmol/L (ref 135–145)

## 2024-05-26 LAB — HEPATIC FUNCTION PANEL
ALT: 18 U/L (ref 0–44)
AST: 25 U/L (ref 15–41)
Albumin: 4.3 g/dL (ref 3.5–5.0)
Alkaline Phosphatase: 75 U/L (ref 38–126)
Bilirubin, Direct: 0.2 mg/dL (ref 0.0–0.2)
Indirect Bilirubin: 0.2 mg/dL — ABNORMAL LOW (ref 0.3–0.9)
Total Bilirubin: 0.4 mg/dL (ref 0.0–1.2)
Total Protein: 7.2 g/dL (ref 6.5–8.1)

## 2024-05-26 LAB — CBC
HCT: 39 % (ref 39.0–52.0)
Hemoglobin: 13.3 g/dL (ref 13.0–17.0)
MCH: 30.8 pg (ref 26.0–34.0)
MCHC: 34.1 g/dL (ref 30.0–36.0)
MCV: 90.3 fL (ref 80.0–100.0)
Platelets: 321 K/uL (ref 150–400)
RBC: 4.32 MIL/uL (ref 4.22–5.81)
RDW: 13.4 % (ref 11.5–15.5)
WBC: 6 K/uL (ref 4.0–10.5)
nRBC: 0 % (ref 0.0–0.2)

## 2024-05-26 LAB — TROPONIN T, HIGH SENSITIVITY
Troponin T High Sensitivity: 81 ng/L — ABNORMAL HIGH (ref 0–19)
Troponin T High Sensitivity: 80 ng/L — ABNORMAL HIGH (ref 0–19)

## 2024-05-26 LAB — LIPASE, BLOOD: Lipase: 30 U/L (ref 11–51)

## 2024-05-26 MED ORDER — ASPIRIN 81 MG PO CHEW
324.0000 mg | CHEWABLE_TABLET | Freq: Once | ORAL | Status: AC
  Administered 2024-05-26: 324 mg via ORAL
  Filled 2024-05-26: qty 4

## 2024-05-26 NOTE — ED Triage Notes (Signed)
 Pt presenting with intermittent chest pain. He says that it has happened 6 times over a span of 3-4 days, lasting about 20 minutes each. States that it feels like someone is sitting on his chest with their knees. Not currently having chest pain. Denies sob, n/v, lightheadedness. Pain radiates to both arms down to his fingertips.

## 2024-05-26 NOTE — H&P (Signed)
 History and Physical  ORI TREJOS FMW:[id number] DOB: 25-Nov-1950 DOA: 05/26/2024  PCP: [name], MD   Chief Complaint: Chest pain  HPI: Victor Price is a 73 y.o. male with medical history significant for CKD stage IIIb, HTN, BPH, erectile dysfunction, hypogonadism on testosterone  replacement, degenerative disc disease, bronchiectasis, and hyperthyroidism who presented to the [facility] ED for evaluation of chest pain.  ED Course: Initial vitals show patient afebrile, HR 60-80s, SBP 130-150s. Initial labs significant for creatinine 1.95, troponin 81->80, otherwise unremarkable CBC, electrolytes and LFTs. EKG shows sinus rhythm with no ischemic changes. CXR shows no active disease. Pt received aspirin  324 mg x 1.  Cardiology was consulted for evaluation. Patient was admitted to [facility] service and transferred to [facility].  Review of Systems: Please see HPI for pertinent positives and negatives. A complete 10 system review of systems are otherwise negative.  Past Medical History:  Diagnosis Date   BPH (benign prostatic hyperplasia)    Bronchiectasis with acute lower respiratory infection (HCC) 08/13/2019   Bronchitis    inhaler prn   Cancer (HCC)    skin cancer?   CKD (chronic kidney disease) stage 3, GFR 30-59 ml/min (HCC)    ED (erectile dysfunction)    GERD (gastroesophageal reflux disease)    HLD (hyperlipidemia)    Hypertension    Hyperthyroidism    Multinodular thyroid     Pneumonia    gets recurrent PNA in left lung, recent dx 08/13/19 left lower lobe   Seasonal allergies    Past Surgical History:  Procedure Laterality Date   BACK SURGERY  1980s   [facility]   COLONOSCOPY  2004, 2012   several - polyps   MULTIPLE TOOTH EXTRACTIONS     TOTAL HIP ARTHROPLASTY Left 08/24/2019   Procedure: LEFT TOTAL HIP ARTHROPLASTY-DIRECT ANTERIOR;  Surgeon: Barbarann Oneil BROCKS, MD;  Location: [facility] OR;  Service: Orthopedics;  Laterality: Left;   TOTAL HIP ARTHROPLASTY Right 08/26/2023    Procedure: RIGHT TOTAL HIP ARTHROPLASTY;  Surgeon: Barbarann Oneil BROCKS, MD;  Location: [facility] OR;  Service: Orthopedics;  Laterality: Right;  Needs RNFA   Social History:  reports that he has never smoked. He has never used smokeless tobacco. He reports that he does not drink alcohol and does not use drugs.  Allergies  Allergen Reactions   Losartan  Other (See Comments)    Lightheadedness, dizziness, syncope   Metoprolol  Other (See Comments)    dizziness   Nsaids Other (See Comments)    CKD 3     Family History  Problem Relation Age of Onset   Dementia Mother    Healthy Father      Prior to Admission medications   Medication Sig Start Date End Date Taking? Authorizing Provider  bisoprolol -hydrochlorothiazide  (ZIAC ) 10-6.25 MG tablet TAKE 1 TABLET BY MOUTH EVERY DAY 05/25/24   [name], MD  [facility]  RELIEF-D 10-240 MG 24 hr tablet TAKE 1 TABLET BY MOUTH EVERY DAY 07/29/23   [name], MD  FARXIGA  10 MG TABS tablet TAKE 1 TABLET BY MOUTH EVERY DAY 11/17/23   [name], [name], MD  fluticasone  (FLONASE ) 50 MCG/ACT nasal spray Place 2 sprays into both nostrils daily. 01/28/24   [name], PA-C  gabapentin  (NEURONTIN ) 600 MG tablet Take 1 tablet (600 mg total) by mouth 2 (two) times daily. 02/28/24   [name], [name], MD  ipratropium (ATROVENT ) 0.03 % nasal spray Place 2 sprays into both nostrils 2 (two) times daily as needed for rhinitis. For  runny nose 01/06/24   [name], MD  methimazole  (TAPAZOLE ) 10 MG tablet TAKE 1 TABLET BY MOUTH TWICE A DAY 01/18/24   [name], MD  sildenafil  (REVATIO ) 20 MG tablet TAKE 2 TO 5 TABLETS BY MOUTH DAILY AS NEEDED 05/08/24   [name], MD  tamsulosin  (FLOMAX ) 0.4 MG CAPS capsule TAKE 1 CAPSULE (0.4 MG TOTAL) BY MOUTH IN THE MORNING AND AT BEDTIME 11/17/23   [name], MD  Testosterone  10 MG/ACT (2%) GEL APPLY 1 PUMP TO AN INNER THIGH AND 2 PUMPS TO OPPOSITE INNER THIGH IN AM.  FORTESTA . 05/08/24   [name], MD  traZODone  (DESYREL ) 50 MG tablet TAKE 1/2 TO 2 TABLETS BY MOUTH AT BEDTIME AS NEEDED SLEEP 02/13/24   [name], MD  triamcinolone  cream (KENALOG ) 0.1 % Apply 1 Application topically 2 (two) times daily as needed (Rash). 10/19/21   [provider]  umeclidinium-vilanterol (ANORO ELLIPTA ) 62.5-25 MCG/ACT AEPB Inhale 1 puff into the lungs daily. 12/20/23   [name], MD    Physical Exam: BP (!) 141/84 (BP Location: Left Arm)   Pulse 66   Temp 98.2 F (36.8 C) (Oral)   Resp 16   Ht 6' (1.829 m)   Wt 76.9 kg   SpO2 100%   BMI 23.00 kg/m  General: Pleasant, well-appearing elderly man laying in bed. No acute distress. HEENT: [place]/AT. Anicteric sclera CV: RRR. No murmurs, rubs, or gallops. No LE edema Pulmonary: Lungs CTAB. Normal effort. No wheezing or rales. Abdominal: Soft, nontender, nondistended. Normal bowel sounds. Extremities: Palpable radial and DP pulses. Normal ROM. Skin: Warm and dry. No obvious rash or lesions. Neuro: A&Ox3. Moves all extremities. Normal sensation to light touch. No focal deficit. Psych: Normal mood and affect          Labs on Admission:  Basic Metabolic Panel: Recent Labs  Lab 05/24/24 1625 05/26/24 0903  NA 138 137  K 4.0 4.0  CL 97 100  CO2 23 25  GLUCOSE 106* 99  BUN 22 22  CREATININE 2.06* 1.95*  CALCIUM  10.2 9.9   Liver Function Tests: Recent Labs  Lab 05/26/24 0903  AST 25  ALT 18  ALKPHOS 75  BILITOT 0.4  PROT 7.2  ALBUMIN  4.3   Recent Labs  Lab 05/26/24 0903  LIPASE 30   No results for input(s): AMMONIA in the last 168 hours. CBC: Recent Labs  Lab 05/26/24 0903  WBC 6.0  HGB 13.3  HCT 39.0  MCV 90.3  PLT 321   Cardiac Enzymes: No results for input(s): CKTOTAL, CKMB, CKMBINDEX, TROPONINI in the last 168 hours. BNP (last 3 results) No results for input(s): BNP in the last 8760 hours.  ProBNP (last 3 results) No results for  input(s): PROBNP in the last 8760 hours.  CBG: No results for input(s): GLUCAP in the last 168 hours.  Radiological Exams on Admission: DG Chest 2 View Result Date: 05/26/2024 EXAM: 2 VIEW(S) XRAY OF THE CHEST 05/26/2024 09:50:51 AM COMPARISON: CT February 15, 2022, Chest Radiograph August 18, 2022. CLINICAL HISTORY: Chest pain. Pt presenting with intermittent chest pain. He says that it has happened 6 times over a span of 3-4 days, lasting about 20 minutes each. States that it feels like someone is sitting on his chest with their knees. Not currently having chest pain. Denies sob, n/v, lightheadedness. Pain radiates to both arms down to his fingertip. FINDINGS: LUNGS AND PLEURA: Mild residual scarring left lower lobe. No superimposed  confluent pulmonary infiltrate. No pneumothorax or pleural effusion. HEART AND MEDIASTINUM: No acute abnormality of the cardiac and mediastinal silhouettes. BONES AND SOFT TISSUES: No acute osseous abnormality. IMPRESSION: 1. No acute findings. 2. Mild residual scarring in the left lower lobe. Electronically signed by: Dorethia Molt MD 05/26/2024 09:55 AM EDT RP Workstation: [id number]   Assessment/Plan Victor Price is a 73 y.o. male with medical history significant for CKD stage IIIb, HTN, BPH, erectile dysfunction, hypogonadism on testosterone  replacement, degenerative disc disease, bronchiectasis, and hyperthyroidism who presented to the [facility] ED for evaluation of chest pain and admitted for possible NSTEMI.  # Chest pain # ?NSTEMI - - -  # HTN - BP elevated with SBP in the 140s to 150s  # CKD 3B - Creatinine of 1.95 improved compared to 2.06 2 days ago and 2.26 2 weeks ago - Trend renal function, avoid nephrotoxic agents  #***  #***  #***  #***  DVT prophylaxis: SCDs    Code Status: Full Code  Consults called: Cardiology  Family Communication: No family at bedside  Severity of Illness: The appropriate patient status for this patient is  INPATIENT. Inpatient status is judged to be reasonable and necessary in order to provide the required intensity of service to ensure the patient's safety. The patient's presenting symptoms, physical exam findings, and initial radiographic and laboratory data in the context of their chronic comorbidities is felt to place them at high risk for further clinical deterioration. Furthermore, it is not anticipated that the patient will be medically stable for discharge from the hospital within 2 midnights of admission.   * I certify that at the point of admission it is my clinical judgment that the patient will require inpatient hospital care spanning beyond 2 midnights from the point of admission due to high intensity of service, high risk for further deterioration and high frequency of surveillance required.*  Level of care: Progressive    Lou Claretta HERO, MD 05/26/2024, 8:16 PM [facility] Pager: [phone] Isaiah 41:10   If 7PM-7AM, please contact night-coverage [email or web address] Password [email or web address]

## 2024-05-26 NOTE — ED Notes (Signed)
 Called [facility] to check room status, room was still being cleaned.

## 2024-05-26 NOTE — ED Provider Notes (Signed)
 Emergency Department Provider Note   I have reviewed the triage vital signs and the nursing notes.   HISTORY  Chief Complaint Chest Pain   HPI Victor Price is a 73 y.o. male past history reviewed below presents to the emergency department with intermittent central chest tightness.  Patient has had approximately 6 episodes of tightness in the past 5 days.  He has found no clear association with movement, eating, times of day, etc. No diaphoresis or vomiting. No SOB symptoms.  No active chest discomfort.  The last episode was 10 PM last night.  He states it feels like someone sitting or standing on his chest and gradually increases in severity.  There is nothing he can do to relieve the pain.  He states that over around 20 minutes the pain gradually decreases. No history of ACS.    Past Medical History:  Diagnosis Date   BPH (benign prostatic hyperplasia)    Bronchiectasis with acute lower respiratory infection (HCC) 08/13/2019   Bronchitis    inhaler prn   Cancer (HCC)    skin cancer?   CKD (chronic kidney disease) stage 3, GFR 30-59 ml/min (HCC)    ED (erectile dysfunction)    GERD (gastroesophageal reflux disease)    HLD (hyperlipidemia)    Hypertension    Hyperthyroidism    Multinodular thyroid     Pneumonia    gets recurrent PNA in left lung, recent dx 08/13/19 left lower lobe   Seasonal allergies     Review of Systems  Constitutional: No fever/chills Cardiovascular: Positive chest pain. Respiratory: Denies shortness of breath. Gastrointestinal: No abdominal pain.  No nausea, no vomiting.  Musculoskeletal: Negative for back pain. Skin: Negative for rash. Neurological: Negative for headaches. ____________________________________________   PHYSICAL EXAM:  VITAL SIGNS: ED Triage Vitals  Encounter Vitals Group     BP 05/26/24 0853 (!) 142/89     Pulse Rate 05/26/24 0853 80     Resp 05/26/24 0853 16     Temp 05/26/24 0853 98.2 F (36.8 C)     Temp Source  05/26/24 0853 Oral     SpO2 05/26/24 0853 96 %     Weight 05/26/24 0859 179 lb (81.2 kg)     Height 05/26/24 0859 6' (1.829 m)   Constitutional: Alert and oriented. Well appearing and in no acute distress. Eyes: Conjunctivae are normal.  Head: Atraumatic. Nose: No congestion/rhinnorhea. Mouth/Throat: Mucous membranes are moist. Neck: No stridor.  Cardiovascular: Normal rate, regular rhythm. Good peripheral circulation. Grossly normal heart sounds.   Respiratory: Normal respiratory effort.  No retractions. Lungs CTAB. Gastrointestinal: Soft and nontender. No distention.  Musculoskeletal: No gross deformities of extremities. Neurologic:  Normal speech and language.  Skin:  Skin is warm, dry and intact. No rash noted.  ____________________________________________   LABS (all labs ordered are listed, but only abnormal results are displayed)  Labs Reviewed  BASIC METABOLIC PANEL WITH GFR - Abnormal; Notable for the following components:      Result Value   Creatinine, Ser 1.95 (*)    GFR, Estimated 36 (*)    All other components within normal limits  HEPATIC FUNCTION PANEL - Abnormal; Notable for the following components:   Indirect Bilirubin 0.2 (*)    All other components within normal limits  TROPONIN T, HIGH SENSITIVITY - Abnormal; Notable for the following components:   Troponin T High Sensitivity 81 (*)    All other components within normal limits  TROPONIN T, HIGH SENSITIVITY - Abnormal; Notable for  the following components:   Troponin T High Sensitivity 80 (*)    All other components within normal limits  CBC  LIPASE, BLOOD   ____________________________________________  EKG   EKG Interpretation Date/Time:  Saturday May 26 2024 09:01:33 EDT Ventricular Rate:  81 PR Interval:  189 QRS Duration:  102 QT Interval:  384 QTC Calculation: 446 R Axis:   74  Text Interpretation: Sinus rhythm Confirmed by Darra Chew [phone]) on 05/26/2024 9:09:01 AM         ____________________________________________  RADIOLOGY  DG Chest 2 View Result Date: 05/26/2024 EXAM: 2 VIEW(S) XRAY OF THE CHEST 05/26/2024 09:50:51 AM COMPARISON: CT February 15, 2022, Chest Radiograph August 18, 2022. CLINICAL HISTORY: Chest pain. Pt presenting with intermittent chest pain. He says that it has happened 6 times over a span of 3-4 days, lasting about 20 minutes each. States that it feels like someone is sitting on his chest with their knees. Not currently having chest pain. Denies sob, n/v, lightheadedness. Pain radiates to both arms down to his fingertip. FINDINGS: LUNGS AND PLEURA: Mild residual scarring left lower lobe. No superimposed confluent pulmonary infiltrate. No pneumothorax or pleural effusion. HEART AND MEDIASTINUM: No acute abnormality of the cardiac and mediastinal silhouettes. BONES AND SOFT TISSUES: No acute osseous abnormality. IMPRESSION: 1. No acute findings. 2. Mild residual scarring in the left lower lobe. Electronically signed by: Dorethia Molt MD 05/26/2024 09:55 AM EDT RP Workstation: [id number]    ____________________________________________   PROCEDURES  Procedure(s) performed:   Procedures  None  ____________________________________________   INITIAL IMPRESSION / ASSESSMENT AND PLAN / ED COURSE  Pertinent labs & imaging results that were available during my care of the patient were reviewed by me and considered in my medical decision making (see chart for details).   This patient is Presenting for Evaluation of CP, which does require a range of treatment options, and is a complaint that involves a high risk of morbidity and mortality.  The Differential Diagnoses includes but is not exclusive to acute coronary syndrome, aortic dissection, pulmonary embolism, cardiac tamponade, community-acquired pneumonia, pericarditis, musculoskeletal chest wall pain, etc.   Critical Interventions-    Medications  hydrochlorothiazide  (HYDRODIURIL )  tablet 6.25 mg (has no administration in time range)  traZODone  (DESYREL ) tablet 25-100 mg (has no administration in time range)  dapagliflozin  propanediol (FARXIGA ) tablet 10 mg (has no administration in time range)  methimazole  (TAPAZOLE ) tablet 10 mg (has no administration in time range)  testosterone  (ANDROGEL ) 50 MG/5GM (1%) gel 5 g (has no administration in time range)  tamsulosin  (FLOMAX ) capsule 0.4 mg (has no administration in time range)  gabapentin  (NEURONTIN ) capsule 600 mg (has no administration in time range)  fluticasone  (FLONASE ) 50 MCG/ACT nasal spray 2 spray (has no administration in time range)  ipratropium (ATROVENT ) 0.06 % nasal spray 2 spray (has no administration in time range)  umeclidinium-vilanterol (ANORO ELLIPTA ) 62.5-25 MCG/ACT 1 puff (has no administration in time range)  aspirin  chewable tablet 324 mg (324 mg Oral Given 05/26/24 1010)    Reassessment after intervention:  no active CP  I decided to review pertinent External Data, and in summary patient seen at [facility] yesterday.   Clinical Laboratory Tests Ordered, included troponin minimally elevated to 81.  CKD near baseline at 1.95.  Radiologic Tests Ordered, included CXR. I independently interpreted the images and agree with radiology interpretation.   Cardiac Monitor Tracing which shows NSR.    Social Determinants of Health Risk patient is a non-smoker.   Consult complete  with Cardiology Dr. Floretta. Agrees with plan for admit to medicine service at [facility]. Gave ASA. Can hold heparin unless troponin is up-trending.   TRH, Dr. Georgina. Plan for admit.    Medical Decision Making: Summary:  Patient presents emergency department with chest discomfort.  It occurs at intermittent, random episodes.  Last time was 10 PM yesterday.  No active pain today.  Low risk for PE.  Description of pain and other symptoms is not consistent with PE.  Vital signs are reassuring. Plan for ACS evaluation primarily.    Reevaluation with update and discussion with patient.  He continues to be pain-free.  Discussed plan for admit.  He is in agreement.  Patient's presentation is most consistent with acute presentation with potential threat to life or bodily function.   Disposition: admit  ____________________________________________  FINAL CLINICAL IMPRESSION(S) / ED DIAGNOSES  Final diagnoses:  Precordial chest pain    Note:  This document was prepared using Dragon voice recognition software and may include unintentional dictation errors.  Fonda Law, MD, [facility] Emergency Medicine    Zayne Marovich, Fonda MATSU, MD 05/27/24 [phone]

## 2024-05-26 NOTE — Consult Note (Addendum)
 Cardiology Consultation   Patient ID: CLAUDIUS MICH MRN: [id number]; DOB: 1951/07/18  Admit date: 05/26/2024 Date of Consult: 05/26/2024  PCP:  [name], MD   [facility] Providers Cardiologist:  None        Patient Profile: Victor Price is a 73 y.o. male with a hx of CKD stage IIIb, HTN, BPH, erectile dysfunction, hypogonadism on testosterone  replacement, degenerative disc disease, bronchiectasis, and hyperthyroidism who is being seen 05/26/2024 for the evaluation of chest pain.   History of Present Illness: Mr. Baillie reports that for the past 5 days, he's been having episodes of chest pain (one episode per day except for yesterday when he had 2 episodes). He describes this chest pain as retrosternal, non-pleuritic, non-positional, pressure-like, non-exertional (comes at rest and does not increase in intensity with exertion) pain with radiation to the bilateral fingers (does not involve arms). Each episode would last for about 20 minutes and resolves spontaneously. No associated shortness of breath, nausea, vomiting, diaphoresis, palpitations, pre-syncope, or syncope. Last episode was yesterday night. No recent illnesses or fever. No prior similar episodes. No prior cardiac history.   He reports that yesterday he left heavy boxes with no recurrence of this chest pain during that activity.   No prior tobacco use. He endorses heart disease in grandfather who died with heart attacks in his last 38s but no heart disease in first  degree relatives.   Labs showed normal CBC, Cr 1.9 (at baseline). CXR with no acute findings. Troponin was 81=>80. EKG with normal sinus rhythm and no acute ischemic changes.   Past Medical History:  Diagnosis Date   BPH (benign prostatic hyperplasia)    Bronchiectasis with acute lower respiratory infection (HCC) 08/13/2019   Bronchitis    inhaler prn   Cancer (HCC)    skin cancer?   CKD (chronic kidney disease) stage 3, GFR 30-59  ml/min (HCC)    ED (erectile dysfunction)    GERD (gastroesophageal reflux disease)    HLD (hyperlipidemia)    Hypertension    Hyperthyroidism    Multinodular thyroid     Pneumonia    gets recurrent PNA in left lung, recent dx 08/13/19 left lower lobe   Seasonal allergies     Past Surgical History:  Procedure Laterality Date   BACK SURGERY  1980s   [facility]   COLONOSCOPY  2004, 2012   several - polyps   MULTIPLE TOOTH EXTRACTIONS     TOTAL HIP ARTHROPLASTY Left 08/24/2019   Procedure: LEFT TOTAL HIP ARTHROPLASTY-DIRECT ANTERIOR;  Surgeon: Barbarann Oneil BROCKS, MD;  Location: [facility] OR;  Service: Orthopedics;  Laterality: Left;   TOTAL HIP ARTHROPLASTY Right 08/26/2023   Procedure: RIGHT TOTAL HIP ARTHROPLASTY;  Surgeon: Barbarann Oneil BROCKS, MD;  Location: [facility] OR;  Service: Orthopedics;  Laterality: Right;  Needs RNFA     Home Medications:  Prior to Admission medications   Medication Sig Start Date End Date Taking? Authorizing Provider  bisoprolol -hydrochlorothiazide  (ZIAC ) 10-6.25 MG tablet TAKE 1 TABLET BY MOUTH EVERY DAY 05/25/24   [name], MD  [facility]  RELIEF-D 10-240 MG 24 hr tablet TAKE 1 TABLET BY MOUTH EVERY DAY 07/29/23   [name], MD  FARXIGA  10 MG TABS tablet TAKE 1 TABLET BY MOUTH EVERY DAY 11/17/23   [name], [name], MD  fluticasone  (FLONASE ) 50 MCG/ACT nasal spray Place 2 sprays into both nostrils daily. 01/28/24   [name], PA-C  gabapentin  (NEURONTIN ) 600 MG tablet Take 1 tablet (600 mg total)  by mouth 2 (two) times daily. 02/28/24   [name], MD  ipratropium (ATROVENT ) 0.03 % nasal spray Place 2 sprays into both nostrils 2 (two) times daily as needed for rhinitis. For runny nose 01/06/24   [name], MD  methimazole  (TAPAZOLE ) 10 MG tablet TAKE 1 TABLET BY MOUTH TWICE A DAY 01/18/24   [name], MD  sildenafil  (REVATIO ) 20 MG tablet TAKE 2 TO 5 TABLETS BY MOUTH DAILY AS NEEDED 05/08/24   [name], MD   tamsulosin  (FLOMAX ) 0.4 MG CAPS capsule TAKE 1 CAPSULE (0.4 MG TOTAL) BY MOUTH IN THE MORNING AND AT BEDTIME 11/17/23   [name], MD  Testosterone  10 MG/ACT (2%) GEL APPLY 1 PUMP TO AN INNER THIGH AND 2 PUMPS TO OPPOSITE INNER THIGH IN AM. FORTESTA . 05/08/24   [name], MD  traZODone  (DESYREL ) 50 MG tablet TAKE 1/2 TO 2 TABLETS BY MOUTH AT BEDTIME AS NEEDED SLEEP 02/13/24   [name], MD  triamcinolone  cream (KENALOG ) 0.1 % Apply 1 Application topically 2 (two) times daily as needed (Rash). 10/19/21   [provider]  umeclidinium-vilanterol (ANORO ELLIPTA ) 62.5-25 MCG/ACT AEPB Inhale 1 puff into the lungs daily. 12/20/23   [name], MD    Scheduled Meds:  Continuous Infusions:  PRN Meds:   Allergies:    Allergies  Allergen Reactions   Losartan  Other (See Comments)    Lightheadedness, dizziness, syncope   Metoprolol  Other (See Comments)    dizziness   Nsaids Other (See Comments)    CKD 3     Social History:   Social History   Socioeconomic History   Marital status: Married    Spouse name: Not on file   Number of children: 0   Years of education: 14   Highest education level: Associate degree: occupational, Scientist, product/process development, or vocational program  Occupational History   Occupation: [employer]    Employer: [employer]    Comment: Retired    Comment: Part-time  Tobacco Use   Smoking status: Never   Smokeless tobacco: Never  Vaping Use   Vaping status: Never Used  Substance and Sexual Activity   Alcohol use: No   Drug use: No   Sexual activity: Not on file  Other Topics Concern   Not on file  Social History Narrative   Lives with his domestic partner. Works part-time. 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      Social Drivers of Corporate investment banker Strain: Low Risk  (04/13/2024)   Received from [facility]   Overall Financial Resource Strain (CARDIA)    How hard is it for you to pay for the very basics  like food, housing, medical care, and heating?: Not hard at all  Food Insecurity: No Food Insecurity (04/13/2024)   Received from [facility]   Hunger Vital Sign    Within the past 12 months, you worried that your food would run out before you got the money to buy more.: Never true    Within the past 12 months, the food you bought just didn't last and you didn't have money to get more.: Never true  Transportation Needs: No Transportation Needs (04/13/2024)   Received from [facility] - Transportation    In the past 12 months, has lack of transportation kept you from medical appointments or from getting medications?: No    In the past 12 months, has lack of transportation kept you  from meetings, work, or from getting things needed for daily living?: No  Physical Activity: Sufficiently Active (04/13/2024)   Received from [facility]   Exercise Vital Sign    On average, how many days per week do you engage in moderate to strenuous exercise (like a brisk walk)?: 3 days    On average, how many minutes do you engage in exercise at this level?: 60 min  Stress: No Stress Concern Present (04/13/2024)   Received from [facility] of Occupational Health - Occupational Stress Questionnaire    Do you feel stress - tense, restless, nervous, or anxious, or unable to sleep at night because your mind is troubled all the time - these days?: Not at all  Social Connections: Socially Integrated (04/13/2024)   Received from [facility]   Social Network    How would you rate your social network (family, work, friends)?: Good participation with social networks  Intimate Partner Violence: Not At Risk (04/13/2024)   Received from [facility]   HITS    Over the last 12 months how often did your partner physically hurt you?: Never    Over the last 12 months how often did your partner insult you or talk down to you?: Never    Over the last 12 months how often did your partner threaten  you with physical harm?: Never    Over the last 12 months how often did your partner scream or curse at you?: Never    Family History:    Family History  Problem Relation Age of Onset   Dementia Mother    Healthy Father      ROS:  Please see the history of present illness.   All other ROS reviewed and negative.     Physical Exam/Data: Vitals:   05/26/24 1656 05/26/24 1700 05/26/24 1715 05/26/24 1730  BP:  (!) 147/88 (!) 153/99 (!) 150/90  Pulse:  64 75 78  Resp:  17 19 17   Temp: 98.2 F (36.8 C)     TempSrc: Oral     SpO2:  94% 100% 99%  Weight:      Height:       No intake or output data in the 24 hours ending 05/26/24 1822    05/26/2024    8:59 AM 05/25/2024    4:32 PM 05/08/2024    1:22 PM  Last 3 Weights  Weight (lbs) 179 lb 170 lb 170 lb  Weight (kg) 81.194 kg 77.111 kg 77.111 kg     Body mass index is 24.28 kg/m.  General:  Well nourished, well developed, in no acute distress HEENT: normal Neck: no JVD Vascular: Distal pulses 2+ bilaterally Cardiac:  normal S1, S2; RRR; no murmur  Lungs:  clear to auscultation bilaterally, no wheezing, rhonchi or rales  Abd: soft, nontender Ext: no edema Musculoskeletal:  No deformities, BUE and BLE strength normal and equal Skin: warm and dry  Neuro: no focal abnormalities noted Psych:  Normal affect   EKG:  The EKG was personally reviewed and demonstrates:  sinus rhythm with no acute ischemic changes  Relevant CV Studies: none  Laboratory Data: High Sensitivity Troponin:  No results for input(s): TROPONINIHS in the last 720 hours.   Chemistry Recent Labs  Lab 05/24/24 1625 05/26/24 0903  NA 138 137  K 4.0 4.0  CL 97 100  CO2 23 25  GLUCOSE 106* 99  BUN 22 22  CREATININE 2.06* 1.95*  CALCIUM  10.2 9.9  GFRNONAA  --  36*  ANIONGAP  --  12    Recent Labs  Lab 05/26/24 0903  PROT 7.2  ALBUMIN  4.3  AST 25  ALT 18  ALKPHOS 75  BILITOT 0.4   Lipids No results for input(s): CHOL, TRIG, HDL,  LABVLDL, LDLCALC, CHOLHDL in the last 168 hours.  Hematology Recent Labs  Lab 05/26/24 0903  WBC 6.0  RBC 4.32  HGB 13.3  HCT 39.0  MCV 90.3  MCH 30.8  MCHC 34.1  RDW 13.4  PLT 321   Thyroid  No results for input(s): TSH, FREET4 in the last 168 hours.  BNPNo results for input(s): BNP, PROBNP in the last 168 hours.  DDimer No results for input(s): DDIMER in the last 168 hours.  Radiology/Studies:  DG Chest 2 View Result Date: 05/26/2024 EXAM: 2 VIEW(S) XRAY OF THE CHEST 05/26/2024 09:50:51 AM COMPARISON: CT February 15, 2022, Chest Radiograph August 18, 2022. CLINICAL HISTORY: Chest pain. Pt presenting with intermittent chest pain. He says that it has happened 6 times over a span of 3-4 days, lasting about 20 minutes each. States that it feels like someone is sitting on his chest with their knees. Not currently having chest pain. Denies sob, n/v, lightheadedness. Pain radiates to both arms down to his fingertip. FINDINGS: LUNGS AND PLEURA: Mild residual scarring left lower lobe. No superimposed confluent pulmonary infiltrate. No pneumothorax or pleural effusion. HEART AND MEDIASTINUM: No acute abnormality of the cardiac and mediastinal silhouettes. BONES AND SOFT TISSUES: No acute osseous abnormality. IMPRESSION: 1. No acute findings. 2. Mild residual scarring in the left lower lobe. Electronically signed by: Dorethia Molt MD 05/26/2024 09:55 AM EDT RP Workstation: [id number]     Assessment and Plan: Chest pain  5 days of episodes of non-exertional, non-pleuritic, non-positional, pressure-like, retrosternal chest pain. He did exert himself yesterday between episodes with no reproduction of chest pain. Troponin were mildly elevated but flat (81=>80) and EKG did not show any acute ischemic changes. The presentation makes ACS, including unstable angina, less likely. Although the chest pain has mostly atypical features, it does have some features concerning for a cardiac etiology  and warrants further evaluation.   I think a non-invasive approach is reasonable here. With his baseline kidney disease, would not do CTA. He's able to exercise, so will plan on exercise stress echo.  - exercise stress echo - Hold home bisoprolol    Risk Assessment/Risk Scores:      For questions or updates, please contact [facility] Please consult [email or web address] for contact info under      Signed, Gillian CHRISTELLA Cass, MD  05/26/2024 6:22 PM

## 2024-05-26 NOTE — Progress Notes (Signed)
 73 year old male with CKD stage IIIb, essential hypertension on Ziac , BPH and erectile dysfunction, hypogonadism on testosterone  replacement and Flomax  and sildenafil , degenerative disc disease,'s bronchiectasis, hyperthyroidism, chart has methimazole , med list also shows Farxiga  gabapentin , chest x-ray today with negative findings except for residual scarring in the left lower lobe,EKG today is sinus rhythm 81 PR 189 QRS 102 QTc 446 upright axis no ST elevations or T wave inversions, troponin of 81 and repeat troponin flat at 80, with stable vitals afebrile, CMP at baseline, CBC within normal limits, no echocardiogram in chart and no cardiac history------- seen yesterday with description of chest pain and chest pressure over the past several days.  No associated shortness of breath per nursing note.  Chest pain description is pressure-like intermittent over the past 3 to 4 days each episode lasting several minutes.  No associated nausea vomiting radiation shortness of breath or palpitations. Patient was seen at [facility] and case was discussed with Dr. Floretta cardiology on-call. [facility] call was taken by Dr.Willie Georgina.

## 2024-05-27 DIAGNOSIS — I5031 Acute diastolic (congestive) heart failure: Secondary | ICD-10-CM | POA: Diagnosis not present

## 2024-05-27 DIAGNOSIS — N179 Acute kidney failure, unspecified: Secondary | ICD-10-CM | POA: Diagnosis not present

## 2024-05-27 DIAGNOSIS — I319 Disease of pericardium, unspecified: Secondary | ICD-10-CM | POA: Diagnosis not present

## 2024-05-27 DIAGNOSIS — E039 Hypothyroidism, unspecified: Secondary | ICD-10-CM | POA: Diagnosis not present

## 2024-05-27 DIAGNOSIS — J479 Bronchiectasis, uncomplicated: Secondary | ICD-10-CM | POA: Diagnosis not present

## 2024-05-27 DIAGNOSIS — E0590 Thyrotoxicosis, unspecified without thyrotoxic crisis or storm: Secondary | ICD-10-CM | POA: Diagnosis not present

## 2024-05-27 DIAGNOSIS — I130 Hypertensive heart and chronic kidney disease with heart failure and stage 1 through stage 4 chronic kidney disease, or unspecified chronic kidney disease: Secondary | ICD-10-CM | POA: Diagnosis not present

## 2024-05-27 DIAGNOSIS — Z8639 Personal history of other endocrine, nutritional and metabolic disease: Secondary | ICD-10-CM

## 2024-05-27 DIAGNOSIS — I4891 Unspecified atrial fibrillation: Secondary | ICD-10-CM | POA: Diagnosis not present

## 2024-05-27 DIAGNOSIS — I214 Non-ST elevation (NSTEMI) myocardial infarction: Secondary | ICD-10-CM | POA: Diagnosis not present

## 2024-05-27 DIAGNOSIS — R7303 Prediabetes: Secondary | ICD-10-CM

## 2024-05-27 DIAGNOSIS — D62 Acute posthemorrhagic anemia: Secondary | ICD-10-CM | POA: Diagnosis not present

## 2024-05-27 DIAGNOSIS — N1832 Chronic kidney disease, stage 3b: Secondary | ICD-10-CM | POA: Diagnosis not present

## 2024-05-27 DIAGNOSIS — E871 Hypo-osmolality and hyponatremia: Secondary | ICD-10-CM | POA: Diagnosis not present

## 2024-05-27 LAB — TROPONIN I (HIGH SENSITIVITY)
Troponin I (High Sensitivity): 222 ng/L (ref <18)
Troponin I (High Sensitivity): 229 ng/L (ref <18)

## 2024-05-27 MED ORDER — ASPIRIN 81 MG PO TBEC: 81.0000 mg | DELAYED_RELEASE_TABLET | Freq: Every day | ORAL | Status: DC

## 2024-05-27 MED ORDER — GABAPENTIN 300 MG PO CAPS
600.0000 mg | ORAL_CAPSULE | Freq: Two times a day (BID) | ORAL | Status: DC
  Administered 2024-05-27 – 2024-05-30 (×8): 600 mg via ORAL
  Filled 2024-05-27 (×8): qty 2

## 2024-05-27 MED ORDER — TAMSULOSIN HCL 0.4 MG PO CAPS
0.4000 mg | ORAL_CAPSULE | Freq: Two times a day (BID) | ORAL | Status: DC
Start: 2024-05-27 — End: 2024-05-31
  Administered 2024-05-27 – 2024-05-30 (×8): 0.4 mg via ORAL
  Filled 2024-05-27 (×8): qty 1

## 2024-05-27 MED ORDER — METHIMAZOLE 10 MG PO TABS
10.0000 mg | ORAL_TABLET | Freq: Two times a day (BID) | ORAL | Status: DC
  Administered 2024-05-27 – 2024-05-30 (×8): 10 mg via ORAL
  Filled 2024-05-27 (×9): qty 1

## 2024-05-27 MED ORDER — UMECLIDINIUM-VILANTEROL 62.5-25 MCG/ACT IN AEPB
1.0000 | INHALATION_SPRAY | Freq: Every day | RESPIRATORY_TRACT | Status: DC
  Administered 2024-05-27 – 2024-06-04 (×7): 1 via RESPIRATORY_TRACT
  Filled 2024-05-27 (×3): qty 14

## 2024-05-27 MED ORDER — TESTOSTERONE 50 MG/5GM (1%) TD GEL
5.0000 g | Freq: Every day | TRANSDERMAL | Status: DC
  Administered 2024-05-27 – 2024-06-05 (×5): 5 g via TRANSDERMAL
  Filled 2024-05-27 (×7): qty 5

## 2024-05-27 MED ORDER — NITROGLYCERIN 0.4 MG SL SUBL: 0.4000 mg | SUBLINGUAL_TABLET | SUBLINGUAL | Status: DC | PRN

## 2024-05-27 MED ORDER — DILTIAZEM HCL ER COATED BEADS 180 MG PO CP24
180.0000 mg | ORAL_CAPSULE | Freq: Every day | ORAL | Status: DC
Start: 2024-05-27 — End: 2024-05-27
  Administered 2024-05-27: 180 mg via ORAL
  Filled 2024-05-27: qty 1

## 2024-05-27 MED ORDER — ASPIRIN 81 MG PO TBEC
81.0000 mg | DELAYED_RELEASE_TABLET | Freq: Every day | ORAL | Status: DC
  Administered 2024-05-27 – 2024-05-30 (×3): 81 mg via ORAL
  Filled 2024-05-27 (×3): qty 1

## 2024-05-27 MED ORDER — IPRATROPIUM BROMIDE 0.06 % NA SOLN: 2.0000 | Freq: Two times a day (BID) | NASAL | Status: DC | PRN

## 2024-05-27 MED ORDER — DAPAGLIFLOZIN PROPANEDIOL 10 MG PO TABS
10.0000 mg | ORAL_TABLET | Freq: Every day | ORAL | Status: DC
  Administered 2024-05-27: 10 mg via ORAL
  Filled 2024-05-27: qty 1

## 2024-05-27 MED ORDER — ASPIRIN 325 MG PO TABS: 325.0000 mg | ORAL_TABLET | Freq: Once | ORAL | Status: DC

## 2024-05-27 MED ORDER — DILTIAZEM HCL ER COATED BEADS 240 MG PO CP24
240.0000 mg | ORAL_CAPSULE | Freq: Every day | ORAL | Status: DC
Start: 2024-05-28 — End: 2024-05-27

## 2024-05-27 MED ORDER — FLUTICASONE PROPIONATE 50 MCG/ACT NA SUSP
2.0000 | Freq: Every day | NASAL | Status: DC
  Administered 2024-05-29 – 2024-06-01 (×3): 2 via NASAL
  Filled 2024-05-27 (×2): qty 16

## 2024-05-27 MED ORDER — DILTIAZEM HCL ER COATED BEADS 180 MG PO CP24
180.0000 mg | ORAL_CAPSULE | Freq: Every day | ORAL | Status: DC
  Administered 2024-05-28 – 2024-05-30 (×3): 180 mg via ORAL
  Filled 2024-05-27 (×4): qty 1

## 2024-05-27 MED ORDER — TRAZODONE HCL 50 MG PO TABS
25.0000 mg | ORAL_TABLET | Freq: Every evening | ORAL | Status: DC | PRN
  Administered 2024-05-27 – 2024-05-28 (×2): 50 mg via ORAL
  Administered 2024-05-29: 100 mg via ORAL
  Administered 2024-05-31 – 2024-06-02 (×3): 50 mg via ORAL
  Filled 2024-05-27: qty 2
  Filled 2024-05-27 (×2): qty 1
  Filled 2024-05-27 (×2): qty 2
  Filled 2024-05-27: qty 1

## 2024-05-27 MED ORDER — HEPARIN (PORCINE) 25000 UT/250ML-% IV SOLN
1200.0000 [IU]/h | INTRAVENOUS | Status: DC
  Administered 2024-05-27 – 2024-05-28 (×2): 1050 [IU]/h via INTRAVENOUS
  Filled 2024-05-27 (×2): qty 250

## 2024-05-27 MED ORDER — HYDROCHLOROTHIAZIDE 12.5 MG PO TABS
6.2500 mg | ORAL_TABLET | Freq: Every day | ORAL | Status: DC
Start: 2024-05-27 — End: 2024-05-28
  Administered 2024-05-27: 6.25 mg via ORAL
  Filled 2024-05-27: qty 1

## 2024-05-27 MED ORDER — HEPARIN BOLUS VIA INFUSION
4000.0000 [IU] | Freq: Once | INTRAVENOUS | Status: AC
  Administered 2024-05-27: 4000 [IU] via INTRAVENOUS
  Filled 2024-05-27: qty 4000

## 2024-05-27 NOTE — Progress Notes (Signed)
 I was notified of an episode of 10/10 chest pain that was similar to prior, lasted for few minutes, and resolved spontaneously. By the time the EKG was done, chest pain had already resolved. EKG with no acute ischemic changes.   Ordered troponin which came back 222 (trend yesterday was 81=>80).   The pattern of his chest pain is concerning for coronary vasospasm, and he was already started on diltiazem 180 mg today given this concern. However, given that we can't ascertain that this patient's NSTEMI is related to vasospasm (despite the clinical suspicion), will start heparin gtt and make him NPO after midnight as invasive testing may be warranted at this point.

## 2024-05-27 NOTE — Progress Notes (Addendum)
 Pt having 10/10 CP, midsternal, sharp. HR 80-90s NSR, BP 166/99. Pt refused NTG or any pain meds, stating he knows it will go away soon, just wanted to make someone aware. MD Pokhrel notified. Got EKG, though pain subsided by the time EKG had been set up, and no data was showing on the tele monitor during beginning of this event. MD Sahal notified, troponin ordered.

## 2024-05-27 NOTE — Progress Notes (Signed)
 Progress Note  Patient Name: Victor Price Date of Encounter: 05/27/2024  Primary Cardiologist: None   Subjective   No additional chest pain.   Inpatient Medications    Scheduled Meds:  dapagliflozin  propanediol  10 mg Oral Daily   fluticasone   2 spray Each Nare Daily   gabapentin   600 mg Oral BID   hydrochlorothiazide   6.25 mg Oral Daily   methimazole   10 mg Oral BID   tamsulosin   0.4 mg Oral BID   testosterone   5 g Transdermal Daily   umeclidinium-vilanterol  1 puff Inhalation Daily   Continuous Infusions:  PRN Meds: ipratropium, traZODone    Vital Signs    Vitals:   05/26/24 1831 05/26/24 2023 05/26/24 2326 05/27/24 0808  BP: (!) 141/84 (!) 146/92 132/89 (!) 140/94  Pulse: 66 72 72 75  Resp: 16 16 16 18   Temp: 98.2 F (36.8 C) 98.3 F (36.8 C) 98.5 F (36.9 C) 98.2 F (36.8 C)  TempSrc: Oral Oral Oral Oral  SpO2: 100% 100% 100%   Weight: 76.9 kg     Height: 6' (1.829 m)      No intake or output data in the 24 hours ending 05/27/24 1048 Filed Weights   05/26/24 0859 05/26/24 1831  Weight: 81.2 kg 76.9 kg    Telemetry    nsr - Personally Reviewed  ECG    pending- Personally Reviewed  Physical Exam   GEN: No acute distress.   Neck: No JVD Cardiac: RRR, no murmurs, rubs, or gallops.  Respiratory: Clear to auscultation bilaterally. GI: Soft, nontender, non-distended  MS: No edema; No deformity. Neuro:  Nonfocal  Psych: Normal affect   Labs    Chemistry Recent Labs  Lab 05/24/24 1625 05/26/24 0903  NA 138 137  K 4.0 4.0  CL 97 100  CO2 23 25  GLUCOSE 106* 99  BUN 22 22  CREATININE 2.06* 1.95*  CALCIUM  10.2 9.9  PROT  --  7.2  ALBUMIN   --  4.3  AST  --  25  ALT  --  18  ALKPHOS  --  75  BILITOT  --  0.4  GFRNONAA  --  36*  ANIONGAP  --  12     Hematology Recent Labs  Lab 05/26/24 0903  WBC 6.0  RBC 4.32  HGB 13.3  HCT 39.0  MCV 90.3  MCH 30.8  MCHC 34.1  RDW 13.4  PLT 321    Cardiac EnzymesNo results for  input(s): TROPONINI in the last 168 hours. No results for input(s): TROPIPOC in the last 168 hours.   BNPNo results for input(s): BNP, PROBNP in the last 168 hours.   DDimer No results for input(s): DDIMER in the last 168 hours.   Radiology    DG Chest 2 View Result Date: 05/26/2024 EXAM: 2 VIEW(S) XRAY OF THE CHEST 05/26/2024 09:50:51 AM COMPARISON: CT February 15, 2022, Chest Radiograph August 18, 2022. CLINICAL HISTORY: Chest pain. Pt presenting with intermittent chest pain. He says that it has happened 6 times over a span of 3-4 days, lasting about 20 minutes each. States that it feels like someone is sitting on his chest with their knees. Not currently having chest pain. Denies sob, n/v, lightheadedness. Pain radiates to both arms down to his fingertip. FINDINGS: LUNGS AND PLEURA: Mild residual scarring left lower lobe. No superimposed confluent pulmonary infiltrate. No pneumothorax or pleural effusion. HEART AND MEDIASTINUM: No acute abnormality of the cardiac and mediastinal silhouettes. BONES AND SOFT TISSUES: No acute  osseous abnormality. IMPRESSION: 1. No acute findings. 2. Mild residual scarring in the left lower lobe. Electronically signed by: Dorethia Molt MD 05/26/2024 09:55 AM EDT RP Workstation: [id number]    Cardiac Studies   none  Patient Profile     73 y.o. male admitted with recurrent, non-exertional chest pain radiating to the arms and lasting up to 20 minutes.   Assessment & Plan    Atypical chest pain - his symptoms sound like possible coronary spasm. Work up is limited by her elevated creatinine and no objective findings of ischemia. I'll start him on oral cardizem today. If stable, he can go home tomorrow with outpatient stress echo. These are not done on weekend and done best in the office.     For questions or updates, please contact [facility] Please consult [email or web address] for contact info under Cardiology/STEMI.      Signed, Danelle Birmingham, MD   05/27/2024, 10:48 AM

## 2024-05-27 NOTE — Care Management CC44 (Signed)
 Condition Code 44 Documentation Completed  Patient Details  Name: Victor Price MRN: [id number] Date of Birth: 1950-08-18   Condition Code 44 given:  Yes Patient signature on Condition Code 44 notice:  Yes Documentation of 2 MD's agreement:  Yes Code 44 added to claim:  Yes    Robynn Eileen Hoose, RN 05/27/2024, 1:01 PM

## 2024-05-27 NOTE — Progress Notes (Signed)
 PROGRESS NOTE  [name]:[id number] DOB: April 15, 1951 DOA: 05/26/2024 PCP: [name], MD   LOS: 1 day   Brief narrative:  Victor Price is a 73 y.o. male with medical history significant for CKD stage IIIb, HTN, BPH, erectile dysfunction, hypogonadism on testosterone  replacement, degenerative disc disease, bronchiectasis, and hyperthyroidism presented to hospital with chest pain for 4 to 5 days which was intermittent pressure-like without any shortness of breath.  In the ED patient had slightly elevated blood pressure.  Creatinine on presentation was 1.9 troponin 81 followed by 80.  EKG shows sinus rhythm with no ischemic changes. CXR showed no active disease. Pt received aspirin  324 mg x 1.  Cardiology was consulted and patient was advised hospital for further evaluation and treatment.    Assessment/Plan: Principal Problem:   NSTEMI (non-ST elevated myocardial infarction) [facility]) Active Problems:   Essential hypertension   CKD stage 3b, GFR 30-44 ml/min (HCC)   Precordial chest pain   Hyperthyroidism   History of Graves' disease   Prediabetes  Atypical chest pain 5 days of intermittent chest pain.  EKG without ischemic changes.  Troponin peak at 81.  Cardiology was consulted and recommend stress echocardiogram for further evaluation.  At this time patient has been started on oral Cardizem.  Cardiology will.  Will overnight and potentially home with outpatient stress test tomorrow 05/28/2024   Essential hypertension Continue HCTZ.  Bisoprolol  on hold as per cardiology recommendation.  Has been started on Cardizem.   CKD 3B Creatinine of 1.95 on presentation.  Continue to monitor closely.  Check BMP in AM.  Prediabetes Last hemoglobin A1c 5.3% 2 weeks ago.  Continue Farxiga    Hyperthyroidism with Hx of Graves' disease - TSH within normal limits at 2.32,  2 weeks ago.  Continue  methimazole     Hyperlipidemia Check lipid panel   Bronchiectasis - Chronic and  stable continue Anoro Ellipta , as needed DuoNebs   Hypogonadism - Continue testosterone  2% gel   Insomnia - Continue as needed trazodone    BPH Continue Flomax .   DVT prophylaxis: SCDs Start: 05/26/24 1119   Disposition: Home likely 03/19/2023.  Follow cardiology recommendations  Status is: Observation The patient will require care spanning > 2 midnights and should be moved to inpatient because: Need for stress echocardiogram and further cardiac workup    Code Status:     Code Status: Full Code  Family Communication: Spoke with the patient's spouse at bedside  Consultants: Cardiology  Procedures: None  Anti-infectives:  None  Anti-infectives (From admission, onward)    None        Subjective: Today, patient was seen and examined at bedside.  Patient denies any chest pain, shortness of breath or dyspnea.  Objective: Vitals:   05/27/24 0808 05/27/24 1238  BP: (!) 140/94 (!) 180/95  Pulse: 75 68  Resp: 18 17  Temp: 98.2 F (36.8 C) 98.2 F (36.8 C)  SpO2:     No intake or output data in the 24 hours ending 05/27/24 1420 Filed Weights   05/26/24 0859 05/26/24 1831  Weight: 81.2 kg 76.9 kg   Body mass index is 23 kg/m.   Physical Exam: GENERAL: Patient is alert awake and oriented. Not in obvious distress. HENT: No scleral pallor or icterus. Pupils equally reactive to light. Oral mucosa is moist NECK: is supple, no gross swelling noted. CHEST: Clear to auscultation. No crackles or wheezes.   CVS: S1 and S2 heard, no murmur. Regular rate and rhythm.  ABDOMEN: Soft,  non-tender, bowel sounds are present. EXTREMITIES: No edema. CNS: Cranial nerves are intact. No focal motor deficits. SKIN: warm and dry without rashes.  Data Review: I have personally reviewed the following laboratory data and studies,  CBC: Recent Labs  Lab 05/26/24 0903  WBC 6.0  HGB 13.3  HCT 39.0  MCV 90.3  PLT 321   Basic Metabolic Panel: Recent Labs  Lab 05/24/24 1625  05/26/24 0903  NA 138 137  K 4.0 4.0  CL 97 100  CO2 23 25  GLUCOSE 106* 99  BUN 22 22  CREATININE 2.06* 1.95*  CALCIUM  10.2 9.9   Liver Function Tests: Recent Labs  Lab 05/26/24 0903  AST 25  ALT 18  ALKPHOS 75  BILITOT 0.4  PROT 7.2  ALBUMIN  4.3   Recent Labs  Lab 05/26/24 0903  LIPASE 30   No results for input(s): AMMONIA in the last 168 hours. Cardiac Enzymes: No results for input(s): CKTOTAL, CKMB, CKMBINDEX, TROPONINI in the last 168 hours. BNP (last 3 results) No results for input(s): BNP in the last 8760 hours.  ProBNP (last 3 results) No results for input(s): PROBNP in the last 8760 hours.  CBG: No results for input(s): GLUCAP in the last 168 hours. No results found for this or any previous visit (from the past 240 hours).   Studies: DG Chest 2 View Result Date: 05/26/2024 EXAM: 2 VIEW(S) XRAY OF THE CHEST 05/26/2024 09:50:51 AM COMPARISON: CT February 15, 2022, Chest Radiograph August 18, 2022. CLINICAL HISTORY: Chest pain. Pt presenting with intermittent chest pain. He says that it has happened 6 times over a span of 3-4 days, lasting about 20 minutes each. States that it feels like someone is sitting on his chest with their knees. Not currently having chest pain. Denies sob, n/v, lightheadedness. Pain radiates to both arms down to his fingertip. FINDINGS: LUNGS AND PLEURA: Mild residual scarring left lower lobe. No superimposed confluent pulmonary infiltrate. No pneumothorax or pleural effusion. HEART AND MEDIASTINUM: No acute abnormality of the cardiac and mediastinal silhouettes. BONES AND SOFT TISSUES: No acute osseous abnormality. IMPRESSION: 1. No acute findings. 2. Mild residual scarring in the left lower lobe. Electronically signed by: Dorethia Molt MD 05/26/2024 09:55 AM EDT RP Workstation: [id number]      Vernal Alstrom, MD  [facility] 05/27/2024  If 7PM-7AM, please contact night-coverage

## 2024-05-27 NOTE — Care Management Obs Status (Signed)
 MEDICARE OBSERVATION STATUS NOTIFICATION   Patient Details  Name: Victor Price MRN: [id number] Date of Birth: July 29, 1951   Medicare Observation Status Notification Given:  Yes    Robynn Eileen Hoose, RN 05/27/2024, 1:01 PM

## 2024-05-27 NOTE — Progress Notes (Signed)
 PHARMACY - ANTICOAGULATION CONSULT NOTE  Pharmacy Consult for Heparin Indication: chest pain/ACS  Allergies  Allergen Reactions   Losartan  Other (See Comments)    Lightheadedness, dizziness, syncope   Metoprolol  Other (See Comments)    dizziness   Nsaids Other (See Comments)    CKD 3     Patient Measurements: Height: 6' (182.9 cm) Weight: 76.9 kg (169 lb 9.6 oz) IBW/kg (Calculated) : 77.6 HEPARIN DW (KG): 76.9  Vital Signs: Temp: 98.5 F (36.9 C) (10/19 1618) Temp Source: Oral (10/19 1618) BP: 148/94 (10/19 1618) Pulse Rate: 72 (10/19 1618)  Labs: Recent Labs    05/26/24 0903 05/27/24 1938  HGB 13.3  --   HCT 39.0  --   PLT 321  --   CREATININE 1.95*  --   TROPONINIHS  --  222*    Estimated Creatinine Clearance: 37.8 mL/min (A) (by C-G formula based on SCr of 1.95 mg/dL (H)).   Medical History: Past Medical History:  Diagnosis Date   BPH (benign prostatic hyperplasia)    Bronchiectasis with acute lower respiratory infection (HCC) 08/13/2019   Bronchitis    inhaler prn   Cancer (HCC)    skin cancer?   CKD (chronic kidney disease) stage 3, GFR 30-59 ml/min (HCC)    ED (erectile dysfunction)    GERD (gastroesophageal reflux disease)    HLD (hyperlipidemia)    Hypertension    Hyperthyroidism    Multinodular thyroid     Pneumonia    gets recurrent PNA in left lung, recent dx 08/13/19 left lower lobe   Seasonal allergies       Assessment: 73yom admitted with CP - worse this evening Trop bump 200  CBC stable - no anticoagulation PTA    Goal of Therapy:  Heparin level 0.3-0.7 units/ml Monitor platelets by anticoagulation protocol: Yes   Plan:  Heparin bolus 4000 uts IV x1  Heparin drip 1050 uts/hr  Daily Heparin level and CBC  Monitor s/s bleeding   Olam Chalk Pharm.D. CPP, BCPS Clinical Pharmacist [phone] 05/27/2024 9:37 PM

## 2024-05-28 ENCOUNTER — Observation Stay (HOSPITAL_BASED_OUTPATIENT_CLINIC_OR_DEPARTMENT_OTHER)

## 2024-05-28 ENCOUNTER — Telehealth: Payer: Self-pay | Admitting: Family Medicine

## 2024-05-28 DIAGNOSIS — N179 Acute kidney failure, unspecified: Secondary | ICD-10-CM | POA: Diagnosis not present

## 2024-05-28 DIAGNOSIS — I214 Non-ST elevation (NSTEMI) myocardial infarction: Secondary | ICD-10-CM

## 2024-05-28 DIAGNOSIS — E291 Testicular hypofunction: Secondary | ICD-10-CM

## 2024-05-28 DIAGNOSIS — N1832 Chronic kidney disease, stage 3b: Secondary | ICD-10-CM | POA: Diagnosis not present

## 2024-05-28 LAB — BASIC METABOLIC PANEL WITH GFR
Anion gap: 12 (ref 5–15)
BUN: 24 mg/dL — ABNORMAL HIGH (ref 8–23)
CO2: 22 mmol/L (ref 22–32)
Calcium: 9.3 mg/dL (ref 8.9–10.3)
Chloride: 102 mmol/L (ref 98–111)
Creatinine, Ser: 2.09 mg/dL — ABNORMAL HIGH (ref 0.61–1.24)
GFR, Estimated: 33 mL/min — ABNORMAL LOW (ref >60)
Glucose, Bld: 104 mg/dL — ABNORMAL HIGH (ref 70–99)
Potassium: 4.2 mmol/L (ref 3.5–5.1)
Sodium: 136 mmol/L (ref 135–145)

## 2024-05-28 LAB — CBC
HCT: 43.9 % (ref 39.0–52.0)
Hemoglobin: 14.6 g/dL (ref 13.0–17.0)
MCH: 30.2 pg (ref 26.0–34.0)
MCHC: 33.3 g/dL (ref 30.0–36.0)
MCV: 90.7 fL (ref 80.0–100.0)
Platelets: 328 K/uL (ref 150–400)
RBC: 4.84 MIL/uL (ref 4.22–5.81)
RDW: 13.5 % (ref 11.5–15.5)
WBC: 6.7 K/uL (ref 4.0–10.5)
nRBC: 0 % (ref 0.0–0.2)

## 2024-05-28 LAB — ECHOCARDIOGRAM COMPLETE
AR max vel: 2.71 cm2
AV Area VTI: 2.64 cm2
AV Area mean vel: 2.21 cm2
AV Mean grad: 5 mmHg
AV Peak grad: 7.6 mmHg
Ao pk vel: 1.38 m/s
Area-P 1/2: 3.12 cm2
Height: 72 in
S' Lateral: 2.9 cm
Weight: 2592 [oz_av]

## 2024-05-28 LAB — LIPID PANEL
Cholesterol: 279 mg/dL — ABNORMAL HIGH (ref 0–200)
HDL: 81 mg/dL (ref >40)
LDL Cholesterol: 179 mg/dL — ABNORMAL HIGH (ref 0–99)
Total CHOL/HDL Ratio: 3.4 ratio
Triglycerides: 97 mg/dL (ref <150)
VLDL: 19 mg/dL (ref 0–40)

## 2024-05-28 LAB — HEPARIN LEVEL (UNFRACTIONATED): Heparin Unfractionated: 0.33 [IU]/mL (ref 0.30–0.70)

## 2024-05-28 MED ORDER — TESTOSTERONE 12.5 MG/ACT (1%) TD GEL: TRANSDERMAL | 3 refills | Status: AC

## 2024-05-28 MED ORDER — FREE WATER
500.0000 mL | Freq: Once | Status: AC
  Administered 2024-05-29: 500 mL via ORAL

## 2024-05-28 MED ORDER — ASPIRIN 81 MG PO CHEW
81.0000 mg | CHEWABLE_TABLET | ORAL | Status: AC
  Administered 2024-05-29: 81 mg via ORAL
  Filled 2024-05-28: qty 1

## 2024-05-28 MED ORDER — ROSUVASTATIN CALCIUM 20 MG PO TABS
20.0000 mg | ORAL_TABLET | Freq: Every day | ORAL | Status: DC
  Administered 2024-05-28 – 2024-05-29 (×2): 20 mg via ORAL
  Filled 2024-05-28 (×2): qty 1

## 2024-05-28 MED ORDER — SODIUM CHLORIDE 0.9 % IV SOLN: INTRAVENOUS | Status: AC

## 2024-05-28 NOTE — Progress Notes (Signed)
 PHARMACY - ANTICOAGULATION CONSULT NOTE  Pharmacy Consult for Heparin Indication: chest pain/ACS  Allergies  Allergen Reactions   Losartan  Other (See Comments)    Lightheadedness, dizziness, syncope   Metoprolol  Other (See Comments)    dizziness   Nsaids Other (See Comments)    CKD 3    Patient Measurements: Height: 6' (182.9 cm) Weight: 76.9 kg (169 lb 9.6 oz) IBW/kg (Calculated) : 77.6 HEPARIN DW (KG): 76.9  Vital Signs: Temp: 98.7 F (37.1 C) (10/20 0446) Temp Source: Oral (10/20 0446) BP: 96/61 (10/20 0446) Pulse Rate: 71 (10/19 2302)  Labs: Recent Labs    05/26/24 0903 05/27/24 1938 05/27/24 2119 05/28/24 0428  HGB 13.3  --   --  14.6  HCT 39.0  --   --  43.9  PLT 321  --   --  328  HEPARINUNFRC  --   --   --  0.33  CREATININE 1.95*  --   --  2.09*  TROPONINIHS  --  222* 229*  --     Estimated Creatinine Clearance: 35.3 mL/min (A) (by C-G formula based on SCr of 2.09 mg/dL (H)).   Medical History: Past Medical History:  Diagnosis Date   BPH (benign prostatic hyperplasia)    Bronchiectasis with acute lower respiratory infection (HCC) 08/13/2019   Bronchitis    inhaler prn   Cancer (HCC)    skin cancer?   CKD (chronic kidney disease) stage 3, GFR 30-59 ml/min (HCC)    ED (erectile dysfunction)    GERD (gastroesophageal reflux disease)    HLD (hyperlipidemia)    Hypertension    Hyperthyroidism    Multinodular thyroid     Pneumonia    gets recurrent PNA in left lung, recent dx 08/13/19 left lower lobe   Seasonal allergies    Assessment: 73yom admitted with CP - worse this evening Trop bump 200  CBC stable - no anticoagulation PTA   10/20 AM update:  Heparin level therapeutic   Goal of Therapy:  Heparin level 0.3-0.7 units/ml Monitor platelets by anticoagulation protocol: Yes   Plan:  Cont heparin 1050 units/hr Heparin level in 6-8 hours  Lynwood Mckusick, PharmD, BCPS Clinical Pharmacist Phone: [phone]

## 2024-05-28 NOTE — Progress Notes (Signed)
 Progress Note  Patient Name: Victor Price Date of Encounter: 05/28/2024  Primary Cardiologist:   None   Subjective   He had chest pain again last night.  Currently pain free.    Inpatient Medications    Scheduled Meds:  aspirin  EC  81 mg Oral Daily   dapagliflozin  propanediol  10 mg Oral Daily   diltiazem  180 mg Oral Daily   fluticasone   2 spray Each Nare Daily   gabapentin   600 mg Oral BID   hydrochlorothiazide   6.25 mg Oral Daily   methimazole   10 mg Oral BID   tamsulosin   0.4 mg Oral BID   testosterone   5 g Transdermal Daily   umeclidinium-vilanterol  1 puff Inhalation Daily   Continuous Infusions:  heparin 1,050 Units/hr (05/28/24 0659)   PRN Meds: ipratropium, nitroGLYCERIN, traZODone    Vital Signs    Vitals:   05/27/24 2240 05/27/24 2302 05/28/24 0446 05/28/24 0816  BP: 138/77 (!) 142/88 96/61 (!) 87/79  Pulse: 76 71  98  Resp: 17 18 16 15   Temp: 98.2 F (36.8 C) 98.2 F (36.8 C) 98.7 F (37.1 C) 98.4 F (36.9 C)  TempSrc: Oral Oral Oral Oral  SpO2: 100% 100% 99% 98%  Weight:      Height:        Intake/Output Summary (Last 24 hours) at 05/28/2024 0846 Last data filed at 05/28/2024 0659 Gross per 24 hour  Intake 547.16 ml  Output 630 ml  Net -82.84 ml   Filed Weights   05/26/24 0859 05/26/24 1831  Weight: 81.2 kg 76.9 kg    Telemetry    NSR - Personally Reviewed  ECG    NA - Personally Reviewed  Physical Exam   GEN: No acute distress.   Neck: No  JVD Cardiac: RRR, no murmurs, rubs, or gallops.  Respiratory: Clear  to auscultation bilaterally. GI: Soft, nontender, non-distended  MS: No  edema; No deformity. Neuro:  Nonfocal  Psych: Normal affect   Labs    Chemistry Recent Labs  Lab 05/24/24 1625 05/26/24 0903 05/28/24 0428  NA 138 137 136  K 4.0 4.0 4.2  CL 97 100 102  CO2 23 25 22   GLUCOSE 106* 99 104*  BUN 22 22 24*  CREATININE 2.06* 1.95* 2.09*  CALCIUM  10.2 9.9 9.3  PROT  --  7.2  --   ALBUMIN   --  4.3   --   AST  --  25  --   ALT  --  18  --   ALKPHOS  --  75  --   BILITOT  --  0.4  --   GFRNONAA  --  36* 33*  ANIONGAP  --  12 12     Hematology Recent Labs  Lab 05/26/24 0903 05/28/24 0428  WBC 6.0 6.7  RBC 4.32 4.84  HGB 13.3 14.6  HCT 39.0 43.9  MCV 90.3 90.7  MCH 30.8 30.2  MCHC 34.1 33.3  RDW 13.4 13.5  PLT 321 328    Cardiac EnzymesNo results for input(s): TROPONINI in the last 168 hours. No results for input(s): TROPIPOC in the last 168 hours.   BNPNo results for input(s): BNP, PROBNP in the last 168 hours.   DDimer No results for input(s): DDIMER in the last 168 hours.   Radiology    DG Chest 2 View Result Date: 05/26/2024 EXAM: 2 VIEW(S) XRAY OF THE CHEST 05/26/2024 09:50:51 AM COMPARISON: CT February 15, 2022, Chest Radiograph August 18, 2022. CLINICAL  HISTORY: Chest pain. Pt presenting with intermittent chest pain. He says that it has happened 6 times over a span of 3-4 days, lasting about 20 minutes each. States that it feels like someone is sitting on his chest with their knees. Not currently having chest pain. Denies sob, n/v, lightheadedness. Pain radiates to both arms down to his fingertip. FINDINGS: LUNGS AND PLEURA: Mild residual scarring left lower lobe. No superimposed confluent pulmonary infiltrate. No pneumothorax or pleural effusion. HEART AND MEDIASTINUM: No acute abnormality of the cardiac and mediastinal silhouettes. BONES AND SOFT TISSUES: No acute osseous abnormality. IMPRESSION: 1. No acute findings. 2. Mild residual scarring in the left lower lobe. Electronically signed by: Dorethia Molt MD 05/26/2024 09:55 AM EDT RP Workstation: [id number]    Cardiac Studies   Echo pending  Patient Profile     73 y.o. male  with a hx of CKD stage IIIb, HTN, BPH, erectile dysfunction, hypogonadism on testosterone  replacement, degenerative disc disease, bronchiectasis, and hyperthyroidism who is being seen 05/26/2024 for the evaluation of chest pain.    Assessment & Plan    Chest pain:   Recurrent resting pain with elevated cardiac enzymes.    Unstable angina and NQWMI.  Long discussion with the patient.  High pretest probability of obstructive CAD.  Needs cardiac cath.  The patient understands that risks included but are not limited to stroke (1 in 1000), death (1 in 1000), kidney failure [usually temporary] (1 in 500), bleeding (1 in 200), allergic reaction [possibly serious] (1 in 200).  The patient understands and agrees to proceed.    I did discuss the increased risk of renal insufficiency.   Holding hydrochlorothiazide  and Farxiga   CKD IIIB:  Limit dye and no LV gram.   Echo pending  HTN:  BP was elevated but is down.  Follow  Dyslipidemia:  Starting Crestor .     For questions or updates, please contact [facility] Please consult [email or web address] for contact info under Cardiology/STEMI.   Signed, Lynwood Schilling, MD  05/28/2024, 8:46 AM

## 2024-05-28 NOTE — Progress Notes (Signed)
 PROGRESS NOTE  Victor Price FMW:[id number] DOB: 1950/08/31 DOA: 05/26/2024 PCP: [name], MD   LOS: 1 day   Brief narrative:  Victor Price is a 73 y.o. male with medical history significant for CKD stage IIIb, HTN, BPH, erectile dysfunction, hypogonadism on testosterone  replacement, degenerative disc disease, bronchiectasis, and hyperthyroidism presented to hospital with chest pain for 4 to 5 days which was intermittent pressure-like without any shortness of breath.  In the ED patient had slightly elevated blood pressure.  Creatinine on presentation was 1.9 troponin 81 followed by 80.  EKG shows sinus rhythm with no ischemic changes. CXR showed no active disease. Pt received aspirin  324 mg x 1.  Cardiology was consulted and patient was advised hospital for further evaluation and treatment.    Assessment/Plan: Principal Problem:   NSTEMI (non-ST elevated myocardial infarction) [facility]) Active Problems:   Essential hypertension   CKD stage 3b, GFR 30-44 ml/min (HCC)   Precordial chest pain   Hyperthyroidism   History of Graves' disease   Prediabetes  Chest pain. 5 days of intermittent chest pain.  EKG without ischemic changes.  Troponin peak at 81.  Cardiology was consulted and recommend stress echocardiogram initially but then patient had significant episode of chest pain yesterday evening.  Cardiology was notified and was started on heparin drip.  There was some concern for vasospasm so Cardizem was initiated.  Today cardiology has an impression that patient might need cardiac catheterization for possible unstable angina.  Patient has elevated creatinine show Farxiga  and HCTZ will be on hold.  Follow cardiology recommendation.  Check 2D echocardiogram.  Essential hypertension Hold HCTZ.  Bisoprolol  on hold as per cardiology recommendation.  Patient currently on Cardizem.   CKD 3B Creatinine of 1.95 on presentation and now slightly trended up to 2.0..  Continue to monitor  closely.  Check BMP in AM.  Hold HCTZ for now.  Prediabetes Last hemoglobin A1c 5.3% 2 weeks ago.  Hold Farxiga .   Hyperthyroidism with Hx of Graves' disease - TSH within normal limits at 2.32,  2 weeks ago.  Continue methimazole     Hyperlipidemia Lipid panel 6 months back was with LDL of 167.  Will repeat lipid panel.   Bronchiectasis - Chronic and stable continue Anoro Ellipta , as needed DuoNebs.  No acute issues.   Hypogonadism On testosterone  2% gel   Insomnia - Continue as needed trazodone    BPH Continue Flomax .   DVT prophylaxis: SCDs Start: 05/26/24 1119   Disposition: Home likely in 1 to 2 days  Status is: Observation The patient will require care spanning > 2 midnights and should be moved to inpatient because: Need for further cardiac workup including possible cardiac catheterization   Code Status:     Code Status: Full Code  Family Communication: Spoke with the patient's spouse at bedside on 05/27/2024  Consultants: Cardiology  Procedures: None  Anti-infectives:  None  Anti-infectives (From admission, onward)    None        Subjective: Today, patient was seen and examined at bedside.  Patient denies current chest pain or shortness of breath but had significant chest pain yesterday with resolved on its own   Objective: Vitals:   05/28/24 1100 05/28/24 1104  BP: (!) 148/86   Pulse: 87 84  Resp: 16   Temp: 98.3 F (36.8 C)   SpO2:  100%    Intake/Output Summary (Last 24 hours) at 05/28/2024 1142 Last data filed at 05/28/2024 0659 Gross per 24 hour  Intake 547.16  ml  Output 380 ml  Net 167.16 ml   Filed Weights   05/26/24 0859 05/26/24 1831 05/28/24 1104  Weight: 81.2 kg 76.9 kg 73.5 kg   Body mass index is 21.97 kg/m.   Physical Exam: GENERAL: Patient is alert awake and oriented. Not in obvious distress. HENT: No scleral pallor or icterus. Pupils equally reactive to light. Oral mucosa is moist NECK: is supple, no gross  swelling noted. CHEST: Clear to auscultation. No crackles or wheezes.   CVS: S1 and S2 heard, no murmur. Regular rate and rhythm.  ABDOMEN: Soft, non-tender, bowel sounds are present. EXTREMITIES: No edema. CNS: Cranial nerves are intact. No focal motor deficits. SKIN: warm and dry without rashes.  Data Review: I have personally reviewed the following laboratory data and studies,  CBC: Recent Labs  Lab 05/26/24 0903 05/28/24 0428  WBC 6.0 6.7  HGB 13.3 14.6  HCT 39.0 43.9  MCV 90.3 90.7  PLT 321 328   Basic Metabolic Panel: Recent Labs  Lab 05/24/24 1625 05/26/24 0903 05/28/24 0428  NA 138 137 136  K 4.0 4.0 4.2  CL 97 100 102  CO2 23 25 22   GLUCOSE 106* 99 104*  BUN 22 22 24*  CREATININE 2.06* 1.95* 2.09*  CALCIUM  10.2 9.9 9.3   Liver Function Tests: Recent Labs  Lab 05/26/24 0903  AST 25  ALT 18  ALKPHOS 75  BILITOT 0.4  PROT 7.2  ALBUMIN  4.3   Recent Labs  Lab 05/26/24 0903  LIPASE 30   No results for input(s): AMMONIA in the last 168 hours. Cardiac Enzymes: No results for input(s): CKTOTAL, CKMB, CKMBINDEX, TROPONINI in the last 168 hours. BNP (last 3 results) No results for input(s): BNP in the last 8760 hours.  ProBNP (last 3 results) No results for input(s): PROBNP in the last 8760 hours.  CBG: No results for input(s): GLUCAP in the last 168 hours. No results found for this or any previous visit (from the past 240 hours).   Studies: No results found.    Valentine Barney, MD  [facility] 05/28/2024  If 7PM-7AM, please contact night-coverage

## 2024-05-28 NOTE — Telephone Encounter (Signed)
 Received notification from the pharmacy that the manufacturer is unable to supply his usual testosterone  so they have recommended switching to the 12.5 mg gel pump or to change to the tube   Meds ordered this encounter  Medications   Testosterone  12.5 MG/ACT (1%) GEL    Sig: APPLY 1 PUMP TO AN INNER THIGH AND 1 PUMPS TO OPPOSITE INNER THIGH IN AM    Dispense:  75 g    Refill:  3

## 2024-05-29 ENCOUNTER — Encounter (HOSPITAL_COMMUNITY)
Admission: EM | Disposition: A | Discharge status: Home / Self Care | Payer: Self-pay | Source: Home / Self Care | Attending: Thoracic Surgery (Cardiothoracic Vascular Surgery)

## 2024-05-29 DIAGNOSIS — E0590 Thyrotoxicosis, unspecified without thyrotoxic crisis or storm: Secondary | ICD-10-CM | POA: Diagnosis not present

## 2024-05-29 DIAGNOSIS — N179 Acute kidney failure, unspecified: Secondary | ICD-10-CM | POA: Diagnosis not present

## 2024-05-29 DIAGNOSIS — I130 Hypertensive heart and chronic kidney disease with heart failure and stage 1 through stage 4 chronic kidney disease, or unspecified chronic kidney disease: Secondary | ICD-10-CM | POA: Diagnosis not present

## 2024-05-29 DIAGNOSIS — I129 Hypertensive chronic kidney disease with stage 1 through stage 4 chronic kidney disease, or unspecified chronic kidney disease: Secondary | ICD-10-CM | POA: Diagnosis not present

## 2024-05-29 DIAGNOSIS — N183 Chronic kidney disease, stage 3 unspecified: Secondary | ICD-10-CM | POA: Diagnosis not present

## 2024-05-29 DIAGNOSIS — E871 Hypo-osmolality and hyponatremia: Secondary | ICD-10-CM | POA: Diagnosis not present

## 2024-05-29 DIAGNOSIS — I319 Disease of pericardium, unspecified: Secondary | ICD-10-CM | POA: Diagnosis not present

## 2024-05-29 DIAGNOSIS — J479 Bronchiectasis, uncomplicated: Secondary | ICD-10-CM | POA: Diagnosis not present

## 2024-05-29 DIAGNOSIS — D62 Acute posthemorrhagic anemia: Secondary | ICD-10-CM | POA: Diagnosis not present

## 2024-05-29 DIAGNOSIS — E039 Hypothyroidism, unspecified: Secondary | ICD-10-CM | POA: Diagnosis not present

## 2024-05-29 DIAGNOSIS — I2510 Atherosclerotic heart disease of native coronary artery without angina pectoris: Secondary | ICD-10-CM | POA: Diagnosis not present

## 2024-05-29 DIAGNOSIS — E785 Hyperlipidemia, unspecified: Secondary | ICD-10-CM

## 2024-05-29 DIAGNOSIS — I5031 Acute diastolic (congestive) heart failure: Secondary | ICD-10-CM | POA: Diagnosis not present

## 2024-05-29 DIAGNOSIS — N1832 Chronic kidney disease, stage 3b: Secondary | ICD-10-CM | POA: Diagnosis not present

## 2024-05-29 DIAGNOSIS — I214 Non-ST elevation (NSTEMI) myocardial infarction: Secondary | ICD-10-CM | POA: Diagnosis not present

## 2024-05-29 DIAGNOSIS — I4891 Unspecified atrial fibrillation: Secondary | ICD-10-CM | POA: Diagnosis not present

## 2024-05-29 HISTORY — PX: LEFT HEART CATH AND CORONARY ANGIOGRAPHY: CATH118249

## 2024-05-29 HISTORY — PX: CORONARY PRESSURE/FFR WITH 3D MAPPING: CATH118309

## 2024-05-29 LAB — BASIC METABOLIC PANEL WITH GFR
Anion gap: 9 (ref 5–15)
Anion gap: 8 (ref 5–15)
BUN: 33 mg/dL — ABNORMAL HIGH (ref 8–23)
BUN: 28 mg/dL — ABNORMAL HIGH (ref 8–23)
CO2: 24 mmol/L (ref 22–32)
CO2: 22 mmol/L (ref 22–32)
Calcium: 8.8 mg/dL — ABNORMAL LOW (ref 8.9–10.3)
Calcium: 8.7 mg/dL — ABNORMAL LOW (ref 8.9–10.3)
Chloride: 105 mmol/L (ref 98–111)
Chloride: 108 mmol/L (ref 98–111)
Creatinine, Ser: 2.31 mg/dL — ABNORMAL HIGH (ref 0.61–1.24)
Creatinine, Ser: 2.03 mg/dL — ABNORMAL HIGH (ref 0.61–1.24)
GFR, Estimated: 29 mL/min — ABNORMAL LOW (ref >60)
GFR, Estimated: 34 mL/min — ABNORMAL LOW (ref >60)
Glucose, Bld: 120 mg/dL — ABNORMAL HIGH (ref 70–99)
Glucose, Bld: 100 mg/dL — ABNORMAL HIGH (ref 70–99)
Potassium: 4.5 mmol/L (ref 3.5–5.1)
Potassium: 4.3 mmol/L (ref 3.5–5.1)
Sodium: 138 mmol/L (ref 135–145)
Sodium: 138 mmol/L (ref 135–145)

## 2024-05-29 LAB — CBC
HCT: 41.9 % (ref 39.0–52.0)
Hemoglobin: 13.7 g/dL (ref 13.0–17.0)
MCH: 30.3 pg (ref 26.0–34.0)
MCHC: 32.7 g/dL (ref 30.0–36.0)
MCV: 92.7 fL (ref 80.0–100.0)
Platelets: 328 K/uL (ref 150–400)
RBC: 4.52 MIL/uL (ref 4.22–5.81)
RDW: 13.6 % (ref 11.5–15.5)
WBC: 5.8 K/uL (ref 4.0–10.5)
nRBC: 0 % (ref 0.0–0.2)

## 2024-05-29 LAB — LIPID PANEL
Cholesterol: 247 mg/dL — ABNORMAL HIGH (ref 0–200)
HDL: 70 mg/dL (ref >40)
LDL Cholesterol: 150 mg/dL — ABNORMAL HIGH (ref 0–99)
Total CHOL/HDL Ratio: 3.5 ratio
Triglycerides: 133 mg/dL (ref <150)
VLDL: 27 mg/dL (ref 0–40)

## 2024-05-29 LAB — MAGNESIUM: Magnesium: 2.3 mg/dL (ref 1.7–2.4)

## 2024-05-29 LAB — HEPARIN LEVEL (UNFRACTIONATED): Heparin Unfractionated: 0.27 [IU]/mL — ABNORMAL LOW (ref 0.30–0.70)

## 2024-05-29 SURGERY — LEFT HEART CATH AND CORONARY ANGIOGRAPHY
Anesthesia: LOCAL

## 2024-05-29 MED ORDER — ACETAMINOPHEN 325 MG PO TABS: 650.0000 mg | ORAL_TABLET | ORAL | Status: DC | PRN

## 2024-05-29 MED ORDER — ONDANSETRON HCL 4 MG/2ML IJ SOLN
4.0000 mg | Freq: Four times a day (QID) | INTRAMUSCULAR | Status: DC | PRN
Start: 2024-05-29 — End: 2024-05-31

## 2024-05-29 MED ORDER — VERAPAMIL HCL 2.5 MG/ML IV SOLN
INTRAVENOUS | Status: AC
Start: 2024-05-29 — End: 2024-05-29
  Filled 2024-05-29: qty 2

## 2024-05-29 MED ORDER — MIDAZOLAM HCL 2 MG/2ML IJ SOLN
INTRAMUSCULAR | Status: AC
  Filled 2024-05-29: qty 2

## 2024-05-29 MED ORDER — SODIUM CHLORIDE 0.9 % IV SOLN: INTRAVENOUS | Status: DC

## 2024-05-29 MED ORDER — MORPHINE SULFATE (PF) 2 MG/ML IV SOLN: 2.0000 mg | INTRAVENOUS | Status: DC | PRN

## 2024-05-29 MED ORDER — SODIUM CHLORIDE 0.9 % IV SOLN: INTRAVENOUS | Status: AC

## 2024-05-29 MED ORDER — HEPARIN SODIUM (PORCINE) 1000 UNIT/ML IJ SOLN
INTRAMUSCULAR | Status: DC | PRN
  Administered 2024-05-29: 3500 [IU] via INTRAVENOUS

## 2024-05-29 MED ORDER — SODIUM CHLORIDE 0.9% FLUSH: 3.0000 mL | INTRAVENOUS | Status: DC | PRN

## 2024-05-29 MED ORDER — MIDAZOLAM HCL (PF) 2 MG/2ML IJ SOLN
INTRAMUSCULAR | Status: DC | PRN
  Administered 2024-05-29 (×2): 1 mg via INTRAVENOUS

## 2024-05-29 MED ORDER — LIDOCAINE HCL (PF) 1 % IJ SOLN
INTRAMUSCULAR | Status: DC | PRN
  Administered 2024-05-29: 2 mL via INTRADERMAL

## 2024-05-29 MED ORDER — SODIUM CHLORIDE 0.9 % IV SOLN: 250.0000 mL | INTRAVENOUS | Status: AC | PRN

## 2024-05-29 MED ORDER — LIDOCAINE HCL (PF) 1 % IJ SOLN
INTRAMUSCULAR | Status: AC
  Filled 2024-05-29: qty 30

## 2024-05-29 MED ORDER — HEPARIN (PORCINE) 25000 UT/250ML-% IV SOLN
1400.0000 [IU]/h | INTRAVENOUS | Status: DC
  Administered 2024-05-29: 1200 [IU]/h via INTRAVENOUS
  Administered 2024-05-30: 1400 [IU]/h via INTRAVENOUS
  Filled 2024-05-29 (×2): qty 250

## 2024-05-29 MED ORDER — IOHEXOL 350 MG/ML SOLN
INTRAVENOUS | Status: DC | PRN
  Administered 2024-05-29: 45 mL

## 2024-05-29 MED ORDER — FENTANYL CITRATE (PF) 100 MCG/2ML IJ SOLN
INTRAMUSCULAR | Status: AC
  Filled 2024-05-29: qty 2

## 2024-05-29 MED ORDER — SODIUM CHLORIDE 0.9% FLUSH
3.0000 mL | Freq: Two times a day (BID) | INTRAVENOUS | Status: DC
Start: 2024-05-29 — End: 2024-05-31
  Administered 2024-05-29 – 2024-05-30 (×3): 3 mL via INTRAVENOUS

## 2024-05-29 MED ORDER — HYDRALAZINE HCL 20 MG/ML IJ SOLN: 10.0000 mg | INTRAMUSCULAR | Status: AC | PRN

## 2024-05-29 MED ORDER — HEPARIN (PORCINE) IN NACL 2-0.9 UNITS/ML
INTRAMUSCULAR | Status: DC | PRN
  Administered 2024-05-29: 10 mL via INTRA_ARTERIAL

## 2024-05-29 MED ORDER — FENTANYL CITRATE (PF) 100 MCG/2ML IJ SOLN
INTRAMUSCULAR | Status: DC | PRN
  Administered 2024-05-29 (×2): 25 ug via INTRAVENOUS

## 2024-05-29 MED ORDER — HEPARIN SODIUM (PORCINE) 1000 UNIT/ML IJ SOLN
INTRAMUSCULAR | Status: AC
  Filled 2024-05-29: qty 10

## 2024-05-29 SURGICAL SUPPLY — 9 items
CARD KEY FFR CATHWORX (MISCELLANEOUS) IMPLANT
CATH INFINITI AMBI 5FR TG (CATHETERS) IMPLANT
DEVICE RAD COMP TR BAND LRG (VASCULAR PRODUCTS) IMPLANT
GLIDESHEATH SLEND SS 6F .021 (SHEATH) IMPLANT
GUIDEWIRE INQWIRE 1.5J.035X260 (WIRE) IMPLANT
KIT SYRINGE INJ CVI SPIKEX1 (MISCELLANEOUS) IMPLANT
PACK CARDIAC CATHETERIZATION (CUSTOM PROCEDURE TRAY) ×2 IMPLANT
SET ATX-X65L (MISCELLANEOUS) IMPLANT
SHEATH PROBE COVER 6X72 (BAG) IMPLANT

## 2024-05-29 NOTE — Progress Notes (Signed)
 PHARMACY - ANTICOAGULATION CONSULT NOTE  Pharmacy Consult for Heparin Indication: chest pain/ACS  Allergies  Allergen Reactions   Losartan  Other (See Comments)    Lightheadedness, dizziness, syncope   Metoprolol  Other (See Comments)    dizziness   Nsaids Other (See Comments)    CKD 3    Patient Measurements: Height: 6' (182.9 cm) Weight: 73.5 kg (162 lb) IBW/kg (Calculated) : 77.6 HEPARIN DW (KG): 76.9  Vital Signs: Temp: 98.6 F (37 C) (10/21 0445) Temp Source: Oral (10/21 0445) BP: 127/72 (10/21 0445) Pulse Rate: 73 (10/21 0445)  Labs: Recent Labs    05/26/24 0903 05/27/24 1938 05/27/24 2119 05/28/24 0428 05/29/24 0403  HGB 13.3  --   --  14.6 13.7  HCT 39.0  --   --  43.9 41.9  PLT 321  --   --  328 328  HEPARINUNFRC  --   --   --  0.33 0.27*  CREATININE 1.95*  --   --  2.09* 2.31*  TROPONINIHS  --  222* 229*  --   --     Estimated Creatinine Clearance: 30.5 mL/min (A) (by C-G formula based on SCr of 2.31 mg/dL (H)).   Medical History: Past Medical History:  Diagnosis Date   BPH (benign prostatic hyperplasia)    Bronchiectasis with acute lower respiratory infection (HCC) 08/13/2019   Bronchitis    inhaler prn   Cancer (HCC)    skin cancer?   CKD (chronic kidney disease) stage 3, GFR 30-59 ml/min (HCC)    ED (erectile dysfunction)    GERD (gastroesophageal reflux disease)    HLD (hyperlipidemia)    Hypertension    Hyperthyroidism    Multinodular thyroid     Pneumonia    gets recurrent PNA in left lung, recent dx 08/13/19 left lower lobe   Seasonal allergies    Assessment: 36 yoM admitted with CP. Pharmacy dosing IV heparin. No AC PTA.   Heparin level slightly below goal at 0.27, CBC stable.   Goal of Therapy:  Heparin level 0.3-0.7 units/ml Monitor platelets by anticoagulation protocol: Yes   Plan:  Increase heparin to 1200 units/h Daily heparin level and CBC  Ozell Jamaica, PharmD, [place], [facility] Clinical Pharmacist [phone] Please  check AMION for all [facility] Pharmacy numbers 05/29/2024

## 2024-05-29 NOTE — H&P (View-Only) (Signed)
 [address]       [place] [address]             [phone]        VIET KEMMERER [facility] Medical Record #[id number] Date of Birth: [birth year]  Referring: Anner Primary Care: [name], MD Primary Cardiologist:None  Chief Complaint:    Chief Complaint  Patient presents with   Chest Pain   History of Present Illness:      Juris Gosnell is 73 yo male with known history of HTN, CKD Stage 3, Hypothyroidism/Graves Disease, HLD, and BPH.  The patient presented to an Urgent Care on 10/17 with complaints of chest pain that he described as someone having knees in his chest.  This had been occurring over several days.  He denied associated shortness of breath.  EKG was obtained and showed NSR that was identical to previous EKG on file.  He was discharged, but instructed to report to the ED should his pain worsen.  On 10/18 he presented to [facility] Emergency Department with complaints of intermittent chest pain.  He complained that episodes had occurred 6 times over the past 3-4 days and lasted about 20 min each time.  He again denied shortness of breath, N/V, and lightheadedness.  EKG remained normal with minimal elevation in Troponin level to 81.  Cardiology was consulted and recommended admission to medicine service.  Due to patient's baseline creatinine of 1.95 CTA was not recommended.  His troponin levels increased to over 200.  Due to this it was recommended he undergo catheterization which was performed today and showed CAD.  Cardiothoracic surgery consultation was requested.  The patient is currently chest pain free.  He notes that he has been experiencing episodes at rest.  He states he just lifted a bunch of pallets and had no issues.  He is a non-smoker.  He does not drink alcohol.  He denies family history of CAD.  He is retired, but is very active.  Current Activity/ Functional Status: Patient is independent with mobility/ambulation, transfers,  ADL's, IADL's.   Zubrod Score:  At the time of surgery this patient's most appropriate activity status/level should be described as: []     0    Normal activity, no symptoms [x]     1    Restricted in physical strenuous activity but ambulatory, able to do out light work []     2    Ambulatory and capable of self care, unable to do work activities, up and about                 more than 50%  Of the time                            []     3    Only limited self care, in bed greater than 50% of waking hours []     4    Completely disabled, no self care, confined to bed or chair []     5    Moribund  Past Medical History:  Diagnosis Date   BPH (benign prostatic hyperplasia)    Bronchiectasis with acute lower respiratory infection (HCC) 08/13/2019   Bronchitis    inhaler prn   Cancer (HCC)    skin cancer?   CKD (chronic kidney disease) stage 3, GFR 30-59 ml/min (HCC)    ED (erectile dysfunction)    GERD (gastroesophageal reflux disease)  HLD (hyperlipidemia)    Hypertension    Hyperthyroidism    Multinodular thyroid     Pneumonia    gets recurrent PNA in left lung, recent dx 08/13/19 left lower lobe   Seasonal allergies     Past Surgical History:  Procedure Laterality Date   BACK SURGERY  1980s   [facility]   COLONOSCOPY  2004, 2012   several - polyps   MULTIPLE TOOTH EXTRACTIONS     TOTAL HIP ARTHROPLASTY Left 08/24/2019   Procedure: LEFT TOTAL HIP ARTHROPLASTY-DIRECT ANTERIOR;  Surgeon: Barbarann Oneil BROCKS, MD;  Location: [facility] OR;  Service: Orthopedics;  Laterality: Left;   TOTAL HIP ARTHROPLASTY Right 08/26/2023   Procedure: RIGHT TOTAL HIP ARTHROPLASTY;  Surgeon: Barbarann Oneil BROCKS, MD;  Location: [facility] OR;  Service: Orthopedics;  Laterality: Right;  Needs RNFA    Social History   Tobacco Use  Smoking Status Never  Smokeless Tobacco Never    Social History   Substance and Sexual Activity  Alcohol Use No     Allergies  Allergen Reactions   Losartan  Other (See Comments)    Lightheadedness,  dizziness, syncope   Metoprolol  Other (See Comments)    dizziness   Nsaids Other (See Comments)    CKD 3     Current Facility-Administered Medications  Medication Dose Route Frequency Provider Last Rate Last Admin   0.9 %  sodium chloride  infusion   Intravenous Continuous Anner Alm ORN, MD 100 mL/hr at 05/29/24 0619 New Bag at 05/29/24 0619   0.9 %  sodium chloride  infusion   Intravenous Continuous Anner Alm ORN, MD 75 mL/hr at 05/29/24 1622 New Bag at 05/29/24 1622   0.9 %  sodium chloride  infusion  250 mL Intravenous PRN Anner Alm ORN, MD       acetaminophen  (TYLENOL ) tablet 650 mg  650 mg Oral Q4H PRN Anner Alm ORN, MD       aspirin  EC tablet 81 mg  81 mg Oral Daily Shona Laurence N, DO   81 mg at 05/28/24 1057   diltiazem (CARDIZEM CD) 24 hr capsule 180 mg  180 mg Oral Daily Salah, Husam M, MD   180 mg at 05/29/24 1036   fluticasone  (FLONASE ) 50 MCG/ACT nasal spray 2 spray  2 spray Each Nare Daily Lou Claretta HERO, MD   2 spray at 05/29/24 1045   gabapentin  (NEURONTIN ) capsule 600 mg  600 mg Oral BID Lou Claretta HERO, MD   600 mg at 05/29/24 1033   [START ON 05/30/2024] heparin ADULT infusion 100 units/mL (25000 units/250mL)  1,200 Units/hr Intravenous Continuous Williamson, Erin R, RPH       hydrALAZINE (APRESOLINE) injection 10 mg  10 mg Intravenous Q20 Min PRN Anner Alm ORN, MD       ipratropium (ATROVENT ) 0.06 % nasal spray 2 spray  2 spray Each Nare BID PRN Lou Claretta HERO, MD       methimazole  (TAPAZOLE ) tablet 10 mg  10 mg Oral BID Amponsah, Prosper M, MD   10 mg at 05/29/24 1044   morphine  (PF) 2 MG/ML injection 2 mg  2 mg Intravenous Q2H PRN Anner Alm ORN, MD       nitroGLYCERIN (NITROSTAT) SL tablet 0.4 mg  0.4 mg Sublingual Q5 min PRN Shona Laurence N, DO       ondansetron  (ZOFRAN ) injection 4 mg  4 mg Intravenous Q6H PRN Anner Alm ORN, MD       rosuvastatin  (CRESTOR ) tablet 20 mg  20 mg Oral Daily Lavona Agent,  MD   20 mg at 05/29/24 1033    sodium chloride  flush (NS) 0.9 % injection 3 mL  3 mL Intravenous Q12H Anner Alm ORN, MD       sodium chloride  flush (NS) 0.9 % injection 3 mL  3 mL Intravenous PRN Anner Alm ORN, MD       tamsulosin  (FLOMAX ) capsule 0.4 mg  0.4 mg Oral BID Amponsah, Prosper M, MD   0.4 mg at 05/29/24 1035   testosterone  (ANDROGEL ) 50 MG/5GM (1%) gel 5 g  5 g Transdermal Daily Amponsah, Prosper M, MD   5 g at 05/28/24 1059   traZODone  (DESYREL ) tablet 25-100 mg  25-100 mg Oral QHS PRN Amponsah, Prosper M, MD   50 mg at 05/28/24 2122   umeclidinium-vilanterol (ANORO ELLIPTA ) 62.5-25 MCG/ACT 1 puff  1 puff Inhalation Daily Lou Claretta HERO, MD   1 puff at 05/29/24 0830    Medications Prior to Admission  Medication Sig Dispense Refill Last Dose/Taking   bisoprolol -hydrochlorothiazide  (ZIAC ) 10-6.25 MG tablet TAKE 1 TABLET BY MOUTH EVERY DAY 90 tablet 0 05/25/2024   [facility] ALL[facility]  RELIEF-D 10-240 MG 24 hr tablet TAKE 1 TABLET BY MOUTH EVERY DAY 30 tablet 10 05/25/2024   FARXIGA  10 MG TABS tablet TAKE 1 TABLET BY MOUTH EVERY DAY 90 tablet 2 05/25/2024   fluticasone  (FLONASE ) 50 MCG/ACT nasal spray Place 2 sprays into both nostrils daily. (Patient taking differently: Place 2 sprays into both nostrils daily as needed for allergies.) 16 g 6 Unknown   gabapentin  (NEURONTIN ) 600 MG tablet Take 1 tablet (600 mg total) by mouth 2 (two) times daily. (Patient taking differently: Take 300 mg by mouth 2 (two) times daily.) 180 tablet 1 05/25/2024   ipratropium (ATROVENT ) 0.03 % nasal spray Place 2 sprays into both nostrils 2 (two) times daily as needed for rhinitis. For runny nose (Patient taking differently: Place 2 sprays into both nostrils daily as needed for rhinitis (runny nose).) 30 mL 12 Unknown   methimazole  (TAPAZOLE ) 10 MG tablet TAKE 1 TABLET BY MOUTH TWICE A DAY (Patient taking differently: Take 10 mg by mouth daily.) 180 tablet 1 05/25/2024   sildenafil  (REVATIO ) 20 MG tablet TAKE 2 TO 5 TABLETS BY MOUTH DAILY AS  NEEDED (Patient taking differently: Take 20 mg by mouth daily.) 60 tablet 5 05/25/2024   tamsulosin  (FLOMAX ) 0.4 MG CAPS capsule TAKE 1 CAPSULE (0.4 MG TOTAL) BY MOUTH IN THE MORNING AND AT BEDTIME 180 capsule 3 05/25/2024   traZODone  (DESYREL ) 50 MG tablet TAKE 1/2 TO 2 TABLETS BY MOUTH AT BEDTIME AS NEEDED SLEEP (Patient taking differently: Take 100 mg by mouth at bedtime.) 180 tablet 3 05/25/2024   triamcinolone  cream (KENALOG ) 0.1 % Apply 1 Application topically 2 (two) times daily as needed (Rash).   Unknown   umeclidinium-vilanterol (ANORO ELLIPTA ) 62.5-25 MCG/ACT AEPB Inhale 1 puff into the lungs daily. 3 each 1 05/25/2024   Testosterone  12.5 MG/ACT (1%) GEL APPLY 1 PUMP TO AN INNER THIGH AND 1 PUMPS TO OPPOSITE INNER THIGH IN AM 75 g 3     Family History  Problem Relation Age of Onset   Dementia Mother    Healthy Father    Review of Systems:   ROS    Cardiac Review of Systems: Y or  [    ]= no  Chest Pain [  Y at rest, currently resolved  ]  Resting SOB [   ] Exertional SOB  [ N ]  Orthopnea [  ]  Pedal Edema [ N  ]    Palpitations [ N ] Syncope  [  ]   Presyncope [   ]  General Review of Systems: [Y] = yes [  ]=no Constitional: recent weight change [  ]; anorexia [  ]; fatigue [ Y ]; nausea Discordia.Diesel  ]; night sweats [  ]; fever [  ]; or chills [  ]                                                               Dental: Last Dentist visit:   Eye : blurred vision [  ]; diplopia [   ]; vision changes [  ];  Amaurosis fugax[  ]; Resp: cough [ N ];  wheezing[  ];  hemoptysis[  ]; shortness of breath[ N ]; paroxysmal nocturnal dyspnea[  ]; dyspnea on exertion[ N ]; or orthopnea[  ];  GI:  gallstones[  ], vomiting[  ];  dysphagia[  ]; melena[  ];  hematochezia [  ]; heartburn[  ];   Hx of  Colonoscopy[  ]; GU: kidney stones [  ]; hematuria[  ];   dysuria [  ];  nocturia[  ];  history of     obstruction [  ]; urinary frequency [  ]             Skin: rash, swelling[ N ];, hair loss[  ];   peripheral edema[N  ];  or itching[  ]; Musculosketetal: myalgias[  ];  joint swelling[  ];  joint erythema[  ];  joint pain[  ];  back pain[  ];  Heme/Lymph: bruising[  ];  bleeding[  ];  anemia[  ];  Neuro: TIA[  ];  headaches[  ];  stroke[ N ];  vertigo[  ];  seizures[  ];   paresthesias[  ];  difficulty walking[N  ];  Psych:depression[  ]; anxiety[  ];  Endocrine: diabetes[ N ];  thyroid  dysfunction[ Y ];  Physical Exam: BP (!) 156/82   Pulse 78   Temp 98 F (36.7 C) (Oral)   Resp 16   Ht 6' (1.829 m)   Wt 73.5 kg   SpO2 100%   BMI 21.97 kg/m   General appearance: alert, cooperative, and no distress Head: Normocephalic, without obvious abnormality, atraumatic Neck: no adenopathy, no carotid bruit, no JVD, supple, symmetrical, trachea midline, and thyroid  not enlarged, symmetric, no tenderness/mass/nodules Resp: clear to auscultation bilaterally Cardio: regular rate and rhythm GI: soft, non-tender; bowel sounds normal; no masses,  no organomegaly Extremities: extremities normal, atraumatic, no cyanosis or edema Neurologic: Grossly normal  Diagnostic Studies & Laboratory data:     Recent Radiology Findings:   CARDIAC CATHETERIZATION Result Date: 05/29/2024 Images from the original result were not included.   Prox RCA-1 lesion is 20% stenosed.  Prox RCA-2 lesion is 70% stenosed.   Dist RCA-1 lesion is 95% stenosed.   Dist RCA-2 lesion is 85% stenosed.  RPDA lesion is 85% stenosed.   Ost LAD to Mid LAD lesion is 20% stenosed with 70% stenosed side branch in 1st Diag.   Mid LAD-1 lesion is 50% stenosed.  Mid LAD-2 lesion is 70% stenosed. Mid LAD-3 lesion is 50% stenosed with 40% stenosed side branch in 2nd Diag. (VFFR for both the  sidebranch and LAD was 0.77)   Mid Cx to Dist Cx lesion is 50% stenosed with 60% stenosed side branch in 2nd Mrg.   ------------------------------------   LV end diastolic pressure is normal.  There is no aortic valve stenosis. Dominance: Right Severe  Multivessel CAD: 20% proximal RCA followed by 70% eccentric lesion in the prox-mid RCA followed by the CULPRIT LESION which is a 95% focal lesion in the distal RCA and then an extensive 85% stenosis from distal RCA into PDA crossing the PAV Heavily calcified Left Main into the LAD and LCx: Minimal proximal disease with 70% ostial to proximal 1st Diag, 50 with focal 70% stenosis mid LAD prior to 2nd Diag then 40% at the bifurcation of D2 with 50% ostial 2nd Diag (the FFR of the 70% LAD lesion was 0.77 for both the LAD and 2nd Diag) 50 to 60% proximal LCx into the AV groove and OM1 branch. Borderline significant. Normal LVEDP RECOMMENDATIONS   Will place referral for CVTS consultation; continue to titrate GDMT for CAD   Will restart IV heparin 8 hours post TR band Removal   Recommend Aspirin  81mg  daily for moderate CAD. Alm Clay, MD  ECHOCARDIOGRAM COMPLETE Result Date: 05/28/2024    ECHOCARDIOGRAM REPORT   Patient Name:   IBRAHEEM VORIS Date of Exam: 05/28/2024 Medical Rec #:  [id number]      Height:       72.0 in Accession #:    [id number]     Weight:       169.6 lb Date of Birth:  1950/11/30       BSA:          1.986 m Patient Age:    73 years       BP:           148/86 mmHg Patient Gender: M              HR:           92 bpm. Exam Location:  Inpatient Procedure: 2D Echo, Cardiac Doppler and Color Doppler (Both Spectral and Color            Flow Doppler were utilized during procedure). Indications:    NSTEMI I21.4  History:        Patient has no prior history of Echocardiogram examinations.                 Risk Factors:Hypertension.  Sonographer:    Jayson Gaskins Referring Phys: [id number] CAROLE N HALL IMPRESSIONS  1. Left ventricular ejection fraction, by estimation, is 60 to 65%. The left ventricle has normal function. The left ventricle has no regional wall motion abnormalities. Left ventricular diastolic parameters are consistent with Grade I diastolic dysfunction (impaired relaxation).  2. Right  ventricular systolic function is normal. The right ventricular size is normal. Tricuspid regurgitation signal is inadequate for assessing PA pressure.  3. The mitral valve is normal in structure. Trivial mitral valve regurgitation. No evidence of mitral stenosis.  4. The aortic valve is tricuspid. There is mild calcification of the aortic valve. Aortic valve regurgitation is not visualized. Aortic valve sclerosis is present, with no evidence of aortic valve stenosis.  5. Aortic dilatation noted. There is mild dilatation of the aortic root, measuring 41 mm.  6. The inferior vena cava is normal in size with greater than 50% respiratory variability, suggesting right atrial pressure of 3 mmHg. FINDINGS  Left Ventricle: Left ventricular ejection fraction, by estimation, is 60 to 65%. The left  ventricle has normal function. The left ventricle has no regional wall motion abnormalities. The left ventricular internal cavity size was normal in size. There is  no left ventricular hypertrophy. Left ventricular diastolic parameters are consistent with Grade I diastolic dysfunction (impaired relaxation). Right Ventricle: The right ventricular size is normal. No increase in right ventricular wall thickness. Right ventricular systolic function is normal. Tricuspid regurgitation signal is inadequate for assessing PA pressure. Left Atrium: Left atrial size was normal in size. Right Atrium: Right atrial size was normal in size. Pericardium: There is no evidence of pericardial effusion. Mitral Valve: The mitral valve is normal in structure. Trivial mitral valve regurgitation. No evidence of mitral valve stenosis. Tricuspid Valve: The tricuspid valve is normal in structure. Tricuspid valve regurgitation is trivial. No evidence of tricuspid stenosis. Aortic Valve: The aortic valve is tricuspid. There is mild calcification of the aortic valve. Aortic valve regurgitation is not visualized. Aortic valve sclerosis is present, with no  evidence of aortic valve stenosis. Aortic valve mean gradient measures 5.0 mmHg. Aortic valve peak gradient measures 7.6 mmHg. Aortic valve area, by VTI measures 2.64 cm. Pulmonic Valve: The pulmonic valve was grossly normal. Pulmonic valve regurgitation is trivial. No evidence of pulmonic stenosis. Aorta: Aortic dilatation noted. There is mild dilatation of the aortic root, measuring 41 mm. Venous: The inferior vena cava was not well visualized. The inferior vena cava is normal in size with greater than 50% respiratory variability, suggesting right atrial pressure of 3 mmHg. IAS/Shunts: No atrial level shunt detected by color flow Doppler.  LEFT VENTRICLE PLAX 2D LVIDd:         4.50 cm   Diastology LVIDs:         2.90 cm   LV e' medial:    5.11 cm/s LV PW:         0.70 cm   LV E/e' medial:  9.8 LV IVS:        0.70 cm   LV e' lateral:   9.68 cm/s LVOT diam:     2.00 cm   LV E/e' lateral: 5.2 LV SV:         57 LV SV Index:   29 LVOT Area:     3.14 cm  RIGHT VENTRICLE RV S prime:     16.00 cm/s TAPSE (M-mode): 2.2 cm LEFT ATRIUM             Index        RIGHT ATRIUM           Index LA Vol (A2C):   37.2 ml 18.73 ml/m  RA Area:     11.60 cm LA Vol (A4C):   32.5 ml 16.36 ml/m  RA Volume:   23.60 ml  11.88 ml/m LA Biplane Vol: 35.1 ml 17.67 ml/m  AORTIC VALVE AV Area (Vmax):    2.71 cm AV Area (Vmean):   2.21 cm AV Area (VTI):     2.64 cm AV Vmax:           138.00 cm/s AV Vmean:          103.000 cm/s AV VTI:            0.218 m AV Peak Grad:      7.6 mmHg AV Mean Grad:      5.0 mmHg LVOT Vmax:         119.00 cm/s LVOT Vmean:        72.500 cm/s LVOT VTI:  0.183 m LVOT/AV VTI ratio: 0.84  AORTA Ao Root diam: 4.10 cm Ao Asc diam:  3.90 cm MITRAL VALVE MV Area (PHT): 3.12 cm    SHUNTS MV Decel Time: 243 msec    Systemic VTI:  0.18 m MV E velocity: 50.10 cm/s  Systemic Diam: 2.00 cm MV A velocity: 65.10 cm/s MV E/A ratio:  0.77 Soyla Merck MD Electronically signed by Soyla Merck MD Signature  Date/Time: 05/28/2024/3:07:07 PM    Final      I have independently reviewed the above radiologic studies and discussed with the patient   Recent Lab Findings: Lab Results  Component Value Date   WBC 5.8 05/29/2024   HGB 13.7 05/29/2024   HCT 41.9 05/29/2024   PLT 328 05/29/2024   GLUCOSE 100 (H) 05/29/2024   CHOL 247 (H) 05/29/2024   TRIG 133 05/29/2024   HDL 70 05/29/2024   LDLCALC 150 (H) 05/29/2024   ALT 18 05/26/2024   AST 25 05/26/2024   NA 138 05/29/2024   K 4.3 05/29/2024   CL 108 05/29/2024   CREATININE 2.03 (H) 05/29/2024   BUN 28 (H) 05/29/2024   CO2 22 05/29/2024   TSH 2.320 05/08/2024   HGBA1C 5.3 05/08/2024    Assessment / Plan:      CAD-persistent chest pain, mildly elevated Troponins, requesting CABG consultation CKD Stage 3- baseline is 1.95 HTN HLD  Patient is currently chest pain free.  He is very active, but has noticed over the past several weeks he has been more fatigued.  He has some baseline CKD creatinine at 1.95.  He appears to be a good candidate for coronary bypass grafting.  Dr. Kerrin to evaluate patient and determine timing of surgery.  I  spent 40 minutes counseling the patient face to face.   Rocky Shad, PA-C 05/29/2024 5:23 PM  Patient seen and examined, records and cath images reviewed.  &73 yo man with no prior cardiac history presents with unstable CP and ruled in for non-STEMI.  Cath revealed severe 3 vessel CAD.    CABG indicated for survival benefit and relief of symptoms.  I discussed the general nature of the procedure, including the need for general anesthesia, the incisions to be used, the use of cardiopulmonary bypass, and the use of temporary pacemaker wires and drainage tubes postoperatively with Mr and Mrs Caraher.  We discussed the expected hospital stay, overall recovery and short and long term outcomes. I informed them of the indications, risks, benefits and alternatives.   He understands the risks include, but  are not limited to death, stroke, MI, DVT/PE, bleeding, possible need for transfusion, infections, cardiac arrhythmias, as well as other organ system dysfunction including respiratory, renal, or GI complications.    Increased risks for renal dysfunction postoperatively.  Timing of surgery dependent on renal function and OR availability, but hopefully later this week.  Elspeth MOTE Kerrin, MD [facility] [phone]

## 2024-05-29 NOTE — Consult Note (Addendum)
 [address]       [place] [address]             [phone]        Victor Price [facility] Medical Record #[id number] Date of Birth: [birth year]  Referring: Anner Primary Care: [name], MD Primary Cardiologist:None  Chief Complaint:    Chief Complaint  Patient presents with   Chest Pain   History of Present Illness:      Victor Price is 73 yo male with known history of HTN, CKD Stage 3, Hypothyroidism/Graves Disease, HLD, and BPH.  The patient presented to an Urgent Care on 10/17 with complaints of chest pain that he described as someone having knees in his chest.  This had been occurring over several days.  He denied associated shortness of breath.  EKG was obtained and showed NSR that was identical to previous EKG on file.  He was discharged, but instructed to report to the ED should his pain worsen.  On 10/18 he presented to [facility] Emergency Department with complaints of intermittent chest pain.  He complained that episodes had occurred 6 times over the past 3-4 days and lasted about 20 min each time.  He again denied shortness of breath, N/V, and lightheadedness.  EKG remained normal with minimal elevation in Troponin level to 81.  Cardiology was consulted and recommended admission to medicine service.  Due to patient's baseline creatinine of 1.95 CTA was not recommended.  His troponin levels increased to over 200.  Due to this it was recommended he undergo catheterization which was performed today and showed CAD.  Cardiothoracic surgery consultation was requested.  The patient is currently chest pain free.  He notes that he has been experiencing episodes at rest.  He states he just lifted a bunch of pallets and had no issues.  He is a non-smoker.  He does not drink alcohol.  He denies family history of CAD.  He is retired, but is very active.  Current Activity/ Functional Status: Patient is independent with mobility/ambulation, transfers,  ADL's, IADL's.   Zubrod Score:  At the time of surgery this patient's most appropriate activity status/level should be described as: []     0    Normal activity, no symptoms [x]     1    Restricted in physical strenuous activity but ambulatory, able to do out light work []     2    Ambulatory and capable of self care, unable to do work activities, up and about                 more than 50%  Of the time                            []     3    Only limited self care, in bed greater than 50% of waking hours []     4    Completely disabled, no self care, confined to bed or chair []     5    Moribund  Past Medical History:  Diagnosis Date   BPH (benign prostatic hyperplasia)    Bronchiectasis with acute lower respiratory infection (HCC) 08/13/2019   Bronchitis    inhaler prn   Cancer (HCC)    skin cancer?   CKD (chronic kidney disease) stage 3, GFR 30-59 ml/min (HCC)    ED (erectile dysfunction)    GERD (gastroesophageal reflux disease)  HLD (hyperlipidemia)    Hypertension    Hyperthyroidism    Multinodular thyroid     Pneumonia    gets recurrent PNA in left lung, recent dx 08/13/19 left lower lobe   Seasonal allergies     Past Surgical History:  Procedure Laterality Date   BACK SURGERY  1980s   [facility]   COLONOSCOPY  2004, 2012   several - polyps   MULTIPLE TOOTH EXTRACTIONS     TOTAL HIP ARTHROPLASTY Left 08/24/2019   Procedure: LEFT TOTAL HIP ARTHROPLASTY-DIRECT ANTERIOR;  Surgeon: Barbarann Oneil BROCKS, MD;  Location: [facility] OR;  Service: Orthopedics;  Laterality: Left;   TOTAL HIP ARTHROPLASTY Right 08/26/2023   Procedure: RIGHT TOTAL HIP ARTHROPLASTY;  Surgeon: Barbarann Oneil BROCKS, MD;  Location: [facility] OR;  Service: Orthopedics;  Laterality: Right;  Needs RNFA    Social History   Tobacco Use  Smoking Status Never  Smokeless Tobacco Never    Social History   Substance and Sexual Activity  Alcohol Use No     Allergies  Allergen Reactions   Losartan  Other (See Comments)    Lightheadedness,  dizziness, syncope   Metoprolol  Other (See Comments)    dizziness   Nsaids Other (See Comments)    CKD 3     Current Facility-Administered Medications  Medication Dose Route Frequency Provider Last Rate Last Admin   0.9 %  sodium chloride  infusion   Intravenous Continuous Anner Alm ORN, MD 100 mL/hr at 05/29/24 0619 New Bag at 05/29/24 0619   0.9 %  sodium chloride  infusion   Intravenous Continuous Anner Alm ORN, MD 75 mL/hr at 05/29/24 1622 New Bag at 05/29/24 1622   0.9 %  sodium chloride  infusion  250 mL Intravenous PRN Anner Alm ORN, MD       acetaminophen  (TYLENOL ) tablet 650 mg  650 mg Oral Q4H PRN Anner Alm ORN, MD       aspirin  EC tablet 81 mg  81 mg Oral Daily Shona Laurence N, DO   81 mg at 05/28/24 1057   diltiazem (CARDIZEM CD) 24 hr capsule 180 mg  180 mg Oral Daily Salah, Husam M, MD   180 mg at 05/29/24 1036   fluticasone  (FLONASE ) 50 MCG/ACT nasal spray 2 spray  2 spray Each Nare Daily Lou Claretta HERO, MD   2 spray at 05/29/24 1045   gabapentin  (NEURONTIN ) capsule 600 mg  600 mg Oral BID Lou Claretta HERO, MD   600 mg at 05/29/24 1033   [START ON 05/30/2024] heparin ADULT infusion 100 units/mL (25000 units/250mL)  1,200 Units/hr Intravenous Continuous Williamson, Erin R, RPH       hydrALAZINE (APRESOLINE) injection 10 mg  10 mg Intravenous Q20 Min PRN Anner Alm ORN, MD       ipratropium (ATROVENT ) 0.06 % nasal spray 2 spray  2 spray Each Nare BID PRN Lou Claretta HERO, MD       methimazole  (TAPAZOLE ) tablet 10 mg  10 mg Oral BID Amponsah, Prosper M, MD   10 mg at 05/29/24 1044   morphine  (PF) 2 MG/ML injection 2 mg  2 mg Intravenous Q2H PRN Anner Alm ORN, MD       nitroGLYCERIN (NITROSTAT) SL tablet 0.4 mg  0.4 mg Sublingual Q5 min PRN Shona Laurence N, DO       ondansetron  (ZOFRAN ) injection 4 mg  4 mg Intravenous Q6H PRN Anner Alm ORN, MD       rosuvastatin  (CRESTOR ) tablet 20 mg  20 mg Oral Daily Lavona Agent,  MD   20 mg at 05/29/24 1033    sodium chloride  flush (NS) 0.9 % injection 3 mL  3 mL Intravenous Q12H Anner Alm ORN, MD       sodium chloride  flush (NS) 0.9 % injection 3 mL  3 mL Intravenous PRN Anner Alm ORN, MD       tamsulosin  (FLOMAX ) capsule 0.4 mg  0.4 mg Oral BID Amponsah, Prosper M, MD   0.4 mg at 05/29/24 1035   testosterone  (ANDROGEL ) 50 MG/5GM (1%) gel 5 g  5 g Transdermal Daily Amponsah, Prosper M, MD   5 g at 05/28/24 1059   traZODone  (DESYREL ) tablet 25-100 mg  25-100 mg Oral QHS PRN Amponsah, Prosper M, MD   50 mg at 05/28/24 2122   umeclidinium-vilanterol (ANORO ELLIPTA ) 62.5-25 MCG/ACT 1 puff  1 puff Inhalation Daily Lou Claretta HERO, MD   1 puff at 05/29/24 0830    Medications Prior to Admission  Medication Sig Dispense Refill Last Dose/Taking   bisoprolol -hydrochlorothiazide  (ZIAC ) 10-6.25 MG tablet TAKE 1 TABLET BY MOUTH EVERY DAY 90 tablet 0 05/25/2024   [facility] ALLERGY  RELIEF-D 10-240 MG 24 hr tablet TAKE 1 TABLET BY MOUTH EVERY DAY 30 tablet 10 05/25/2024   FARXIGA  10 MG TABS tablet TAKE 1 TABLET BY MOUTH EVERY DAY 90 tablet 2 05/25/2024   fluticasone  (FLONASE ) 50 MCG/ACT nasal spray Place 2 sprays into both nostrils daily. (Patient taking differently: Place 2 sprays into both nostrils daily as needed for allergies.) 16 g 6 Unknown   gabapentin  (NEURONTIN ) 600 MG tablet Take 1 tablet (600 mg total) by mouth 2 (two) times daily. (Patient taking differently: Take 300 mg by mouth 2 (two) times daily.) 180 tablet 1 05/25/2024   ipratropium (ATROVENT ) 0.03 % nasal spray Place 2 sprays into both nostrils 2 (two) times daily as needed for rhinitis. For runny nose (Patient taking differently: Place 2 sprays into both nostrils daily as needed for rhinitis (runny nose).) 30 mL 12 Unknown   methimazole  (TAPAZOLE ) 10 MG tablet TAKE 1 TABLET BY MOUTH TWICE A DAY (Patient taking differently: Take 10 mg by mouth daily.) 180 tablet 1 05/25/2024   sildenafil  (REVATIO ) 20 MG tablet TAKE 2 TO 5 TABLETS BY MOUTH DAILY AS  NEEDED (Patient taking differently: Take 20 mg by mouth daily.) 60 tablet 5 05/25/2024   tamsulosin  (FLOMAX ) 0.4 MG CAPS capsule TAKE 1 CAPSULE (0.4 MG TOTAL) BY MOUTH IN THE MORNING AND AT BEDTIME 180 capsule 3 05/25/2024   traZODone  (DESYREL ) 50 MG tablet TAKE 1/2 TO 2 TABLETS BY MOUTH AT BEDTIME AS NEEDED SLEEP (Patient taking differently: Take 100 mg by mouth at bedtime.) 180 tablet 3 05/25/2024   triamcinolone  cream (KENALOG ) 0.1 % Apply 1 Application topically 2 (two) times daily as needed (Rash).   Unknown   umeclidinium-vilanterol (ANORO ELLIPTA ) 62.5-25 MCG/ACT AEPB Inhale 1 puff into the lungs daily. 3 each 1 05/25/2024   Testosterone  12.5 MG/ACT (1%) GEL APPLY 1 PUMP TO AN INNER THIGH AND 1 PUMPS TO OPPOSITE INNER THIGH IN AM 75 g 3     Family History  Problem Relation Age of Onset   Dementia Mother    Healthy Father    Review of Systems:   ROS    Cardiac Review of Systems: Y or  [    ]= no  Chest Pain [  Y at rest, currently resolved  ]  Resting SOB [   ] Exertional SOB  [ N ]  Orthopnea [  ]  Pedal Edema [ N  ]    Palpitations [ N ] Syncope  [  ]   Presyncope [   ]  General Review of Systems: [Y] = yes [  ]=no Constitional: recent weight change [  ]; anorexia [  ]; fatigue [ Y ]; nausea Discordia.Diesel  ]; night sweats [  ]; fever [  ]; or chills [  ]                                                               Dental: Last Dentist visit:   Eye : blurred vision [  ]; diplopia [   ]; vision changes [  ];  Amaurosis fugax[  ]; Resp: cough [ N ];  wheezing[  ];  hemoptysis[  ]; shortness of breath[ N ]; paroxysmal nocturnal dyspnea[  ]; dyspnea on exertion[ N ]; or orthopnea[  ];  GI:  gallstones[  ], vomiting[  ];  dysphagia[  ]; melena[  ];  hematochezia [  ]; heartburn[  ];   Hx of  Colonoscopy[  ]; GU: kidney stones [  ]; hematuria[  ];   dysuria [  ];  nocturia[  ];  history of     obstruction [  ]; urinary frequency [  ]             Skin: rash, swelling[ N ];, hair loss[  ];   peripheral edema[N  ];  or itching[  ]; Musculosketetal: myalgias[  ];  joint swelling[  ];  joint erythema[  ];  joint pain[  ];  back pain[  ];  Heme/Lymph: bruising[  ];  bleeding[  ];  anemia[  ];  Neuro: TIA[  ];  headaches[  ];  stroke[ N ];  vertigo[  ];  seizures[  ];   paresthesias[  ];  difficulty walking[N  ];  Psych:depression[  ]; anxiety[  ];  Endocrine: diabetes[ N ];  thyroid  dysfunction[ Y ];  Physical Exam: BP (!) 156/82   Pulse 78   Temp 98 F (36.7 C) (Oral)   Resp 16   Ht 6' (1.829 m)   Wt 73.5 kg   SpO2 100%   BMI 21.97 kg/m   General appearance: alert, cooperative, and no distress Head: Normocephalic, without obvious abnormality, atraumatic Neck: no adenopathy, no carotid bruit, no JVD, supple, symmetrical, trachea midline, and thyroid  not enlarged, symmetric, no tenderness/mass/nodules Resp: clear to auscultation bilaterally Cardio: regular rate and rhythm GI: soft, non-tender; bowel sounds normal; no masses,  no organomegaly Extremities: extremities normal, atraumatic, no cyanosis or edema Neurologic: Grossly normal  Diagnostic Studies & Laboratory data:     Recent Radiology Findings:   CARDIAC CATHETERIZATION Result Date: 05/29/2024 Images from the original result were not included.   Prox RCA-1 lesion is 20% stenosed.  Prox RCA-2 lesion is 70% stenosed.   Dist RCA-1 lesion is 95% stenosed.   Dist RCA-2 lesion is 85% stenosed.  RPDA lesion is 85% stenosed.   Ost LAD to Mid LAD lesion is 20% stenosed with 70% stenosed side branch in 1st Diag.   Mid LAD-1 lesion is 50% stenosed.  Mid LAD-2 lesion is 70% stenosed. Mid LAD-3 lesion is 50% stenosed with 40% stenosed side branch in 2nd Diag. (VFFR for both the  sidebranch and LAD was 0.77)   Mid Cx to Dist Cx lesion is 50% stenosed with 60% stenosed side branch in 2nd Mrg.   ------------------------------------   LV end diastolic pressure is normal.  There is no aortic valve stenosis. Dominance: Right Severe  Multivessel CAD: 20% proximal RCA followed by 70% eccentric lesion in the prox-mid RCA followed by the CULPRIT LESION which is a 95% focal lesion in the distal RCA and then an extensive 85% stenosis from distal RCA into PDA crossing the PAV Heavily calcified Left Main into the LAD and LCx: Minimal proximal disease with 70% ostial to proximal 1st Diag, 50 with focal 70% stenosis mid LAD prior to 2nd Diag then 40% at the bifurcation of D2 with 50% ostial 2nd Diag (the FFR of the 70% LAD lesion was 0.77 for both the LAD and 2nd Diag) 50 to 60% proximal LCx into the AV groove and OM1 branch. Borderline significant. Normal LVEDP RECOMMENDATIONS   Will place referral for CVTS consultation; continue to titrate GDMT for CAD   Will restart IV heparin 8 hours post TR band Removal   Recommend Aspirin  81mg  daily for moderate CAD. Alm Clay, MD  ECHOCARDIOGRAM COMPLETE Result Date: 05/28/2024    ECHOCARDIOGRAM REPORT   Patient Name:   IBRAHEEM VORIS Date of Exam: 05/28/2024 Medical Rec #:  [id number]      Height:       72.0 in Accession #:    [id number]     Weight:       169.6 lb Date of Birth:  1950/11/30       BSA:          1.986 m Patient Age:    73 years       BP:           148/86 mmHg Patient Gender: M              HR:           92 bpm. Exam Location:  Inpatient Procedure: 2D Echo, Cardiac Doppler and Color Doppler (Both Spectral and Color            Flow Doppler were utilized during procedure). Indications:    NSTEMI I21.4  History:        Patient has no prior history of Echocardiogram examinations.                 Risk Factors:Hypertension.  Sonographer:    Jayson Gaskins Referring Phys: [id number] CAROLE N HALL IMPRESSIONS  1. Left ventricular ejection fraction, by estimation, is 60 to 65%. The left ventricle has normal function. The left ventricle has no regional wall motion abnormalities. Left ventricular diastolic parameters are consistent with Grade I diastolic dysfunction (impaired relaxation).  2. Right  ventricular systolic function is normal. The right ventricular size is normal. Tricuspid regurgitation signal is inadequate for assessing PA pressure.  3. The mitral valve is normal in structure. Trivial mitral valve regurgitation. No evidence of mitral stenosis.  4. The aortic valve is tricuspid. There is mild calcification of the aortic valve. Aortic valve regurgitation is not visualized. Aortic valve sclerosis is present, with no evidence of aortic valve stenosis.  5. Aortic dilatation noted. There is mild dilatation of the aortic root, measuring 41 mm.  6. The inferior vena cava is normal in size with greater than 50% respiratory variability, suggesting right atrial pressure of 3 mmHg. FINDINGS  Left Ventricle: Left ventricular ejection fraction, by estimation, is 60 to 65%. The left  ventricle has normal function. The left ventricle has no regional wall motion abnormalities. The left ventricular internal cavity size was normal in size. There is  no left ventricular hypertrophy. Left ventricular diastolic parameters are consistent with Grade I diastolic dysfunction (impaired relaxation). Right Ventricle: The right ventricular size is normal. No increase in right ventricular wall thickness. Right ventricular systolic function is normal. Tricuspid regurgitation signal is inadequate for assessing PA pressure. Left Atrium: Left atrial size was normal in size. Right Atrium: Right atrial size was normal in size. Pericardium: There is no evidence of pericardial effusion. Mitral Valve: The mitral valve is normal in structure. Trivial mitral valve regurgitation. No evidence of mitral valve stenosis. Tricuspid Valve: The tricuspid valve is normal in structure. Tricuspid valve regurgitation is trivial. No evidence of tricuspid stenosis. Aortic Valve: The aortic valve is tricuspid. There is mild calcification of the aortic valve. Aortic valve regurgitation is not visualized. Aortic valve sclerosis is present, with no  evidence of aortic valve stenosis. Aortic valve mean gradient measures 5.0 mmHg. Aortic valve peak gradient measures 7.6 mmHg. Aortic valve area, by VTI measures 2.64 cm. Pulmonic Valve: The pulmonic valve was grossly normal. Pulmonic valve regurgitation is trivial. No evidence of pulmonic stenosis. Aorta: Aortic dilatation noted. There is mild dilatation of the aortic root, measuring 41 mm. Venous: The inferior vena cava was not well visualized. The inferior vena cava is normal in size with greater than 50% respiratory variability, suggesting right atrial pressure of 3 mmHg. IAS/Shunts: No atrial level shunt detected by color flow Doppler.  LEFT VENTRICLE PLAX 2D LVIDd:         4.50 cm   Diastology LVIDs:         2.90 cm   LV e' medial:    5.11 cm/s LV PW:         0.70 cm   LV E/e' medial:  9.8 LV IVS:        0.70 cm   LV e' lateral:   9.68 cm/s LVOT diam:     2.00 cm   LV E/e' lateral: 5.2 LV SV:         57 LV SV Index:   29 LVOT Area:     3.14 cm  RIGHT VENTRICLE RV S prime:     16.00 cm/s TAPSE (M-mode): 2.2 cm LEFT ATRIUM             Index        RIGHT ATRIUM           Index LA Vol (A2C):   37.2 ml 18.73 ml/m  RA Area:     11.60 cm LA Vol (A4C):   32.5 ml 16.36 ml/m  RA Volume:   23.60 ml  11.88 ml/m LA Biplane Vol: 35.1 ml 17.67 ml/m  AORTIC VALVE AV Area (Vmax):    2.71 cm AV Area (Vmean):   2.21 cm AV Area (VTI):     2.64 cm AV Vmax:           138.00 cm/s AV Vmean:          103.000 cm/s AV VTI:            0.218 m AV Peak Grad:      7.6 mmHg AV Mean Grad:      5.0 mmHg LVOT Vmax:         119.00 cm/s LVOT Vmean:        72.500 cm/s LVOT VTI:  0.183 m LVOT/AV VTI ratio: 0.84  AORTA Ao Root diam: 4.10 cm Ao Asc diam:  3.90 cm MITRAL VALVE MV Area (PHT): 3.12 cm    SHUNTS MV Decel Time: 243 msec    Systemic VTI:  0.18 m MV E velocity: 50.10 cm/s  Systemic Diam: 2.00 cm MV A velocity: 65.10 cm/s MV E/A ratio:  0.77 Soyla Merck MD Electronically signed by Soyla Merck MD Signature  Date/Time: 05/28/2024/3:07:07 PM    Final      I have independently reviewed the above radiologic studies and discussed with the patient   Recent Lab Findings: Lab Results  Component Value Date   WBC 5.8 05/29/2024   HGB 13.7 05/29/2024   HCT 41.9 05/29/2024   PLT 328 05/29/2024   GLUCOSE 100 (H) 05/29/2024   CHOL 247 (H) 05/29/2024   TRIG 133 05/29/2024   HDL 70 05/29/2024   LDLCALC 150 (H) 05/29/2024   ALT 18 05/26/2024   AST 25 05/26/2024   NA 138 05/29/2024   K 4.3 05/29/2024   CL 108 05/29/2024   CREATININE 2.03 (H) 05/29/2024   BUN 28 (H) 05/29/2024   CO2 22 05/29/2024   TSH 2.320 05/08/2024   HGBA1C 5.3 05/08/2024    Assessment / Plan:      CAD-persistent chest pain, mildly elevated Troponins, requesting CABG consultation CKD Stage 3- baseline is 1.95 HTN HLD  Patient is currently chest pain free.  He is very active, but has noticed over the past several weeks he has been more fatigued.  He has some baseline CKD creatinine at 1.95.  He appears to be a good candidate for coronary bypass grafting.  Dr. Kerrin to evaluate patient and determine timing of surgery.  I  spent 40 minutes counseling the patient face to face.   Rocky Shad, PA-C 05/29/2024 5:23 PM  Patient seen and examined, records and cath images reviewed.  &73 yo man with no prior cardiac history presents with unstable CP and ruled in for non-STEMI.  Cath revealed severe 3 vessel CAD.    CABG indicated for survival benefit and relief of symptoms.  I discussed the general nature of the procedure, including the need for general anesthesia, the incisions to be used, the use of cardiopulmonary bypass, and the use of temporary pacemaker wires and drainage tubes postoperatively with Mr and Mrs Caraher.  We discussed the expected hospital stay, overall recovery and short and long term outcomes. I informed them of the indications, risks, benefits and alternatives.   He understands the risks include, but  are not limited to death, stroke, MI, DVT/PE, bleeding, possible need for transfusion, infections, cardiac arrhythmias, as well as other organ system dysfunction including respiratory, renal, or GI complications.    Increased risks for renal dysfunction postoperatively.  Timing of surgery dependent on renal function and OR availability, but hopefully later this week.  Elspeth MOTE Kerrin, MD [facility] [phone]

## 2024-05-29 NOTE — Progress Notes (Signed)
 PHARMACY - ANTICOAGULATION CONSULT NOTE  Pharmacy Consult for Heparin Indication: chest pain/ACS  Allergies  Allergen Reactions   Losartan  Other (See Comments)    Lightheadedness, dizziness, syncope   Metoprolol  Other (See Comments)    dizziness   Nsaids Other (See Comments)    CKD 3    Patient Measurements: Height: 6' (182.9 cm) Weight: 73.5 kg (162 lb) IBW/kg (Calculated) : 77.6 HEPARIN DW (KG): 76.9  Vital Signs: Temp: 98 F (36.7 C) (10/21 1556) Temp Source: Oral (10/21 1556) BP: 154/76 (10/21 1556) Pulse Rate: 0 (10/21 1556)  Labs: Recent Labs    05/27/24 1938 05/27/24 2119 05/28/24 0428 05/29/24 0403 05/29/24 1026  HGB  --   --  14.6 13.7  --   HCT  --   --  43.9 41.9  --   PLT  --   --  328 328  --   HEPARINUNFRC  --   --  0.33 0.27*  --   CREATININE  --   --  2.09* 2.31* 2.03*  TROPONINIHS 222* 229*  --   --   --     Estimated Creatinine Clearance: 34.7 mL/min (A) (by C-G formula based on SCr of 2.03 mg/dL (H)).   Medical History: Past Medical History:  Diagnosis Date   BPH (benign prostatic hyperplasia)    Bronchiectasis with acute lower respiratory infection (HCC) 08/13/2019   Bronchitis    inhaler prn   Cancer (HCC)    skin cancer?   CKD (chronic kidney disease) stage 3, GFR 30-59 ml/min (HCC)    ED (erectile dysfunction)    GERD (gastroesophageal reflux disease)    HLD (hyperlipidemia)    Hypertension    Hyperthyroidism    Multinodular thyroid     Pneumonia    gets recurrent PNA in left lung, recent dx 08/13/19 left lower lobe   Seasonal allergies    Assessment: 61 yoM admitted with CP. Pharmacy dosing IV heparin. No AC PTA.   Heparin level slightly below goal at 0.27, CBC stable.  PM update: Now s/p cath - to resume heparin 8hr after sheath removal (out at 15:54). TCTS consult pending.  Goal of Therapy:  Heparin level 0.3-0.7 units/ml Monitor platelets by anticoagulation protocol: Yes   Plan:  At midnight, resume heparin to 1200  units/h (no bolus) Check heparin level in 8hrs Daily heparin level and CBC  Rocky Slade, PharmD, BCPS Clinical Pharmacist Please check AMION for all [facility] Pharmacy numbers 05/29/2024 4:30 PM

## 2024-05-29 NOTE — Interval H&P Note (Signed)
 History and Physical Interval Note:  05/29/2024 3:14 PM  Victor Price  has presented today for surgery, with the diagnosis of nstemi.  The various methods of treatment have been discussed with the patient and family. After consideration of risks, benefits and other options for treatment, the patient has consented to  Procedure(s): LEFT HEART CATH AND CORONARY ANGIOGRAPHY (N/A)  PERCUTANEOUS CORONARY INTERVENTION   as a surgical intervention.  The patient's history has been reviewed, patient examined, no change in status, stable for surgery.  I have reviewed the patient's chart and labs.  Questions were answered to the patient's satisfaction.     Cath Lab Visit (complete for each Cath Lab visit)  Clinical Evaluation Leading to the Procedure:   ACS: Yes.    Non-ACS:    Anginal Classification: CCS IV  Anti-ischemic medical therapy: Minimal Therapy (1 class of medications)  Non-Invasive Test Results: No non-invasive testing performed  Prior CABG: No previous CABG     Alm Clay

## 2024-05-29 NOTE — Progress Notes (Signed)
 PROGRESS NOTE  Victor Price FMW:[id number] DOB: 15-May-1951 DOA: 05/26/2024 PCP: [name], MD   LOS: 1 day   Brief narrative:  Victor Price is a 73 y.o. male with medical history significant for CKD stage IIIb, HTN, BPH, erectile dysfunction, hypogonadism on testosterone  replacement, degenerative disc disease, bronchiectasis, and hyperthyroidism presented to hospital with chest pain for 4 to 5 days which was intermittent pressure-like without any shortness of breath.  In the ED, patient had slightly elevated blood pressure.  Creatinine on presentation was 1.9 troponin 81 followed by 80.  EKG shows sinus rhythm with no ischemic changes. CXR showed no active disease. Pt received aspirin  324 mg x 1.  Cardiology was consulted and patient was advised hospital for further evaluation and treatment.    Assessment/Plan: Principal Problem:   NSTEMI (non-ST elevated myocardial infarction) [facility]) Active Problems:   Essential hypertension   CKD stage 3b, GFR 30-44 ml/min (HCC)   Precordial chest pain   Hyperthyroidism   History of Graves' disease   Prediabetes  Chest pain. 5 days of intermittent chest pain.  EKG without ischemic changes.  Troponin peak at 81.  Cardiology was consulted and recommend stress echocardiogram initially but then patient had significant episode of chest pain during hospitalization and was started on heparin drip.  At this time cardiology has plan for cardiac catheterization for possible unstable angina.  Patient has elevated creatinine show Farxiga  and HCTZ will be on hold.  Patient has been started on IV fluid hydration precath.  2D echocardiogram with LV ejection fraction of 60 to 65% with grade 1 diastolic dysfunction and no regional wall motion abnormality.  Essential hypertension Continue to hold HCTZ.  Bisoprolol  on hold as per cardiology recommendation.  Patient currently on Cardizem.   CKD 3B Creatinine of 1.95 on presentation and now slightly trended  up to 2.0..  Continue to monitor closely.  Plan for cardiac catheterization.  Prediabetes Last hemoglobin A1c 5.3% 2 weeks ago.  Hold Farxiga .   Hyperthyroidism with Hx of Graves' disease - TSH within normal limits at 2.32,  2 weeks ago.  Continue methimazole     Hyperlipidemia Lipid panel 6 months back was with LDL of 167.  Will repeat lipid panel.   Bronchiectasis - Chronic and stable continue Anoro Ellipta , as needed DuoNebs.  No acute issues.   Hypogonadism On testosterone  2% gel as outpatient.   Insomnia - Continue as needed trazodone    BPH Continue Flomax .   DVT prophylaxis: SCDs Start: 05/26/24 1119   Disposition: Home likely in 1 to 2 days when okay with cardiology,  Status is: Observation The patient will require care spanning > 2 midnights and should be moved to inpatient because: Need for further cardiac  cardiac catheterization, renal dysfunction   Code Status:     Code Status: Full Code  Family Communication: Spoke with the patient's spouse at bedside on 05/27/2024  Consultants: Cardiology  Procedures: None yet  Anti-infectives:  None  Anti-infectives (From admission, onward)    None        Subjective: Today, patient was seen and examined at bedside.  Patient stated that he did have an episode of chest pain early this morning that lasted for 10 minutes or so.  Patient denies current chest pain shortness of breath or dyspnea.     Objective: Vitals:   05/29/24 0833 05/29/24 1036  BP:  (!) 140/91  Pulse:    Resp: 18   Temp: 97.7 F (36.5 C)   SpO2:  Intake/Output Summary (Last 24 hours) at 05/29/2024 1154 Last data filed at 05/29/2024 0543 Gross per 24 hour  Intake 1808.92 ml  Output --  Net 1808.92 ml   Filed Weights   05/26/24 0859 05/26/24 1831 05/28/24 1104  Weight: 81.2 kg 76.9 kg 73.5 kg   Body mass index is 21.97 kg/m.   Physical Exam: General:  Average built, not in obvious distress HENT:   No scleral pallor or  icterus noted. Oral mucosa is moist.  Chest:  Clear breath sounds.  No crackles or wheezes. CVS: S1 &S2 heard. No murmur.  Regular rate and rhythm. Abdomen: Soft, nontender, nondistended.  Bowel sounds are heard.   Extremities: No cyanosis, clubbing or edema.  Peripheral pulses are palpable. Psych: Alert, awake and oriented, normal mood CNS:  No cranial nerve deficits.  Power equal in all extremities.   Skin: Warm and dry.  No rashes noted.   Data Review: I have personally reviewed the following laboratory data and studies,  CBC: Recent Labs  Lab 05/26/24 0903 05/28/24 0428 05/29/24 0403  WBC 6.0 6.7 5.8  HGB 13.3 14.6 13.7  HCT 39.0 43.9 41.9  MCV 90.3 90.7 92.7  PLT 321 328 328   Basic Metabolic Panel: Recent Labs  Lab 05/24/24 1625 05/26/24 0903 05/28/24 0428 05/29/24 0403  NA 138 137 136 138  K 4.0 4.0 4.2 4.5  CL 97 100 102 105  CO2 23 25 22 24   GLUCOSE 106* 99 104* 120*  BUN 22 22 24* 33*  CREATININE 2.06* 1.95* 2.09* 2.31*  CALCIUM  10.2 9.9 9.3 8.8*  MG  --   --   --  2.3   Liver Function Tests: Recent Labs  Lab 05/26/24 0903  AST 25  ALT 18  ALKPHOS 75  BILITOT 0.4  PROT 7.2  ALBUMIN  4.3   Recent Labs  Lab 05/26/24 0903  LIPASE 30   No results for input(s): AMMONIA in the last 168 hours. Cardiac Enzymes: No results for input(s): CKTOTAL, CKMB, CKMBINDEX, TROPONINI in the last 168 hours. BNP (last 3 results) No results for input(s): BNP in the last 8760 hours.  ProBNP (last 3 results) No results for input(s): PROBNP in the last 8760 hours.  CBG: No results for input(s): GLUCAP in the last 168 hours. No results found for this or any previous visit (from the past 240 hours).   Studies: ECHOCARDIOGRAM COMPLETE Result Date: 05/28/2024    ECHOCARDIOGRAM REPORT   Patient Name:   Victor Price Date of Exam: 05/28/2024 Medical Rec #:  [id number]      Height:       72.0 in Accession #:    [id number]     Weight:       169.6 lb Date  of Birth:  26-Jan-1951       BSA:          1.986 m Patient Age:    73 years       BP:           148/86 mmHg Patient Gender: M              HR:           92 bpm. Exam Location:  Inpatient Procedure: 2D Echo, Cardiac Doppler and Color Doppler (Both Spectral and Color            Flow Doppler were utilized during procedure). Indications:    NSTEMI I21.4  History:        Patient has no  prior history of Echocardiogram examinations.                 Risk Factors:Hypertension.  Sonographer:    Jayson Gaskins Referring Phys: [id number] CAROLE N HALL IMPRESSIONS  1. Left ventricular ejection fraction, by estimation, is 60 to 65%. The left ventricle has normal function. The left ventricle has no regional wall motion abnormalities. Left ventricular diastolic parameters are consistent with Grade I diastolic dysfunction (impaired relaxation).  2. Right ventricular systolic function is normal. The right ventricular size is normal. Tricuspid regurgitation signal is inadequate for assessing PA pressure.  3. The mitral valve is normal in structure. Trivial mitral valve regurgitation. No evidence of mitral stenosis.  4. The aortic valve is tricuspid. There is mild calcification of the aortic valve. Aortic valve regurgitation is not visualized. Aortic valve sclerosis is present, with no evidence of aortic valve stenosis.  5. Aortic dilatation noted. There is mild dilatation of the aortic root, measuring 41 mm.  6. The inferior vena cava is normal in size with greater than 50% respiratory variability, suggesting right atrial pressure of 3 mmHg. FINDINGS  Left Ventricle: Left ventricular ejection fraction, by estimation, is 60 to 65%. The left ventricle has normal function. The left ventricle has no regional wall motion abnormalities. The left ventricular internal cavity size was normal in size. There is  no left ventricular hypertrophy. Left ventricular diastolic parameters are consistent with Grade I diastolic dysfunction (impaired  relaxation). Right Ventricle: The right ventricular size is normal. No increase in right ventricular wall thickness. Right ventricular systolic function is normal. Tricuspid regurgitation signal is inadequate for assessing PA pressure. Left Atrium: Left atrial size was normal in size. Right Atrium: Right atrial size was normal in size. Pericardium: There is no evidence of pericardial effusion. Mitral Valve: The mitral valve is normal in structure. Trivial mitral valve regurgitation. No evidence of mitral valve stenosis. Tricuspid Valve: The tricuspid valve is normal in structure. Tricuspid valve regurgitation is trivial. No evidence of tricuspid stenosis. Aortic Valve: The aortic valve is tricuspid. There is mild calcification of the aortic valve. Aortic valve regurgitation is not visualized. Aortic valve sclerosis is present, with no evidence of aortic valve stenosis. Aortic valve mean gradient measures 5.0 mmHg. Aortic valve peak gradient measures 7.6 mmHg. Aortic valve area, by VTI measures 2.64 cm. Pulmonic Valve: The pulmonic valve was grossly normal. Pulmonic valve regurgitation is trivial. No evidence of pulmonic stenosis. Aorta: Aortic dilatation noted. There is mild dilatation of the aortic root, measuring 41 mm. Venous: The inferior vena cava was not well visualized. The inferior vena cava is normal in size with greater than 50% respiratory variability, suggesting right atrial pressure of 3 mmHg. IAS/Shunts: No atrial level shunt detected by color flow Doppler.  LEFT VENTRICLE PLAX 2D LVIDd:         4.50 cm   Diastology LVIDs:         2.90 cm   LV e' medial:    5.11 cm/s LV PW:         0.70 cm   LV E/e' medial:  9.8 LV IVS:        0.70 cm   LV e' lateral:   9.68 cm/s LVOT diam:     2.00 cm   LV E/e' lateral: 5.2 LV SV:         57 LV SV Index:   29 LVOT Area:     3.14 cm  RIGHT VENTRICLE RV S prime:  16.00 cm/s TAPSE (M-mode): 2.2 cm LEFT ATRIUM             Index        RIGHT ATRIUM           Index LA  Vol (A2C):   37.2 ml 18.73 ml/m  RA Area:     11.60 cm LA Vol (A4C):   32.5 ml 16.36 ml/m  RA Volume:   23.60 ml  11.88 ml/m LA Biplane Vol: 35.1 ml 17.67 ml/m  AORTIC VALVE AV Area (Vmax):    2.71 cm AV Area (Vmean):   2.21 cm AV Area (VTI):     2.64 cm AV Vmax:           138.00 cm/s AV Vmean:          103.000 cm/s AV VTI:            0.218 m AV Peak Grad:      7.6 mmHg AV Mean Grad:      5.0 mmHg LVOT Vmax:         119.00 cm/s LVOT Vmean:        72.500 cm/s LVOT VTI:          0.183 m LVOT/AV VTI ratio: 0.84  AORTA Ao Root diam: 4.10 cm Ao Asc diam:  3.90 cm MITRAL VALVE MV Area (PHT): 3.12 cm    SHUNTS MV Decel Time: 243 msec    Systemic VTI:  0.18 m MV E velocity: 50.10 cm/s  Systemic Diam: 2.00 cm MV A velocity: 65.10 cm/s MV E/A ratio:  0.77 Soyla Merck MD Electronically signed by Soyla Merck MD Signature Date/Time: 05/28/2024/3:07:07 PM    Final       Vernal Alstrom, MD  [facility] 05/29/2024  If 7PM-7AM, please contact night-coverage

## 2024-05-29 NOTE — Progress Notes (Addendum)
 Progress Note  Patient Name: Victor Price Date of Encounter: 05/29/2024 [facility] Cardiologist: None   Interval Summary   Had a brief episode of chest pain at 4:30 am. Denies any ongoing chest pain.  Vital Signs Vitals:   05/28/24 2030 05/28/24 2035 05/29/24 0004 05/29/24 0445  BP: 138/81 138/81 (!) 147/82 127/72  Pulse: 88 90 88 73  Resp: 18  18 17   Temp: 98.7 F (37.1 C)  98.6 F (37 C) 98.6 F (37 C)  TempSrc: Oral  Oral Oral  SpO2: 98% 98% 98% 97%  Weight:      Height:        Intake/Output Summary (Last 24 hours) at 05/29/2024 0800 Last data filed at 05/29/2024 0543 Gross per 24 hour  Intake 1808.92 ml  Output --  Net 1808.92 ml      05/28/2024   11:04 AM 05/26/2024    6:31 PM 05/26/2024    8:59 AM  Last 3 Weights  Weight (lbs) 162 lb 169 lb 9.6 oz 179 lb  Weight (kg) 73.483 kg 76.93 kg 81.194 kg      Telemetry/ECG  Normal sinus rhythm in the 70's to  - Personally Reviewed  Physical Exam  GEN: No acute distress.  Alert and orientated. Neck: No JVD Cardiac: RRR, no murmurs, rubs, or gallops.  Respiratory: Clear to auscultation bilaterally. GI: Soft, nontender, non-distended  MS: No edema  Assessment & Plan   NSTEMI Coronary calcifications Hyperlipidemia Having recurrent chest pain at rest. Extensive coronary calcifications seen on chest CT on 02/2022. Elevated high sensitivity Troponin T 81 > 80, Troponin I 222 > 229. LDL elevated at 150.  No acute ischemic changes on EKG today. TTE on 05/28/24 showed a normal LVEF of 60-65%, No RWMA, G1DD, normal RV function, and mild aortic root dilation (41mm). Was consented for a LHC that was planned for today. hydrochlorothiazide  and Farxiga  were held. Continue Aspirin  81mg  daily Continue Crestor  20mg  daily Continue IV heparin May consider starting Imdur.    AKI on CKD stage IIIB Creatinine up to 2.31 on this am at 4:03. This is up from 1.95 on 05/26/24 and 2.09 yesterday. Has been on IV  fluids over night.   HTN BP's have been labile.  Most recent BP 127/72 Continue Cardizem 180mg  daily.   Hyperthyroidism On methimazole  Management per primary  Otherwise management per primary.  For questions or updates, please contact [facility] Please consult [email or web address] for contact info under      Signed, Morse Clause, PA-C   History and all data above reviewed.  I personally took the history today, performed the physical exam and formulated the assessment and plan.  I reviewed all relevant tests and studies. Patient examined.  I agree with the findings as above.   He has had some chest pain briefly.  Currently pain freeThe patient exam reveals COR:RRR  ,  Lungs: Clear  ,  Abd: Positive bowel sounds, no rebound no guarding, Ext No edema   .  All available labs, radiology testing, previous records reviewed. Agree with documented assessment and plan.   Unstable angina:  NQWMI.  Needs cardiac cath with his unstable symptoms.  This might be only diagnostic with limited dye given his kidney function.  He and I have discussed that.  Awaiting repeat creat.  CKD:  I reviewed outside nephrology notes. He has had a work up and it appears that this is related to uncontrolled HTN.  Follow post cath.  Murmur:  He has mild aortic valve calcium .  He also has mildly dilated Ao.  Discussed with him and we will follow.    [name]  11:41 AM  05/29/2024

## 2024-05-29 NOTE — TOC CM/SW Note (Signed)
 Transition of Care [facility]) - Inpatient Brief Assessment   Patient Details  Name: Victor Price MRN: [id number] Date of Birth: 1951/04/13  Transition of Care [facility] CM/SW Contact:    Sudie Erminio Deems, RN Phone Number: 05/29/2024, 1:40 PM   Clinical Narrative: Patient presented for chest pain-plans for LHC today. PTA patient was independent from home with spouse. Spouse is at the bedside during the visit. Patient has insurance and PCP. No home needs identified at this time. ICM will continue to follow for disposition needs.     Transition of Care Asessment: Insurance and Status: Insurance coverage has been reviewed Patient has primary care physician: Yes Home environment has been reviewed: reviewed Prior level of function:: indepenent Prior/Current Home Services: No current home services Social Drivers of Health Review: SDOH reviewed no interventions necessary Readmission risk has been reviewed: Yes Transition of care needs: no transition of care needs at this time

## 2024-05-29 NOTE — Progress Notes (Signed)
 Pt having 10/10 CP, midsternal, sharp, sudden spasm substernal with radiation to bilat arms through back/ shoulders.  Pt refused NTG or any pain meds, already on Hep gtt, taking ASA. Pt has had several episodes this week similarly, This one not as bad though Stating he knows it will go away soon, just wanted to make someone aware. Vital signs stable- MD Shona, C notified. Got EKG (normal),  pain subsided in 7 minutes Notified MD, Charge, Cath lab. PT resting

## 2024-05-29 NOTE — H&P (View-Only) (Signed)
 Progress Note  Patient Name: Victor Price Date of Encounter: 05/29/2024 [facility] Cardiologist: None   Interval Summary   Had a brief episode of chest pain at 4:30 am. Denies any ongoing chest pain.  Vital Signs Vitals:   05/28/24 2030 05/28/24 2035 05/29/24 0004 05/29/24 0445  BP: 138/81 138/81 (!) 147/82 127/72  Pulse: 88 90 88 73  Resp: 18  18 17   Temp: 98.7 F (37.1 C)  98.6 F (37 C) 98.6 F (37 C)  TempSrc: Oral  Oral Oral  SpO2: 98% 98% 98% 97%  Weight:      Height:        Intake/Output Summary (Last 24 hours) at 05/29/2024 0800 Last data filed at 05/29/2024 0543 Gross per 24 hour  Intake 1808.92 ml  Output --  Net 1808.92 ml      05/28/2024   11:04 AM 05/26/2024    6:31 PM 05/26/2024    8:59 AM  Last 3 Weights  Weight (lbs) 162 lb 169 lb 9.6 oz 179 lb  Weight (kg) 73.483 kg 76.93 kg 81.194 kg      Telemetry/ECG  Normal sinus rhythm in the 70's to  - Personally Reviewed  Physical Exam  GEN: No acute distress.  Alert and orientated. Neck: No JVD Cardiac: RRR, no murmurs, rubs, or gallops.  Respiratory: Clear to auscultation bilaterally. GI: Soft, nontender, non-distended  MS: No edema  Assessment & Plan   NSTEMI Coronary calcifications Hyperlipidemia Having recurrent chest pain at rest. Extensive coronary calcifications seen on chest CT on 02/2022. Elevated high sensitivity Troponin T 81 > 80, Troponin I 222 > 229. LDL elevated at 150.  No acute ischemic changes on EKG today. TTE on 05/28/24 showed a normal LVEF of 60-65%, No RWMA, G1DD, normal RV function, and mild aortic root dilation (41mm). Was consented for a LHC that was planned for today. hydrochlorothiazide  and Farxiga  were held. Continue Aspirin  81mg  daily Continue Crestor  20mg  daily Continue IV heparin May consider starting Imdur.    AKI on CKD stage IIIB Creatinine up to 2.31 on this am at 4:03. This is up from 1.95 on 05/26/24 and 2.09 yesterday. Has been on IV  fluids over night.   HTN BP's have been labile.  Most recent BP 127/72 Continue Cardizem 180mg  daily.   Hyperthyroidism On methimazole  Management per primary  Otherwise management per primary.  For questions or updates, please contact [facility] Please consult [email or web address] for contact info under      Signed, Morse Clause, PA-C   History and all data above reviewed.  I personally took the history today, performed the physical exam and formulated the assessment and plan.  I reviewed all relevant tests and studies. Patient examined.  I agree with the findings as above.   He has had some chest pain briefly.  Currently pain freeThe patient exam reveals COR:RRR  ,  Lungs: Clear  ,  Abd: Positive bowel sounds, no rebound no guarding, Ext No edema   .  All available labs, radiology testing, previous records reviewed. Agree with documented assessment and plan.   Unstable angina:  NQWMI.  Needs cardiac cath with his unstable symptoms.  This might be only diagnostic with limited dye given his kidney function.  He and I have discussed that.  Awaiting repeat creat.  CKD:  I reviewed outside nephrology notes. He has had a work up and it appears that this is related to uncontrolled HTN.  Follow post cath.  Murmur:  He has mild aortic valve calcium .  He also has mildly dilated Ao.  Discussed with him and we will follow.    Victor Price  11:41 AM  05/29/2024

## 2024-05-30 ENCOUNTER — Encounter (HOSPITAL_COMMUNITY): Payer: Self-pay | Admitting: Cardiology

## 2024-05-30 ENCOUNTER — Observation Stay (HOSPITAL_COMMUNITY)

## 2024-05-30 DIAGNOSIS — E875 Hyperkalemia: Secondary | ICD-10-CM | POA: Diagnosis not present

## 2024-05-30 DIAGNOSIS — I129 Hypertensive chronic kidney disease with stage 1 through stage 4 chronic kidney disease, or unspecified chronic kidney disease: Secondary | ICD-10-CM | POA: Diagnosis not present

## 2024-05-30 DIAGNOSIS — J479 Bronchiectasis, uncomplicated: Secondary | ICD-10-CM | POA: Diagnosis not present

## 2024-05-30 DIAGNOSIS — E876 Hypokalemia: Secondary | ICD-10-CM | POA: Diagnosis not present

## 2024-05-30 DIAGNOSIS — I088 Other rheumatic multiple valve diseases: Secondary | ICD-10-CM | POA: Diagnosis not present

## 2024-05-30 DIAGNOSIS — R072 Precordial pain: Secondary | ICD-10-CM | POA: Diagnosis not present

## 2024-05-30 DIAGNOSIS — G4700 Insomnia, unspecified: Secondary | ICD-10-CM | POA: Diagnosis not present

## 2024-05-30 DIAGNOSIS — Z888 Allergy status to other drugs, medicaments and biological substances status: Secondary | ICD-10-CM | POA: Diagnosis not present

## 2024-05-30 DIAGNOSIS — Z951 Presence of aortocoronary bypass graft: Secondary | ICD-10-CM | POA: Diagnosis not present

## 2024-05-30 DIAGNOSIS — H539 Unspecified visual disturbance: Secondary | ICD-10-CM | POA: Diagnosis not present

## 2024-05-30 DIAGNOSIS — K219 Gastro-esophageal reflux disease without esophagitis: Secondary | ICD-10-CM | POA: Diagnosis not present

## 2024-05-30 DIAGNOSIS — J811 Chronic pulmonary edema: Secondary | ICD-10-CM | POA: Diagnosis not present

## 2024-05-30 DIAGNOSIS — R918 Other nonspecific abnormal finding of lung field: Secondary | ICD-10-CM | POA: Diagnosis not present

## 2024-05-30 DIAGNOSIS — J9811 Atelectasis: Secondary | ICD-10-CM | POA: Diagnosis not present

## 2024-05-30 DIAGNOSIS — I2510 Atherosclerotic heart disease of native coronary artery without angina pectoris: Secondary | ICD-10-CM | POA: Diagnosis not present

## 2024-05-30 DIAGNOSIS — N179 Acute kidney failure, unspecified: Secondary | ICD-10-CM | POA: Diagnosis not present

## 2024-05-30 DIAGNOSIS — Z48812 Encounter for surgical aftercare following surgery on the circulatory system: Secondary | ICD-10-CM | POA: Diagnosis not present

## 2024-05-30 DIAGNOSIS — I10 Essential (primary) hypertension: Secondary | ICD-10-CM | POA: Diagnosis not present

## 2024-05-30 DIAGNOSIS — Z96643 Presence of artificial hip joint, bilateral: Secondary | ICD-10-CM | POA: Diagnosis not present

## 2024-05-30 DIAGNOSIS — J90 Pleural effusion, not elsewhere classified: Secondary | ICD-10-CM | POA: Diagnosis not present

## 2024-05-30 DIAGNOSIS — Z01810 Encounter for preprocedural cardiovascular examination: Secondary | ICD-10-CM | POA: Diagnosis not present

## 2024-05-30 DIAGNOSIS — N1832 Chronic kidney disease, stage 3b: Secondary | ICD-10-CM | POA: Diagnosis not present

## 2024-05-30 DIAGNOSIS — N400 Enlarged prostate without lower urinary tract symptoms: Secondary | ICD-10-CM | POA: Diagnosis not present

## 2024-05-30 DIAGNOSIS — J40 Bronchitis, not specified as acute or chronic: Secondary | ICD-10-CM | POA: Diagnosis not present

## 2024-05-30 DIAGNOSIS — E871 Hypo-osmolality and hyponatremia: Secondary | ICD-10-CM | POA: Diagnosis not present

## 2024-05-30 DIAGNOSIS — E0590 Thyrotoxicosis, unspecified without thyrotoxic crisis or storm: Secondary | ICD-10-CM | POA: Diagnosis not present

## 2024-05-30 DIAGNOSIS — R930 Abnormal findings on diagnostic imaging of skull and head, not elsewhere classified: Secondary | ICD-10-CM | POA: Diagnosis not present

## 2024-05-30 DIAGNOSIS — I5031 Acute diastolic (congestive) heart failure: Secondary | ICD-10-CM | POA: Diagnosis not present

## 2024-05-30 DIAGNOSIS — I130 Hypertensive heart and chronic kidney disease with heart failure and stage 1 through stage 4 chronic kidney disease, or unspecified chronic kidney disease: Secondary | ICD-10-CM | POA: Diagnosis not present

## 2024-05-30 DIAGNOSIS — Z8639 Personal history of other endocrine, nutritional and metabolic disease: Secondary | ICD-10-CM | POA: Diagnosis not present

## 2024-05-30 DIAGNOSIS — Z79899 Other long term (current) drug therapy: Secondary | ICD-10-CM | POA: Diagnosis not present

## 2024-05-30 DIAGNOSIS — Z452 Encounter for adjustment and management of vascular access device: Secondary | ICD-10-CM | POA: Diagnosis not present

## 2024-05-30 DIAGNOSIS — J939 Pneumothorax, unspecified: Secondary | ICD-10-CM | POA: Diagnosis not present

## 2024-05-30 DIAGNOSIS — I214 Non-ST elevation (NSTEMI) myocardial infarction: Secondary | ICD-10-CM | POA: Diagnosis not present

## 2024-05-30 DIAGNOSIS — I4891 Unspecified atrial fibrillation: Secondary | ICD-10-CM | POA: Diagnosis not present

## 2024-05-30 DIAGNOSIS — E785 Hyperlipidemia, unspecified: Secondary | ICD-10-CM | POA: Diagnosis not present

## 2024-05-30 DIAGNOSIS — E039 Hypothyroidism, unspecified: Secondary | ICD-10-CM | POA: Diagnosis not present

## 2024-05-30 DIAGNOSIS — Z4682 Encounter for fitting and adjustment of non-vascular catheter: Secondary | ICD-10-CM | POA: Diagnosis not present

## 2024-05-30 DIAGNOSIS — D62 Acute posthemorrhagic anemia: Secondary | ICD-10-CM | POA: Diagnosis not present

## 2024-05-30 DIAGNOSIS — R7303 Prediabetes: Secondary | ICD-10-CM | POA: Diagnosis not present

## 2024-05-30 DIAGNOSIS — I319 Disease of pericardium, unspecified: Secondary | ICD-10-CM | POA: Diagnosis not present

## 2024-05-30 LAB — URINALYSIS, ROUTINE W REFLEX MICROSCOPIC
Bacteria, UA: NONE SEEN
Bilirubin Urine: NEGATIVE
Glucose, UA: >=500 mg/dL — AB
Hgb urine dipstick: NEGATIVE
Ketones, ur: NEGATIVE mg/dL
Leukocytes,Ua: NEGATIVE
Nitrite: NEGATIVE
Protein, ur: 100 mg/dL — AB
Specific Gravity, Urine: 1.018 (ref 1.005–1.030)
pH: 5 (ref 5.0–8.0)

## 2024-05-30 LAB — BLOOD GAS, ARTERIAL
Acid-Base Excess: 3.2 mmol/L — ABNORMAL HIGH (ref 0.0–2.0)
Bicarbonate: 27.9 mmol/L (ref 20.0–28.0)
O2 Saturation: 98.3 %
Patient temperature: 36.7
pCO2 arterial: 41 mmHg (ref 32–48)
pH, Arterial: 7.43 (ref 7.35–7.45)
pO2, Arterial: 88 mmHg (ref 83–108)

## 2024-05-30 LAB — HEMOGLOBIN A1C
Hgb A1c MFr Bld: 5.5 % (ref 4.8–5.6)
Mean Plasma Glucose: 111.15 mg/dL

## 2024-05-30 LAB — BASIC METABOLIC PANEL WITH GFR
Anion gap: 7 (ref 5–15)
BUN: 21 mg/dL (ref 8–23)
CO2: 21 mmol/L — ABNORMAL LOW (ref 22–32)
Calcium: 8.4 mg/dL — ABNORMAL LOW (ref 8.9–10.3)
Chloride: 110 mmol/L (ref 98–111)
Creatinine, Ser: 1.8 mg/dL — ABNORMAL HIGH (ref 0.61–1.24)
GFR, Estimated: 40 mL/min — ABNORMAL LOW (ref >60)
Glucose, Bld: 96 mg/dL (ref 70–99)
Potassium: 4.7 mmol/L (ref 3.5–5.1)
Sodium: 138 mmol/L (ref 135–145)

## 2024-05-30 LAB — APTT: aPTT: 69 s — ABNORMAL HIGH (ref 24–36)

## 2024-05-30 LAB — SURGICAL PCR SCREEN
MRSA, PCR: NEGATIVE
Staphylococcus aureus: NEGATIVE

## 2024-05-30 LAB — CBC
HCT: 38.4 % — ABNORMAL LOW (ref 39.0–52.0)
Hemoglobin: 12.6 g/dL — ABNORMAL LOW (ref 13.0–17.0)
MCH: 30.3 pg (ref 26.0–34.0)
MCHC: 32.8 g/dL (ref 30.0–36.0)
MCV: 92.3 fL (ref 80.0–100.0)
Platelets: 310 K/uL (ref 150–400)
RBC: 4.16 MIL/uL — ABNORMAL LOW (ref 4.22–5.81)
RDW: 13.6 % (ref 11.5–15.5)
WBC: 5.8 K/uL (ref 4.0–10.5)
nRBC: 0 % (ref 0.0–0.2)

## 2024-05-30 LAB — PROTIME-INR
INR: 0.9 (ref 0.8–1.2)
Prothrombin Time: 12.8 s (ref 11.4–15.2)

## 2024-05-30 LAB — RESP PANEL BY RT-PCR (RSV, FLU A&B, COVID)  RVPGX2
Influenza A by PCR: NEGATIVE
Influenza B by PCR: NEGATIVE
Resp Syncytial Virus by PCR: NEGATIVE
SARS Coronavirus 2 by RT PCR: NEGATIVE

## 2024-05-30 LAB — MAGNESIUM: Magnesium: 2.1 mg/dL (ref 1.7–2.4)

## 2024-05-30 LAB — HEPARIN LEVEL (UNFRACTIONATED): Heparin Unfractionated: 0.26 [IU]/mL — ABNORMAL LOW (ref 0.30–0.70)

## 2024-05-30 LAB — PREPARE RBC (CROSSMATCH)

## 2024-05-30 MED ORDER — ALPRAZOLAM 0.25 MG PO TABS: 0.2500 mg | ORAL_TABLET | ORAL | Status: DC | PRN

## 2024-05-30 MED ORDER — VANCOMYCIN HCL 1250 MG/250ML IV SOLN
1250.0000 mg | INTRAVENOUS | Status: AC
  Administered 2024-05-31: 1250 mg via INTRAVENOUS
  Filled 2024-05-30: qty 250

## 2024-05-30 MED ORDER — DEXMEDETOMIDINE HCL IN NACL 400 MCG/100ML IV SOLN
0.1000 ug/kg/h | INTRAVENOUS | Status: AC
  Administered 2024-05-31: .4 ug/kg/h via INTRAVENOUS
  Filled 2024-05-30: qty 100

## 2024-05-30 MED ORDER — MAGNESIUM SULFATE 50 % IJ SOLN
40.0000 meq | INTRAMUSCULAR | Status: DC
  Filled 2024-05-30: qty 9.85

## 2024-05-30 MED ORDER — TEMAZEPAM 7.5 MG PO CAPS: 15.0000 mg | ORAL_CAPSULE | Freq: Once | ORAL | Status: DC | PRN

## 2024-05-30 MED ORDER — PHENYLEPHRINE HCL-NACL 20-0.9 MG/250ML-% IV SOLN
30.0000 ug/min | INTRAVENOUS | Status: DC
  Filled 2024-05-30: qty 250

## 2024-05-30 MED ORDER — MILRINONE LACTATE IN DEXTROSE 20-5 MG/100ML-% IV SOLN
0.3000 ug/kg/min | INTRAVENOUS | Status: DC
  Filled 2024-05-30: qty 100

## 2024-05-30 MED ORDER — NOREPINEPHRINE 4 MG/250ML-% IV SOLN
0.0000 ug/min | INTRAVENOUS | Status: DC
Start: 2024-05-31 — End: 2024-06-01
  Filled 2024-05-30: qty 250

## 2024-05-30 MED ORDER — CHLORHEXIDINE GLUCONATE 0.12 % MT SOLN
15.0000 mL | Freq: Once | OROMUCOSAL | Status: AC
  Administered 2024-05-31: 15 mL via OROMUCOSAL
  Filled 2024-05-30: qty 15

## 2024-05-30 MED ORDER — CEFAZOLIN SODIUM-DEXTROSE 2-4 GM/100ML-% IV SOLN
2.0000 g | INTRAVENOUS | Status: AC
  Administered 2024-05-31: 2 g via INTRAVENOUS
  Filled 2024-05-30: qty 100

## 2024-05-30 MED ORDER — CEFAZOLIN SODIUM-DEXTROSE 2-4 GM/100ML-% IV SOLN
2.0000 g | INTRAVENOUS | Status: DC
  Filled 2024-05-30: qty 100

## 2024-05-30 MED ORDER — TRANEXAMIC ACID (OHS) PUMP PRIME SOLUTION
2.0000 mg/kg | INTRAVENOUS | Status: DC
  Filled 2024-05-30: qty 1.47

## 2024-05-30 MED ORDER — CHLORHEXIDINE GLUCONATE CLOTH 2 % EX PADS
6.0000 | MEDICATED_PAD | Freq: Once | CUTANEOUS | Status: AC
Start: 2024-05-30 — End: 2024-05-30
  Administered 2024-05-30: 6 via TOPICAL

## 2024-05-30 MED ORDER — BISACODYL 5 MG PO TBEC
5.0000 mg | DELAYED_RELEASE_TABLET | Freq: Once | ORAL | Status: AC
  Administered 2024-05-30: 5 mg via ORAL
  Filled 2024-05-30: qty 1

## 2024-05-30 MED ORDER — INSULIN REGULAR(HUMAN) IN NACL 100-0.9 UT/100ML-% IV SOLN
INTRAVENOUS | Status: AC
  Administered 2024-05-31: .8 [IU]/h via INTRAVENOUS
  Filled 2024-05-30: qty 100

## 2024-05-30 MED ORDER — CHLORHEXIDINE GLUCONATE CLOTH 2 % EX PADS
6.0000 | MEDICATED_PAD | Freq: Once | CUTANEOUS | Status: AC
  Administered 2024-05-31: 6 via TOPICAL

## 2024-05-30 MED ORDER — HEPARIN 30,000 UNITS/1000 ML (OHS) CELLSAVER SOLUTION
Status: DC
  Filled 2024-05-30: qty 1000

## 2024-05-30 MED ORDER — ROSUVASTATIN CALCIUM 20 MG PO TABS
40.0000 mg | ORAL_TABLET | Freq: Every day | ORAL | Status: DC
  Administered 2024-05-30 – 2024-06-05 (×6): 40 mg via ORAL
  Filled 2024-05-30 (×6): qty 2

## 2024-05-30 MED ORDER — TRANEXAMIC ACID 1000 MG/10ML IV SOLN
1.5000 mg/kg/h | INTRAVENOUS | Status: AC
  Administered 2024-05-31: 1.5 mg/kg/h via INTRAVENOUS
  Filled 2024-05-30: qty 25

## 2024-05-30 MED ORDER — PLASMA-LYTE A IV SOLN
INTRAVENOUS | Status: DC
  Filled 2024-05-30: qty 2.5

## 2024-05-30 MED ORDER — POTASSIUM CHLORIDE 2 MEQ/ML IV SOLN
80.0000 meq | INTRAVENOUS | Status: DC
  Filled 2024-05-30: qty 40

## 2024-05-30 MED ORDER — EPINEPHRINE HCL 5 MG/250ML IV SOLN IN NS
0.0000 ug/min | INTRAVENOUS | Status: DC
  Filled 2024-05-30: qty 250

## 2024-05-30 MED ORDER — TRANEXAMIC ACID (OHS) BOLUS VIA INFUSION
15.0000 mg/kg | INTRAVENOUS | Status: AC
  Administered 2024-05-31: 1102.5 mg via INTRAVENOUS
  Filled 2024-05-30: qty 1103

## 2024-05-30 MED ORDER — METOPROLOL TARTRATE 12.5 MG HALF TABLET
12.5000 mg | ORAL_TABLET | Freq: Once | ORAL | Status: AC
  Administered 2024-05-31: 12.5 mg via ORAL
  Filled 2024-05-30: qty 1

## 2024-05-30 MED ORDER — NITROGLYCERIN IN D5W 200-5 MCG/ML-% IV SOLN
2.0000 ug/min | INTRAVENOUS | Status: DC
Start: 2024-05-31 — End: 2024-06-01
  Filled 2024-05-30: qty 250

## 2024-05-30 NOTE — Progress Notes (Addendum)
 Progress Note  Patient Name: Victor Price Date of Encounter: 05/30/2024 [facility] Cardiologist: None   Interval Summary   Denies any chest pain or shortness of breath.  Has been able to get up and walk.   Vital Signs Vitals:   05/29/24 1756 05/29/24 2038 05/30/24 0000 05/30/24 0442  BP: (!) 172/83 (!) 156/76 135/78 125/69  Pulse: 76 84 84 89  Resp:  16 16 15   Temp:  98.5 F (36.9 C) 98.3 F (36.8 C) 98.3 F (36.8 C)  TempSrc:  Oral Oral Oral  SpO2: 100%  98% 99%  Weight:      Height:        Intake/Output Summary (Last 24 hours) at 05/30/2024 0757 Last data filed at 05/30/2024 0500 Gross per 24 hour  Intake 847.13 ml  Output --  Net 847.13 ml      05/28/2024   11:04 AM 05/26/2024    6:31 PM 05/26/2024    8:59 AM  Last 3 Weights  Weight (lbs) 162 lb 169 lb 9.6 oz 179 lb  Weight (kg) 73.483 kg 76.93 kg 81.194 kg      Telemetry/ECG  Normal sinus rhythm with heart rates in the 70s to 100s- Personally Reviewed  Physical Exam  GEN: No acute distress.  Alert and orientated on room air. Neck: No JVD Cardiac: RRR, no murmurs, rubs, or gallops.  Respiratory: Clear to auscultation bilaterally. GI: Soft, nontender, non-distended  MS: No edema. Right radial sight has a small hematoma and slight swelling. Pulses 2 + bilaterally. Denies any pain or numbness.  Cardiac catheterization   Prox RCA-1 lesion is 20% stenosed.  Prox RCA-2 lesion is 70% stenosed.   Dist RCA-1 lesion is 95% stenosed.   Dist RCA-2 lesion is 85% stenosed.  RPDA lesion is 85% stenosed.   Ost LAD to Mid LAD lesion is 20% stenosed with 70% stenosed side branch in 1st Diag.   Mid LAD-1 lesion is 50% stenosed.  Mid LAD-2 lesion is 70% stenosed. Mid LAD-3 lesion is 50% stenosed with 40% stenosed side branch in 2nd Diag. (VFFR for both the sidebranch and LAD was 0.77)   Mid Cx to Dist Cx lesion is 50% stenosed with 60% stenosed side branch in 2nd Mrg.   ------------------------------------    LV end diastolic pressure is normal.  There is no aortic valve stenosis    Assessment & Plan   NSTEMI Coronary calcifications Hyperlipidemia Having recurrent chest pain at rest. Extensive coronary calcifications seen on chest CT on 02/2022. Elevated high sensitivity Troponin T 81 > 80, Troponin I 222 > 229. LDL elevated at 150.  Goal less than 55. TTE on 05/28/24 showed a normal LVEF of 60-65%, No RWMA, G1DD, normal RV function, and mild aortic root dilation (41mm). Had diagnostic cardiac cath on 05/29/2024 showed severe multivessel CAD.  The culprit lesion was 95% stenosis in the distal RCA, the patient also had heavily calcified left main, LAD, and LCx.  LVEDP was normal.  See report above for additional details.  The patient was recommended to see cardiovascular thoracic surgery.  CT surgery saw the patient and is tentatively planning on CABG later on this week. - Reviewed radial cath sight care. Continue Aspirin  81mg  daily Increase to Crestor  40mg  daily for more aggressive LDL management given severity of CAD. Continue IV heparin     CKD stage IIIB Creatinine up to 2.31 on this am at 4:03. This is up from 1.95 on 05/26/24 and 2.09 yesterday. Has been on  IV fluids over night.  Creatinine this a.m. was 1.80.    HTN BP's have been labile.  Most recent BP 127/72 Continue Cardizem 180mg  daily.     Hyperthyroidism On methimazole  Management per primary    Otherwise management per primary.    For questions or updates, please contact [facility] Please consult [email or web address] for contact info under   Signed, Morse Clause, PA-C   History and all data above reviewed.  I personally took the history today, performed the physical exam and formulated the substantive portion of the assessment and plan.   No chest pain.  No SOB.  I reviewed all relevant tests and studies. Patient examined.  I agree with the findings as above.  The patient exam reveals COR:RRR  ,  Lungs: Clear  ,   Abd: Positive bowel sounds, no rebound no guarding, Ext Mild bruising at the right radial site  .  All available labs, radiology testing, previous records reviewed. Agree with documented assessment and plan.   NQWMI:  Three vessel CAD as recorded.  Plan is for CABG per Dr. Kerrin.  CKD IIIB:  Creat is down today.  Need to follow closely and will avoid ACE/ARB/ARNI.   Dyslipidemia:  LDL has not been controlled prior to this admission.  Now on high dose statin. LPa pending.  We discussed diet.  HTN:  Needs tight BP control.  Currently not consistently at target.  We discussed this.  We will titrate meds based on post op BPs.   Victor Price  10:56 AM  05/30/2024

## 2024-05-30 NOTE — Progress Notes (Signed)
 [facility]  PROGRESS NOTE  Victor Price FMW:[id number] DOB: 1951/03/18 DOA: 05/26/2024 PCP: [name], MD   Brief HPI:   73 y.o. male with medical history significant for CKD stage IIIb, HTN, BPH, erectile dysfunction, hypogonadism on testosterone  replacement, degenerative disc disease, bronchiectasis, and hyperthyroidism presented to hospital with chest pain for 4 to 5 days which was intermittent pressure-like without any shortness of breath.  In the ED, patient had slightly elevated blood pressure.  Creatinine on presentation was 1.9 troponin 81 followed by 80.  EKG shows sinus rhythm with no ischemic changes. CXR showed no active disease. Pt received aspirin  324 mg x 1.  Cardiology was consulted and patient was advised hospital for further evaluation and treatment.      Assessment/Plan:   Chest pain - Presented with 5 days of intermittent chest pain, EKG without ischemic changes -Cardiology was consulted, patient started on heparin gtt. -Underwent cardiac catheterization, coronary angiography which showed multivessel disease -Plan for CABG later this week -Continue aspirin , Crestor , heparin gtt. per pharmacy   Essential hypertension -Continue holding HCTZ, bisoprolol  as per cardiology recommendation -Continue diltiazem   CKD 3B -Creatinine stable at 1.80 -At baseline   Prediabetes Last hemoglobin A1c 5.3% 2 weeks ago.   Hold Farxiga .    Hyperthyroidism with Hx of Graves' disease - TSH within normal limits at 2.32,  2 weeks ago.  - Continue methimazole     Hyperlipidemia Lipid panel 6 months back was with LDL of 167.   - Repeat lipid profile showed total cholesterol 247, LDL 150   Bronchiectasis - Chronic and stable continue Anoro Ellipta , as needed DuoNebs.  No acute issues.   Hypogonadism On testosterone  2% gel    Insomnia - Continue as needed trazodone    BPH Continue Flomax .    DVT prophylaxis: Heparin  Medications     aspirin  EC  81  mg Oral Daily   diltiazem  180 mg Oral Daily   fluticasone   2 spray Each Nare Daily   gabapentin   600 mg Oral BID   methimazole   10 mg Oral BID   rosuvastatin   20 mg Oral Daily   sodium chloride  flush  3 mL Intravenous Q12H   tamsulosin   0.4 mg Oral BID   testosterone   5 g Transdermal Daily   umeclidinium-vilanterol  1 puff Inhalation Daily     Data Reviewed:   CBG:  No results for input(s): GLUCAP in the last 168 hours.  SpO2: 100 % O2 Flow Rate (L/min): 2 L/min    Vitals:   05/30/24 0000 05/30/24 0442 05/30/24 0807 05/30/24 0830  BP: 135/78 125/69  (!) 150/81  Pulse: 84 89 (!) 104 100  Resp: 16 15 18 18   Temp: 98.3 F (36.8 C) 98.3 F (36.8 C)  98.4 F (36.9 C)  TempSrc: Oral Oral  Oral  SpO2: 98% 99%  100%  Weight:      Height:          Data Reviewed:  Basic Metabolic Panel: Recent Labs  Lab 05/26/24 0903 05/28/24 0428 05/29/24 0403 05/29/24 1026 05/30/24 0456  NA 137 136 138 138 138  K 4.0 4.2 4.5 4.3 4.7  CL 100 102 105 108 110  CO2 25 22 24 22  21*  GLUCOSE 99 104* 120* 100* 96  BUN 22 24* 33* 28* 21  CREATININE 1.95* 2.09* 2.31* 2.03* 1.80*  CALCIUM  9.9 9.3 8.8* 8.7* 8.4*  MG  --   --  2.3  --  2.1  CBC: Recent Labs  Lab 05/26/24 0903 05/28/24 0428 05/29/24 0403 05/30/24 0456  WBC 6.0 6.7 5.8 5.8  HGB 13.3 14.6 13.7 12.6*  HCT 39.0 43.9 41.9 38.4*  MCV 90.3 90.7 92.7 92.3  PLT 321 328 328 310    LFT Recent Labs  Lab 05/26/24 0903  AST 25  ALT 18  ALKPHOS 75  BILITOT 0.4  PROT 7.2  ALBUMIN  4.3     Antibiotics: Anti-infectives (From admission, onward)    None        CONSULTS cardiology  Code Status: Full code  Family Communication: No family at bedside     Subjective   Denies chest pain   Objective    Physical Examination:  General-appears in no acute distress Heart-S1-S2, regular, no murmur auscultated Lungs-clear to auscultation bilaterally, no wheezing or crackles auscultated Abdomen-soft,  nontender, no organomegaly Extremities-no edema in the lower extremities Neuro-alert, oriented x3, no focal deficit noted  Status is: Inpatient:         Victor Price   [facility] If 7PM-7AM, please contact night-coverage at [email or web address], Office  [phone]   05/30/2024, 8:59 AM  LOS: 1 day

## 2024-05-30 NOTE — Progress Notes (Signed)
 Pre-CABG exams have been completed   Results can be found under chart review under CV PROC. 05/30/2024 4:52 PM Hali Balgobin RVT, RDMS

## 2024-05-30 NOTE — Anesthesia Preprocedure Evaluation (Addendum)
 Anesthesia Evaluation  Patient identified by MRN, date of birth, ID band Patient awake    Reviewed: Allergy  & Precautions, NPO status , Patient's Chart, lab work & pertinent test results  Airway Mallampati: II  TM Distance: >3 FB Neck ROM: Full    Dental no notable dental hx. (+) Dental Advisory Given, Teeth Intact   Pulmonary asthma    Pulmonary exam normal breath sounds clear to auscultation       Cardiovascular hypertension, Pt. on medications and Pt. on home beta blockers + Past MI  Normal cardiovascular exam Rhythm:Regular Rate:Normal  Echo 05/2024  1. Left ventricular ejection fraction, by estimation, is 60 to 65%. The  left ventricle has normal function. The left ventricle has no regional  wall motion abnormalities. Left ventricular diastolic parameters are  consistent with Grade I diastolic dysfunction (impaired relaxation).   2. Right ventricular systolic function is normal. The right ventricular  size is normal. Tricuspid regurgitation signal is inadequate for assessing  PA pressure.   3. The mitral valve is normal in structure. Trivial mitral valve  regurgitation. No evidence of mitral stenosis.   4. The aortic valve is tricuspid. There is mild calcification of the  aortic valve. Aortic valve regurgitation is not visualized. Aortic valve  sclerosis is present, with no evidence of aortic valve stenosis.   5. Aortic dilatation noted. There is mild dilatation of the aortic root,  measuring 41 mm.   6. The inferior vena cava is normal in size with greater than 50%  respiratory variability, suggesting right atrial pressure of 3 mmHg.    Cath  Severe Multivessel CAD:  20% proximal RCA followed by 70% eccentric lesion in the prox-mid RCA followed by the CULPRIT LESION which is a 95% focal lesion in the distal RCA and then an extensive 85% stenosis from distal RCA into PDA crossing the PAV  Heavily calcified Left Main  into the LAD and LCx: Minimal proximal disease with 70% ostial to proximal 1st Diag, 50 with focal 70% stenosis mid LAD prior to 2nd Diag then 40% at the bifurcation of D2 with 50% ostial 2nd Diag (the FFR of the 70% LAD lesion was 0.77 for both the LAD and 2nd Diag)  50 to 60% proximal LCx into the AV groove and OM1 branch. Borderline significant.  Normal LVEDP        Neuro/Psych  Neuromuscular disease    GI/Hepatic Neg liver ROS,GERD  ,,  Endo/Other  Hypothyroidism Hyperthyroidism   Renal/GU Renal InsufficiencyRenal disease     Musculoskeletal  (+) Arthritis , Osteoarthritis,  right hip osteoarthritis    Abdominal   Peds  Hematology negative hematology ROS (+) Plt 355k   Anesthesia Other Findings   Reproductive/Obstetrics                              Anesthesia Physical Anesthesia Plan  ASA: 4  Anesthesia Plan: General   Post-op Pain Management:    Induction: Intravenous  PONV Risk Score and Plan: 3 and Midazolam   Airway Management Planned: Oral ETT  Additional Equipment: Arterial line, CVP, PA Cath, TEE and Ultrasound Guidance Line Placement  Intra-op Plan:   Post-operative Plan: Post-operative intubation/ventilation  Informed Consent: I have reviewed the patients History and Physical, chart, labs and discussed the procedure including the risks, benefits and alternatives for the proposed anesthesia with the patient or authorized representative who has indicated his/her understanding and acceptance.     Dental advisory  given  Plan Discussed with: CRNA  Anesthesia Plan Comments: (PAT note written 08/25/2023 by Allison Zelenak, PA-C.  )        Anesthesia Quick Evaluation

## 2024-05-30 NOTE — Plan of Care (Signed)
   Problem: Activity: Goal: Risk for activity intolerance will decrease Outcome: Adequate for Discharge

## 2024-05-30 NOTE — Progress Notes (Signed)
 1 Day Post-Op Procedure(s) (LRB): LEFT HEART CATH AND CORONARY ANGIOGRAPHY (N/A) Coronary Pressure/FFR w/3D Mapping (N/A) Subjective: No complaints this afternoon  Objective: Vital signs in last 24 hours: Temp:  [98.1 F (36.7 C)-98.5 F (36.9 C)] 98.1 F (36.7 C) (10/22 1218) Pulse Rate:  [76-107] 107 (10/22 1218) Cardiac Rhythm: Sinus tachycardia (10/22 1153) Resp:  [15-20] 20 (10/22 1218) BP: (125-172)/(69-103) 144/103 (10/22 1220) SpO2:  [98 %-100 %] 100 % (10/22 1218)  Hemodynamic parameters for last 24 hours:    Intake/Output from previous day: 10/21 0701 - 10/22 0700 In: 847.1 [I.V.:847.1] Out: -  Intake/Output this shift: Total I/O In: 483 [P.O.:480; I.V.:3] Out: -   General appearance: alert, cooperative, and no distress Neurologic: intact Heart: regular rate and rhythm  Lab Results: Recent Labs    05/29/24 0403 05/30/24 0456  WBC 5.8 5.8  HGB 13.7 12.6*  HCT 41.9 38.4*  PLT 328 310   BMET:  Recent Labs    05/29/24 1026 05/30/24 0456  NA 138 138  K 4.3 4.7  CL 108 110  CO2 22 21*  GLUCOSE 100* 96  BUN 28* 21  CREATININE 2.03* 1.80*  CALCIUM  8.7* 8.4*    PT/INR: No results for input(s): LABPROT, INR in the last 72 hours. ABG    Component Value Date/Time   PHART 7.43 05/30/2024 1610   HCO3 27.9 05/30/2024 1610   O2SAT 98.3 05/30/2024 1610   CBG (last 3)  No results for input(s): GLUCAP in the last 72 hours.  Assessment/Plan: S/P Procedure(s) (LRB): LEFT HEART CATH AND CORONARY ANGIOGRAPHY (N/A) Coronary Pressure/FFR w/3D Mapping (N/A) 3 vessel CAD, nonSTEMI For CABG tomorrow AM All questions answered   LOS: 1 day    Victor Price 05/30/2024

## 2024-05-30 NOTE — Progress Notes (Signed)
 Discussed with pt IS (2500 ml), sternal precautions, mobility post op, and d/c planning. He was very receptive with appropriate questions. He is fit and moves well. Encouraged light ambulation today. His wife will be with him at d/c.  [phone] Aliene Aris BS, ACSM-CEP 05/30/2024 11:19 AM

## 2024-05-30 NOTE — Progress Notes (Signed)
 PHARMACY - ANTICOAGULATION CONSULT NOTE  Pharmacy Consult for Heparin Indication: chest pain/ACS  Allergies  Allergen Reactions   Losartan  Other (See Comments)    Lightheadedness, dizziness, syncope   Metoprolol  Other (See Comments)    dizziness   Nsaids Other (See Comments)    CKD 3    Patient Measurements: Height: 6' (182.9 cm) Weight: 73.5 kg (162 lb) IBW/kg (Calculated) : 77.6 HEPARIN DW (KG): 76.9  Vital Signs: Temp: 98.4 F (36.9 C) (10/22 0830) Temp Source: Oral (10/22 0830) BP: 150/81 (10/22 0830) Pulse Rate: 100 (10/22 0830)  Labs: Recent Labs    05/27/24 1938 05/27/24 2119 05/28/24 0428 05/28/24 0428 05/29/24 0403 05/29/24 1026 05/30/24 0456 05/30/24 0812  HGB  --   --  14.6   < > 13.7  --  12.6*  --   HCT  --   --  43.9  --  41.9  --  38.4*  --   PLT  --   --  328  --  328  --  310  --   HEPARINUNFRC  --   --  0.33  --  0.27*  --   --  0.26*  CREATININE  --   --  2.09*   < > 2.31* 2.03* 1.80*  --   TROPONINIHS 222* 229*  --   --   --   --   --   --    < > = values in this interval not displayed.    Estimated Creatinine Clearance: 39.1 mL/min (A) (by C-G formula based on SCr of 1.8 mg/dL (H)).   Medical History: Past Medical History:  Diagnosis Date   BPH (benign prostatic hyperplasia)    Bronchiectasis with acute lower respiratory infection (HCC) 08/13/2019   Bronchitis    inhaler prn   Cancer (HCC)    skin cancer?   CKD (chronic kidney disease) stage 3, GFR 30-59 ml/min (HCC)    ED (erectile dysfunction)    GERD (gastroesophageal reflux disease)    HLD (hyperlipidemia)    Hypertension    Hyperthyroidism    Multinodular thyroid     Pneumonia    gets recurrent PNA in left lung, recent dx 08/13/19 left lower lobe   Seasonal allergies    Assessment: 41 yoM admitted with CP. Pharmacy dosing IV heparin. No AC PTA. Pt s/p LHC with mvCAD, planning CABG evaluation, heparin resumed.  Heparin level slightly low at 0.26, CBC ok, radial site ok, no  S/Sx bleeding.  Goal of Therapy:  Heparin level 0.3-0.7 units/ml Monitor platelets by anticoagulation protocol: Yes   Plan:  Increase heparin to 1400 units/h Daily heparin level and CBC  Ozell Jamaica, PharmD, [place], [facility] Clinical Pharmacist [phone] Please check AMION for all [facility] Pharmacy numbers 05/30/2024

## 2024-05-31 ENCOUNTER — Inpatient Hospital Stay (HOSPITAL_COMMUNITY)

## 2024-05-31 ENCOUNTER — Inpatient Hospital Stay (HOSPITAL_COMMUNITY): Payer: Self-pay | Admitting: Certified Registered Nurse Anesthetist

## 2024-05-31 ENCOUNTER — Telehealth: Payer: Self-pay

## 2024-05-31 ENCOUNTER — Encounter (HOSPITAL_COMMUNITY)
Admission: EM | Disposition: A | Payer: Self-pay | Source: Home / Self Care | Attending: Thoracic Surgery (Cardiothoracic Vascular Surgery)

## 2024-05-31 ENCOUNTER — Encounter (HOSPITAL_COMMUNITY): Payer: Self-pay | Admitting: Hospitalist

## 2024-05-31 DIAGNOSIS — E039 Hypothyroidism, unspecified: Secondary | ICD-10-CM | POA: Diagnosis not present

## 2024-05-31 DIAGNOSIS — Z951 Presence of aortocoronary bypass graft: Secondary | ICD-10-CM | POA: Diagnosis not present

## 2024-05-31 DIAGNOSIS — R918 Other nonspecific abnormal finding of lung field: Secondary | ICD-10-CM | POA: Diagnosis not present

## 2024-05-31 DIAGNOSIS — J90 Pleural effusion, not elsewhere classified: Secondary | ICD-10-CM | POA: Diagnosis not present

## 2024-05-31 DIAGNOSIS — I2510 Atherosclerotic heart disease of native coronary artery without angina pectoris: Secondary | ICD-10-CM

## 2024-05-31 DIAGNOSIS — I129 Hypertensive chronic kidney disease with stage 1 through stage 4 chronic kidney disease, or unspecified chronic kidney disease: Secondary | ICD-10-CM

## 2024-05-31 DIAGNOSIS — N1832 Chronic kidney disease, stage 3b: Secondary | ICD-10-CM | POA: Diagnosis not present

## 2024-05-31 DIAGNOSIS — Z452 Encounter for adjustment and management of vascular access device: Secondary | ICD-10-CM | POA: Diagnosis not present

## 2024-05-31 DIAGNOSIS — I214 Non-ST elevation (NSTEMI) myocardial infarction: Secondary | ICD-10-CM

## 2024-05-31 DIAGNOSIS — J9811 Atelectasis: Secondary | ICD-10-CM | POA: Diagnosis not present

## 2024-05-31 DIAGNOSIS — Z4682 Encounter for fitting and adjustment of non-vascular catheter: Secondary | ICD-10-CM | POA: Diagnosis not present

## 2024-05-31 DIAGNOSIS — Z48812 Encounter for surgical aftercare following surgery on the circulatory system: Secondary | ICD-10-CM | POA: Diagnosis not present

## 2024-05-31 HISTORY — PX: INTRAOPERATIVE TRANSESOPHAGEAL ECHOCARDIOGRAM: SHX5062

## 2024-05-31 HISTORY — PX: CORONARY ARTERY BYPASS GRAFT: SHX141

## 2024-05-31 LAB — POCT I-STAT 7, (LYTES, BLD GAS, ICA,H+H)
Acid-base deficit: 5 mmol/L — ABNORMAL HIGH (ref 0.0–2.0)
Acid-base deficit: 6 mmol/L — ABNORMAL HIGH (ref 0.0–2.0)
Acid-base deficit: 3 mmol/L — ABNORMAL HIGH (ref 0.0–2.0)
Acid-base deficit: 3 mmol/L — ABNORMAL HIGH (ref 0.0–2.0)
Acid-base deficit: 5 mmol/L — ABNORMAL HIGH (ref 0.0–2.0)
Acid-base deficit: 4 mmol/L — ABNORMAL HIGH (ref 0.0–2.0)
Acid-base deficit: 4 mmol/L — ABNORMAL HIGH (ref 0.0–2.0)
Acid-base deficit: 6 mmol/L — ABNORMAL HIGH (ref 0.0–2.0)
Bicarbonate: 21.9 mmol/L (ref 20.0–28.0)
Bicarbonate: 19.4 mmol/L — ABNORMAL LOW (ref 20.0–28.0)
Bicarbonate: 21.4 mmol/L (ref 20.0–28.0)
Bicarbonate: 22.1 mmol/L (ref 20.0–28.0)
Bicarbonate: 19.7 mmol/L — ABNORMAL LOW (ref 20.0–28.0)
Bicarbonate: 20.8 mmol/L (ref 20.0–28.0)
Bicarbonate: 21.3 mmol/L (ref 20.0–28.0)
Bicarbonate: 20.8 mmol/L (ref 20.0–28.0)
Calcium, Ion: 1.24 mmol/L (ref 1.15–1.40)
Calcium, Ion: 1.03 mmol/L — ABNORMAL LOW (ref 1.15–1.40)
Calcium, Ion: 1.04 mmol/L — ABNORMAL LOW (ref 1.15–1.40)
Calcium, Ion: 1.06 mmol/L — ABNORMAL LOW (ref 1.15–1.40)
Calcium, Ion: 1.12 mmol/L — ABNORMAL LOW (ref 1.15–1.40)
Calcium, Ion: 1.11 mmol/L — ABNORMAL LOW (ref 1.15–1.40)
Calcium, Ion: 1.11 mmol/L — ABNORMAL LOW (ref 1.15–1.40)
Calcium, Ion: 1.16 mmol/L (ref 1.15–1.40)
HCT: 34 % — ABNORMAL LOW (ref 39.0–52.0)
HCT: 24 % — ABNORMAL LOW (ref 39.0–52.0)
HCT: 22 % — ABNORMAL LOW (ref 39.0–52.0)
HCT: 25 % — ABNORMAL LOW (ref 39.0–52.0)
HCT: 23 % — ABNORMAL LOW (ref 39.0–52.0)
HCT: 24 % — ABNORMAL LOW (ref 39.0–52.0)
HCT: 26 % — ABNORMAL LOW (ref 39.0–52.0)
HCT: 26 % — ABNORMAL LOW (ref 39.0–52.0)
Hemoglobin: 11.6 g/dL — ABNORMAL LOW (ref 13.0–17.0)
Hemoglobin: 8.2 g/dL — ABNORMAL LOW (ref 13.0–17.0)
Hemoglobin: 7.5 g/dL — ABNORMAL LOW (ref 13.0–17.0)
Hemoglobin: 8.5 g/dL — ABNORMAL LOW (ref 13.0–17.0)
Hemoglobin: 7.8 g/dL — ABNORMAL LOW (ref 13.0–17.0)
Hemoglobin: 8.2 g/dL — ABNORMAL LOW (ref 13.0–17.0)
Hemoglobin: 8.8 g/dL — ABNORMAL LOW (ref 13.0–17.0)
Hemoglobin: 8.8 g/dL — ABNORMAL LOW (ref 13.0–17.0)
O2 Saturation: 100 %
O2 Saturation: 100 %
O2 Saturation: 100 %
O2 Saturation: 100 %
O2 Saturation: 100 %
O2 Saturation: 100 %
O2 Saturation: 99 %
O2 Saturation: 97 %
Patient temperature: 35.9
Patient temperature: 36.1
Patient temperature: 36.5
Patient temperature: 36.2
Potassium: 4.4 mmol/L (ref 3.5–5.1)
Potassium: 4.9 mmol/L (ref 3.5–5.1)
Potassium: 5.4 mmol/L — ABNORMAL HIGH (ref 3.5–5.1)
Potassium: 5.9 mmol/L — ABNORMAL HIGH (ref 3.5–5.1)
Potassium: 4.7 mmol/L (ref 3.5–5.1)
Potassium: 5.3 mmol/L — ABNORMAL HIGH (ref 3.5–5.1)
Potassium: 5.6 mmol/L — ABNORMAL HIGH (ref 3.5–5.1)
Potassium: 5.6 mmol/L — ABNORMAL HIGH (ref 3.5–5.1)
Sodium: 140 mmol/L (ref 135–145)
Sodium: 138 mmol/L (ref 135–145)
Sodium: 138 mmol/L (ref 135–145)
Sodium: 140 mmol/L (ref 135–145)
Sodium: 140 mmol/L (ref 135–145)
Sodium: 140 mmol/L (ref 135–145)
Sodium: 140 mmol/L (ref 135–145)
Sodium: 138 mmol/L (ref 135–145)
TCO2: 23 mmol/L (ref 22–32)
TCO2: 21 mmol/L — ABNORMAL LOW (ref 22–32)
TCO2: 22 mmol/L (ref 22–32)
TCO2: 23 mmol/L (ref 22–32)
TCO2: 21 mmol/L — ABNORMAL LOW (ref 22–32)
TCO2: 22 mmol/L (ref 22–32)
TCO2: 23 mmol/L (ref 22–32)
TCO2: 22 mmol/L (ref 22–32)
pCO2 arterial: 45.1 mmHg (ref 32–48)
pCO2 arterial: 36.3 mmHg (ref 32–48)
pCO2 arterial: 34.3 mmHg (ref 32–48)
pCO2 arterial: 38.5 mmHg (ref 32–48)
pCO2 arterial: 32.7 mmHg (ref 32–48)
pCO2 arterial: 35.8 mmHg (ref 32–48)
pCO2 arterial: 39.6 mmHg (ref 32–48)
pCO2 arterial: 45.1 mmHg (ref 32–48)
pH, Arterial: 7.293 — ABNORMAL LOW (ref 7.35–7.45)
pH, Arterial: 7.337 — ABNORMAL LOW (ref 7.35–7.45)
pH, Arterial: 7.368 (ref 7.35–7.45)
pH, Arterial: 7.403 (ref 7.35–7.45)
pH, Arterial: 7.383 (ref 7.35–7.45)
pH, Arterial: 7.368 (ref 7.35–7.45)
pH, Arterial: 7.337 — ABNORMAL LOW (ref 7.35–7.45)
pH, Arterial: 7.268 — ABNORMAL LOW (ref 7.35–7.45)
pO2, Arterial: 399 mmHg — ABNORMAL HIGH (ref 83–108)
pO2, Arterial: 445 mmHg — ABNORMAL HIGH (ref 83–108)
pO2, Arterial: 203 mmHg — ABNORMAL HIGH (ref 83–108)
pO2, Arterial: 331 mmHg — ABNORMAL HIGH (ref 83–108)
pO2, Arterial: 185 mmHg — ABNORMAL HIGH (ref 83–108)
pO2, Arterial: 187 mmHg — ABNORMAL HIGH (ref 83–108)
pO2, Arterial: 164 mmHg — ABNORMAL HIGH (ref 83–108)
pO2, Arterial: 102 mmHg (ref 83–108)

## 2024-05-31 LAB — HEMOGLOBIN AND HEMATOCRIT, BLOOD
HCT: 23.4 % — ABNORMAL LOW (ref 39.0–52.0)
Hemoglobin: 7.6 g/dL — ABNORMAL LOW (ref 13.0–17.0)

## 2024-05-31 LAB — ECHO INTRAOPERATIVE TEE
AV Mean grad: 3 mmHg
AV Peak grad: 4.1 mmHg
Ao pk vel: 1.01 m/s
Height: 72 in
Weight: 2640 [oz_av]

## 2024-05-31 LAB — POCT I-STAT, CHEM 8
BUN: 20 mg/dL (ref 8–23)
BUN: 21 mg/dL (ref 8–23)
BUN: 18 mg/dL (ref 8–23)
BUN: 18 mg/dL (ref 8–23)
BUN: 19 mg/dL (ref 8–23)
Calcium, Ion: 1.25 mmol/L (ref 1.15–1.40)
Calcium, Ion: 1.06 mmol/L — ABNORMAL LOW (ref 1.15–1.40)
Calcium, Ion: 1.04 mmol/L — ABNORMAL LOW (ref 1.15–1.40)
Calcium, Ion: 1.05 mmol/L — ABNORMAL LOW (ref 1.15–1.40)
Calcium, Ion: 1.06 mmol/L — ABNORMAL LOW (ref 1.15–1.40)
Chloride: 107 mmol/L (ref 98–111)
Chloride: 107 mmol/L (ref 98–111)
Chloride: 106 mmol/L (ref 98–111)
Chloride: 107 mmol/L (ref 98–111)
Chloride: 107 mmol/L (ref 98–111)
Creatinine, Ser: 1.7 mg/dL — ABNORMAL HIGH (ref 0.61–1.24)
Creatinine, Ser: 1.6 mg/dL — ABNORMAL HIGH (ref 0.61–1.24)
Creatinine, Ser: 1.3 mg/dL — ABNORMAL HIGH (ref 0.61–1.24)
Creatinine, Ser: 1.5 mg/dL — ABNORMAL HIGH (ref 0.61–1.24)
Creatinine, Ser: 1.5 mg/dL — ABNORMAL HIGH (ref 0.61–1.24)
Glucose, Bld: 108 mg/dL — ABNORMAL HIGH (ref 70–99)
Glucose, Bld: 123 mg/dL — ABNORMAL HIGH (ref 70–99)
Glucose, Bld: 114 mg/dL — ABNORMAL HIGH (ref 70–99)
Glucose, Bld: 128 mg/dL — ABNORMAL HIGH (ref 70–99)
Glucose, Bld: 123 mg/dL — ABNORMAL HIGH (ref 70–99)
HCT: 33 % — ABNORMAL LOW (ref 39.0–52.0)
HCT: 30 % — ABNORMAL LOW (ref 39.0–52.0)
HCT: 20 % — ABNORMAL LOW (ref 39.0–52.0)
HCT: 27 % — ABNORMAL LOW (ref 39.0–52.0)
HCT: 24 % — ABNORMAL LOW (ref 39.0–52.0)
Hemoglobin: 11.2 g/dL — ABNORMAL LOW (ref 13.0–17.0)
Hemoglobin: 10.2 g/dL — ABNORMAL LOW (ref 13.0–17.0)
Hemoglobin: 6.8 g/dL — CL (ref 13.0–17.0)
Hemoglobin: 9.2 g/dL — ABNORMAL LOW (ref 13.0–17.0)
Hemoglobin: 8.2 g/dL — ABNORMAL LOW (ref 13.0–17.0)
Potassium: 4.4 mmol/L (ref 3.5–5.1)
Potassium: 4.8 mmol/L (ref 3.5–5.1)
Potassium: 4.9 mmol/L (ref 3.5–5.1)
Potassium: 6.2 mmol/L — ABNORMAL HIGH (ref 3.5–5.1)
Potassium: 5.5 mmol/L — ABNORMAL HIGH (ref 3.5–5.1)
Sodium: 141 mmol/L (ref 135–145)
Sodium: 140 mmol/L (ref 135–145)
Sodium: 139 mmol/L (ref 135–145)
Sodium: 139 mmol/L (ref 135–145)
Sodium: 140 mmol/L (ref 135–145)
TCO2: 24 mmol/L (ref 22–32)
TCO2: 22 mmol/L (ref 22–32)
TCO2: 22 mmol/L (ref 22–32)
TCO2: 24 mmol/L (ref 22–32)
TCO2: 22 mmol/L (ref 22–32)

## 2024-05-31 LAB — BASIC METABOLIC PANEL WITH GFR
Anion gap: 9 (ref 5–15)
Anion gap: 8 (ref 5–15)
BUN: 19 mg/dL (ref 8–23)
BUN: 19 mg/dL (ref 8–23)
CO2: 22 mmol/L (ref 22–32)
CO2: 20 mmol/L — ABNORMAL LOW (ref 22–32)
Calcium: 8.2 mg/dL — ABNORMAL LOW (ref 8.9–10.3)
Calcium: 7.6 mg/dL — ABNORMAL LOW (ref 8.9–10.3)
Chloride: 107 mmol/L (ref 98–111)
Chloride: 110 mmol/L (ref 98–111)
Creatinine, Ser: 1.68 mg/dL — ABNORMAL HIGH (ref 0.61–1.24)
Creatinine, Ser: 1.72 mg/dL — ABNORMAL HIGH (ref 0.61–1.24)
GFR, Estimated: 43 mL/min — ABNORMAL LOW (ref >60)
GFR, Estimated: 42 mL/min — ABNORMAL LOW (ref >60)
Glucose, Bld: 116 mg/dL — ABNORMAL HIGH (ref 70–99)
Glucose, Bld: 148 mg/dL — ABNORMAL HIGH (ref 70–99)
Potassium: 3.9 mmol/L (ref 3.5–5.1)
Potassium: 5.5 mmol/L — ABNORMAL HIGH (ref 3.5–5.1)
Sodium: 138 mmol/L (ref 135–145)
Sodium: 138 mmol/L (ref 135–145)

## 2024-05-31 LAB — CBC
HCT: 36.7 % — ABNORMAL LOW (ref 39.0–52.0)
HCT: 25.8 % — ABNORMAL LOW (ref 39.0–52.0)
HCT: 29.8 % — ABNORMAL LOW (ref 39.0–52.0)
Hemoglobin: 12.1 g/dL — ABNORMAL LOW (ref 13.0–17.0)
Hemoglobin: 8.5 g/dL — ABNORMAL LOW (ref 13.0–17.0)
Hemoglobin: 9.6 g/dL — ABNORMAL LOW (ref 13.0–17.0)
MCH: 30 pg (ref 26.0–34.0)
MCH: 30.7 pg (ref 26.0–34.0)
MCH: 30.4 pg (ref 26.0–34.0)
MCHC: 33 g/dL (ref 30.0–36.0)
MCHC: 32.9 g/dL (ref 30.0–36.0)
MCHC: 32.2 g/dL (ref 30.0–36.0)
MCV: 91.1 fL (ref 80.0–100.0)
MCV: 93.1 fL (ref 80.0–100.0)
MCV: 94.3 fL (ref 80.0–100.0)
Platelets: 295 K/uL (ref 150–400)
Platelets: 168 K/uL (ref 150–400)
Platelets: 249 K/uL (ref 150–400)
RBC: 4.03 MIL/uL — ABNORMAL LOW (ref 4.22–5.81)
RBC: 2.77 MIL/uL — ABNORMAL LOW (ref 4.22–5.81)
RBC: 3.16 MIL/uL — ABNORMAL LOW (ref 4.22–5.81)
RDW: 13.4 % (ref 11.5–15.5)
RDW: 13.5 % (ref 11.5–15.5)
RDW: 13.5 % (ref 11.5–15.5)
WBC: 5.3 K/uL (ref 4.0–10.5)
WBC: 12.7 K/uL — ABNORMAL HIGH (ref 4.0–10.5)
WBC: 14.9 K/uL — ABNORMAL HIGH (ref 4.0–10.5)
nRBC: 0 % (ref 0.0–0.2)
nRBC: 0 % (ref 0.0–0.2)
nRBC: 0 % (ref 0.0–0.2)

## 2024-05-31 LAB — PLATELET COUNT: Platelets: 196 K/uL (ref 150–400)

## 2024-05-31 LAB — HEPARIN LEVEL (UNFRACTIONATED): Heparin Unfractionated: 0.45 [IU]/mL (ref 0.30–0.70)

## 2024-05-31 LAB — GLUCOSE, CAPILLARY
Glucose-Capillary: 136 mg/dL — ABNORMAL HIGH (ref 70–99)
Glucose-Capillary: 139 mg/dL — ABNORMAL HIGH (ref 70–99)
Glucose-Capillary: 123 mg/dL — ABNORMAL HIGH (ref 70–99)
Glucose-Capillary: 134 mg/dL — ABNORMAL HIGH (ref 70–99)
Glucose-Capillary: 153 mg/dL — ABNORMAL HIGH (ref 70–99)
Glucose-Capillary: 132 mg/dL — ABNORMAL HIGH (ref 70–99)

## 2024-05-31 LAB — PROTIME-INR
INR: 1.2 (ref 0.8–1.2)
Prothrombin Time: 15.9 s — ABNORMAL HIGH (ref 11.4–15.2)

## 2024-05-31 LAB — MAGNESIUM: Magnesium: 3.2 mg/dL — ABNORMAL HIGH (ref 1.7–2.4)

## 2024-05-31 LAB — APTT: aPTT: 40 s — ABNORMAL HIGH (ref 24–36)

## 2024-05-31 LAB — LIPOPROTEIN A (LPA): Lipoprotein (a): 13.7 nmol/L (ref <75.0)

## 2024-05-31 SURGERY — CORONARY ARTERY BYPASS GRAFTING (CABG)
Anesthesia: General | Site: Chest

## 2024-05-31 MED ORDER — PHENYLEPHRINE 80 MCG/ML (10ML) SYRINGE FOR IV PUSH (FOR BLOOD PRESSURE SUPPORT)
PREFILLED_SYRINGE | INTRAVENOUS | Status: AC
  Filled 2024-05-31: qty 30

## 2024-05-31 MED ORDER — PANTOPRAZOLE SODIUM 40 MG PO TBEC
40.0000 mg | DELAYED_RELEASE_TABLET | Freq: Every day | ORAL | Status: AC
Start: 2024-06-02 — End: ?
  Administered 2024-06-02 – 2024-06-05 (×4): 40 mg via ORAL
  Filled 2024-05-31 (×4): qty 1

## 2024-05-31 MED ORDER — ALBUMIN HUMAN 5 % IV SOLN
250.0000 mL | INTRAVENOUS | Status: DC | PRN
  Administered 2024-05-31 (×3): 12.5 g via INTRAVENOUS
  Filled 2024-05-31: qty 250

## 2024-05-31 MED ORDER — HEPARIN SODIUM (PORCINE) 1000 UNIT/ML IJ SOLN
INTRAMUSCULAR | Status: AC
  Filled 2024-05-31: qty 10

## 2024-05-31 MED ORDER — MAGNESIUM SULFATE 4 GM/100ML IV SOLN
4.0000 g | Freq: Once | INTRAVENOUS | Status: AC
  Administered 2024-05-31: 4 g via INTRAVENOUS
  Filled 2024-05-31: qty 100

## 2024-05-31 MED ORDER — GABAPENTIN 300 MG PO CAPS
300.0000 mg | ORAL_CAPSULE | Freq: Two times a day (BID) | ORAL | Status: DC
Start: 2024-05-31 — End: 2024-05-31

## 2024-05-31 MED ORDER — METOCLOPRAMIDE HCL 5 MG/ML IJ SOLN
10.0000 mg | Freq: Four times a day (QID) | INTRAMUSCULAR | Status: AC
  Administered 2024-05-31 – 2024-06-01 (×6): 10 mg via INTRAVENOUS
  Filled 2024-05-31 (×6): qty 2

## 2024-05-31 MED ORDER — ACETAMINOPHEN 160 MG/5ML PO SOLN
650.0000 mg | Freq: Once | ORAL | Status: AC
  Administered 2024-05-31: 650 mg
  Filled 2024-05-31: qty 20.3

## 2024-05-31 MED ORDER — SODIUM CHLORIDE 0.9% FLUSH
3.0000 mL | Freq: Two times a day (BID) | INTRAVENOUS | Status: DC
  Administered 2024-06-01 – 2024-06-04 (×7): 3 mL via INTRAVENOUS

## 2024-05-31 MED ORDER — ASPIRIN 81 MG PO CHEW
324.0000 mg | CHEWABLE_TABLET | Freq: Every day | ORAL | Status: DC
  Filled 2024-05-31: qty 4

## 2024-05-31 MED ORDER — ROCURONIUM BROMIDE 10 MG/ML (PF) SYRINGE
PREFILLED_SYRINGE | INTRAVENOUS | Status: DC | PRN
  Administered 2024-05-31: 100 mg via INTRAVENOUS
  Administered 2024-05-31 (×2): 50 mg via INTRAVENOUS

## 2024-05-31 MED ORDER — PROTAMINE SULFATE 10 MG/ML IV SOLN
INTRAVENOUS | Status: AC
Start: 2024-05-31 — End: 2024-05-31
  Filled 2024-05-31: qty 25

## 2024-05-31 MED ORDER — DEXTROSE 50 % IV SOLN: 0.0000 mL | INTRAVENOUS | Status: DC | PRN

## 2024-05-31 MED ORDER — EPHEDRINE SULFATE (PRESSORS) 25 MG/5ML IV SOSY
PREFILLED_SYRINGE | INTRAVENOUS | Status: DC | PRN
  Administered 2024-05-31: 10 mg via INTRAVENOUS

## 2024-05-31 MED ORDER — SODIUM BICARBONATE 8.4 % IV SOLN
50.0000 meq | Freq: Once | INTRAVENOUS | Status: AC
  Administered 2024-05-31: 50 meq via INTRAVENOUS

## 2024-05-31 MED ORDER — MIDAZOLAM HCL 2 MG/2ML IJ SOLN
INTRAMUSCULAR | Status: AC
  Filled 2024-05-31: qty 2

## 2024-05-31 MED ORDER — EPHEDRINE 5 MG/ML INJ
INTRAVENOUS | Status: AC
  Filled 2024-05-31: qty 5

## 2024-05-31 MED ORDER — PHENYLEPHRINE 80 MCG/ML (10ML) SYRINGE FOR IV PUSH (FOR BLOOD PRESSURE SUPPORT)
PREFILLED_SYRINGE | INTRAVENOUS | Status: DC | PRN
Start: 2024-05-31 — End: 2024-05-31
  Administered 2024-05-31 (×3): 80 ug via INTRAVENOUS
  Administered 2024-05-31: 40 ug via INTRAVENOUS
  Administered 2024-05-31: 160 ug via INTRAVENOUS
  Administered 2024-05-31: 40 ug via INTRAVENOUS
  Administered 2024-05-31 (×5): 80 ug via INTRAVENOUS
  Administered 2024-05-31: 160 ug via INTRAVENOUS

## 2024-05-31 MED ORDER — CHLORHEXIDINE GLUCONATE 0.12 % MT SOLN
15.0000 mL | OROMUCOSAL | Status: AC
  Administered 2024-05-31: 15 mL via OROMUCOSAL
  Filled 2024-05-31: qty 15

## 2024-05-31 MED ORDER — METOPROLOL TARTRATE 25 MG/10 ML ORAL SUSPENSION: 12.5000 mg | Freq: Two times a day (BID) | ORAL | Status: DC

## 2024-05-31 MED ORDER — PHENYLEPHRINE HCL-NACL 20-0.9 MG/250ML-% IV SOLN
0.0000 ug/min | INTRAVENOUS | Status: DC
  Administered 2024-05-31: 50 ug/min via INTRAVENOUS
  Administered 2024-05-31: 80 ug/min via INTRAVENOUS
  Administered 2024-06-01: 100 ug/min via INTRAVENOUS
  Administered 2024-06-01: 60 ug/min via INTRAVENOUS
  Administered 2024-06-01: 100 ug/min via INTRAVENOUS
  Filled 2024-05-31 (×5): qty 250

## 2024-05-31 MED ORDER — LACTATED RINGERS IV SOLN: INTRAVENOUS | Status: AC

## 2024-05-31 MED ORDER — SODIUM CHLORIDE 0.45 % IV SOLN: INTRAVENOUS | Status: AC | PRN

## 2024-05-31 MED ORDER — LACTATED RINGERS IV SOLN: INTRAVENOUS | Status: DC | PRN

## 2024-05-31 MED ORDER — ASPIRIN 325 MG PO TBEC
325.0000 mg | DELAYED_RELEASE_TABLET | Freq: Every day | ORAL | Status: DC
  Administered 2024-06-01 – 2024-06-03 (×3): 325 mg via ORAL
  Filled 2024-05-31 (×4): qty 1

## 2024-05-31 MED ORDER — PROTAMINE SULFATE 10 MG/ML IV SOLN
INTRAVENOUS | Status: DC | PRN
  Administered 2024-05-31: 300 mg via INTRAVENOUS

## 2024-05-31 MED ORDER — PROPOFOL 10 MG/ML IV BOLUS
INTRAVENOUS | Status: AC
  Filled 2024-05-31: qty 20

## 2024-05-31 MED ORDER — DOCUSATE SODIUM 100 MG PO CAPS
200.0000 mg | ORAL_CAPSULE | Freq: Every day | ORAL | Status: DC
  Administered 2024-06-01 – 2024-06-05 (×5): 200 mg via ORAL
  Filled 2024-05-31 (×5): qty 2

## 2024-05-31 MED ORDER — PHENYLEPHRINE HCL-NACL 20-0.9 MG/250ML-% IV SOLN
INTRAVENOUS | Status: DC | PRN
  Administered 2024-05-31: 25 ug/min via INTRAVENOUS

## 2024-05-31 MED ORDER — FENTANYL CITRATE (PF) 250 MCG/5ML IJ SOLN
INTRAMUSCULAR | Status: DC | PRN
  Administered 2024-05-31 (×2): 100 ug via INTRAVENOUS
  Administered 2024-05-31: 200 ug via INTRAVENOUS
  Administered 2024-05-31 (×2): 50 ug via INTRAVENOUS

## 2024-05-31 MED ORDER — DEXMEDETOMIDINE HCL IN NACL 400 MCG/100ML IV SOLN: 0.0000 ug/kg/h | INTRAVENOUS | Status: DC

## 2024-05-31 MED ORDER — ONDANSETRON HCL 4 MG/2ML IJ SOLN
INTRAMUSCULAR | Status: DC | PRN
  Administered 2024-05-31: 8 mg via INTRAVENOUS

## 2024-05-31 MED ORDER — LIDOCAINE 2% (20 MG/ML) 5 ML SYRINGE
INTRAMUSCULAR | Status: AC
  Filled 2024-05-31: qty 5

## 2024-05-31 MED ORDER — GABAPENTIN 300 MG PO CAPS
600.0000 mg | ORAL_CAPSULE | Freq: Two times a day (BID) | ORAL | Status: DC
Start: 2024-05-31 — End: 2024-06-02
  Administered 2024-05-31 – 2024-06-01 (×3): 600 mg via ORAL
  Filled 2024-05-31 (×4): qty 2

## 2024-05-31 MED ORDER — TAMSULOSIN HCL 0.4 MG PO CAPS
0.4000 mg | ORAL_CAPSULE | Freq: Two times a day (BID) | ORAL | Status: DC
  Administered 2024-06-01 – 2024-06-05 (×9): 0.4 mg via ORAL
  Filled 2024-05-31 (×9): qty 1

## 2024-05-31 MED ORDER — ROCURONIUM BROMIDE 10 MG/ML (PF) SYRINGE
PREFILLED_SYRINGE | INTRAVENOUS | Status: AC
  Filled 2024-05-31: qty 10

## 2024-05-31 MED ORDER — POTASSIUM CHLORIDE 10 MEQ/50ML IV SOLN: 10.0000 meq | INTRAVENOUS | Status: DC

## 2024-05-31 MED ORDER — ACETAMINOPHEN 160 MG/5ML PO SOLN: 1000.0000 mg | Freq: Four times a day (QID) | ORAL | Status: DC

## 2024-05-31 MED ORDER — CALCIUM CHLORIDE 10 % IV SOLN
INTRAVENOUS | Status: DC | PRN
  Administered 2024-05-31: 200 mg via INTRAVENOUS

## 2024-05-31 MED ORDER — OXYCODONE HCL 5 MG PO TABS
5.0000 mg | ORAL_TABLET | ORAL | Status: DC | PRN
  Administered 2024-05-31 (×2): 5 mg via ORAL
  Administered 2024-06-01 (×3): 10 mg via ORAL
  Administered 2024-06-02 – 2024-06-03 (×3): 5 mg via ORAL
  Filled 2024-05-31: qty 1
  Filled 2024-05-31: qty 2
  Filled 2024-05-31 (×3): qty 1
  Filled 2024-05-31: qty 2
  Filled 2024-05-31: qty 1
  Filled 2024-05-31: qty 2

## 2024-05-31 MED ORDER — ALBUMIN HUMAN 5 % IV SOLN: INTRAVENOUS | Status: DC | PRN

## 2024-05-31 MED ORDER — BISACODYL 10 MG RE SUPP: 10.0000 mg | Freq: Every day | RECTAL | Status: DC

## 2024-05-31 MED ORDER — INSULIN REGULAR(HUMAN) IN NACL 100-0.9 UT/100ML-% IV SOLN: INTRAVENOUS | Status: DC

## 2024-05-31 MED ORDER — PROTAMINE SULFATE 10 MG/ML IV SOLN
INTRAVENOUS | Status: AC
  Filled 2024-05-31: qty 5

## 2024-05-31 MED ORDER — CEFAZOLIN SODIUM-DEXTROSE 2-4 GM/100ML-% IV SOLN
2.0000 g | Freq: Three times a day (TID) | INTRAVENOUS | Status: AC
  Administered 2024-05-31 – 2024-06-02 (×6): 2 g via INTRAVENOUS
  Filled 2024-05-31 (×6): qty 100

## 2024-05-31 MED ORDER — ONDANSETRON HCL 4 MG/2ML IJ SOLN
INTRAMUSCULAR | Status: AC
  Filled 2024-05-31: qty 4

## 2024-05-31 MED ORDER — TRAMADOL HCL 50 MG PO TABS
50.0000 mg | ORAL_TABLET | ORAL | Status: DC | PRN
  Administered 2024-05-31 – 2024-06-01 (×4): 100 mg via ORAL
  Filled 2024-05-31 (×4): qty 2

## 2024-05-31 MED ORDER — 0.9 % SODIUM CHLORIDE (POUR BTL) OPTIME
TOPICAL | Status: DC | PRN
Start: 2024-05-31 — End: 2024-05-31
  Administered 2024-05-31: 1000 mL
  Administered 2024-05-31: 5000 mL

## 2024-05-31 MED ORDER — METHIMAZOLE 10 MG PO TABS
10.0000 mg | ORAL_TABLET | Freq: Every day | ORAL | Status: DC
  Administered 2024-06-01 – 2024-06-05 (×5): 10 mg via ORAL
  Filled 2024-05-31 (×5): qty 1

## 2024-05-31 MED ORDER — SODIUM CHLORIDE (PF) 0.9 % IJ SOLN
OROMUCOSAL | Status: DC | PRN
  Administered 2024-05-31 (×3): 4 mL via TOPICAL

## 2024-05-31 MED ORDER — MIDAZOLAM HCL (PF) 2 MG/2ML IJ SOLN
2.0000 mg | INTRAMUSCULAR | Status: DC | PRN
  Administered 2024-05-31: 2 mg via INTRAVENOUS
  Filled 2024-05-31 (×2): qty 2

## 2024-05-31 MED ORDER — ASPIRIN 81 MG PO CHEW
324.0000 mg | CHEWABLE_TABLET | Freq: Once | ORAL | Status: AC
  Administered 2024-05-31: 324 mg via ORAL
  Filled 2024-05-31: qty 4

## 2024-05-31 MED ORDER — FENTANYL CITRATE (PF) 250 MCG/5ML IJ SOLN
INTRAMUSCULAR | Status: AC
  Filled 2024-05-31: qty 5

## 2024-05-31 MED ORDER — ACETAMINOPHEN 500 MG PO TABS
1000.0000 mg | ORAL_TABLET | Freq: Four times a day (QID) | ORAL | Status: DC
  Administered 2024-06-01 – 2024-06-05 (×17): 1000 mg via ORAL
  Filled 2024-05-31 (×19): qty 2

## 2024-05-31 MED ORDER — MIDAZOLAM HCL (PF) 2 MG/2ML IJ SOLN
INTRAMUSCULAR | Status: DC | PRN
  Administered 2024-05-31: 1 mg via INTRAVENOUS
  Administered 2024-05-31: 2 mg via INTRAVENOUS

## 2024-05-31 MED ORDER — HEPARIN SODIUM (PORCINE) 1000 UNIT/ML IJ SOLN
INTRAMUSCULAR | Status: DC | PRN
  Administered 2024-05-31: 2000 [IU] via INTRAVENOUS
  Administered 2024-05-31: 28000 [IU] via INTRAVENOUS
  Administered 2024-05-31: 10000 [IU] via INTRAVENOUS
  Administered 2024-05-31: 5000 [IU] via INTRAVENOUS

## 2024-05-31 MED ORDER — VANCOMYCIN HCL IN DEXTROSE 1-5 GM/200ML-% IV SOLN
1000.0000 mg | Freq: Once | INTRAVENOUS | Status: AC
  Administered 2024-05-31: 1000 mg via INTRAVENOUS
  Filled 2024-05-31: qty 200

## 2024-05-31 MED ORDER — SODIUM CHLORIDE 0.9 % IV SOLN: INTRAVENOUS | Status: AC

## 2024-05-31 MED ORDER — PROPOFOL 10 MG/ML IV BOLUS
INTRAVENOUS | Status: DC | PRN
  Administered 2024-05-31: 20 mg via INTRAVENOUS
  Administered 2024-05-31: 60 mg via INTRAVENOUS

## 2024-05-31 MED ORDER — SODIUM CHLORIDE 0.9% FLUSH: 3.0000 mL | INTRAVENOUS | Status: DC | PRN

## 2024-05-31 MED ORDER — ONDANSETRON HCL 4 MG/2ML IJ SOLN: 4.0000 mg | Freq: Four times a day (QID) | INTRAMUSCULAR | Status: DC | PRN

## 2024-05-31 MED ORDER — SODIUM CHLORIDE 0.9 % IV SOLN: 250.0000 mL | INTRAVENOUS | Status: AC

## 2024-05-31 MED ORDER — METOPROLOL TARTRATE 5 MG/5ML IV SOLN: 2.5000 mg | INTRAVENOUS | Status: DC | PRN

## 2024-05-31 MED ORDER — METOPROLOL TARTRATE 12.5 MG HALF TABLET
12.5000 mg | ORAL_TABLET | Freq: Two times a day (BID) | ORAL | Status: DC
  Administered 2024-06-01 – 2024-06-02 (×3): 12.5 mg via ORAL
  Filled 2024-05-31 (×3): qty 1

## 2024-05-31 MED ORDER — PLASMA-LYTE A IV SOLN
INTRAVENOUS | Status: DC | PRN
  Administered 2024-05-31: 500 mL via INTRAVASCULAR

## 2024-05-31 MED ORDER — MORPHINE SULFATE (PF) 2 MG/ML IV SOLN
1.0000 mg | INTRAVENOUS | Status: DC | PRN
  Administered 2024-05-31 (×2): 2 mg via INTRAVENOUS
  Filled 2024-05-31 (×3): qty 1

## 2024-05-31 MED ORDER — SUGAMMADEX SODIUM 200 MG/2ML IV SOLN
INTRAVENOUS | Status: DC | PRN
  Administered 2024-05-31: 147 mg via INTRAVENOUS

## 2024-05-31 MED ORDER — PANTOPRAZOLE SODIUM 40 MG IV SOLR
40.0000 mg | Freq: Every day | INTRAVENOUS | Status: AC
  Administered 2024-05-31 – 2024-06-01 (×2): 40 mg via INTRAVENOUS
  Filled 2024-05-31 (×2): qty 10

## 2024-05-31 MED ORDER — HEMOSTATIC AGENTS (NO CHARGE) OPTIME
TOPICAL | Status: DC | PRN
  Administered 2024-05-31: 1 via TOPICAL

## 2024-05-31 MED ORDER — BISACODYL 5 MG PO TBEC
10.0000 mg | DELAYED_RELEASE_TABLET | Freq: Every day | ORAL | Status: DC
  Administered 2024-06-01 – 2024-06-05 (×5): 10 mg via ORAL
  Filled 2024-05-31 (×5): qty 2

## 2024-05-31 MED ORDER — SODIUM CHLORIDE 0.9 % IV SOLN: INTRAVENOUS | Status: DC | PRN

## 2024-05-31 SURGICAL SUPPLY — 87 items
ADAPTER MULTI PERFUSION 15 (ADAPTER) ×3 IMPLANT
BAG DECANTER FOR FLEXI CONT (MISCELLANEOUS) ×3 IMPLANT
BLADE CLIPPER SURG (BLADE) ×3 IMPLANT
BLADE STERNUM SYSTEM 6 (BLADE) ×3 IMPLANT
BLADE SURG 11 STRL SS (BLADE) IMPLANT
BNDG ELASTIC 4INX 5YD STR LF (GAUZE/BANDAGES/DRESSINGS) IMPLANT
BNDG ELASTIC 4X5.8 VLCR STR LF (GAUZE/BANDAGES/DRESSINGS) ×3 IMPLANT
BNDG ELASTIC 6INX 5YD STR LF (GAUZE/BANDAGES/DRESSINGS) ×3 IMPLANT
BNDG GAUZE DERMACEA FLUFF 4 (GAUZE/BANDAGES/DRESSINGS) ×3 IMPLANT
CANISTER SUCTION 3000ML PPV (SUCTIONS) ×3 IMPLANT
CANNULA AORTIC ROOT 9FR (CANNULA) ×3 IMPLANT
CANNULA EZ GLIDE AORTIC 21FR (CANNULA) ×3 IMPLANT
CANNULA MC2 2 STG 36/46 CONN (CANNULA) IMPLANT
CANNULA VESSEL 3MM BLUNT TIP (CANNULA) ×9 IMPLANT
CATH ROBINSON RED A/P 18FR (CATHETERS) ×3 IMPLANT
CATH THORACIC 36FR (CATHETERS) ×3 IMPLANT
CATH THORACIC 36FR RT ANG (CATHETERS) ×3 IMPLANT
CLIP FOGARTY SPRING 6M (CLIP) IMPLANT
CLIP TI MEDIUM 24 (CLIP) IMPLANT
CLIP TI WIDE RED SMALL 24 (CLIP) IMPLANT
CONTAINER PROTECT SURGISLUSH (MISCELLANEOUS) ×6 IMPLANT
DERMABOND ADVANCED .7 DNX12 (GAUZE/BANDAGES/DRESSINGS) IMPLANT
DRAPE SRG 135X102X78XABS (DRAPES) ×3 IMPLANT
DRAPE WARM FLUID 44X44 (DRAPES) ×3 IMPLANT
DRSG COVADERM 4X14 (GAUZE/BANDAGES/DRESSINGS) ×3 IMPLANT
ELECTRODE REM PT RTRN 9FT ADLT (ELECTROSURGICAL) ×6 IMPLANT
FELT TEFLON 1X6 (MISCELLANEOUS) ×3 IMPLANT
GAUZE SPONGE 4X4 12PLY STRL (GAUZE/BANDAGES/DRESSINGS) ×6 IMPLANT
GAUZE SPONGE 4X4 12PLY STRL LF (GAUZE/BANDAGES/DRESSINGS) IMPLANT
GLOVE BIO SURGEON STRL SZ7 (GLOVE) IMPLANT
GLOVE BIOGEL PI IND STRL 7.0 (GLOVE) IMPLANT
GLOVE SS BIOGEL STRL SZ 6 (GLOVE) IMPLANT
GLOVE SS BIOGEL STRL SZ 7.5 (GLOVE) ×3 IMPLANT
GOWN STRL REUS W/ TWL LRG LVL3 (GOWN DISPOSABLE) ×12 IMPLANT
GOWN STRL REUS W/ TWL XL LVL3 (GOWN DISPOSABLE) ×6 IMPLANT
GOWN STRL SURGICAL XL XLNG (GOWN DISPOSABLE) IMPLANT
HEMOSTAT POWDER SURGIFOAM 1G (HEMOSTASIS) ×9 IMPLANT
HEMOSTAT SURGICEL 2X14 (HEMOSTASIS) ×3 IMPLANT
INSERT FOGARTY XLG (MISCELLANEOUS) IMPLANT
KIT BASIN OR (CUSTOM PROCEDURE TRAY) ×3 IMPLANT
KIT SUCTION CATH 14FR (SUCTIONS) ×6 IMPLANT
KIT TURNOVER KIT B (KITS) ×3 IMPLANT
KIT VASOVIEW HEMOPRO 2 VH 4000 (KITS) ×3 IMPLANT
MARKER GRAFT CORONARY BYPASS (MISCELLANEOUS) ×9 IMPLANT
PACK E OPEN HEART (SUTURE) ×3 IMPLANT
PACK OPEN HEART (CUSTOM PROCEDURE TRAY) ×3 IMPLANT
PAD ARMBOARD POSITIONER FOAM (MISCELLANEOUS) ×6 IMPLANT
PAD ELECT DEFIB RADIOL ZOLL (MISCELLANEOUS) ×3 IMPLANT
PENCIL BUTTON HOLSTER BLD 10FT (ELECTRODE) ×3 IMPLANT
POSITIONER HEAD DONUT 9IN (MISCELLANEOUS) ×3 IMPLANT
PUNCH AORTIC ROTATE 4.0MM (MISCELLANEOUS) IMPLANT
PUNCH AORTIC ROTATE 4.5MM 8IN (MISCELLANEOUS) IMPLANT
PUNCH AORTIC ROTATE 5MM 8IN (MISCELLANEOUS) IMPLANT
SET MPS 3-ND DEL (MISCELLANEOUS) IMPLANT
SOLN 0.9% NACL POUR BTL 1000ML (IV SOLUTION) ×15 IMPLANT
SOLN STERILE WATER BTL 1000 ML (IV SOLUTION) ×6 IMPLANT
SOLUTION ANTFG W/FOAM PAD STRL (MISCELLANEOUS) IMPLANT
SPONGE T-LAP 18X18 ~~LOC~~+RFID (SPONGE) IMPLANT
SPONGE T-LAP 4X18 ~~LOC~~+RFID (SPONGE) IMPLANT
SUPPORT HEART JANKE-BARRON (MISCELLANEOUS) ×3 IMPLANT
SUT BONE WAX W31G (SUTURE) ×3 IMPLANT
SUT MNCRL AB 4-0 PS2 18 (SUTURE) IMPLANT
SUT PROLENE 3 0 SH DA (SUTURE) ×3 IMPLANT
SUT PROLENE 4 0 SH DA (SUTURE) IMPLANT
SUT PROLENE 4-0 RB1 .5 CRCL 36 (SUTURE) IMPLANT
SUT PROLENE 5 0 C 1 36 (SUTURE) IMPLANT
SUT PROLENE 6 0 C 1 30 (SUTURE) ×6 IMPLANT
SUT PROLENE 7 0 BV 1 (SUTURE) IMPLANT
SUT PROLENE 7 0 BV1 MDA (SUTURE) ×3 IMPLANT
SUT PROLENE 8 0 BV175 6 (SUTURE) IMPLANT
SUT STEEL 6MS V (SUTURE) ×3 IMPLANT
SUT STEEL STERNAL CCS#1 18IN (SUTURE) IMPLANT
SUT STEEL SZ 6 DBL 3X14 BALL (SUTURE) ×3 IMPLANT
SUT VIC AB 1 CTX36XBRD ANBCTR (SUTURE) ×6 IMPLANT
SUT VIC AB 2-0 CT1 TAPERPNT 27 (SUTURE) IMPLANT
SUT VIC AB 2-0 CTX 27 (SUTURE) IMPLANT
SUT VIC AB 3-0 SH 27X BRD (SUTURE) IMPLANT
SYSTEM SAHARA CHEST DRAIN ATS (WOUND CARE) ×3 IMPLANT
TAPE CLOTH 4X10 WHT NS (GAUZE/BANDAGES/DRESSINGS) IMPLANT
TAPE PAPER 2X10 WHT MICROPORE (GAUZE/BANDAGES/DRESSINGS) IMPLANT
TOWEL GREEN STERILE (TOWEL DISPOSABLE) ×3 IMPLANT
TOWEL GREEN STERILE FF (TOWEL DISPOSABLE) ×3 IMPLANT
TRAY FOLEY SLVR 16FR TEMP STAT (SET/KITS/TRAYS/PACK) ×3 IMPLANT
TUBE SUCT INTRACARD DLP 20F (MISCELLANEOUS) ×3 IMPLANT
TUBE SUCTION CARDIAC 10FR (CANNULA) ×3 IMPLANT
TUBING LAP HI FLOW INSUFFLATIO (TUBING) ×3 IMPLANT
UNDERPAD 30X36 HEAVY ABSORB (UNDERPADS AND DIAPERS) ×3 IMPLANT

## 2024-05-31 NOTE — Anesthesia Procedure Notes (Addendum)
 Arterial Line Insertion Start/End10/23/2025 6:48 AM, 05/31/2024 6:52 AM Performed by: Zelphia Norleen HERO, CRNA, CRNA  Patient location: Pre-op. Preanesthetic checklist: patient identified, IV checked, site marked, risks and benefits discussed, surgical consent, monitors and equipment checked, pre-op evaluation, timeout performed and anesthesia consent Lidocaine  1% used for infiltration Left, radial was placed Catheter size: 20 G Hand hygiene performed , maximum sterile barriers used  and Seldinger technique used  Attempts: 1 Procedure performed using ultrasound to evaluate access site. Following insertion, dressing applied. Post procedure assessment: normal and unchanged

## 2024-05-31 NOTE — Plan of Care (Signed)
   Problem: Activity: Goal: Risk for activity intolerance will decrease Outcome: Progressing

## 2024-05-31 NOTE — Procedures (Signed)
 Extubation Procedure Note  Patient Details:   Name: ARIS EVEN DOB: 1951/04/05 MRN: [id number]   Airway Documentation:    Vent end date: 05/31/24 Vent end time: 1735   Evaluation  O2 sats: stable throughout Complications: No apparent complications Patient did tolerate procedure well. Bilateral Breath Sounds: Clear, Diminished   Yes  RT extubated patient per rapid wean protocol with RN at bedside. Positive cuff leak noted. NIF -34 and VC 1197. Patient tolerating [place] well at this time, no distress or stridor noted.   Paulla ONEIDA Gaskins 05/31/2024, 5:47 PM

## 2024-05-31 NOTE — Anesthesia Procedure Notes (Signed)
 Central Venous Catheter Insertion Performed by: Darlyn Rush, MD, anesthesiologist Start/End10/23/2025 7:05 AM, 05/31/2024 7:20 AM Patient location: Pre-op. Preanesthetic checklist: patient identified, IV checked, site marked, risks and benefits discussed, surgical consent, monitors and equipment checked, pre-op evaluation, timeout performed and anesthesia consent Position: Trendelenburg Lidocaine  1% used for infiltration and patient sedated Hand hygiene performed  and maximum sterile barriers used  Catheter size: 8.5 Fr Sheath introducer Procedure performed using ultrasound to evaluate access site. Ultrasound Notes:relevant anatomy identified, ultrasound used to visualize needle entry, vessel patent under ultrasound and image(s) printed for medical record. Attempts: 1 Following insertion, line sutured, dressing applied and Biopatch. Post procedure assessment: blood return through all ports, free fluid flow and no air  Patient tolerated the procedure well with no immediate complications.

## 2024-05-31 NOTE — Anesthesia Postprocedure Evaluation (Signed)
 Anesthesia Post Note  Patient: DALLEN BUNTE  Procedure(s) Performed: CORONARY ARTERY BYPASS GRAFTING (CABG) TIMES FOUR , USING LEFT INTERNAL MAMMARY ARTERY AND GREATER SAPHENOUS VEIN HARVESTED ENDOSCOPICALLY (Chest) ECHOCARDIOGRAM, TRANSESOPHAGEAL, INTRAOPERATIVE     Patient location during evaluation: SICU Anesthesia Type: General Level of consciousness: sedated and patient remains intubated per anesthesia plan Pain management: pain level controlled Vital Signs Assessment: post-procedure vital signs reviewed and stable Respiratory status: patient on ventilator - see flowsheet for VS and patient remains intubated per anesthesia plan Cardiovascular status: stable Anesthetic complications: no   No notable events documented.  Last Vitals:  Vitals:   05/31/24 1415 05/31/24 1500  BP:    Pulse: 70 70  Resp: 16 16  Temp: (!) 35.7 C (!) 35.8 C  SpO2: 100% 100%    Last Pain:  Vitals:   05/31/24 1400  TempSrc: Core  PainSc: 2                  Norleen Pope

## 2024-05-31 NOTE — Telephone Encounter (Signed)
 Copied from CRM [id number]. Topic: General - Other >> May 30, 2024  5:01 PM Dedra B wrote: Reason for CRM: Pt wanted to let let Dr. Alvan know that he is having open heart surgery tomorrow and would like a call if possible.

## 2024-05-31 NOTE — Anesthesia Procedure Notes (Signed)
 Central Venous Catheter Insertion Performed by: Darlyn Rush, MD, anesthesiologist Start/End10/23/2025 7:20 AM, 05/31/2024 7:28 AM Patient location: Pre-op. Preanesthetic checklist: patient identified, IV checked, site marked, risks and benefits discussed, surgical consent, monitors and equipment checked, pre-op evaluation, timeout performed and anesthesia consent Hand hygiene performed  and maximum sterile barriers used  PA cath was placed.Swan type:thermodilution Attempts: 1 Post procedure assessment: free fluid flow and no air  Patient tolerated the procedure well with no immediate complications.

## 2024-05-31 NOTE — Anesthesia Procedure Notes (Signed)
 Procedure Name: Intubation Date/Time: 05/31/2024 8:16 AM  Performed by: Zelphia Norleen HERO, CRNAPre-anesthesia Checklist: Patient identified, Emergency Drugs available, Suction available and Patient being monitored Patient Re-evaluated:Patient Re-evaluated prior to induction Oxygen Delivery Method: Circle system utilized Preoxygenation: Pre-oxygenation with 100% oxygen Induction Type: IV induction Ventilation: Mask ventilation without difficulty Laryngoscope Size: Mac and 4 Grade View: Grade II Tube type: Oral Number of attempts: 1 Airway Equipment and Method: Stylet and Oral airway Placement Confirmation: ETT inserted through vocal cords under direct vision, positive ETCO2 and breath sounds checked- equal and bilateral Secured at: 25 cm Tube secured with: Tape Dental Injury: Teeth and Oropharynx as per pre-operative assessment

## 2024-05-31 NOTE — Progress Notes (Signed)
  TCTS Evening Rounds:   Hemodynamically stable, sinus 70's CI = 2.3  Has started to wake up on vent.   Urine output good  CT output low  CBC    Component Value Date/Time   WBC 12.7 (H) 05/31/2024 1312   RBC 2.77 (L) 05/31/2024 1312   HGB 8.5 (L) 05/31/2024 1312   HGB 13.1 05/08/2024 1402   HCT 25.8 (L) 05/31/2024 1312   HCT 39.4 05/08/2024 1402   PLT 168 05/31/2024 1312   PLT 361 05/08/2024 1402   MCV 93.1 05/31/2024 1312   MCV 93 05/08/2024 1402   MCH 30.7 05/31/2024 1312   MCHC 32.9 05/31/2024 1312   RDW 13.5 05/31/2024 1312   RDW 13.1 05/08/2024 1402   LYMPHSABS 1.5 05/08/2024 1402   MONOABS 1,551 (H) 07/20/2016 1100   EOSABS 0.2 05/08/2024 1402   BASOSABS 0.0 05/08/2024 1402     BMET    Component Value Date/Time   NA 140 05/31/2024 1210   NA 138 05/24/2024 1625   K 5.5 (H) 05/31/2024 1210   CL 107 05/31/2024 1210   CO2 22 05/31/2024 0415   GLUCOSE 123 (H) 05/31/2024 1210   BUN 19 05/31/2024 1210   BUN 22 05/24/2024 1625   CREATININE 1.50 (H) 05/31/2024 1210   CREATININE 1.50 (H) 02/01/2023 1116   CALCIUM  8.2 (L) 05/31/2024 0415   EGFR 34 (L) 05/24/2024 1625   GFRNONAA 43 (L) 05/31/2024 0415   GFRNONAA 51 (L) 10/14/2020 0000     A/P:  Stable postop course. Continue current plans. Wean to extubate as tolerated.

## 2024-05-31 NOTE — Op Note (Signed)
 NAMEHOWELL, Victor Price MEDICAL RECORD NO: [id number] ACCOUNT NO: [id number] DATE OF BIRTH: 1951/07/05 FACILITY: [facility] LOCATION: [facility] PHYSICIAN: Elspeth BROCKS. Kerrin, MD  Operative Report   DATE OF PROCEDURE: 05/31/2024  PREOPERATIVE DIAGNOSIS: Three-vessel coronary artery disease status post non-ST elevation MI.  POSTOPERATIVE DIAGNOSIS: Three-vessel coronary artery disease status post non-ST elevation MI.  PROCEDURE:  Median sternotomy, extracorporeal circulation,  Coronary artery bypass grafting x4  Left internal mammary artery to LAD,  Saphenous vein graft to first diagonal,  Saphenous vein graft to obtuse marginal 1,  Saphenous vein graft to posterior descending.  Endoscopic vein harvest right leg.  SURGEON: Elspeth BROCKS. Kerrin, MD  ASSISTANT: Rocky Shad, PA.  Experienced assistance was necessary for this case due to surgical complexity. Erin Barrett independently harvested the saphenous vein endoscopically and then closed the leg incisions. She assisted with retraction of delicate tissues, exposure, suctioning, and suture management during the anastomosis.  ANESTHESIA: General.  FINDINGS: Posterior descending diffusely diseased, fair quality. Remaining targets good quality. Good quality conduits. Preserved left ventricular function by echocardiogram with no significant valvular pathology.  CLINICAL NOTE: Victor Price is a 73 year old man with multiple cardiac risk factors who presented with a several-day history of chest pain. He ultimately ruled in for a non-ST elevation MI, and at catheterization had severe three-vessel coronary artery disease. He was offered coronary artery bypass grafting. The indications, risks, benefits, and alternatives were discussed in detail with the patient. He understood and accepted the risks and agreed to proceed.  OPERATIVE NOTE: Victor Price was brought to the preoperative holding area on 05/31/2024. The anesthesiology service placed a [employer]-Ganz  catheter and an arterial blood pressure monitoring line. He was taken to the operating room, anesthetized, and intubated. A Foley catheter was placed. Intravenous antibiotics were administered. Transesophageal echocardiography was performed by Dr. Darlyn. Please see his separately dictated note for full details of the procedure. The chest, abdomen, and legs were prepped and draped in the usual sterile fashion.  A time-out was performed. An incision was made in the medial aspect of the right leg at the level of the knee. The greater saphenous vein was harvested from the upper calf to the groin endoscopically. Simultaneously, a median sternotomy was performed and the left internal mammary artery was harvested using standard technique. 2000 units of heparin was administered during the vessel harvest. The remainder of the full heparin dose was given prior to opening the pericardium. Both the saphenous vein and mammary artery were good quality.  After harvesting the conduits, the sternal retractor was placed and was gradually opened. There was hyperinflation of the lungs. The pericardium was opened. The remainder of the full heparin dose was given. The ascending aorta was inspected. There was no evidence of atherosclerotic disease. After confirming adequate anticoagulation with ACT measurement, the aorta was cannulated via concentric 2-0 Ethibond pledgeted pursestring sutures. A dual-stage venous cannula was placed via a pursestring suture in the right atrial appendage. Cardiopulmonary bypass was initiated. Flows were maintained per protocol. The patient was cooled to 32 degrees Celsius.  The coronary arteries were inspected. Anastomotic sites were chosen. The conduits were inspected and prepared. A foam pad was placed in the pericardium to insulate the heart. A temperature probe was placed in the myocardial septum and a cardioplegia cannula was placed in the ascending aorta.  The aorta was cross-clamped. The  left ventricle was emptied via the aortic root vent. Cardiac arrest then was achieved with a combination of cold antegrade blood cardioplegia and topical ice  saline. One liter of cardioplegia was administered. There was a rapid diastolic arrest and was septal cooling to 13 degrees Celsius.  A reversed saphenous vein graft was placed end-to-side to the posterior descending branch of the right coronary. This was more diffusely diseased than appreciated on catheterization. It did have significant plaque both before and after the anastomosis, but a 1.5 mm probe did pass distally. The vein graft was anastomosed end-to-side with a running 7-0 Prolene suture. All anastomoses were probed proximally and distally at their completion. Cardioplegia was administered and there was good flow and good hemostasis. Additional cardioplegia was also administered down the aortic root.  Next, a reversed saphenous vein graft was placed end-to-side to OM1. It was a 1.5-mm good-quality target at the site of the anastomosis. The vein was anastomosed end-to-side with a running 7-0 Prolene suture. A probe passed easily proximally and distally. Cardioplegia was administered with good flow and good hemostasis.  A reversed saphenous vein graft then was placed end-to-side to the first diagonal branch of the LAD. This was a high anterolateral branch. It was a 1.5 mm good quality target. The anastomosis was performed with a running 7-0 Prolene suture. The probe passed easily. Cardioplegia was administered. There was good flow and good hemostasis. Additional cardioplegia was also administered via the aortic root.  The left internal mammary artery was brought through a window in the pericardium. The distal end was beveled. It was then anastomosed end-to-side to the distal LAD. Both the LAD and mammary were 1.5 mm good quality vessels. The anastomosis was performed with a running 8-0 Prolene suture. At the completion of the anastomosis, the  Bulldog clamp was removed. Rapid septal rewarming was noted. The bulldog clamp was replaced and the mammary pedicle was tacked to the epicardial surface of the heart with 6-0 Prolene sutures.  Additional cardioplegia was administered. The vein grafts were cut to length. The cardioplegia cannula was removed from the ascending aorta. The proximal vein graft anastomoses were performed to 4.5-mm punch aortotomies with running 6-0 Prolene sutures. At the completion of the final proximal anastomosis, the patient was placed in a Trendelenburg position. Lidocaine  was administered. The aortic root was de-aired and the aortic cross-clamp was removed. The total cross-clamp time was 59 minutes.  While rewarming was completed, all proximal and distal anastomoses were inspected for hemostasis. Epicardial pacing wires were placed on the right ventricle and right atrium. When the patient had rewarmed to a core temperature of 37 degrees Celsius, he  was weaned from cardiopulmonary bypass on the first attempt. The total bypass time was 85 minutes. He was DDD paced for rate and on no inotropic support at the time of separation from bypass.  A test dose of Protamine was administered and was well tolerated. The atrial and aortic cannulae were removed. The remainder of the protamine was administered without incident. The chest was copiously irrigated with warm saline. Hemostasis was achieved. Post bypass transesophageal echocardiography revealed no change in left ventricular function.  The pericardium was reapproximated over the ascending aorta with interrupted 3-0 silk sutures. Left pleural and mediastinal chest tubes were placed through separate subcostal incisions and secured with #1 silk sutures. The sternum was closed with a combination of single and double heavy-gauge stainless steel wires. The pectoralis fascia and subcutaneous tissue and skin were closed in a standard fashion. All sponge, needle, and instrument counts  were correct at the end of the procedure. The patient remained hemodynamically stable. He was taken from the operating room  to the surgical intensive care unit, intubated and in fair condition.    SUJ D: 05/31/2024 5:45:44 pm T: 05/31/2024 11:14:00 pm  JOB: [id number]/ [id number]

## 2024-05-31 NOTE — Interval H&P Note (Signed)
 History and Physical Interval Note:  05/31/2024 7:39 AM  Victor Price  has presented today for surgery, with the diagnosis of CAD.  The various methods of treatment have been discussed with the patient and family. After consideration of risks, benefits and other options for treatment, the patient has consented to  Procedure(s): CORONARY ARTERY BYPASS GRAFTING (CABG) (N/A) ECHOCARDIOGRAM, TRANSESOPHAGEAL, INTRAOPERATIVE (N/A) as a surgical intervention.  The patient's history has been reviewed, patient examined, no change in status, stable for surgery.  I have reviewed the patient's chart and labs.  Questions were answered to the patient's satisfaction.     [name]

## 2024-05-31 NOTE — Transfer of Care (Signed)
 Immediate Anesthesia Transfer of Care Note  Patient: KAMAAL CAST  Procedure(s) Performed: CORONARY ARTERY BYPASS GRAFTING (CABG) TIMES FOUR , USING LEFT INTERNAL MAMMARY ARTERY AND GREATER SAPHENOUS VEIN HARVESTED ENDOSCOPICALLY (Chest) ECHOCARDIOGRAM, TRANSESOPHAGEAL, INTRAOPERATIVE  Patient Location: ICU  Anesthesia Type:General  Level of Consciousness: sedated and Patient remains intubated per anesthesia plan  Airway & Oxygen Therapy: Patient remains intubated per anesthesia plan  Post-op Assessment: Report given to RN, Post -op Vital signs reviewed and stable, and Post -op Vital signs reviewed and unstable, Anesthesiologist notified  Post vital signs: Reviewed and stable  Last Vitals:  Vitals Value Taken Time  BP    Temp 35.9 C 05/31/24 13:10  Pulse 80 05/31/24 13:10  Resp 16 05/31/24 13:10  SpO2 100 % 05/31/24 13:10  Vitals shown include unfiled device data.  Last Pain:  Vitals:   05/31/24 0637  TempSrc: (P) Oral  PainSc:       Patients Stated Pain Goal: 0 (05/30/24 1857)  Complications: No notable events documented.

## 2024-05-31 NOTE — Hospital Course (Addendum)
 History of Present Illness:  Victor Price is 73 yo male with known history of HTN, CKD Stage 3, Hypothyroidism/Graves Disease, HLD, and BPH. The patient presented to an Urgent Care on 10/17 with complaints of chest pain that he described as someone having knees in his chest. This had been occurring over several days. He denied associated shortness of breath. EKG was obtained and showed NSR that was identical to previous EKG on file. He was discharged, but instructed to report to the ED should his pain worsen. On 10/18 he presented to [facility] Emergency Department with complaints of intermittent chest pain. He complained that episodes had occurred 6 times over the past 3-4 days and lasted about 20 min each time. He again denied shortness of breath, N/V, and lightheadedness. EKG remained normal with minimal elevation in Troponin level to 81. Cardiology was consulted and recommended admission to medicine service.   Hospital Course:  Due to patient's baseline creatinine of 1.95 CTA was not recommended. His troponin levels increased to over 200. Due to this it was recommended he undergo catheterization which was performed 05/29/2024 and showed CAD.  He was evaluated by Dr. Kerrin who was in agreement the patient would benefit from coronary bypass grafting procedure.  The risks and benefits of the procedure were explained to the patient and he was agreeable to proceed.  The patient was taken to the operating room on 05/31/2024.  He underwent Coronary bypass grafting x 4 utilizing LIMA to LAD, SVG to PDA, SVG to OM, and SVG to PDA.  He also underwent endoscopic harvest from greater saphenous vein from his right leg.  He tolerated the procedure without difficulty and was taken to the SICU in stable condition.  The patient was extubated the evening of surgery.  He required some blood pressure support with Neo-synephrine which was weaned as tolerated.  The patient had hyperkalemia and he was started on diuretics.   His post operative EKG showed some ST elevation which was felt to be pericarditis.  He has known CKD Stage 3 with admission creatinine of 1.95.  Transthoracic echo obtained on postop day 1 showed normal left ventricular ejection fraction with mildly reduced RV function. By postop day 2, the creatinine had trended up to 2.8.  He was started on Lasix drip by the advanced heart failure team.  He complained of some visual disturbances prompting head CT scan.  Study was negative for any acute abnormality.  He had good response to the Lasix drip with a net -5 L urine output on postop day 2.  Creatinine stabilized at 2.6.  Diet and activity were advanced routinely.  Chest tubes and monitoring lines were removed on postop day 3.

## 2024-05-31 NOTE — Brief Op Note (Signed)
 05/26/2024 - 05/31/2024  2:52 PM  PATIENT:  Victor Price  73 y.o. male  PRE-OPERATIVE DIAGNOSIS:  CORONARY ARTERY DISEASE  POST-OPERATIVE DIAGNOSIS:  CORONARY ARTERY DISEASE  PROCEDURE:  Procedure(s):  CORONARY ARTERY BYPASS GRAFTING x 4 -LIMA to LAD -SVG to DIAGONAL -SVG to OM -SVG to PDA  ECHOCARDIOGRAM, TRANSESOPHAGEAL, INTRAOPERATIVE (N/A)  ENDOSCOPIC HARVEST GREATER SAPHENOUS VEIN -Right Leg  Vein harvest time: 35 min Vein prep time: 15 min  SURGEON:  Surgeons and Role:    DEWAINE Kerrin Elspeth JAYSON, MD - Primary  PHYSICIAN ASSISTANT: Rocky Shad PA-C  ASSISTANTS: Evalene Domino RNFA   ANESTHESIA:   general  EBL:  450 mL   BLOOD ADMINISTERED:  CC CELLSAVER  DRAINS: Left Pleural Chest Tube, Mediastinal Chest Drain   LOCAL MEDICATIONS USED:  NONE  SPECIMEN:  No Specimen  DISPOSITION OF SPECIMEN:  N/A  COUNTS:  YES  TOURNIQUET:  * No tourniquets in log *  DICTATION: .Dragon Dictation  PLAN OF CARE: Admit to inpatient   PATIENT DISPOSITION:  ICU - intubated and hemodynamically stable.   Delay start of Pharmacological VTE agent (>24hrs) due to surgical blood loss or risk of bleeding: yes

## 2024-06-01 ENCOUNTER — Other Ambulatory Visit: Payer: Self-pay | Admitting: Thoracic Surgery (Cardiothoracic Vascular Surgery)

## 2024-06-01 ENCOUNTER — Inpatient Hospital Stay (HOSPITAL_COMMUNITY)

## 2024-06-01 ENCOUNTER — Telehealth: Payer: Self-pay | Admitting: Family Medicine

## 2024-06-01 ENCOUNTER — Encounter (HOSPITAL_COMMUNITY): Payer: Self-pay | Admitting: Thoracic Surgery (Cardiothoracic Vascular Surgery)

## 2024-06-01 DIAGNOSIS — Z951 Presence of aortocoronary bypass graft: Secondary | ICD-10-CM

## 2024-06-01 DIAGNOSIS — Z48812 Encounter for surgical aftercare following surgery on the circulatory system: Secondary | ICD-10-CM | POA: Diagnosis not present

## 2024-06-01 DIAGNOSIS — I5031 Acute diastolic (congestive) heart failure: Secondary | ICD-10-CM

## 2024-06-01 DIAGNOSIS — I214 Non-ST elevation (NSTEMI) myocardial infarction: Secondary | ICD-10-CM | POA: Diagnosis not present

## 2024-06-01 DIAGNOSIS — J40 Bronchitis, not specified as acute or chronic: Secondary | ICD-10-CM | POA: Diagnosis not present

## 2024-06-01 DIAGNOSIS — I10 Essential (primary) hypertension: Secondary | ICD-10-CM | POA: Diagnosis not present

## 2024-06-01 LAB — CBC
HCT: 26.7 % — ABNORMAL LOW (ref 39.0–52.0)
HCT: 26.4 % — ABNORMAL LOW (ref 39.0–52.0)
Hemoglobin: 8.7 g/dL — ABNORMAL LOW (ref 13.0–17.0)
Hemoglobin: 8.6 g/dL — ABNORMAL LOW (ref 13.0–17.0)
MCH: 30.5 pg (ref 26.0–34.0)
MCH: 31 pg (ref 26.0–34.0)
MCHC: 32.6 g/dL (ref 30.0–36.0)
MCHC: 32.6 g/dL (ref 30.0–36.0)
MCV: 93.7 fL (ref 80.0–100.0)
MCV: 95.3 fL (ref 80.0–100.0)
Platelets: 209 K/uL (ref 150–400)
Platelets: 189 K/uL (ref 150–400)
RBC: 2.85 MIL/uL — ABNORMAL LOW (ref 4.22–5.81)
RBC: 2.77 MIL/uL — ABNORMAL LOW (ref 4.22–5.81)
RDW: 13.8 % (ref 11.5–15.5)
RDW: 14.3 % (ref 11.5–15.5)
WBC: 14.9 K/uL — ABNORMAL HIGH (ref 4.0–10.5)
WBC: 14.1 K/uL — ABNORMAL HIGH (ref 4.0–10.5)
nRBC: 0 % (ref 0.0–0.2)
nRBC: 0 % (ref 0.0–0.2)

## 2024-06-01 LAB — COOXEMETRY PANEL
Carboxyhemoglobin: 1.7 % — ABNORMAL HIGH (ref 0.5–1.5)
Methemoglobin: <0.7 % (ref 0.0–1.5)
O2 Saturation: 68.7 %
Total hemoglobin: 12.8 g/dL (ref 12.0–16.0)

## 2024-06-01 LAB — BASIC METABOLIC PANEL WITH GFR
Anion gap: 8 (ref 5–15)
Anion gap: 10 (ref 5–15)
BUN: 19 mg/dL (ref 8–23)
BUN: 25 mg/dL — ABNORMAL HIGH (ref 8–23)
CO2: 19 mmol/L — ABNORMAL LOW (ref 22–32)
CO2: 19 mmol/L — ABNORMAL LOW (ref 22–32)
Calcium: 7.3 mg/dL — ABNORMAL LOW (ref 8.9–10.3)
Calcium: 7.5 mg/dL — ABNORMAL LOW (ref 8.9–10.3)
Chloride: 105 mmol/L (ref 98–111)
Chloride: 102 mmol/L (ref 98–111)
Creatinine, Ser: 1.79 mg/dL — ABNORMAL HIGH (ref 0.61–1.24)
Creatinine, Ser: 2.39 mg/dL — ABNORMAL HIGH (ref 0.61–1.24)
GFR, Estimated: 40 mL/min — ABNORMAL LOW (ref >60)
GFR, Estimated: 28 mL/min — ABNORMAL LOW (ref >60)
Glucose, Bld: 118 mg/dL — ABNORMAL HIGH (ref 70–99)
Glucose, Bld: 115 mg/dL — ABNORMAL HIGH (ref 70–99)
Potassium: 5.5 mmol/L — ABNORMAL HIGH (ref 3.5–5.1)
Potassium: 4.6 mmol/L (ref 3.5–5.1)
Sodium: 132 mmol/L — ABNORMAL LOW (ref 135–145)
Sodium: 131 mmol/L — ABNORMAL LOW (ref 135–145)

## 2024-06-01 LAB — GLUCOSE, CAPILLARY
Glucose-Capillary: 104 mg/dL — ABNORMAL HIGH (ref 70–99)
Glucose-Capillary: 108 mg/dL — ABNORMAL HIGH (ref 70–99)
Glucose-Capillary: 111 mg/dL — ABNORMAL HIGH (ref 70–99)
Glucose-Capillary: 115 mg/dL — ABNORMAL HIGH (ref 70–99)
Glucose-Capillary: 115 mg/dL — ABNORMAL HIGH (ref 70–99)
Glucose-Capillary: 121 mg/dL — ABNORMAL HIGH (ref 70–99)
Glucose-Capillary: 121 mg/dL — ABNORMAL HIGH (ref 70–99)
Glucose-Capillary: 122 mg/dL — ABNORMAL HIGH (ref 70–99)
Glucose-Capillary: 79 mg/dL (ref 70–99)
Glucose-Capillary: 93 mg/dL (ref 70–99)
Glucose-Capillary: 96 mg/dL (ref 70–99)

## 2024-06-01 LAB — MAGNESIUM
Magnesium: 2.8 mg/dL — ABNORMAL HIGH (ref 1.7–2.4)
Magnesium: 2.7 mg/dL — ABNORMAL HIGH (ref 1.7–2.4)

## 2024-06-01 LAB — ECHOCARDIOGRAM LIMITED
Calc EF: 61.1 %
Height: 72 in
S' Lateral: 2.1 cm
Single Plane A2C EF: 57.8 %
Single Plane A4C EF: 65.4 %
Weight: 3044.11 [oz_av]

## 2024-06-01 MED ORDER — COLCHICINE 0.6 MG PO TABS
0.6000 mg | ORAL_TABLET | Freq: Every day | ORAL | Status: DC
  Administered 2024-06-01 – 2024-06-05 (×5): 0.6 mg via ORAL
  Filled 2024-06-01 (×5): qty 1

## 2024-06-01 MED ORDER — ALBUMIN HUMAN 5 % IV SOLN
12.5000 g | Freq: Once | INTRAVENOUS | Status: AC
  Administered 2024-06-01: 12.5 g via INTRAVENOUS
  Filled 2024-06-01: qty 250

## 2024-06-01 MED ORDER — INSULIN ASPART 100 UNIT/ML IJ SOLN
0.0000 [IU] | Freq: Three times a day (TID) | INTRAMUSCULAR | Status: DC
  Administered 2024-06-01: 1 [IU] via SUBCUTANEOUS

## 2024-06-01 MED ORDER — FUROSEMIDE 10 MG/ML IJ SOLN
20.0000 mg | Freq: Two times a day (BID) | INTRAMUSCULAR | Status: DC
  Administered 2024-06-01: 20 mg via INTRAVENOUS
  Filled 2024-06-01: qty 2

## 2024-06-01 MED ORDER — FUROSEMIDE 10 MG/ML IJ SOLN
INTRAMUSCULAR | Status: AC
  Administered 2024-06-02: 80 mg via INTRAVENOUS
  Filled 2024-06-01: qty 8

## 2024-06-01 MED ORDER — ENOXAPARIN SODIUM 40 MG/0.4ML IJ SOSY
40.0000 mg | PREFILLED_SYRINGE | Freq: Every day | INTRAMUSCULAR | Status: DC
  Administered 2024-06-01: 40 mg via SUBCUTANEOUS
  Filled 2024-06-01: qty 0.4

## 2024-06-01 MED ORDER — FUROSEMIDE 10 MG/ML IJ SOLN
80.0000 mg | Freq: Once | INTRAMUSCULAR | Status: AC
  Administered 2024-06-01: 80 mg via INTRAVENOUS

## 2024-06-01 MED ORDER — CHLORHEXIDINE GLUCONATE CLOTH 2 % EX PADS
6.0000 | MEDICATED_PAD | Freq: Every day | CUTANEOUS | Status: DC
  Administered 2024-06-01 – 2024-06-05 (×5): 6 via TOPICAL

## 2024-06-01 MED FILL — Sodium Bicarbonate IV Soln 8.4%: INTRAVENOUS | Qty: 50 | Status: AC

## 2024-06-01 MED FILL — henylephrine-NaCl Pref Syr 0.8 MG/10ML-0.9% (80 MCG/ML): INTRAVENOUS | Qty: 50 | Status: AC

## 2024-06-01 MED FILL — Sodium Chloride IV Soln 0.9%: INTRAVENOUS | Qty: 2000 | Status: AC

## 2024-06-01 MED FILL — Lidocaine HCl Local Soln Prefilled Syringe 100 MG/5ML (2%): INTRAMUSCULAR | Qty: 5 | Status: AC

## 2024-06-01 MED FILL — Electrolyte-R (PH 7.4) Solution: INTRAVENOUS | Qty: 4000 | Status: AC

## 2024-06-01 MED FILL — Mannitol IV Soln 20%: INTRAVENOUS | Qty: 500 | Status: AC

## 2024-06-01 MED FILL — Heparin Sodium (Porcine) Inj 1000 Unit/ML: INTRAMUSCULAR | Qty: 60 | Status: AC

## 2024-06-01 MED FILL — Heparin Sodium (Porcine) Inj 1000 Unit/ML: INTRAMUSCULAR | Qty: 20 | Status: AC

## 2024-06-01 NOTE — Consult Note (Addendum)
 Advanced Heart Failure Team Consult Note   Primary Physician: [name], MD HF Consulting Cardiologist: Dr. Cherrie Reason for Consultation: Pericardial Rub HPI:    Victor Price is seen today for evaluation of pericardial rub at the request of Dr. Kerrin.   TAHMIR KLECKNER is a 73 y.o. male with history of CKD 3b, HTN, BPH, ED, DDD, bronchiectasis, and hyperthyroidism.  He presented to [facility] ED 05/26/24 with chest pain. Had been intermittent for prior 4 to 5 days. Vitals in ED notbal efor HR 60-80s, SBP 130-150s. Labs notable for Cr 1.95, trop 81>80, otherwise unremarkable. CXR without acute findings. Cardiology was consulted. Initial concern was for coronary spasm. Echo showed normal EF with G1DD, normal RV. He again developed 10/10 chest pain on 10/19, trop 222, he was started on diltiazem, heparin gtt and taken for [facility] 10/20 showing severe multivessel disease with culprit lesion being distal RCA. TCTS consulted.   He was taken for CABG X4 (LIMA-LAD, SVG-diag, SVG-OM, SVG-PDA) on 05/31/24. Extubated. Noted on TCTS auscultation to have pericardial friction rub. HF consulted.   Today, appears to bein mild respiratory distress on exam with tachypnea and rales. JVP to jaw. Reports shortness of breath and post surgical pain.   Home Medications Prior to Admission medications   Medication Sig Start Date End Date Taking? Authorizing Provider  bisoprolol -hydrochlorothiazide  (ZIAC ) 10-6.25 MG tablet TAKE 1 TABLET BY MOUTH EVERY DAY 05/25/24  Yes [name], MD  [facility]  RELIEF-D 10-240 MG 24 hr tablet TAKE 1 TABLET BY MOUTH EVERY DAY 07/29/23  Yes [name], MD  FARXIGA  10 MG TABS tablet TAKE 1 TABLET BY MOUTH EVERY DAY 11/17/23  Yes [name], MD  fluticasone  (FLONASE ) 50 MCG/ACT nasal spray Place 2 sprays into both nostrils daily. Patient taking differently: Place 2 sprays into both nostrils daily as needed for allergies. 01/28/24  Yes  McLean, Darius, PA-C  gabapentin  (NEURONTIN ) 600 MG tablet Take 1 tablet (600 mg total) by mouth 2 (two) times daily. Patient taking differently: Take 300 mg by mouth 2 (two) times daily. 02/28/24  Yes [name], MD  ipratropium (ATROVENT ) 0.03 % nasal spray Place 2 sprays into both nostrils 2 (two) times daily as needed for rhinitis. For runny nose Patient taking differently: Place 2 sprays into both nostrils daily as needed for rhinitis (runny nose). 01/06/24  Yes [name], MD  methimazole  (TAPAZOLE ) 10 MG tablet TAKE 1 TABLET BY MOUTH TWICE A DAY Patient taking differently: Take 10 mg by mouth daily. 01/18/24  Yes [name], MD  sildenafil  (REVATIO ) 20 MG tablet TAKE 2 TO 5 TABLETS BY MOUTH DAILY AS NEEDED Patient taking differently: Take 20 mg by mouth daily. 05/08/24  Yes [name], MD  tamsulosin  (FLOMAX ) 0.4 MG CAPS capsule TAKE 1 CAPSULE (0.4 MG TOTAL) BY MOUTH IN THE MORNING AND AT BEDTIME 11/17/23  Yes [name], MD  traZODone  (DESYREL ) 50 MG tablet TAKE 1/2 TO 2 TABLETS BY MOUTH AT BEDTIME AS NEEDED SLEEP Patient taking differently: Take 100 mg by mouth at bedtime. 02/13/24  Yes [name], MD  triamcinolone  cream (KENALOG ) 0.1 % Apply 1 Application topically 2 (two) times daily as needed (Rash). 10/19/21  Yes [provider]  umeclidinium-vilanterol (ANORO ELLIPTA ) 62.5-25 MCG/ACT AEPB Inhale 1 puff into the lungs daily. 12/20/23  Yes [name], MD  Testosterone  12.5 MG/ACT (1%) GEL APPLY 1 PUMP TO AN INNER THIGH AND 1 PUMPS TO  OPPOSITE INNER THIGH IN AM 05/28/24   [name], MD    Past Medical History: Past Medical History:  Diagnosis Date   BPH (benign prostatic hyperplasia)    Bronchiectasis with acute lower respiratory infection (HCC) 08/13/2019   Bronchitis    inhaler prn   Cancer (HCC)    skin cancer?   CKD (chronic kidney disease) stage 3, GFR 30-59 ml/min (HCC)    ED  (erectile dysfunction)    GERD (gastroesophageal reflux disease)    HLD (hyperlipidemia)    Hypertension    Hyperthyroidism    Multinodular thyroid     Pneumonia    gets recurrent PNA in left lung, recent dx 08/13/19 left lower lobe   Seasonal allergies     Past Surgical History: Past Surgical History:  Procedure Laterality Date   BACK SURGERY  1980s   [facility]   COLONOSCOPY  2004, 2012   several - polyps   CORONARY ARTERY BYPASS GRAFT N/A 05/31/2024   Procedure: CORONARY ARTERY BYPASS GRAFTING (CABG) TIMES FOUR , USING LEFT INTERNAL MAMMARY ARTERY AND GREATER SAPHENOUS VEIN HARVESTED ENDOSCOPICALLY;  Surgeon: Kerrin Elspeth BROCKS, MD;  Location: [facility] OR;  Service: Open Heart Surgery;  Laterality: N/A;   CORONARY PRESSURE/FFR WITH 3D MAPPING N/A 05/29/2024   Procedure: Coronary Pressure/FFR w/3D Mapping;  Surgeon: Anner Alm ORN, MD;  Location: [facility] INVASIVE CV LAB;  Service: Cardiovascular;  Laterality: N/A;   INTRAOPERATIVE TRANSESOPHAGEAL ECHOCARDIOGRAM N/A 05/31/2024   Procedure: ECHOCARDIOGRAM, TRANSESOPHAGEAL, INTRAOPERATIVE;  Surgeon: Kerrin Elspeth BROCKS, MD;  Location: [facility] OR;  Service: Open Heart Surgery;  Laterality: N/A;   LEFT HEART CATH AND CORONARY ANGIOGRAPHY N/A 05/29/2024   Procedure: LEFT HEART CATH AND CORONARY ANGIOGRAPHY;  Surgeon: Anner Alm ORN, MD;  Location: [facility] INVASIVE CV LAB;  Service: Cardiovascular;  Laterality: N/A;   MULTIPLE TOOTH EXTRACTIONS     TOTAL HIP ARTHROPLASTY Left 08/24/2019   Procedure: LEFT TOTAL HIP ARTHROPLASTY-DIRECT ANTERIOR;  Surgeon: Barbarann Oneil BROCKS, MD;  Location: [facility] OR;  Service: Orthopedics;  Laterality: Left;   TOTAL HIP ARTHROPLASTY Right 08/26/2023   Procedure: RIGHT TOTAL HIP ARTHROPLASTY;  Surgeon: Barbarann Oneil BROCKS, MD;  Location: [facility] OR;  Service: Orthopedics;  Laterality: Right;  Needs RNFA    Family History: Family History  Problem Relation Age of Onset   Dementia Mother    Healthy Father     Social History: Social History    Socioeconomic History   Marital status: Married    Spouse name: Not on file   Number of children: 0   Years of education: 14   Highest education level: Associate degree: occupational, Scientist, product/process development, or vocational program  Occupational History   Occupation: [employer]    Employer: [employer]    Comment: Retired    Comment: Part-time  Tobacco Use   Smoking status: Never   Smokeless tobacco: Never  Vaping Use   Vaping status: Never Used  Substance and Sexual Activity   Alcohol use: No   Drug use: No   Sexual activity: Not on file  Other Topics Concern   Not on file  Social History Narrative   Lives with his domestic partner. Works part-time. 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      Social Drivers of Health   Financial Resource Strain: Low Risk  (04/13/2024)   Received from [facility]   Overall Financial Resource Strain (CARDIA)    How hard is it for you to pay for the very basics like food, housing, medical  care, and heating?: Not hard at all  Food Insecurity: No Food Insecurity (05/27/2024)   Hunger Vital Sign    Worried About Running Out of Food in the Last Year: Never true    Ran Out of Food in the Last Year: Never true  Transportation Needs: No Transportation Needs (05/27/2024)   PRAPARE - Administrator, Civil Service (Medical): No    Lack of Transportation (Non-Medical): No  Physical Activity: Sufficiently Active (04/13/2024)   Received from [facility]   Exercise Vital Sign    On average, how many days per week do you engage in moderate to strenuous exercise (like a brisk walk)?: 3 days    On average, how many minutes do you engage in exercise at this level?: 60 min  Stress: No Stress Concern Present (04/13/2024)   Received from [facility] - Occupational Stress Questionnaire    Do you feel stress - tense, restless, nervous, or anxious, or unable to sleep at night because your mind is troubled all the  time - these days?: Not at all  Social Connections: Moderately Integrated (05/27/2024)   Social Connection and Isolation Panel    Frequency of Communication with Friends and Family: More than three times a week    Frequency of Social Gatherings with Friends and Family: Twice a week    Attends Religious Services: More than 4 times per year    Active Member of [employer] or Organizations: No    Attends Banker Meetings: Never    Marital Status: Living with partner    Allergies:  Allergies  Allergen Reactions   Losartan  Other (See Comments)    Lightheadedness, dizziness, syncope   Metoprolol  Other (See Comments)    dizziness   Nsaids Other (See Comments)    CKD 3     Objective:    Vital Signs:   Temp:  [96.3 F (35.7 C)-99.1 F (37.3 C)] 98.4 F (36.9 C) (10/24 0815) Pulse Rate:  [66-100] 85 (10/24 1315) Resp:  [7-26] 12 (10/24 1315) BP: (66-130)/(55-117) 108/75 (10/24 0600) SpO2:  [82 %-100 %] 95 % (10/24 1315) Arterial Line BP: (82-142)/(40-69) 125/54 (10/24 1315) FiO2 (%):  [40 %] 40 % (10/23 1704) Weight:  [86.3 kg] 86.3 kg (10/24 0500) Last BM Date : 05/28/24  Weight change: Filed Weights   05/28/24 1104 05/31/24 0637 06/01/24 0500  Weight: 73.5 kg (P) 74.8 kg 86.3 kg   Intake/Output:  Intake/Output Summary (Last 24 hours) at 06/01/2024 1406 Last data filed at 06/01/2024 1000 Gross per 24 hour  Intake 3064.66 ml  Output 1804 ml  Net 1260.66 ml    Physical Exam    General: Appears mildly in distress. Pale Cardiac: JVP to jaw. S1 and S2 present. No rub heard on auscultation per Dr. Cherrie Resp: Rales Extremities: Warm and dry.  2+ edema.  Neuro: Alert and oriented x3. Anxious.  Telemetry   SR 80s (personally reviewed)  Labs   Basic Metabolic Panel: Recent Labs  Lab 05/29/24 0403 05/29/24 1026 05/30/24 0456 05/31/24 0415 05/31/24 0823 05/31/24 1046 05/31/24 1129 05/31/24 1137 05/31/24 1210 05/31/24 1314 05/31/24 1606  05/31/24 1728 05/31/24 1843 05/31/24 1901 06/01/24 0443  NA 138 138 138 138   < > 139 139   < > 140   < > 140 140 138 138 132*  K 4.5 4.3 4.7 3.9   < > 4.9 6.2*   < > 5.5*   < > 5.3* 5.6*  5.6* 5.5* 5.5*  CL 105 108 110 107   < > 106 107  --  107  --   --   --   --  110 105  CO2 24 22 21* 22  --   --   --   --   --   --   --   --   --  20* 19*  GLUCOSE 120* 100* 96 116*   < > 114* 128*  --  123*  --   --   --   --  148* 118*  BUN 33* 28* 21 19   < > 18 18  --  19  --   --   --   --  19 19  CREATININE 2.31* 2.03* 1.80* 1.68*   < > 1.30* 1.50*  --  1.50*  --   --   --   --  1.72* 1.79*  CALCIUM  8.8* 8.7* 8.4* 8.2*  --   --   --   --   --   --   --   --   --  7.6* 7.3*  MG 2.3  --  2.1  --   --   --   --   --   --   --   --   --   --  3.2* 2.8*   < > = values in this interval not displayed.   Liver Function Tests: Recent Labs  Lab 05/26/24 0903  AST 25  ALT 18  ALKPHOS 75  BILITOT 0.4  PROT 7.2  ALBUMIN  4.3   Recent Labs  Lab 05/26/24 0903  LIPASE 30   CBC: Recent Labs  Lab 05/30/24 0456 05/31/24 0415 05/31/24 0823 05/31/24 1132 05/31/24 1137 05/31/24 1312 05/31/24 1314 05/31/24 1606 05/31/24 1728 05/31/24 1843 05/31/24 1901 06/01/24 0443  WBC 5.8 5.3  --   --   --  12.7*  --   --   --   --  14.9* 14.9*  HGB 12.6* 12.1*   < > 7.6*   < > 8.5*   < > 8.2* 8.8* 8.8* 9.6* 8.7*  HCT 38.4* 36.7*   < > 23.4*   < > 25.8*   < > 24.0* 26.0* 26.0* 29.8* 26.7*  MCV 92.3 91.1  --   --   --  93.1  --   --   --   --  94.3 93.7  PLT 310 295  --  196  --  168  --   --   --   --  249 209   < > = values in this interval not displayed.   CBG: Recent Labs  Lab 06/01/24 0344 06/01/24 0446 06/01/24 0732 06/01/24 1116 06/01/24 1248  GLUCAP 111* 121* 93 104* 121*   Coagulation Studies: Recent Labs    05/30/24 1745 05/31/24 1312  LABPROT 12.8 15.9*  INR 0.9 1.2   Medications:     Current Medications:  acetaminophen   1,000 mg Oral Q6H   Or   acetaminophen  (TYLENOL ) oral  liquid 160 mg/5 mL  1,000 mg Per Tube Q6H   aspirin  EC  325 mg Oral Daily   Or   aspirin   324 mg Per Tube Daily   bisacodyl  10 mg Oral Daily   Or   bisacodyl  10 mg Rectal Daily   Chlorhexidine  Gluconate Cloth  6 each Topical Daily   colchicine  0.6 mg Oral Daily   docusate sodium   200 mg Oral Daily  enoxaparin (LOVENOX) injection  40 mg Subcutaneous QHS   fluticasone   2 spray Each Nare Daily   furosemide       gabapentin   600 mg Oral BID   insulin aspart  0-9 Units Subcutaneous TID WC   methimazole   10 mg Oral Daily   metoCLOPramide  (REGLAN ) injection  10 mg Intravenous Q6H   metoprolol  tartrate  12.5 mg Oral BID   Or   metoprolol  tartrate  12.5 mg Per Tube BID   [START ON 06/02/2024] pantoprazole   40 mg Oral Daily   pantoprazole  (PROTONIX ) IV  40 mg Intravenous QHS   rosuvastatin   40 mg Oral Daily   sodium chloride  flush  3 mL Intravenous Q12H   tamsulosin   0.4 mg Oral BID   testosterone   5 g Transdermal Daily   umeclidinium-vilanterol  1 puff Inhalation Daily    Infusions:  sodium chloride      albumin  human Stopped (05/31/24 2332)    ceFAZolin  (ANCEF ) IV 2 g (06/01/24 1306)   dexmedetomidine (PRECEDEX) IV infusion Stopped (05/31/24 1648)   phenylephrine  (NEO-SYNEPHRINE) Adult infusion 60 mcg/min (06/01/24 1000)   Patient Profile   DARDEN FLEMISTER is a 73 y.o. male with history of CKD 3b, HTN, BPH, ED, DDD, bronchiectasis, and hyperthyroidism. Presented with chest pain, found to have 3 vessel disease. Now s/p CABGx4  Assessment/Plan   Acute HFpEF  Pericardial Friction Rub - Pre-op echo and intra-op echo with normal EF 65-70%, G1DD, and normal RV - pericardial friction rub not heard on auscultation per Dr. Cherrie - start colchicine 0.6 mg daily - significantly volume up on exam - start CVP monitoring, check co-ox - give IV Lasix 80 mg  - repeat ltd echo  NSTEMI  Multivessel CAD - LHC 10/25 with multivessel CAD, culprit lesion 95% distal RCA - s/p CABG x4   (LIMA-LAD, SVG-diag, SVG-OM, SVG-PDA)  - continue ASA  - on metoprolol  12.5 mg bid  CKD 3b - baseline Cr 1.6-1.7 - today 1.79 - diuresis as above  Length of Stay: 3  [place] Lee, NP  06/01/2024, 2:06 PM  Advanced Heart Failure Team Pager [phone] (M-F; 7a - 5p)  Please contact [facility] for night-coverage after hours (4p -7a ) and weekends on [email or web address]  Agree with above.   POD # s/p CABG. We are asked to see due to persistent ST elevation and possible pericarditis.   Now on neo at 60.   Has had progressive SOB throughout the am. No responsive to IV lasix 20   Chest sore. No heaviness/tightness.   General:  Sitting up in bed. Audible wheezing/rhonchi No resp difficulty HEENT: normal Neck: supple.JVP to ear Carotids 2+ bilat; no bruits. No lymphadenopathy or thryomegaly appreciated. Cor: Sternal dressing Regular rate & rhythm. No appreciable rub. Lungs:+ rhonchi/wheezing Abdomen: soft, nontender, nondistended. Hypoactive BS Extremities: no cyanosis, clubbing, rash, edema Neuro: alert & orientedx3, cranial nerves grossly intact. moves all 4 extremities w/o difficulty. Affect pleasant   Appears volume overloaded on exam. Will give lasix 80 IV. I worry about potential Rv failure. May need milrinone. Check co-ox  May hve component of post-op pericarditis based on ECG but I don't hear rub. Will start colchicine 0.6 daily   I will d/w Dr. Kerrin.   CRITICAL CARE Performed by: Cherrie Sieving  Total critical care time: 44 minutes  Critical care time was exclusive of separately billable procedures and treating other patients.  Critical care was necessary to treat or prevent imminent or life-threatening deterioration.  Critical care was  time spent personally by me (independent of midlevel providers or residents) on the following activities: development of treatment plan with patient and/or surrogate as well as nursing, discussions with consultants, evaluation of  patient's response to treatment, examination of patient, obtaining history from patient or surrogate, ordering and performing treatments and interventions, ordering and review of laboratory studies, ordering and review of radiographic studies, pulse oximetry and re-evaluation of patient's condition.  Toribio Fuel, MD  3:21 PM

## 2024-06-01 NOTE — Discharge Instructions (Signed)

## 2024-06-01 NOTE — Progress Notes (Signed)
 1 Day Post-Op Procedure(s) (LRB): CORONARY ARTERY BYPASS GRAFTING (CABG) TIMES FOUR , USING LEFT INTERNAL MAMMARY ARTERY AND GREATER SAPHENOUS VEIN HARVESTED ENDOSCOPICALLY (N/A) ECHOCARDIOGRAM, TRANSESOPHAGEAL, INTRAOPERATIVE (N/A) Subjective: C/o incisional pain  Objective: Vital signs in last 24 hours: Temp:  [96.1 F (35.6 C)-99.1 F (37.3 C)] 98.6 F (37 C) (10/24 0715) Pulse Rate:  [64-100] 85 (10/24 0715) Cardiac Rhythm: Normal sinus rhythm (10/24 0400) Resp:  [7-26] 26 (10/24 0715) BP: (66-130)/(55-117) 108/75 (10/24 0600) SpO2:  [82 %-100 %] 93 % (10/24 0715) Arterial Line BP: (82-142)/(40-69) 116/54 (10/24 0715) FiO2 (%):  [40 %-50 %] 40 % (10/23 1704) Weight:  [86.3 kg] 86.3 kg (10/24 0500)  Hemodynamic parameters for last 24 hours: PAP: (19-51)/(9-37) 34/30 CO:  [3.2 L/min-4.8 L/min] 4.7 L/min CI:  [1.64 L/min/m2-2.5 L/min/m2] 2.4 L/min/m2  Intake/Output from previous day: 10/23 0701 - 10/24 0700 In: 7094.9 [I.V.:4749.4; Blood:300; IV Piggyback:2045.5] Out: 2579 [Urine:1295; Blood:450; Chest Tube:834] Intake/Output this shift: Total I/O In: -  Out: 140 [Urine:140]  General appearance: alert, cooperative, and mild distress Neurologic: intact Heart: regular rate and rhythm and + rub Lungs: diminished breath sounds bibasilar Abdomen: soft + BS  Lab Results: Recent Labs    05/31/24 1901 06/01/24 0443  WBC 14.9* 14.9*  HGB 9.6* 8.7*  HCT 29.8* 26.7*  PLT 249 209   BMET:  Recent Labs    05/31/24 1901 06/01/24 0443  NA 138 132*  K 5.5* 5.5*  CL 110 105  CO2 20* 19*  GLUCOSE 148* 118*  BUN 19 19  CREATININE 1.72* 1.79*  CALCIUM  7.6* 7.3*    PT/INR:  Recent Labs    05/31/24 1312  LABPROT 15.9*  INR 1.2   ABG    Component Value Date/Time   PHART 7.268 (L) 05/31/2024 1843   HCO3 20.8 05/31/2024 1843   TCO2 22 05/31/2024 1843   ACIDBASEDEF 6.0 (H) 05/31/2024 1843   O2SAT 97 05/31/2024 1843   CBG (last 3)  Recent Labs    06/01/24 0344  06/01/24 0446 06/01/24 0732  GLUCAP 111* 121* 93    Assessment/Plan: S/P Procedure(s) (LRB): CORONARY ARTERY BYPASS GRAFTING (CABG) TIMES FOUR , USING LEFT INTERNAL MAMMARY ARTERY AND GREATER SAPHENOUS VEIN HARVESTED ENDOSCOPICALLY (N/A) ECHOCARDIOGRAM, TRANSESOPHAGEAL, INTRAOPERATIVE (N/A) POD # 1 NEURO - intact CV- in Sr with good hemodynamics  ECG with diffuse ST elevation- likely pericarditis with rub  Dc Swan  ASA, statin, beta blocker RESP- IS RENAL- creatinine 1.8, up from yesterday, below baseline  Diurese ENDO- CBG well controlled  Change to SSI AC and HS Gi- diet as tolerated SCD + enoxaparin Mobilize CT with moderate serosanguinous output overnight- leave in this AM   LOS: 3 days    Victor Price 06/01/2024

## 2024-06-01 NOTE — Plan of Care (Signed)
 Problem: Education: Goal: Knowledge of General Education information will improve Description: Including pain rating scale, medication(s)/side effects and non-pharmacologic comfort measures Outcome: Progressing   Problem: Health Behavior/Discharge Planning: Goal: Ability to manage health-related needs will improve Outcome: Progressing   Problem: Clinical Measurements: Goal: Ability to maintain clinical measurements within normal limits will improve Outcome: Progressing Goal: Will remain free from infection Outcome: Progressing Goal: Diagnostic test results will improve Outcome: Progressing Goal: Respiratory complications will improve Outcome: Progressing Goal: Cardiovascular complication will be avoided Outcome: Progressing   Problem: Activity: Goal: Risk for activity intolerance will decrease Outcome: Progressing   Problem: Nutrition: Goal: Adequate nutrition will be maintained Outcome: Progressing   Problem: Coping: Goal: Level of anxiety will decrease Outcome: Progressing   Problem: Elimination: Goal: Will not experience complications related to bowel motility Outcome: Progressing Goal: Will not experience complications related to urinary retention Outcome: Progressing   Problem: Pain Managment: Goal: General experience of comfort will improve and/or be controlled Outcome: Progressing   Problem: Safety: Goal: Ability to remain free from injury will improve Outcome: Progressing   Problem: Skin Integrity: Goal: Risk for impaired skin integrity will decrease Outcome: Progressing   Problem: Education: Goal: Understanding of CV disease, CV risk reduction, and recovery process will improve Outcome: Progressing Goal: Individualized Educational Video(s) Outcome: Progressing   Problem: Activity: Goal: Ability to return to baseline activity level will improve Outcome: Progressing   Problem: Cardiovascular: Goal: Ability to achieve and maintain adequate  cardiovascular perfusion will improve Outcome: Progressing Goal: Vascular access site(s) Level 0-1 will be maintained Outcome: Progressing   Problem: Health Behavior/Discharge Planning: Goal: Ability to safely manage health-related needs after discharge will improve Outcome: Progressing   Problem: Education: Goal: Will demonstrate proper wound care and an understanding of methods to prevent future damage Outcome: Progressing Goal: Knowledge of disease or condition will improve Outcome: Progressing Goal: Knowledge of the prescribed therapeutic regimen will improve Outcome: Progressing Goal: Individualized Educational Video(s) Outcome: Progressing   Problem: Activity: Goal: Risk for activity intolerance will decrease Outcome: Progressing   Problem: Cardiac: Goal: Will achieve and/or maintain hemodynamic stability Outcome: Progressing   Problem: Clinical Measurements: Goal: Postoperative complications will be avoided or minimized Outcome: Progressing   Problem: Respiratory: Goal: Respiratory status will improve Outcome: Progressing   Problem: Skin Integrity: Goal: Wound healing without signs and symptoms of infection Outcome: Progressing Goal: Risk for impaired skin integrity will decrease Outcome: Progressing   Problem: Urinary Elimination: Goal: Ability to achieve and maintain adequate renal perfusion and functioning will improve Outcome: Progressing   Problem: Education: Goal: Ability to describe self-care measures that may prevent or decrease complications (Diabetes Survival Skills Education) will improve Outcome: Progressing Goal: Individualized Educational Video(s) Outcome: Progressing   Problem: Coping: Goal: Ability to adjust to condition or change in health will improve Outcome: Progressing   Problem: Fluid Volume: Goal: Ability to maintain a balanced intake and output will improve Outcome: Progressing   Problem: Health Behavior/Discharge  Planning: Goal: Ability to identify and utilize available resources and services will improve Outcome: Progressing Goal: Ability to manage health-related needs will improve Outcome: Progressing   Problem: Metabolic: Goal: Ability to maintain appropriate glucose levels will improve Outcome: Progressing   Problem: Nutritional: Goal: Maintenance of adequate nutrition will improve Outcome: Progressing Goal: Progress toward achieving an optimal weight will improve Outcome: Progressing   Problem: Skin Integrity: Goal: Risk for impaired skin integrity will decrease Outcome: Progressing   Problem: Tissue Perfusion: Goal: Adequacy of tissue perfusion will improve Outcome: Progressing

## 2024-06-01 NOTE — Progress Notes (Signed)
 cxr

## 2024-06-01 NOTE — Discharge Summary (Signed)
 [address] [place] [address]             [phone]        Physician Discharge Summary  Patient ID: Victor Price MRN: [id number] DOB/AGE: Jun 21, 1951 73 y.o.  Admit date: 05/26/2024 Discharge date: 06/05/2024  Admission Diagnoses:  Patient Active Problem List   Diagnosis Date Noted   Hyperthyroidism 05/27/2024   History of Graves' disease 05/27/2024   Prediabetes 05/27/2024   NSTEMI (non-ST elevated myocardial infarction) (HCC) 05/26/2024   Precordial chest pain 05/26/2024   Gustatory Rhinorrhea 01/06/2024   S/P total right hip arthroplasty 08/26/2023   Pain in right hip 04/19/2023   IFG (impaired fasting glucose) 02/01/2023   Hemorrhoid 11/04/2022   Cystic acne 11/04/2022   Left inguinal hernia 01/27/2022   Carpal tunnel syndrome, left upper limb 12/04/2019   H/O total hip arthroplasty, left 10/19/2019   DDD (degenerative disc disease), lumbar 06/05/2019   Hypothyroidism 10/31/2017   Hypogonadism in male 10/31/2017   CKD stage 3b, GFR 30-44 ml/min (HCC) 02/08/2017   Bronchiectasis (HCC) 07/20/2016   Radiculitis of right cervical region 08/28/2014   DDD (degenerative disc disease), cervical 08/28/2014   Insomnia 04/30/2014   Peripheral neuropathy 04/02/2013   Herniated disc 01/29/2013   BPH (benign prostatic hyperplasia) 12/14/2011   ERECTILE DYSFUNCTION, ORGANIC 07/23/2010   THYROID  NODULE, LEFT 05/23/2007   Hyperlipidemia 05/17/2006   Essential hypertension 05/17/2006   Discharge Diagnoses:  Patient Active Problem List   Diagnosis Date Noted   S/P CABG x 4 05/31/2024   Hyperthyroidism 05/27/2024   History of Graves' disease 05/27/2024   Prediabetes 05/27/2024   NSTEMI (non-ST elevated myocardial infarction) (HCC) 05/26/2024   Precordial chest pain 05/26/2024   Gustatory Rhinorrhea 01/06/2024   S/P total right hip arthroplasty 08/26/2023   Pain in right hip 04/19/2023   IFG (impaired fasting glucose) 02/01/2023    Hemorrhoid 11/04/2022   Cystic acne 11/04/2022   Left inguinal hernia 01/27/2022   Carpal tunnel syndrome, left upper limb 12/04/2019   H/O total hip arthroplasty, left 10/19/2019   DDD (degenerative disc disease), lumbar 06/05/2019   Hypothyroidism 10/31/2017   Hypogonadism in male 10/31/2017   CKD stage 3b, GFR 30-44 ml/min (HCC) 02/08/2017   Bronchiectasis (HCC) 07/20/2016   Radiculitis of right cervical region 08/28/2014   DDD (degenerative disc disease), cervical 08/28/2014   Insomnia 04/30/2014   Peripheral neuropathy 04/02/2013   Herniated disc 01/29/2013   BPH (benign prostatic hyperplasia) 12/14/2011   ERECTILE DYSFUNCTION, ORGANIC 07/23/2010   THYROID  NODULE, LEFT 05/23/2007   Hyperlipidemia 05/17/2006   Essential hypertension 05/17/2006   Discharged Condition: stable  History of Present Illness:  Victor Price is 73 yo male with known history of HTN, CKD Stage 3, Hypothyroidism/Graves Disease, HLD, and BPH. The patient presented to an Urgent Care on 10/17 with complaints of chest pain that he described as someone having knees in his chest. This had been occurring over several days. He denied associated shortness of breath. EKG was obtained and showed NSR that was identical to previous EKG on file. He was discharged, but instructed to report to the ED should his pain worsen. On 10/18 he presented to [facility] Emergency Department with complaints of intermittent chest pain. He complained that episodes had occurred 6 times over the past 3-4 days and lasted about 20 min each time. He again denied shortness of breath, N/V, and lightheadedness. EKG remained normal with minimal elevation  in Troponin level to 81. Cardiology was consulted and recommended admission to medicine service.   Hospital Course:  Due to patient's baseline creatinine of 1.95 CTA was not recommended. His troponin levels increased to over 200. Due to this it was recommended he undergo catheterization which was  performed 05/29/2024 and showed CAD.  He was evaluated by Dr. Kerrin who was in agreement the patient would benefit from coronary bypass grafting procedure.  The risks and benefits of the procedure were explained to the patient and he was agreeable to proceed.  The patient was taken to the operating room on 05/31/2024.  He underwent Coronary bypass grafting x 4 utilizing LIMA to LAD, SVG to PDA, SVG to OM, and SVG to PDA.  He also underwent endoscopic harvest from greater saphenous vein from his right leg.  He tolerated the procedure without difficulty and was taken to the SICU in stable condition.  The patient was extubated the evening of surgery.  He required some blood pressure support with Neo-synephrine which was weaned as tolerated.  The patient had hyperkalemia and he was started on diuretics.  His post operative EKG showed some ST elevation which was felt to be pericarditis.  He has known CKD Stage 3 with admission creatinine of 1.95.  Transthoracic echo obtained on postop day 1 showed normal left ventricular ejection fraction with mildly reduced RV function.  By postop day 2, the creatinine had trended up to 2.8.  He was started on Lasix drip by the advanced heart failure team.  He complained of some visual disturbances prompting head CT scan.  Study was negative for any acute abnormality.  He had good response to the Lasix drip with a net -5 L urine output on postop day 2.  Creatinine stabilized at 2.6.  Diet and activity were advanced routinely.  Chest tubes and monitoring lines were removed on postop day 3.  He has a history of dizziness with metoprolol .  Due to this he was placed on atenolol.  He was started on colchicine for post operative pericarditis.  He went into a fib and was placed on an Amiodarone drip. He was felt stable for transfer to the progressive care unit on 06/04/2024. Creatinine was down to 2.2 on 10/28 (baseline appears around 2). He maintained sinus rhythm and was transitioned  to oral Amiodarone. Toprol  XL was stopped (causes dizziness) and he was restarted on Bisoprolol . Pre op HGA1C was 5.5 so accu checks and SS PRN were stopped. He has been ambulating on room air with good oxygenation. He has been tolerating a diet and has had bowel movements. All wounds are clean, dry and healing without signs of infection. Per patient,he does not want any prescription for pain medication as already has at home. As discussed with Dr. Kerrin, will arrange for Zio patch prior to discharge. Chest tube sutures to remain as will be removed in the office next week. He is felt stable for discharge.  Consults: None  Significant Diagnostic Studies: angiography:     Prox RCA-1 lesion is 20% stenosed.  Prox RCA-2 lesion is 70% stenosed.   Dist RCA-1 lesion is 95% stenosed.   Dist RCA-2 lesion is 85% stenosed.  RPDA lesion is 85% stenosed.   Ost LAD to Mid LAD lesion is 20% stenosed with 70% stenosed side branch in 1st Diag.   Mid LAD-1 lesion is 50% stenosed.  Mid LAD-2 lesion is 70% stenosed. Mid LAD-3 lesion is 50% stenosed with 40% stenosed side branch in 2nd Diag. (  VFFR for both the sidebranch and LAD was 0.77)   Mid Cx to Dist Cx lesion is 50% stenosed with 60% stenosed side branch in 2nd Mrg.   ------------------------------------   LV end diastolic pressure is normal.  There is no aortic valve stenosis.  Severe Multivessel CAD: 20% proximal RCA followed by 70% eccentric lesion in the prox-mid RCA followed by the CULPRIT LESION which is a 95% focal lesion in the distal RCA and then an extensive 85% stenosis from distal RCA into PDA crossing the PAV Heavily calcified Left Main into the LAD and LCx: Minimal proximal disease with 70% ostial to proximal 1st Diag, 50 with focal 70% stenosis mid LAD prior to 2nd Diag then 40% at the bifurcation of D2 with 50% ostial 2nd Diag (the FFR of the 70% LAD lesion was 0.77 for both the LAD and 2nd Diag) 50 to 60% proximal LCx into the AV groove and  OM1 branch. Borderline significant. Normal LVEDP     RECOMMENDATIONS   Will place referral for CVTS consultation; continue to titrate GDMT for CAD   Will restart IV heparin 8 hours post TR band Removal   Recommend Aspirin  81mg  daily for moderate CAD.    Alm Clay, MD  Treatments: surgery:   NAMEJAKELL, TRUSTY MEDICAL RECORD NO: [id number] ACCOUNT NO: [id number] DATE OF BIRTH: July 17, 1951 FACILITY: [facility] LOCATION: [facility] PHYSICIAN: Elspeth BROCKS. Kerrin, MD   Operative Report    DATE OF PROCEDURE: 05/31/2024   PREOPERATIVE DIAGNOSIS: Three-vessel coronary artery disease status post non-ST elevation MI.   POSTOPERATIVE DIAGNOSIS: Three-vessel coronary artery disease status post non-ST elevation MI.   PROCEDURE: Median sternotomy, extracorporeal circulation, coronary artery bypass grafting x4 (left internal mammary artery to LAD, saphenous vein graft to first diagonal, saphenous vein graft to obtuse marginal 1, saphenous vein graft to posterior  descending). Endoscopic vein harvest right leg.   SURGEON: Elspeth BROCKS. Kerrin, MD   ASSISTANT: Rocky Shad, PA. Experienced assistance was necessary for this case due to surgical complexity. Erin Barrett independently harvested the saphenous vein endoscopically and then closed the leg incisions. She assisted with retraction of delicate  tissues, exposure, suctioning, and suture management during the anastomosis.   Discharge Exam: Cardiovascular: RRR Pulmonary: Clear to auscultation bilaterally Abdomen: Soft, non tender, bowel sounds present. Extremities: Trace bilateral lower extremity edema. Wounds: Clean and dry.  No erythema or signs of infection.  Discharge Medications:  The patient has been discharged on:   1.Beta Blocker:  Yes [  x ]                              No   [   ]                              If No, reason:  2.Ace Inhibitor/ARB: Yes [   ]                                     No  [  x  ]                                      If No, reason:AKI on CKD (stage IIIb)  3.Statin:   Yes [  x ]                  No  [   ]                  If No, reason:  4.Ecasa:  Yes  [  x ]                  No   [   ]                  If No, reason:  Patient had ACS upon admission:YES  Plavix/P2Y12 inhibitor: Yes [  x ]                                      No  [   ]     Discharge Instructions     Amb Referral to Cardiac Rehabilitation   Complete by: As directed    External referral to [facility]   Diagnosis:  CABG NSTEMI     CABG X ___: 4   After initial evaluation and assessments completed: Virtual Based Care may be provided alone or in conjunction with Phase 2 Cardiac Rehab based on patient barriers.: Yes   Intensive Cardiac Rehabilitation (ICR) [facility] location only OR Traditional Cardiac Rehabilitation (TCR) *If criteria for ICR are not met will enroll in TCR ([facility] only): Yes      Allergies as of 06/05/2024       Reactions   Losartan  Other (See Comments)   Lightheadedness, dizziness, syncope   Metoprolol  Other (See Comments)   dizziness   Nsaids Other (See Comments)   CKD 3         Medication List     STOP taking these medications    bisoprolol -hydrochlorothiazide  10-6.25 MG tablet Commonly known as: ZIAC    Farxiga  10 MG Tabs tablet Generic drug: dapagliflozin  propanediol       TAKE these medications    amiodarone 200 MG tablet Commonly known as: PACERONE Take 2 tablets (400 mg total) by mouth 2 (two) times daily for 4 days, THEN 1 tablet (200 mg total) 2 (two) times daily for 7 days, THEN 1 tablet (200 mg total) daily thereafter. Start taking on: June 05, 2024   Anoro Ellipta  62.5-25 MCG/ACT Aepb Generic drug: umeclidinium-vilanterol Inhale 1 puff into the lungs daily.   aspirin  EC 81 MG tablet Take 1 tablet (81 mg total) by mouth daily. Swallow whole. Start taking on: June 06, 2024   bisoprolol  10 MG tablet Commonly known as: ZEBETA  Take 1 tablet (10 mg total) by  mouth daily.   clopidogrel 75 MG tablet Commonly known as: PLAVIX Take 1 tablet (75 mg total) by mouth daily. Start taking on: June 06, 2024   colchicine 0.6 MG tablet Take 1 tablet (0.6 mg total) by mouth daily. Start taking on: June 06, 2024   [facility] Allergy  Relief-D 10-240 MG 24 hr tablet Generic drug: loratadine -pseudoephedrine  TAKE 1 TABLET BY MOUTH EVERY DAY   fluticasone  50 MCG/ACT nasal spray Commonly known as: FLONASE  Place 2 sprays into both nostrils daily as needed for allergies.   gabapentin  600 MG tablet Commonly known as: Neurontin  Take 0.5 tablets (300 mg total) by mouth 2 (two) times daily.   ipratropium 0.03 % nasal spray Commonly known as: ATROVENT  Place 2 sprays into both nostrils daily as needed for rhinitis (runny nose).   methimazole  10  MG tablet Commonly known as: TAPAZOLE  Take 1 tablet (10 mg total) by mouth daily.   rosuvastatin  40 MG tablet Commonly known as: CRESTOR  Take 1 tablet (40 mg total) by mouth daily. Start taking on: June 06, 2024   sildenafil  20 MG tablet Commonly known as: REVATIO  Take 1 tablet (20 mg total) by mouth daily.   tamsulosin  0.4 MG Caps capsule Commonly known as: FLOMAX  TAKE 1 CAPSULE (0.4 MG TOTAL) BY MOUTH IN THE MORNING AND AT BEDTIME   Testosterone  12.5 MG/ACT (1%) Gel APPLY 1 PUMP TO AN INNER THIGH AND 1 PUMPS TO OPPOSITE INNER THIGH IN AM   traZODone  50 MG tablet Commonly known as: DESYREL  Take 2 tablets (100 mg total) by mouth at bedtime. What changed: See the new instructions.   triamcinolone  cream 0.1 % Commonly known as: KENALOG  Apply 1 Application topically 2 (two) times daily as needed (Rash).        Follow-up Information     Rutha Manuelita HERO, PA-C Follow up on 06/11/2024.   Specialties: Physician Assistant, Thoracic Surgery Why: Appointment is at 2:00, please get CXR at 1:00 prior to your appointment on the 2nd floor of our office building Contact information: [address]  [place] [address] [phone]         Devora Prom D, NP Follow up on 06/21/2024.   Specialty: Cardiology Why: Appointment is at 10:05 Contact information: [address] [place] [address] [phone]         [name], MD Follow up.   Specialty: Family Medicine Why: hospital follow up scheduled 06/12/2024 at 4 pm. please arrive 15 minutes before you appt Contact information: [address] [place] [address] [phone]                 Signed:  Kyla HERO Dwan DEVONNA  06/05/2024, 11:37 AM

## 2024-06-01 NOTE — Progress Notes (Signed)
 [facility] PM Rounding Progress Note  Dyspnea improved throughout the day with lasix. Resting comfortably in bed now  Vitals:   06/01/24 1700 06/01/24 1800  BP: 135/84 107/64  Pulse: (!) 113 (!) 102  Resp: 13 14  Temp:    SpO2: 92% (!) 89%    Plan: - Continue current care  Con Clunes, MD Cardiothoracic Surgery Pager: [phone]

## 2024-06-01 NOTE — Telephone Encounter (Signed)
 Received notification from [facility] that the manufacturer and is unable to supply the testosterone  10 mg gel pump but they will cover the tube or we can change it to the 12.5 mg pump.

## 2024-06-02 ENCOUNTER — Inpatient Hospital Stay (HOSPITAL_COMMUNITY)

## 2024-06-02 DIAGNOSIS — J811 Chronic pulmonary edema: Secondary | ICD-10-CM | POA: Diagnosis not present

## 2024-06-02 DIAGNOSIS — R918 Other nonspecific abnormal finding of lung field: Secondary | ICD-10-CM | POA: Diagnosis not present

## 2024-06-02 DIAGNOSIS — R930 Abnormal findings on diagnostic imaging of skull and head, not elsewhere classified: Secondary | ICD-10-CM | POA: Diagnosis not present

## 2024-06-02 DIAGNOSIS — I214 Non-ST elevation (NSTEMI) myocardial infarction: Secondary | ICD-10-CM | POA: Diagnosis not present

## 2024-06-02 DIAGNOSIS — Z951 Presence of aortocoronary bypass graft: Secondary | ICD-10-CM | POA: Diagnosis not present

## 2024-06-02 DIAGNOSIS — Z48812 Encounter for surgical aftercare following surgery on the circulatory system: Secondary | ICD-10-CM | POA: Diagnosis not present

## 2024-06-02 LAB — BASIC METABOLIC PANEL WITH GFR
Anion gap: 8 (ref 5–15)
Anion gap: 8 (ref 5–15)
BUN: 31 mg/dL — ABNORMAL HIGH (ref 8–23)
BUN: 35 mg/dL — ABNORMAL HIGH (ref 8–23)
CO2: 21 mmol/L — ABNORMAL LOW (ref 22–32)
CO2: 22 mmol/L (ref 22–32)
Calcium: 7.4 mg/dL — ABNORMAL LOW (ref 8.9–10.3)
Calcium: 7.8 mg/dL — ABNORMAL LOW (ref 8.9–10.3)
Chloride: 97 mmol/L — ABNORMAL LOW (ref 98–111)
Chloride: 100 mmol/L (ref 98–111)
Creatinine, Ser: 2.78 mg/dL — ABNORMAL HIGH (ref 0.61–1.24)
Creatinine, Ser: 2.69 mg/dL — ABNORMAL HIGH (ref 0.61–1.24)
GFR, Estimated: 24 mL/min — ABNORMAL LOW (ref >60)
GFR, Estimated: 25 mL/min — ABNORMAL LOW (ref >60)
Glucose, Bld: 114 mg/dL — ABNORMAL HIGH (ref 70–99)
Glucose, Bld: 136 mg/dL — ABNORMAL HIGH (ref 70–99)
Potassium: 4.7 mmol/L (ref 3.5–5.1)
Potassium: 4.2 mmol/L (ref 3.5–5.1)
Sodium: 126 mmol/L — ABNORMAL LOW (ref 135–145)
Sodium: 130 mmol/L — ABNORMAL LOW (ref 135–145)

## 2024-06-02 LAB — CBC
HCT: 24.6 % — ABNORMAL LOW (ref 39.0–52.0)
Hemoglobin: 7.9 g/dL — ABNORMAL LOW (ref 13.0–17.0)
MCH: 30.5 pg (ref 26.0–34.0)
MCHC: 32.1 g/dL (ref 30.0–36.0)
MCV: 95 fL (ref 80.0–100.0)
Platelets: 169 K/uL (ref 150–400)
RBC: 2.59 MIL/uL — ABNORMAL LOW (ref 4.22–5.81)
RDW: 14.5 % (ref 11.5–15.5)
WBC: 12.2 K/uL — ABNORMAL HIGH (ref 4.0–10.5)
nRBC: 0 % (ref 0.0–0.2)

## 2024-06-02 LAB — GLUCOSE, CAPILLARY
Glucose-Capillary: 87 mg/dL (ref 70–99)
Glucose-Capillary: 76 mg/dL (ref 70–99)
Glucose-Capillary: 97 mg/dL (ref 70–99)

## 2024-06-02 LAB — COOXEMETRY PANEL
Carboxyhemoglobin: 1.7 % — ABNORMAL HIGH (ref 0.5–1.5)
Methemoglobin: 1.7 % — ABNORMAL HIGH (ref 0.0–1.5)
O2 Saturation: 70.8 %
Total hemoglobin: 8.6 g/dL — ABNORMAL LOW (ref 12.0–16.0)

## 2024-06-02 MED ORDER — FUROSEMIDE 10 MG/ML IJ SOLN
8.0000 mg/h | INTRAVENOUS | Status: AC
  Administered 2024-06-02 – 2024-06-03 (×2): 8 mg/h via INTRAVENOUS
  Filled 2024-06-02 (×2): qty 20

## 2024-06-02 MED ORDER — ENOXAPARIN SODIUM 30 MG/0.3ML IJ SOSY
30.0000 mg | PREFILLED_SYRINGE | Freq: Every day | INTRAMUSCULAR | Status: DC
  Administered 2024-06-02 – 2024-06-04 (×3): 30 mg via SUBCUTANEOUS
  Filled 2024-06-02 (×3): qty 0.3

## 2024-06-02 MED ORDER — FUROSEMIDE 10 MG/ML IJ SOLN
80.0000 mg | Freq: Once | INTRAMUSCULAR | Status: AC
  Filled 2024-06-02: qty 8

## 2024-06-02 NOTE — Progress Notes (Signed)
 [facility] PM Rounding Progress Note  Visual disturbances this morning, head CT negative. Reports vision changes are much better, thinks likely due to polypharmacy.  Has walked a couple times, chest tube outputs decreasing, responding well to lasix gtt with Cre improved this evening. Nasal cannula down to 1.5L  Vitals:   06/02/24 1715 06/02/24 1800  BP: 126/71 127/74  Pulse: (!) 113 (!) 106  Resp: 17 13  Temp:    SpO2: 93% 92%    Plan: - Continue lasix gtt at 8  - Continue to wean oxygen as tolerated - Minimize narcotics as able  Con Clunes, MD Cardiothoracic Surgery Pager: [phone]

## 2024-06-02 NOTE — Progress Notes (Addendum)
 Advanced Heart Failure Rounding Note   Subjective:     Feels much better. Denies SOB.   Co-ox 71%  Modest diuresis. Wt 162 -> 190 -> 191  SCr 1.79 -> 2.39 -> 2.78  Echo EF 60-65 RV mild HK   Objective:   Weight Range:  Vital Signs:   Temp:  [97.8 F (36.6 C)-98.1 F (36.7 C)] 97.8 F (36.6 C) (10/25 1139) Pulse Rate:  [83-113] 101 (10/25 1200) Resp:  [0-24] 13 (10/25 1239) BP: (80-135)/(40-84) 128/68 (10/25 1200) SpO2:  [79 %-100 %] 79 % (10/25 1200) Arterial Line BP: (134-142)/(52-59) 134/52 (10/24 1600) Weight:  [86.9 kg] 86.9 kg (10/25 0500) Last BM Date : 05/27/24  Weight change: Filed Weights   05/31/24 0637 06/01/24 0500 06/02/24 0500  Weight: (P) 74.8 kg 86.3 kg 86.9 kg    Intake/Output:   Intake/Output Summary (Last 24 hours) at 06/02/2024 1404 Last data filed at 06/02/2024 1301 Gross per 24 hour  Intake 694.5 ml  Output 2115 ml  Net -1420.5 ml     Physical Exam: General:  Sitting up in bed No resp difficulty HEENT: normal Neck: supple. JVP up . Carotids 2+ bilat; no bruits. No lymphadenopathy or thryomegaly appreciated. Cor: Sternal dressing ok. Regular rate & rhythm. No rubs, gallops or murmurs. Lungs: clear Abdomen: soft, nontender, nondistended. No hepatosplenomegaly. No bruits or masses. Good bowel sounds. Extremities: no cyanosis, clubbing, rash, 2+ edema Neuro: alert & orientedx3, cranial nerves grossly intact. moves all 4 extremities w/o difficulty. Affect pleasant  Telemetry: sinus 95-105 Personally reviewed   Labs: Basic Metabolic Panel: Recent Labs  Lab 05/29/24 0403 05/29/24 1026 05/30/24 0456 05/31/24 0415 05/31/24 0823 05/31/24 1210 05/31/24 1314 05/31/24 1843 05/31/24 1901 06/01/24 0443 06/01/24 1838 06/02/24 0456  NA 138   < > 138 138   < > 140   < > 138 138 132* 131* 126*  K 4.5   < > 4.7 3.9   < > 5.5*   < > 5.6* 5.5* 5.5* 4.6 4.7  CL 105   < > 110 107   < > 107  --   --  110 105 102 97*  CO2 24   < > 21* 22   --   --   --   --  20* 19* 19* 21*  GLUCOSE 120*   < > 96 116*   < > 123*  --   --  148* 118* 115* 114*  BUN 33*   < > 21 19   < > 19  --   --  19 19 25* 31*  CREATININE 2.31*   < > 1.80* 1.68*   < > 1.50*  --   --  1.72* 1.79* 2.39* 2.78*  CALCIUM  8.8*   < > 8.4* 8.2*  --   --   --   --  7.6* 7.3* 7.5* 7.4*  MG 2.3  --  2.1  --   --   --   --   --  3.2* 2.8* 2.7*  --    < > = values in this interval not displayed.    Liver Function Tests: No results for input(s): AST, ALT, ALKPHOS, BILITOT, PROT, ALBUMIN  in the last 168 hours. No results for input(s): LIPASE, AMYLASE in the last 168 hours. No results for input(s): AMMONIA in the last 168 hours.  CBC: Recent Labs  Lab 05/31/24 1312 05/31/24 1314 05/31/24 1843 05/31/24 1901 06/01/24 0443 06/01/24 1838 06/02/24 0456  WBC 12.7*  --   --  14.9* 14.9* 14.1* 12.2*  HGB 8.5*   < > 8.8* 9.6* 8.7* 8.6* 7.9*  HCT 25.8*   < > 26.0* 29.8* 26.7* 26.4* 24.6*  MCV 93.1  --   --  94.3 93.7 95.3 95.0  PLT 168  --   --  249 209 189 169   < > = values in this interval not displayed.    Cardiac Enzymes: No results for input(s): CKTOTAL, CKMB, CKMBINDEX, TROPONINI in the last 168 hours.  BNP: BNP (last 3 results) No results for input(s): BNP in the last 8760 hours.  ProBNP (last 3 results) No results for input(s): PROBNP in the last 8760 hours.    Other results:  Imaging: CT HEAD WO CONTRAST ( ) Result Date: 06/02/2024 EXAM: CT HEAD WITHOUT CONTRAST 06/02/2024 11:20:43 AM TECHNIQUE: CT of the head was performed without the administration of intravenous contrast. Automated exposure control, iterative reconstruction, and/or weight based adjustment of the mA/kV was utilized to reduce the radiation dose to as low as reasonably achievable. COMPARISON: Prior head CT 06/26/2023. CLINICAL HISTORY: 73 year old male. Vision changes after cardiac surgery. FINDINGS: BRAIN AND VENTRICLES: No acute hemorrhage. No  evidence of acute infarct. No hydrocephalus. No extra-axial collection. No mass effect or midline shift. Brain volume is stable, with minimal limits for age. Gray white differentiation stable and with minimal limits, including in the occipital lobes. No suspicious intracranial vascular hyperdensity. Mild for age calcified atherosclerosis. ORBITS: No acute abnormality. SINUSES: No acute abnormality. SOFT TISSUES AND SKULL: Soft tissues appear stable and normal. No skull fracture. IMPRESSION: 1. Stable and normal for age non-contrast head CT. Electronically signed by: Helayne Hurst MD 06/02/2024 11:36 AM EDT RP Workstation: [id number]   DG Chest Port 1 View Result Date: 06/02/2024 EXAM: 1 VIEW XRAY OF THE CHEST 06/02/2024 06:15:54 AM COMPARISON: 06/01/2024 CLINICAL HISTORY: [id number] S/P CABG (coronary artery bypass graft) [id number]. [id number] S/P CABG (coronary artery bypass graft) [id number] FINDINGS: LINES, TUBES AND DEVICES: Stable right IJ sheath. Mediastinal drain and left chest tube in place. LUNGS AND PLEURA: Small bilateral pleural effusions. Mild pulmonary edema. Increased left basilar opacities. HEART AND MEDIASTINUM: CABG. No acute abnormality of the cardiac and mediastinal silhouettes. BONES AND SOFT TISSUES: Post median sternotomy. IMPRESSION: 1. Mild pulmonary edema with increased left basilar opacities, which may reflect atelectasis or aspiration versus developing infection. 2. Small bilateral pleural effusions. 3. Right IJ sheath, mediastinal drain, and left chest tube are in place and unchanged. Electronically signed by: Waddell Calk MD 06/02/2024 07:33 AM EDT RP Workstation: [id number]   ECHOCARDIOGRAM LIMITED Result Date: 06/01/2024    ECHOCARDIOGRAM LIMITED REPORT   Patient Name:   Victor Price Date of Exam: 06/01/2024 Medical Rec #:  [id number]      Height:       72.0 in Accession #:    [id number]     Weight:       190.3 lb Date of Birth:  02-Jul-1951       BSA:          2.086 m Patient Age:    73 years        BP:           108/75 mmHg Patient Gender: M              HR:           94 bpm. Exam Location:  Inpatient Procedure: 2D Echo and Limited Echo (Both Spectral and Color Flow Doppler were  utilized during procedure). Indications:    CHF I50.9  History:        Patient has prior history of Echocardiogram examinations, most                 recent 05/28/2024. Risk Factors:Hypertension. NSTEMI, CABG,                 Precordial chest pain, Hyperlipidemia.  Sonographer:    BERNARDA ROCKS Referring Phys: [id number] JORDAN LEE  Sonographer Comments: Technically difficult due to S/P CABG. IMPRESSIONS  1. Left ventricular ejection fraction, by estimation, is 60 to 65%. The left ventricle has normal function.  2. Right ventricular systolic function is mildly reduced. The right ventricular size is normal.  3. The mitral valve is normal in structure. No evidence of mitral valve regurgitation.  4. The aortic valve was not well visualized. There is mild calcification of the aortic valve. Aortic valve sclerosis/calcification is present, without any evidence of aortic stenosis. FINDINGS  Left Ventricle: Left ventricular ejection fraction, by estimation, is 60 to 65%. The left ventricle has normal function. Right Ventricle: The right ventricular size is normal. No increase in right ventricular wall thickness. Right ventricular systolic function is mildly reduced. Left Atrium: Left atrial size was normal in size. Right Atrium: Right atrial size was normal in size. Pericardium: There is no evidence of pericardial effusion. Mitral Valve: The mitral valve is normal in structure. Tricuspid Valve: The tricuspid valve is normal in structure. Aortic Valve: The aortic valve was not well visualized. There is mild calcification of the aortic valve. Aortic valve sclerosis/calcification is present, without any evidence of aortic stenosis. Pulmonic Valve: The pulmonic valve was not assessed. Aorta: The aortic root is normal in size and  structure. Venous: The inferior vena cava was not well visualized. LEFT VENTRICLE PLAX 2D LVIDd:         3.20 cm LVIDs:         2.10 cm LV PW:         1.00 cm LV IVS:        1.00 cm LVOT diam:     2.00 cm LVOT Area:     3.14 cm  LV Volumes (MOD) LV vol d, MOD A2C: 90.7 ml LV vol d, MOD A4C: 98.8 ml LV vol s, MOD A2C: 38.3 ml LV vol s, MOD A4C: 34.2 ml LV SV MOD A2C:     52.4 ml LV SV MOD A4C:     98.8 ml LV SV MOD BP:      59.5 ml RIGHT VENTRICLE RV Basal diam:  3.50 cm TAPSE (M-mode): 1.5 cm LEFT ATRIUM             Index        RIGHT ATRIUM           Index LA diam:        3.90 cm 1.87 cm/m   RA Area:     14.80 cm LA Vol (A2C):   53.6 ml 25.70 ml/m  RA Volume:   38.70 ml  18.55 ml/m LA Vol (A4C):   37.5 ml 17.98 ml/m LA Biplane Vol: 45.4 ml 21.77 ml/m   AORTA Ao Root diam: 3.70 cm  SHUNTS Systemic Diam: 2.00 cm Toribio Fuel MD Electronically signed by Toribio Fuel MD Signature Date/Time: 06/01/2024/4:40:38 PM    Final    DG Chest Port 1 View Result Date: 06/01/2024 EXAM: 1 VIEW(S) XRAY OF THE CHEST 06/01/2024 06:12:00 AM COMPARISON: 05/31/2024 CLINICAL HISTORY: S/P CABG x  4 ; Hx of bronchitis, hypertension, GERD, pneumonia FINDINGS: LINES, TUBES AND DEVICES: Stable right IJ Swan-Ganz catheter. Stable mediastinal drain and left chest tube. Interval extubation and removal of enteric tube. LUNGS AND PLEURA: Increased left retrocardiac airspace opacity likely atelectasis. Mild right basilar atelectasis. Trace left pleural effusion. No pulmonary edema. No pneumothorax. HEART AND MEDIASTINUM: Post-CABG status. No acute abnormality of the cardiac and mediastinal silhouettes. BONES AND SOFT TISSUES: Intact sternotomy wires. No acute osseous abnormality. IMPRESSION: 1. Interval extubation with increased left retrocardiac airspace opacity, likely atelectasis. Electronically signed by: Ryan Chess MD 06/01/2024 10:21 AM EDT RP Workstation: [id number]   DG Abd Portable 1V Result Date: 05/31/2024 EXAM:  1 VIEW XRAY OF THE ABDOMEN 05/31/2024 02:34:00 PM COMPARISON: None available. CLINICAL HISTORY: Encounter for imaging study to confirm orogastric (OG) tube placement. FINDINGS: LINES, TUBES AND DEVICES: Enteric tube in place with tip in the proximal stomach, side port in the region of the GE Junction. Swan-Ganz Catheter and mediastinal drains noted. Left Chest Tube in place. CABG Markers noted. BOWEL: Nonobstructive bowel gas pattern. SOFT TISSUES: Surgical clips in left upper abdomen. No opaque urinary calculi. BONES: No acute osseous abnormality. LUNGS: Left Lung Base Atelectasis. IMPRESSION: 1. Enteric tube with tip in the proximal stomach and side port at the gastroesophageal junction; consider advancing to ensure side port is intragastric. Electronically signed by: Taylor Stroud MD 05/31/2024 02:58 PM EDT RP Workstation: [id number]     Medications:     Scheduled Medications:  acetaminophen   1,000 mg Oral Q6H   Or   acetaminophen  (TYLENOL ) oral liquid 160 mg/5 mL  1,000 mg Per Tube Q6H   aspirin  EC  325 mg Oral Daily   Or   aspirin   324 mg Per Tube Daily   bisacodyl  10 mg Oral Daily   Or   bisacodyl  10 mg Rectal Daily   Chlorhexidine  Gluconate Cloth  6 each Topical Daily   colchicine  0.6 mg Oral Daily   docusate sodium   200 mg Oral Daily   enoxaparin (LOVENOX) injection  40 mg Subcutaneous QHS   fluticasone   2 spray Each Nare Daily   methimazole   10 mg Oral Daily   metoprolol  tartrate  12.5 mg Oral BID   pantoprazole   40 mg Oral Daily   rosuvastatin   40 mg Oral Daily   sodium chloride  flush  3 mL Intravenous Q12H   tamsulosin   0.4 mg Oral BID   testosterone   5 g Transdermal Daily   umeclidinium-vilanterol  1 puff Inhalation Daily    Infusions:  furosemide (LASIX) 200 mg in dextrose  5 % 100 mL (2 mg/mL) infusion 8 mg/hr (06/02/24 1314)    PRN Medications: ipratropium, metoprolol  tartrate, ondansetron  (ZOFRAN ) IV, oxyCODONE , sodium chloride  flush, traMADol,  traZODone    Assessment/Plan:   Acute HFpEF - Pre-op echo and intra-op echo with normal EF 65-70%, G1DD, and normal RV - Echo 10/24 EF 60-65% RV mild HK no effusion - Co-ox 71% - markedly volume overloaded - lasix gtt as below   NSTEMI  Multivessel CAD - LHC 10/25 with multivessel CAD, culprit lesion 95% distal RCA - s/p CABG x4  (LIMA-LAD, SVG-diag, SVG-OM, SVG-PDA)  - continue ASA  - no s/s angina - on metoprolol  12.5 mg bid   Acute on CKD 3b - baseline Cr 1.6-1.7 - SCr 1.79 -> 2.39 -> 2.78 likely due to post-op ATN - markedly volume overloaded - Start lasix gtt - Await renal recovery  Post-op pericarditis  - on colchcine  Hyponatremia -  limit FW   Length of Stay: 4   Toribio Fuel MD 06/02/2024, 2:04 PM  Advanced Heart Failure Team Pager [phone] (M-F; 7a - 4p)  Please contact [facility] for night-coverage after hours (4p -7a ) and weekends on [email or web address]

## 2024-06-02 NOTE — Progress Notes (Signed)
 2 Days Post-Op Procedure(s) (LRB): CORONARY ARTERY BYPASS GRAFTING (CABG) TIMES FOUR , USING LEFT INTERNAL MAMMARY ARTERY AND GREATER SAPHENOUS VEIN HARVESTED ENDOSCOPICALLY (N/A) ECHOCARDIOGRAM, TRANSESOPHAGEAL, INTRAOPERATIVE (N/A) Subjective: Reports visual disturbances - looks like things that are still and fluttering up and down.  Improves with closing one eye. No other neuro deficits Fair response to IV lasix 20 x 1 and 80 x 1 yesterday but only down 1 lb and up 20 lbs compared to prior to surgery Still on 3-4 L [place] but able to ambulate well this morning CXR looks more wet Cre bump to 2.8 this morning, baseline 1.8-2) TTE yesterday with normal EF, mildly reduced RV  Objective: Vital signs in last 24 hours: BP 120/68 (BP Location: Right Arm)   Pulse 87   Temp 97.8 F (36.6 C) (Oral)   Resp (!) 7   Ht 6' (1.829 m)   Wt 86.9 kg   SpO2 100%   BMI 25.98 kg/m  Filed Weights   05/31/24 0637 06/01/24 0500 06/02/24 0500  Weight: (P) 74.8 kg 86.3 kg 86.9 kg    Hemodynamic parameters for last 24 hours: CVP:  [0 mmHg-42 mmHg] 8 mmHg  Intake/Output from previous day: 10/24 0701 - 10/25 0700 In: 1028.2 [I.V.:481.4; IV Piggyback:546.8] Out: 2115 [Lmpwz:8534; Chest Tube:650] Intake/Output this shift: Total I/O In: 240 [P.O.:240] Out: 300 [Urine:200; Chest Tube:100]  Physical Exam: General - Resting comfortably in bed CV - RRR Resp - Unlabored on 3L [place], diminished at both bases Abd - Soft, ND/NT Ext - moderate upper and mild lower extremity edema  Lab Results:    Latest Ref Rng & Units 06/02/2024    4:56 AM 06/01/2024    6:38 PM 06/01/2024    4:43 AM  CBC  WBC 4.0 - 10.5 K/uL 12.2  14.1  14.9   Hemoglobin 13.0 - 17.0 g/dL 7.9  8.6  8.7   Hematocrit 39.0 - 52.0 % 24.6  26.4  26.7   Platelets 150 - 400 K/uL 169  189  209       Latest Ref Rng & Units 06/02/2024    4:56 AM 06/01/2024    6:38 PM 06/01/2024    4:43 AM  CMP  Glucose 70 - 99 mg/dL 885  884  881   BUN 8 -  23 mg/dL 31  25  19    Creatinine 0.61 - 1.24 mg/dL 7.21  7.60  8.20   Sodium 135 - 145 mmol/L 126  131  132   Potassium 3.5 - 5.1 mmol/L 4.7  4.6  5.5   Chloride 98 - 111 mmol/L 97  102  105   CO2 22 - 32 mmol/L 21  19  19    Calcium  8.9 - 10.3 mg/dL 7.4  7.5  7.3     CXR: Increase pulmonary edema  Assessment/Plan: S/P Procedure(s) (LRB): CORONARY ARTERY BYPASS GRAFTING (CABG) TIMES FOUR , USING LEFT INTERNAL MAMMARY ARTERY AND GREATER SAPHENOUS VEIN HARVESTED ENDOSCOPICALLY (N/A) ECHOCARDIOGRAM, TRANSESOPHAGEAL, INTRAOPERATIVE (N/A) POD2 s/p CABG with flash pulmonary edema on POD1 NEURO- visual disturbances present since surgery, a little worse yesterday -> non-con head CT to rule out stroke and will scale back pain medications and polypharmacy  Pain control PRN CV- in SR around 80s bpm             Remove pacing wires today RESP- Subjectively improved, CXR still looks wet             Continue IS, pulm hygiene, ambulation  Keep Chest tubes  for today RENAL- creatinine bump to 2.8, lytes Ok  Weight up at least 20 lbs from surgery             Start IV 80 x 1 lasix and lasix gtt per HF team  Foley will stay while diuresing GI- tolerating regular diet             Advance diet  BM: Work on BM Endo- BG well controlled Stop ISS today, no history of DM ID- NAI DVT ppx - SCD + Lovenox  Dispo: ICU   LOS: 4 days    Con RAMAN Modest Draeger 06/02/2024

## 2024-06-03 ENCOUNTER — Inpatient Hospital Stay (HOSPITAL_COMMUNITY)

## 2024-06-03 DIAGNOSIS — I214 Non-ST elevation (NSTEMI) myocardial infarction: Secondary | ICD-10-CM | POA: Diagnosis not present

## 2024-06-03 LAB — BPAM RBC
Blood Product Expiration Date: 202511182359
Blood Product Expiration Date: 202511182359
Unit Type and Rh: 5100
Unit Type and Rh: 5100

## 2024-06-03 LAB — BASIC METABOLIC PANEL WITH GFR
Anion gap: 10 (ref 5–15)
Anion gap: 9 (ref 5–15)
BUN: 40 mg/dL — ABNORMAL HIGH (ref 8–23)
BUN: 42 mg/dL — ABNORMAL HIGH (ref 8–23)
CO2: 23 mmol/L (ref 22–32)
CO2: 24 mmol/L (ref 22–32)
Calcium: 7.8 mg/dL — ABNORMAL LOW (ref 8.9–10.3)
Calcium: 7.8 mg/dL — ABNORMAL LOW (ref 8.9–10.3)
Chloride: 100 mmol/L (ref 98–111)
Chloride: 95 mmol/L — ABNORMAL LOW (ref 98–111)
Creatinine, Ser: 2.65 mg/dL — ABNORMAL HIGH (ref 0.61–1.24)
Creatinine, Ser: 2.52 mg/dL — ABNORMAL HIGH (ref 0.61–1.24)
GFR, Estimated: 25 mL/min — ABNORMAL LOW (ref >60)
GFR, Estimated: 27 mL/min — ABNORMAL LOW (ref >60)
Glucose, Bld: 119 mg/dL — ABNORMAL HIGH (ref 70–99)
Glucose, Bld: 238 mg/dL — ABNORMAL HIGH (ref 70–99)
Potassium: 3.4 mmol/L — ABNORMAL LOW (ref 3.5–5.1)
Potassium: 3.5 mmol/L (ref 3.5–5.1)
Sodium: 133 mmol/L — ABNORMAL LOW (ref 135–145)
Sodium: 128 mmol/L — ABNORMAL LOW (ref 135–145)

## 2024-06-03 LAB — CBC
HCT: 24.5 % — ABNORMAL LOW (ref 39.0–52.0)
Hemoglobin: 8.3 g/dL — ABNORMAL LOW (ref 13.0–17.0)
MCH: 30.7 pg (ref 26.0–34.0)
MCHC: 33.9 g/dL (ref 30.0–36.0)
MCV: 90.7 fL (ref 80.0–100.0)
Platelets: 216 K/uL (ref 150–400)
RBC: 2.7 MIL/uL — ABNORMAL LOW (ref 4.22–5.81)
RDW: 14.3 % (ref 11.5–15.5)
WBC: 11.3 K/uL — ABNORMAL HIGH (ref 4.0–10.5)
nRBC: 0 % (ref 0.0–0.2)

## 2024-06-03 LAB — TYPE AND SCREEN
ABO/RH(D): O POS
Antibody Screen: NEGATIVE
Unit division: 0
Unit division: 0

## 2024-06-03 LAB — GLUCOSE, CAPILLARY: Glucose-Capillary: 122 mg/dL — ABNORMAL HIGH (ref 70–99)

## 2024-06-03 MED ORDER — METOPROLOL TARTRATE 25 MG PO TABS
25.0000 mg | ORAL_TABLET | Freq: Two times a day (BID) | ORAL | Status: DC
  Administered 2024-06-03: 25 mg via ORAL
  Filled 2024-06-03: qty 1

## 2024-06-03 MED ORDER — POTASSIUM CHLORIDE CRYS ER 20 MEQ PO TBCR: 40.0000 meq | EXTENDED_RELEASE_TABLET | Freq: Two times a day (BID) | ORAL | Status: DC

## 2024-06-03 MED ORDER — POTASSIUM CHLORIDE CRYS ER 20 MEQ PO TBCR
40.0000 meq | EXTENDED_RELEASE_TABLET | Freq: Two times a day (BID) | ORAL | Status: AC
  Administered 2024-06-03 (×2): 40 meq via ORAL
  Filled 2024-06-03 (×2): qty 2

## 2024-06-03 MED ORDER — METOPROLOL TARTRATE 25 MG PO TABS
37.5000 mg | ORAL_TABLET | Freq: Two times a day (BID) | ORAL | Status: DC
  Administered 2024-06-03: 37.5 mg via ORAL
  Filled 2024-06-03: qty 1

## 2024-06-03 MED ORDER — SORBITOL 70 % SOLN
30.0000 mL | Freq: Once | Status: AC
  Administered 2024-06-03: 30 mL via ORAL

## 2024-06-03 NOTE — Plan of Care (Signed)
  Problem: Activity: Goal: Risk for activity intolerance will decrease Outcome: Progressing   Problem: Cardiac: Goal: Will achieve and/or maintain hemodynamic stability Outcome: Progressing   Problem: Clinical Measurements: Goal: Postoperative complications will be avoided or minimized Outcome: Progressing   Problem: Respiratory: Goal: Respiratory status will improve Outcome: Progressing   Problem: Skin Integrity: Goal: Wound healing without signs and symptoms of infection Outcome: Progressing   Problem: Urinary Elimination: Goal: Ability to achieve and maintain adequate renal perfusion and functioning will improve Outcome: Progressing   

## 2024-06-03 NOTE — Progress Notes (Signed)
 Advanced Heart Failure Rounding Note   Subjective:    Diuresing well on lasix gtt. Out 5L. Feeling much better.   Walking halls.   SCr 1.79 -> 2.39 -> 2.78 -> 2.65  Echo EF 60-65 RV mild HK   Objective:   Weight Range:  Vital Signs:   Temp:  [98.2 F (36.8 C)-100.6 F (38.1 C)] 98.6 F (37 C) (10/26 0800) Pulse Rate:  [91-116] 105 (10/26 1100) Resp:  [10-23] 21 (10/26 1100) BP: (105-149)/(57-91) 149/91 (10/26 1100) SpO2:  [64 %-100 %] 97 % (10/26 1100) Weight:  [82.8 kg] 82.8 kg (10/26 0600) Last BM Date : 05/27/24  Weight change: Filed Weights   06/01/24 0500 06/02/24 0500 06/03/24 0600  Weight: 86.3 kg 86.9 kg 82.8 kg    Intake/Output:   Intake/Output Summary (Last 24 hours) at 06/03/2024 1233 Last data filed at 06/03/2024 1100 Gross per 24 hour  Intake 928.54 ml  Output 6390 ml  Net -5461.46 ml     Physical Exam: General:  Sitting up in bed No resp difficulty HEENT: normal Neck: supple.JVP 8-10 Cor: Regular rate & rhythm. No rubs, gallops or murmurs. Lungs: clear Abdomen: soft, nontender, nondistended.Good bowel sounds. Extremities: no cyanosis, clubbing, rash, tr edema Neuro: alert & orientedx3, cranial nerves grossly intact. moves all 4 extremities w/o difficulty. Affect pleasant   Telemetry: sinus 95-105 Personally reviewed   Labs: Basic Metabolic Panel: Recent Labs  Lab 05/29/24 0403 05/29/24 1026 05/30/24 0456 05/31/24 0415 05/31/24 1901 06/01/24 0443 06/01/24 1838 06/02/24 0456 06/02/24 1504 06/03/24 0545  NA 138   < > 138   < > 138 132* 131* 126* 130* 133*  K 4.5   < > 4.7   < > 5.5* 5.5* 4.6 4.7 4.2 3.4*  CL 105   < > 110   < > 110 105 102 97* 100 100  CO2 24   < > 21*   < > 20* 19* 19* 21* 22 23  GLUCOSE 120*   < > 96   < > 148* 118* 115* 114* 136* 119*  BUN 33*   < > 21   < > 19 19 25* 31* 35* 40*  CREATININE 2.31*   < > 1.80*   < > 1.72* 1.79* 2.39* 2.78* 2.69* 2.65*  CALCIUM  8.8*   < > 8.4*   < > 7.6* 7.3* 7.5* 7.4*  7.8* 7.8*  MG 2.3  --  2.1  --  3.2* 2.8* 2.7*  --   --   --    < > = values in this interval not displayed.    Liver Function Tests: No results for input(s): AST, ALT, ALKPHOS, BILITOT, PROT, ALBUMIN  in the last 168 hours. No results for input(s): LIPASE, AMYLASE in the last 168 hours. No results for input(s): AMMONIA in the last 168 hours.  CBC: Recent Labs  Lab 05/31/24 1901 06/01/24 0443 06/01/24 1838 06/02/24 0456 06/03/24 0545  WBC 14.9* 14.9* 14.1* 12.2* 11.3*  HGB 9.6* 8.7* 8.6* 7.9* 8.3*  HCT 29.8* 26.7* 26.4* 24.6* 24.5*  MCV 94.3 93.7 95.3 95.0 90.7  PLT 249 209 189 169 216    Cardiac Enzymes: No results for input(s): CKTOTAL, CKMB, CKMBINDEX, TROPONINI in the last 168 hours.  BNP: BNP (last 3 results) No results for input(s): BNP in the last 8760 hours.  ProBNP (last 3 results) No results for input(s): PROBNP in the last 8760 hours.    Other results:  Imaging: DG CHEST PORT 1 VIEW Result Date:  06/03/2024 EXAM: 1 VIEW(S) XRAY OF THE CHEST 06/03/2024 07:21:00 AM COMPARISON: 06/02/2024 CLINICAL HISTORY: Status post cardiac surgery FINDINGS: LINES, TUBES AND DEVICES: Stable right IJ central venous catheter with tip overlying upper portion of superior vena cava. Stable mediastinal drain and left chest tube. LUNGS AND PLEURA: Low lung volume. Mild pulmonary edema. Bibasilar opacities, likely representing combination of atelectasis and/or consolidation. Bilateral small layering pleural effusions. No pneumothorax. HEART AND MEDIASTINUM: No acute abnormality of the cardiac and mediastinal silhouettes. BONES AND SOFT TISSUES: Sternotomy wires noted. IMPRESSION: 1. Mild pulmonary edema with bibasilar opacities that may reflect atelectasis and/or consolidation. 2. Bilateral pleural effusions, right greater than left. Electronically signed by: Waddell Calk MD 06/03/2024 07:46 AM EDT RP Workstation: [id number]   CT HEAD WO CONTRAST  ( ) Result Date: 06/02/2024 EXAM: CT HEAD WITHOUT CONTRAST 06/02/2024 11:20:43 AM TECHNIQUE: CT of the head was performed without the administration of intravenous contrast. Automated exposure control, iterative reconstruction, and/or weight based adjustment of the mA/kV was utilized to reduce the radiation dose to as low as reasonably achievable. COMPARISON: Prior head CT 06/26/2023. CLINICAL HISTORY: 73 year old male. Vision changes after cardiac surgery. FINDINGS: BRAIN AND VENTRICLES: No acute hemorrhage. No evidence of acute infarct. No hydrocephalus. No extra-axial collection. No mass effect or midline shift. Brain volume is stable, with minimal limits for age. Gray white differentiation stable and with minimal limits, including in the occipital lobes. No suspicious intracranial vascular hyperdensity. Mild for age calcified atherosclerosis. ORBITS: No acute abnormality. SINUSES: No acute abnormality. SOFT TISSUES AND SKULL: Soft tissues appear stable and normal. No skull fracture. IMPRESSION: 1. Stable and normal for age non-contrast head CT. Electronically signed by: Helayne Hurst MD 06/02/2024 11:36 AM EDT RP Workstation: [id number]   DG Chest Port 1 View Result Date: 06/02/2024 EXAM: 1 VIEW XRAY OF THE CHEST 06/02/2024 06:15:54 AM COMPARISON: 06/01/2024 CLINICAL HISTORY: [id number] S/P CABG (coronary artery bypass graft) [id number]. [id number] S/P CABG (coronary artery bypass graft) [id number] FINDINGS: LINES, TUBES AND DEVICES: Stable right IJ sheath. Mediastinal drain and left chest tube in place. LUNGS AND PLEURA: Small bilateral pleural effusions. Mild pulmonary edema. Increased left basilar opacities. HEART AND MEDIASTINUM: CABG. No acute abnormality of the cardiac and mediastinal silhouettes. BONES AND SOFT TISSUES: Post median sternotomy. IMPRESSION: 1. Mild pulmonary edema with increased left basilar opacities, which may reflect atelectasis or aspiration versus developing infection. 2. Small bilateral pleural  effusions. 3. Right IJ sheath, mediastinal drain, and left chest tube are in place and unchanged. Electronically signed by: Waddell Calk MD 06/02/2024 07:33 AM EDT RP Workstation: [id number]   ECHOCARDIOGRAM LIMITED Result Date: 06/01/2024    ECHOCARDIOGRAM LIMITED REPORT   Patient Name:   Victor Price Date of Exam: 06/01/2024 Medical Rec #:  [id number]      Height:       72.0 in Accession #:    [id number]     Weight:       190.3 lb Date of Birth:  [birth year]       BSA:          2.086 m Patient Age:    73 years       BP:           108/75 mmHg Patient Gender: M              HR:           94 bpm. Exam Location:  Inpatient Procedure: 2D Echo and Limited Echo (Both Spectral and Color Flow Doppler were  utilized during procedure). Indications:    CHF I50.9  History:        Patient has prior history of Echocardiogram examinations, most                 recent 05/28/2024. Risk Factors:Hypertension. NSTEMI, CABG,                 Precordial chest pain, Hyperlipidemia.  Sonographer:    BERNARDA ROCKS Referring Phys: [id number] JORDAN LEE  Sonographer Comments: Technically difficult due to S/P CABG. IMPRESSIONS  1. Left ventricular ejection fraction, by estimation, is 60 to 65%. The left ventricle has normal function.  2. Right ventricular systolic function is mildly reduced. The right ventricular size is normal.  3. The mitral valve is normal in structure. No evidence of mitral valve regurgitation.  4. The aortic valve was not well visualized. There is mild calcification of the aortic valve. Aortic valve sclerosis/calcification is present, without any evidence of aortic stenosis. FINDINGS  Left Ventricle: Left ventricular ejection fraction, by estimation, is 60 to 65%. The left ventricle has normal function. Right Ventricle: The right ventricular size is normal. No increase in right ventricular wall thickness. Right ventricular systolic function is mildly reduced. Left Atrium: Left atrial size was normal in size.  Right Atrium: Right atrial size was normal in size. Pericardium: There is no evidence of pericardial effusion. Mitral Valve: The mitral valve is normal in structure. Tricuspid Valve: The tricuspid valve is normal in structure. Aortic Valve: The aortic valve was not well visualized. There is mild calcification of the aortic valve. Aortic valve sclerosis/calcification is present, without any evidence of aortic stenosis. Pulmonic Valve: The pulmonic valve was not assessed. Aorta: The aortic root is normal in size and structure. Venous: The inferior vena cava was not well visualized. LEFT VENTRICLE PLAX 2D LVIDd:         3.20 cm LVIDs:         2.10 cm LV PW:         1.00 cm LV IVS:        1.00 cm LVOT diam:     2.00 cm LVOT Area:     3.14 cm  LV Volumes (MOD) LV vol d, MOD A2C: 90.7 ml LV vol d, MOD A4C: 98.8 ml LV vol s, MOD A2C: 38.3 ml LV vol s, MOD A4C: 34.2 ml LV SV MOD A2C:     52.4 ml LV SV MOD A4C:     98.8 ml LV SV MOD BP:      59.5 ml RIGHT VENTRICLE RV Basal diam:  3.50 cm TAPSE (M-mode): 1.5 cm LEFT ATRIUM             Index        RIGHT ATRIUM           Index LA diam:        3.90 cm 1.87 cm/m   RA Area:     14.80 cm LA Vol (A2C):   53.6 ml 25.70 ml/m  RA Volume:   38.70 ml  18.55 ml/m LA Vol (A4C):   37.5 ml 17.98 ml/m LA Biplane Vol: 45.4 ml 21.77 ml/m   AORTA Ao Root diam: 3.70 cm  SHUNTS Systemic Diam: 2.00 cm Toribio Fuel MD Electronically signed by Toribio Fuel MD Signature Date/Time: 06/01/2024/4:40:38 PM    Final      Medications:     Scheduled Medications:  acetaminophen   1,000 mg Oral Q6H   Or   acetaminophen  (TYLENOL ) oral liquid  160 mg/5 mL  1,000 mg Per Tube Q6H   aspirin  EC  325 mg Oral Daily   Or   aspirin   324 mg Per Tube Daily   bisacodyl  10 mg Oral Daily   Or   bisacodyl  10 mg Rectal Daily   Chlorhexidine  Gluconate Cloth  6 each Topical Daily   colchicine  0.6 mg Oral Daily   docusate sodium   200 mg Oral Daily   enoxaparin (LOVENOX) injection  30 mg  Subcutaneous QHS   fluticasone   2 spray Each Nare Daily   methimazole   10 mg Oral Daily   metoprolol  tartrate  25 mg Oral BID   pantoprazole   40 mg Oral Daily   potassium chloride  40 mEq Oral BID   rosuvastatin   40 mg Oral Daily   sodium chloride  flush  3 mL Intravenous Q12H   sorbitol  30 mL Oral Once   tamsulosin   0.4 mg Oral BID   testosterone   5 g Transdermal Daily   umeclidinium-vilanterol  1 puff Inhalation Daily    Infusions:  furosemide (LASIX) 200 mg in dextrose  5 % 100 mL (2 mg/mL) infusion 8 mg/hr (06/03/24 1100)    PRN Medications: ipratropium, metoprolol  tartrate, ondansetron  (ZOFRAN ) IV, oxyCODONE , sodium chloride  flush, traMADol, traZODone    Assessment/Plan:   Acute HFpEF - Pre-op echo and intra-op echo with normal EF 65-70%, G1DD, and normal RV - Echo 10/24 EF 60-65% RV mild HK no effusion - Volume status much better. Still 12 pounds up. Continue lasix gtt one more day. D/w Dr. Hitesh Fouche     NSTEMI  Multivessel CAD - LHC 10/25 with multivessel CAD, culprit lesion 95% distal RCA - s/p CABG x4  (LIMA-LAD, SVG-diag, SVG-OM, SVG-PDA)  - continue ASA  - no s/s angina - on metoprolol  12.5 mg bid   Acute on CKD 3b likely post-op ATN - baseline Cr 1.6-1.7 - SCr 1.79 -> 2.39 -> 2.78 -> 2.65 likely due to post-op ATN - Diuresing well. Renal function improving. Follow BMET  Post-op pericarditis  - on colchcine  Hyponatremia - limit FW   Length of Stay: 5   Toribio Fuel MD 06/03/2024, 12:33 PM  Advanced Heart Failure Team Pager [phone] (M-F; 7a - 4p)  Please contact [facility] for night-coverage after hours (4p -7a ) and weekends on [email or web address]

## 2024-06-03 NOTE — Progress Notes (Signed)
 TCTS PM Rounding Progress Note  Doing well this evening.  Walked a couple times, diuresing well. Lasix gtt off now, kidney function stable.  Room air all day.  Vitals:   06/03/24 1600 06/03/24 1626  BP: 120/74   Pulse: (!) 106 (!) 111  Resp: 18 18  Temp:    SpO2: 94% 94%    Plan: - Remove central line - Foley probably out in the morning  Con Clunes, MD Cardiothoracic Surgery Pager: [phone]

## 2024-06-03 NOTE — Progress Notes (Signed)
 3 Days Post-Op Procedure(s) (LRB): CORONARY ARTERY BYPASS GRAFTING (CABG) TIMES FOUR , USING LEFT INTERNAL MAMMARY ARTERY AND GREATER SAPHENOUS VEIN HARVESTED ENDOSCOPICALLY (N/A) ECHOCARDIOGRAM, TRANSESOPHAGEAL, INTRAOPERATIVE (N/A) Subjective: Vision changes almost completely resolved Excellent response to lasix gtt - net negative 5L yesterday Off oxygen Weight down 9 lbs Cre elevated but stable  Objective: Vital signs in last 24 hours: BP 128/69   Pulse (!) 101   Temp 100.2 F (37.9 C)   Resp 12   Ht 6' (1.829 m)   Wt 82.8 kg   SpO2 94%   BMI 24.76 kg/m  Filed Weights   06/01/24 0500 06/02/24 0500 06/03/24 0600  Weight: 86.3 kg 86.9 kg 82.8 kg    Hemodynamic parameters for last 24 hours: CVP:  [0 mmHg-12 mmHg] 1 mmHg  Intake/Output from previous day: 10/25 0701 - 10/26 0700 In: 912.7 [P.O.:840; I.V.:72.7] Out: 6040 [Urine:5760; Chest Tube:280] Intake/Output this shift: No intake/output data recorded.  Physical Exam: General - Resting comfortably in chair CV - RRR Resp - Unlabored on RA, diminished at both bases Abd - Soft, ND/NT Ext - Mild upper and mild lower extremity edema  Lab Results:    Latest Ref Rng & Units 06/03/2024    5:45 AM 06/02/2024    4:56 AM 06/01/2024    6:38 PM  CBC  WBC 4.0 - 10.5 K/uL 11.3  12.2  14.1   Hemoglobin 13.0 - 17.0 g/dL 8.3  7.9  8.6   Hematocrit 39.0 - 52.0 % 24.5  24.6  26.4   Platelets 150 - 400 K/uL 216  169  189       Latest Ref Rng & Units 06/03/2024    5:45 AM 06/02/2024    3:04 PM 06/02/2024    4:56 AM  CMP  Glucose 70 - 99 mg/dL 880  863  885   BUN 8 - 23 mg/dL 40  35  31   Creatinine 0.61 - 1.24 mg/dL 7.34  7.30  7.21   Sodium 135 - 145 mmol/L 133  130  126   Potassium 3.5 - 5.1 mmol/L 3.4  4.2  4.7   Chloride 98 - 111 mmol/L 100  100  97   CO2 22 - 32 mmol/L 23  22  21    Calcium  8.9 - 10.3 mg/dL 7.8  7.8  7.4     CXR: Decreased pulmonary edema  Assessment/Plan: S/P Procedure(s) (LRB): CORONARY  ARTERY BYPASS GRAFTING (CABG) TIMES FOUR , USING LEFT INTERNAL MAMMARY ARTERY AND GREATER SAPHENOUS VEIN HARVESTED ENDOSCOPICALLY (N/A) ECHOCARDIOGRAM, TRANSESOPHAGEAL, INTRAOPERATIVE (N/A) POD3 s/p CABG with flash pulmonary edema on POD1 NEURO- had some visual changes after surgery, head CT negative, improving  Pain control PRN CV- in SR around 90s bpm          Increase metop for sinus tachycardia  Remove internal jugular central line RESP- Subjectively improved, CXR is improving             Continue IS, pulm hygiene, ambulation  Chest tubes out today RENAL- creatinine elevated to 2.6, stable  K 3.4 - will replete and recheck BMP at 3 pm today  Lasix gtt at 8 - still 12 lbs up from pre-op  Foley will stay while diuresing GI- tolerating regular diet             [facility] Endo- BG well controlled Stop ISS today, no history of DM ID- NAI DVT ppx - SCD + Lovenox  Dispo: ICU   LOS: 5 days  Con RAMAN Jillisa Harris 06/03/2024

## 2024-06-03 NOTE — Progress Notes (Signed)
Courtenay RN aware of order to d/c CVC. 

## 2024-06-04 ENCOUNTER — Inpatient Hospital Stay (HOSPITAL_COMMUNITY)

## 2024-06-04 DIAGNOSIS — I214 Non-ST elevation (NSTEMI) myocardial infarction: Secondary | ICD-10-CM | POA: Diagnosis not present

## 2024-06-04 LAB — BASIC METABOLIC PANEL WITH GFR
Anion gap: 11 (ref 5–15)
BUN: 45 mg/dL — ABNORMAL HIGH (ref 8–23)
CO2: 26 mmol/L (ref 22–32)
Calcium: 8.5 mg/dL — ABNORMAL LOW (ref 8.9–10.3)
Chloride: 99 mmol/L (ref 98–111)
Creatinine, Ser: 2.49 mg/dL — ABNORMAL HIGH (ref 0.61–1.24)
GFR, Estimated: 27 mL/min — ABNORMAL LOW (ref >60)
Glucose, Bld: 106 mg/dL — ABNORMAL HIGH (ref 70–99)
Potassium: 4.2 mmol/L (ref 3.5–5.1)
Sodium: 136 mmol/L (ref 135–145)

## 2024-06-04 LAB — CBC
HCT: 28.5 % — ABNORMAL LOW (ref 39.0–52.0)
Hemoglobin: 9.6 g/dL — ABNORMAL LOW (ref 13.0–17.0)
MCH: 30.2 pg (ref 26.0–34.0)
MCHC: 33.7 g/dL (ref 30.0–36.0)
MCV: 89.6 fL (ref 80.0–100.0)
Platelets: 327 K/uL (ref 150–400)
RBC: 3.18 MIL/uL — ABNORMAL LOW (ref 4.22–5.81)
RDW: 14 % (ref 11.5–15.5)
WBC: 9.7 K/uL (ref 4.0–10.5)
nRBC: 0 % (ref 0.0–0.2)

## 2024-06-04 LAB — GLUCOSE, CAPILLARY
Glucose-Capillary: 102 mg/dL — ABNORMAL HIGH (ref 70–99)
Glucose-Capillary: 114 mg/dL — ABNORMAL HIGH (ref 70–99)

## 2024-06-04 MED ORDER — ATENOLOL 50 MG PO TABS
50.0000 mg | ORAL_TABLET | Freq: Every day | ORAL | Status: DC
  Filled 2024-06-04: qty 1

## 2024-06-04 MED ORDER — CLOPIDOGREL BISULFATE 75 MG PO TABS
75.0000 mg | ORAL_TABLET | Freq: Every day | ORAL | Status: DC
Start: 2024-06-04 — End: 2024-06-05
  Administered 2024-06-04 – 2024-06-05 (×2): 75 mg via ORAL
  Filled 2024-06-04 (×2): qty 1

## 2024-06-04 MED ORDER — AMIODARONE LOAD VIA INFUSION
150.0000 mg | Freq: Once | INTRAVENOUS | Status: AC
  Administered 2024-06-04: 150 mg via INTRAVENOUS
  Filled 2024-06-04: qty 83.34

## 2024-06-04 MED ORDER — FENTANYL CITRATE (PF) 50 MCG/ML IJ SOSY: 25.0000 ug | PREFILLED_SYRINGE | Freq: Once | INTRAMUSCULAR | Status: DC

## 2024-06-04 MED ORDER — SODIUM CHLORIDE 0.9 % IV SOLN: 250.0000 mL | INTRAVENOUS | Status: AC | PRN

## 2024-06-04 MED ORDER — ALUM & MAG HYDROXIDE-SIMETH 200-200-20 MG/5ML PO SUSP: 15.0000 mL | Freq: Four times a day (QID) | ORAL | Status: DC | PRN

## 2024-06-04 MED ORDER — SODIUM CHLORIDE 0.9% FLUSH
3.0000 mL | Freq: Two times a day (BID) | INTRAVENOUS | Status: DC
  Administered 2024-06-05: 3 mL via INTRAVENOUS

## 2024-06-04 MED ORDER — METOPROLOL SUCCINATE ER 25 MG PO TB24
25.0000 mg | ORAL_TABLET | Freq: Every day | ORAL | Status: DC
  Administered 2024-06-04: 25 mg via ORAL
  Filled 2024-06-04: qty 1

## 2024-06-04 MED ORDER — INSULIN ASPART 100 UNIT/ML IJ SOLN: 0.0000 [IU] | Freq: Three times a day (TID) | INTRAMUSCULAR | Status: DC

## 2024-06-04 MED ORDER — AMIODARONE IV BOLUS ONLY 150 MG/100ML
150.0000 mg | Freq: Once | INTRAVENOUS | Status: AC
  Administered 2024-06-04: 150 mg via INTRAVENOUS

## 2024-06-04 MED ORDER — AMIODARONE LOAD VIA INFUSION
150.0000 mg | Freq: Once | INTRAVENOUS | Status: DC
  Filled 2024-06-04: qty 83.34

## 2024-06-04 MED ORDER — ASPIRIN 81 MG PO TBEC
81.0000 mg | DELAYED_RELEASE_TABLET | Freq: Every day | ORAL | Status: DC
  Administered 2024-06-05: 81 mg via ORAL
  Filled 2024-06-04: qty 1

## 2024-06-04 MED ORDER — ASPIRIN 81 MG PO CHEW: 324.0000 mg | CHEWABLE_TABLET | Freq: Every day | ORAL | Status: DC

## 2024-06-04 MED ORDER — ~~LOC~~ CARDIAC SURGERY, PATIENT & FAMILY EDUCATION: Freq: Once | Status: AC

## 2024-06-04 MED ORDER — AMIODARONE HCL IN DEXTROSE 360-4.14 MG/200ML-% IV SOLN
INTRAVENOUS | Status: AC
  Administered 2024-06-04: 60 mg/h via INTRAVENOUS
  Filled 2024-06-04: qty 200

## 2024-06-04 MED ORDER — SODIUM CHLORIDE 0.9% FLUSH: 3.0000 mL | INTRAVENOUS | Status: DC | PRN

## 2024-06-04 MED ORDER — AMIODARONE HCL IN DEXTROSE 360-4.14 MG/200ML-% IV SOLN
60.0000 mg/h | INTRAVENOUS | Status: AC
  Administered 2024-06-04 (×2): 60 mg/h via INTRAVENOUS

## 2024-06-04 MED ORDER — AMIODARONE HCL IN DEXTROSE 360-4.14 MG/200ML-% IV SOLN
30.0000 mg/h | INTRAVENOUS | Status: DC
  Administered 2024-06-05: 30 mg/h via INTRAVENOUS
  Filled 2024-06-04 (×2): qty 200

## 2024-06-04 MED ORDER — AMIODARONE HCL IN DEXTROSE 360-4.14 MG/200ML-% IV SOLN
INTRAVENOUS | Status: AC
  Filled 2024-06-04: qty 200

## 2024-06-04 NOTE — TOC Initial Note (Signed)
 Transition of Care [facility]) - Initial/Assessment Note    Patient Details  Name: Victor Price MRN: [id number] Date of Birth: November 27, 1950  [facility] CM/SW Contact:    Justina Delcia Czar, RN Phone Number: [phone] 06/04/2024, 12:14 PM  Clinical Narrative:            Spoke to pt at bedside. States he was independent pta. Wife at home to assist and adult dtr at home to assist with care.  Offered choice for [facility]. Pt agreeable to [facility] for HH. States he does not feel it will be needed. Will continue to follow for dc needs.   Medicare.gov list with ratings placed on chart.         Expected Discharge Plan: Home w Home Health Services Barriers to Discharge: Continued Medical Work up   Patient Goals and CMS Choice Patient states their goals for this hospitalization and ongoing recovery are:: wants to remain independent CMS Medicare.gov Compare Post Acute Care list provided to:: Patient Choice offered to / list presented to : Patient      Expected Discharge Plan and Services   Discharge Planning Services: CM Consult Post Acute Care Choice: Home Health Living arrangements for the past 2 months: Single Family Home                           HH Arranged: RN, PT [facility] Agency: [facility]        Prior Living Arrangements/Services Living arrangements for the past 2 months: Single Family Home Lives with:: Spouse, Adult Children Patient language and need for interpreter reviewed:: Yes Do you feel safe going back to the place where you live?: Yes      Need for Family Participation in Patient Care: No (Comment) Care giver support system in place?: Yes (comment)   Criminal Activity/Legal Involvement Pertinent to Current Situation/Hospitalization: No - Comment as needed  Activities of Daily Living   ADL Screening (condition at time of admission) Independently performs ADLs?: Yes (appropriate for developmental age) Is the patient deaf or have  difficulty hearing?: No Does the patient have difficulty seeing, even when wearing glasses/contacts?: No Does the patient have difficulty concentrating, remembering, or making decisions?: No  Permission Sought/Granted Permission sought to share information with : Case Manager, Family Supports, PCP Permission granted to share information with : Yes, Verbal Permission Granted  Share Information with NAME: Victor Price  Permission granted to share info w AGENCY: Home Health, PCP, DME  Permission granted to share info w Relationship: wife  Permission granted to share info w Contact Information: [phone]  Emotional Assessment Appearance:: Appears stated age Attitude/Demeanor/Rapport: Engaged Affect (typically observed): Accepting Orientation: : Oriented to Self, Oriented to Place, Oriented to  Time, Oriented to Situation   Psych Involvement: No (comment)  Admission diagnosis:  Precordial chest pain [R07.2] NSTEMI (non-ST elevated myocardial infarction) [facility]) [I21.4] Patient Active Problem List   Diagnosis Date Noted   S/P CABG x 4 05/31/2024   Hyperthyroidism 05/27/2024   History of Graves' disease 05/27/2024   Prediabetes 05/27/2024   NSTEMI (non-ST elevated myocardial infarction) (HCC) 05/26/2024   Precordial chest pain 05/26/2024   Gustatory Rhinorrhea 01/06/2024   S/P total right hip arthroplasty 08/26/2023   Pain in right hip 04/19/2023   IFG (impaired fasting glucose) 02/01/2023   Hemorrhoid 11/04/2022   Cystic acne 11/04/2022   Left inguinal hernia 01/27/2022   Carpal tunnel syndrome, left upper limb 12/04/2019  H/O total hip arthroplasty, left 10/19/2019   DDD (degenerative disc disease), lumbar 06/05/2019   Hypothyroidism 10/31/2017   Hypogonadism in male 10/31/2017   CKD stage 3b, GFR 30-44 ml/min (HCC) 02/08/2017   Bronchiectasis (HCC) 07/20/2016   Radiculitis of right cervical region 08/28/2014   DDD (degenerative disc disease), cervical 08/28/2014    Insomnia 04/30/2014   Peripheral neuropathy 04/02/2013   Herniated disc 01/29/2013   BPH (benign prostatic hyperplasia) 12/14/2011   ERECTILE DYSFUNCTION, ORGANIC 07/23/2010   THYROID  NODULE, LEFT 05/23/2007   Hyperlipidemia 05/17/2006   Essential hypertension 05/17/2006   PCP:  [name], MD Pharmacy:   [phone] - [place], [place] - [address] [place] [address] Phone: [phone] Fax: [phone]  [id number] [place], [place] - [address] AT [facility] [address] [place] [address] Phone: [phone] Fax: [phone]  [facility] [id number] - [place], [place] - [address] [place] [address] Phone: [phone] Fax: [phone]     Social Drivers of Health (SDOH) Social History: SDOH Screenings   Food Insecurity: No Food Insecurity (05/27/2024)  Housing: Low Risk  (05/27/2024)  Transportation Needs: No Transportation Needs (05/27/2024)  Utilities: Not At Risk (05/27/2024)  Alcohol Screen: Low Risk  (01/03/2024)  Depression (PHQ2-9): Low Risk  (05/08/2024)  Financial Resource Strain: Low Risk  (04/13/2024)   Received from [facility]  Physical Activity: Sufficiently Active (04/13/2024)   Received from [facility]  Social Connections: Moderately Integrated (05/27/2024)  Stress: No Stress Concern Present (04/13/2024)   [facility] from [facility]  Tobacco Use: Low Risk  (05/31/2024)  Health Literacy: Adequate Health Literacy (01/03/2024)   SDOH Interventions:     Readmission Risk Interventions     No data to display

## 2024-06-04 NOTE — Plan of Care (Signed)

## 2024-06-04 NOTE — Progress Notes (Signed)
 Advanced Heart Failure Rounding Note   Subjective:    Diuresing well on lasix gtt. Out 5L. Feeling much better. No CP or SOB.   Walking halls.   SCr 1.79 -> 2.39 -> 2.78 -> 2.65 CVP 5  Echo EF 60-65 RV mild HK   Objective:   Weight Range:  Vital Signs:   Temp:  [98.2 F (36.8 C)-100 F (37.8 C)] 98.2 F (36.8 C) (10/27 0823) Pulse Rate:  [98-124] 103 (10/27 0530) Resp:  [14-28] 23 (10/27 0530) BP: (108-153)/(74-91) 134/81 (10/27 0530) SpO2:  [92 %-97 %] 97 % (10/27 0530) Weight:  [77.6 kg] 77.6 kg (10/27 0500) Last BM Date : 06/04/24  Weight change: Filed Weights   06/02/24 0500 06/03/24 0600 06/04/24 0500  Weight: 86.9 kg 82.8 kg 77.6 kg    Intake/Output:   Intake/Output Summary (Last 24 hours) at 06/04/2024 1029 Last data filed at 06/04/2024 0700 Gross per 24 hour  Intake 7.94 ml  Output 3965 ml  Net -3957.06 ml     Physical Exam: General:  Sitting up in chair No resp difficulty HEENT: normal Neck: supple. no JVD.  Cor: Regular rate & rhythm. No rubs, gallops or murmurs. Lungs: clear Abdomen: soft, nontender, nondistended.Good bowel sounds. Extremities: no cyanosis, clubbing, rash, edema Neuro: alert & orientedx3, cranial nerves grossly intact. moves all 4 extremities w/o difficulty. Affect pleasant    Telemetry: sinus 95-105 Personally reviewed   Labs: Basic Metabolic Panel: Recent Labs  Lab 05/29/24 0403 05/29/24 1026 05/30/24 0456 05/31/24 0415 05/31/24 1901 06/01/24 0443 06/01/24 1838 06/02/24 0456 06/02/24 1504 06/03/24 0545 06/03/24 1557  NA 138   < > 138   < > 138 132* 131* 126* 130* 133* 128*  K 4.5   < > 4.7   < > 5.5* 5.5* 4.6 4.7 4.2 3.4* 3.5  CL 105   < > 110   < > 110 105 102 97* 100 100 95*  CO2 24   < > 21*   < > 20* 19* 19* 21* 22 23 24   GLUCOSE 120*   < > 96   < > 148* 118* 115* 114* 136* 119* 238*  BUN 33*   < > 21   < > 19 19 25* 31* 35* 40* 42*  CREATININE 2.31*   < > 1.80*   < > 1.72* 1.79* 2.39* 2.78* 2.69*  2.65* 2.52*  CALCIUM  8.8*   < > 8.4*   < > 7.6* 7.3* 7.5* 7.4* 7.8* 7.8* 7.8*  MG 2.3  --  2.1  --  3.2* 2.8* 2.7*  --   --   --   --    < > = values in this interval not displayed.    Liver Function Tests: No results for input(s): AST, ALT, ALKPHOS, BILITOT, PROT, ALBUMIN  in the last 168 hours. No results for input(s): LIPASE, AMYLASE in the last 168 hours. No results for input(s): AMMONIA in the last 168 hours.  CBC: Recent Labs  Lab 05/31/24 1901 06/01/24 0443 06/01/24 1838 06/02/24 0456 06/03/24 0545  WBC 14.9* 14.9* 14.1* 12.2* 11.3*  HGB 9.6* 8.7* 8.6* 7.9* 8.3*  HCT 29.8* 26.7* 26.4* 24.6* 24.5*  MCV 94.3 93.7 95.3 95.0 90.7  PLT 249 209 189 169 216    Cardiac Enzymes: No results for input(s): CKTOTAL, CKMB, CKMBINDEX, TROPONINI in the last 168 hours.  BNP: BNP (last 3 results) No results for input(s): BNP in the last 8760 hours.  ProBNP (last 3 results) No results for  input(s): PROBNP in the last 8760 hours.    Other results:  Imaging: DG CHEST PORT 1 VIEW Result Date: 06/04/2024 EXAM: 1 VIEW(S) XRAY OF THE CHEST 06/04/2024 05:41:00 AM COMPARISON: Compared to the previous study. CLINICAL HISTORY: Status post cardiac surgery [id number]. Reason for exam: status post cardiac surgery. FINDINGS: LINES, TUBES AND DEVICES: Left chest tube has been removed. LUNGS AND PLEURA: Mild bibasilar atelectasis has improved since the previous study. Tiny left apical pneumothorax is seen following left chest tube removal. No pulmonary edema. No pleural effusion. HEART AND MEDIASTINUM: Status post CABG. No acute abnormality of the cardiac and mediastinal silhouettes. BONES AND SOFT TISSUES: No acute osseous abnormality. IMPRESSION: 1. Tiny left apical pneumothorax following left chest tube removal. 2. Mild bibasilar atelectasis, improved since the previous study. Electronically signed by: Norleen Kil MD 06/04/2024 06:05 AM EDT RP Workstation: [id number]   DG  CHEST PORT 1 VIEW Result Date: 06/03/2024 EXAM: 1 VIEW(S) XRAY OF THE CHEST 06/03/2024 07:21:00 AM COMPARISON: 06/02/2024 CLINICAL HISTORY: Status post cardiac surgery FINDINGS: LINES, TUBES AND DEVICES: Stable right IJ central venous catheter with tip overlying upper portion of superior vena cava. Stable mediastinal drain and left chest tube. LUNGS AND PLEURA: Low lung volume. Mild pulmonary edema. Bibasilar opacities, likely representing combination of atelectasis and/or consolidation. Bilateral small layering pleural effusions. No pneumothorax. HEART AND MEDIASTINUM: No acute abnormality of the cardiac and mediastinal silhouettes. BONES AND SOFT TISSUES: Sternotomy wires noted. IMPRESSION: 1. Mild pulmonary edema with bibasilar opacities that may reflect atelectasis and/or consolidation. 2. Bilateral pleural effusions, right greater than left. Electronically signed by: Waddell Calk MD 06/03/2024 07:46 AM EDT RP Workstation: [id number]   CT HEAD WO CONTRAST ( ) Result Date: 06/02/2024 EXAM: CT HEAD WITHOUT CONTRAST 06/02/2024 11:20:43 AM TECHNIQUE: CT of the head was performed without the administration of intravenous contrast. Automated exposure control, iterative reconstruction, and/or weight based adjustment of the mA/kV was utilized to reduce the radiation dose to as low as reasonably achievable. COMPARISON: Prior head CT 06/26/2023. CLINICAL HISTORY: 73 year old male. Vision changes after cardiac surgery. FINDINGS: BRAIN AND VENTRICLES: No acute hemorrhage. No evidence of acute infarct. No hydrocephalus. No extra-axial collection. No mass effect or midline shift. Brain volume is stable, with minimal limits for age. Gray white differentiation stable and with minimal limits, including in the occipital lobes. No suspicious intracranial vascular hyperdensity. Mild for age calcified atherosclerosis. ORBITS: No acute abnormality. SINUSES: No acute abnormality. SOFT TISSUES AND SKULL: Soft tissues appear  stable and normal. No skull fracture. IMPRESSION: 1. Stable and normal for age non-contrast head CT. Electronically signed by: Helayne Hurst MD 06/02/2024 11:36 AM EDT RP Workstation: [id number]     Medications:     Scheduled Medications:  acetaminophen   1,000 mg Oral Q6H   Or   acetaminophen  (TYLENOL ) oral liquid 160 mg/5 mL  1,000 mg Per Tube Q6H   [START ON 06/05/2024] aspirin  EC  81 mg Oral Daily   Or   [START ON 06/05/2024] aspirin   324 mg Per Tube Daily   bisacodyl  10 mg Oral Daily   Or   bisacodyl  10 mg Rectal Daily   Chlorhexidine  Gluconate Cloth  6 each Topical Daily   clopidogrel  75 mg Oral Daily   colchicine  0.6 mg Oral Daily   [facility], Patient [facility] Family Education   Does not apply Once   docusate sodium   200 mg Oral Daily   enoxaparin (LOVENOX) injection  30 mg Subcutaneous QHS   fentaNYL  (SUBLIMAZE )  injection  25 mcg Intravenous Once   fluticasone   2 spray Each Nare Daily   insulin aspart  0-24 Units Subcutaneous TID AC & HS   methimazole   10 mg Oral Daily   pantoprazole   40 mg Oral Daily   rosuvastatin   40 mg Oral Daily   sodium chloride  flush  3 mL Intravenous Q12H   tamsulosin   0.4 mg Oral BID   testosterone   5 g Transdermal Daily   umeclidinium-vilanterol  1 puff Inhalation Daily    Infusions:  sodium chloride       PRN Medications: sodium chloride , alum & mag hydroxide-simeth, ipratropium, metoprolol  tartrate, ondansetron  (ZOFRAN ) IV, oxyCODONE , sodium chloride  flush, traMADol, traZODone    Assessment/Plan:   Acute HFpEF - Pre-op echo and intra-op echo with normal EF 65-70%, G1DD, and normal RV - Echo 10/24 EF 60-65% RV mild HK no effusion - Volume status looks good. CVP 5. Can stop IV lasix. Give po as needed    NSTEMI  Multivessel CAD - LHC 10/25 with multivessel CAD, culprit lesion 95% distal RCA - s/p CABG x4  (LIMA-LAD, SVG-diag, SVG-OM, SVG-PDA)  - continue ASA  - no s/s angina  - continue b-blocker  Acute on CKD 3b  likely post-op ATN - baseline Cr 1.6-1.7 - SCr 1.79 -> 2.39 -> 2.78 -> 2.65 likely due to post-op ATN - Diuresing well. Renal function improving.  - stop lasix today   Post-op pericarditis  - on colchcine  Hyponatremia - limit FW  Hypokalemia -supp   Length of Stay: 5  Ellanore Vanhook, MD  11:23 AM   Advanced Heart Failure Team Pager [phone] (M-F; 7a - 4p)  Please contact [facility] for night-coverage after hours (4p -7a ) and weekends on [email or web address]

## 2024-06-04 NOTE — Progress Notes (Addendum)
 4 Days Post-Op Procedure(s) (LRB): CORONARY ARTERY BYPASS GRAFTING (CABG) TIMES FOUR , USING LEFT INTERNAL MAMMARY ARTERY AND GREATER SAPHENOUS VEIN HARVESTED ENDOSCOPICALLY (N/A) ECHOCARDIOGRAM, TRANSESOPHAGEAL, INTRAOPERATIVE (N/A) Subjective: No complaints this AM  Objective: Vital signs in last 24 hours: Temp:  [98.4 F (36.9 C)-100 F (37.8 C)] 98.6 F (37 C) (10/27 0400) Pulse Rate:  [98-124] 103 (10/27 0530) Cardiac Rhythm: Sinus tachycardia (10/26 2100) Resp:  [14-28] 23 (10/27 0530) BP: (108-153)/(71-91) 134/81 (10/27 0530) SpO2:  [92 %-97 %] 97 % (10/27 0530) Weight:  [77.6 kg] 77.6 kg (10/27 0500)  Hemodynamic parameters for last 24 hours: CVP:  [5 mmHg-9 mmHg] 5 mmHg  Intake/Output from previous day: 10/26 0701 - 10/27 0700 In: 262.9 [P.O.:240; I.V.:22.9] Out: 4315 [Urine:4315] Intake/Output this shift: No intake/output data recorded.  General appearance: alert, cooperative, and no distress Neurologic: intact Heart: tachy, regular Lungs: clear to auscultation bilaterally Abdomen: normal findings: soft, non-tender Wound: clean and intact  Lab Results: Recent Labs    06/02/24 0456 06/03/24 0545  WBC 12.2* 11.3*  HGB 7.9* 8.3*  HCT 24.6* 24.5*  PLT 169 216   BMET:  Recent Labs    06/03/24 0545 06/03/24 1557  NA 133* 128*  K 3.4* 3.5  CL 100 95*  CO2 23 24  GLUCOSE 119* 238*  BUN 40* 42*  CREATININE 2.65* 2.52*  CALCIUM  7.8* 7.8*    PT/INR: No results for input(s): LABPROT, INR in the last 72 hours. ABG    Component Value Date/Time   PHART 7.268 (L) 05/31/2024 1843   HCO3 20.8 05/31/2024 1843   TCO2 22 05/31/2024 1843   ACIDBASEDEF 6.0 (H) 05/31/2024 1843   O2SAT 70.8 06/02/2024 0636   CBG (last 3)  Recent Labs    06/02/24 1137 06/02/24 1535 06/03/24 0625  GLUCAP 76 97 122*    Assessment/Plan: S/P Procedure(s) (LRB): CORONARY ARTERY BYPASS GRAFTING (CABG) TIMES FOUR , USING LEFT INTERNAL MAMMARY ARTERY AND GREATER SAPHENOUS  VEIN HARVESTED ENDOSCOPICALLY (N/A) ECHOCARDIOGRAM, TRANSESOPHAGEAL, INTRAOPERATIVE (N/A) POD # 4 NEURO- intact CV- remains tachycardic   On metoprolol - Hx dizziness with it, will change to Atenolol   Colchicine for postop pericarditis RESP- continue IS RENAL- diuresed aggressively over the weekend  Creatinine 2.5, slightly above baseline- monitor  Dc Foley ENDO- CBG well controlled GI- tolerating diet, + BM Scd + enoxaparin Cardiac rehab Transfer to 4E tele  LOS: 5 days    Victor Price 06/04/2024

## 2024-06-05 ENCOUNTER — Inpatient Hospital Stay (HOSPITAL_COMMUNITY)
Admit: 2024-06-05 | Discharge: 2024-06-05 | Disposition: A | Attending: Physician Assistant | Admitting: Physician Assistant

## 2024-06-05 ENCOUNTER — Inpatient Hospital Stay (HOSPITAL_COMMUNITY)

## 2024-06-05 ENCOUNTER — Other Ambulatory Visit (HOSPITAL_COMMUNITY): Payer: Self-pay

## 2024-06-05 DIAGNOSIS — I4891 Unspecified atrial fibrillation: Secondary | ICD-10-CM

## 2024-06-05 DIAGNOSIS — I214 Non-ST elevation (NSTEMI) myocardial infarction: Secondary | ICD-10-CM | POA: Diagnosis not present

## 2024-06-05 LAB — BASIC METABOLIC PANEL WITH GFR
Anion gap: 9 (ref 5–15)
BUN: 40 mg/dL — ABNORMAL HIGH (ref 8–23)
CO2: 25 mmol/L (ref 22–32)
Calcium: 8 mg/dL — ABNORMAL LOW (ref 8.9–10.3)
Chloride: 101 mmol/L (ref 98–111)
Creatinine, Ser: 2.2 mg/dL — ABNORMAL HIGH (ref 0.61–1.24)
GFR, Estimated: 31 mL/min — ABNORMAL LOW (ref >60)
Glucose, Bld: 108 mg/dL — ABNORMAL HIGH (ref 70–99)
Potassium: 3.8 mmol/L (ref 3.5–5.1)
Sodium: 135 mmol/L (ref 135–145)

## 2024-06-05 LAB — CBC
HCT: 26 % — ABNORMAL LOW (ref 39.0–52.0)
Hemoglobin: 8.7 g/dL — ABNORMAL LOW (ref 13.0–17.0)
MCH: 30 pg (ref 26.0–34.0)
MCHC: 33.5 g/dL (ref 30.0–36.0)
MCV: 89.7 fL (ref 80.0–100.0)
Platelets: 316 K/uL (ref 150–400)
RBC: 2.9 MIL/uL — ABNORMAL LOW (ref 4.22–5.81)
RDW: 14 % (ref 11.5–15.5)
WBC: 8.8 K/uL (ref 4.0–10.5)
nRBC: 0 % (ref 0.0–0.2)

## 2024-06-05 LAB — GLUCOSE, CAPILLARY
Glucose-Capillary: 91 mg/dL (ref 70–99)
Glucose-Capillary: 101 mg/dL — ABNORMAL HIGH (ref 70–99)

## 2024-06-05 MED ORDER — BISOPROLOL FUMARATE 10 MG PO TABS
10.0000 mg | ORAL_TABLET | Freq: Every day | ORAL | Status: DC
  Administered 2024-06-05: 10 mg via ORAL
  Filled 2024-06-05: qty 1

## 2024-06-05 MED ORDER — FLUTICASONE PROPIONATE 50 MCG/ACT NA SUSP: 2.0000 | Freq: Every day | NASAL | Status: AC | PRN

## 2024-06-05 MED ORDER — ROSUVASTATIN CALCIUM 40 MG PO TABS
40.0000 mg | ORAL_TABLET | Freq: Every day | ORAL | 1 refills | Status: DC
  Filled 2024-06-05: qty 30, 30d supply, fill #0

## 2024-06-05 MED ORDER — METHIMAZOLE 10 MG PO TABS: 10.0000 mg | ORAL_TABLET | Freq: Every day | ORAL | Status: DC

## 2024-06-05 MED ORDER — AMIODARONE HCL 200 MG PO TABS
400.0000 mg | ORAL_TABLET | Freq: Two times a day (BID) | ORAL | Status: DC
  Filled 2024-06-05: qty 2

## 2024-06-05 MED ORDER — COLCHICINE 0.6 MG PO TABS
0.6000 mg | ORAL_TABLET | Freq: Every day | ORAL | 0 refills | Status: DC
  Filled 2024-06-05: qty 30, 30d supply, fill #0

## 2024-06-05 MED ORDER — TRAZODONE HCL 50 MG PO TABS: 100.0000 mg | ORAL_TABLET | Freq: Every day | ORAL | Status: AC

## 2024-06-05 MED ORDER — POTASSIUM CHLORIDE CRYS ER 10 MEQ PO TBCR
10.0000 meq | EXTENDED_RELEASE_TABLET | Freq: Every day | ORAL | Status: DC
  Administered 2024-06-05: 10 meq via ORAL
  Filled 2024-06-05: qty 1

## 2024-06-05 MED ORDER — GABAPENTIN 600 MG PO TABS: 300.0000 mg | ORAL_TABLET | Freq: Two times a day (BID) | ORAL | Status: AC

## 2024-06-05 MED ORDER — ASPIRIN 81 MG PO TBEC: 81.0000 mg | DELAYED_RELEASE_TABLET | Freq: Every day | ORAL | Status: AC

## 2024-06-05 MED ORDER — CLOPIDOGREL BISULFATE 75 MG PO TABS
75.0000 mg | ORAL_TABLET | Freq: Every day | ORAL | 1 refills | Status: DC
  Filled 2024-06-05: qty 30, 30d supply, fill #0

## 2024-06-05 MED ORDER — IPRATROPIUM BROMIDE 0.03 % NA SOLN: 2.0000 | Freq: Every day | NASAL | Status: AC | PRN

## 2024-06-05 MED ORDER — BISOPROLOL FUMARATE 10 MG PO TABS
10.0000 mg | ORAL_TABLET | Freq: Every day | ORAL | 1 refills | Status: DC
  Filled 2024-06-05: qty 30, 30d supply, fill #0

## 2024-06-05 MED ORDER — AMIODARONE HCL 200 MG PO TABS
400.0000 mg | ORAL_TABLET | Freq: Two times a day (BID) | ORAL | Status: DC
  Administered 2024-06-05: 400 mg via ORAL

## 2024-06-05 MED ORDER — AMIODARONE HCL 200 MG PO TABS
ORAL_TABLET | ORAL | 1 refills | Status: DC
Start: 2024-06-05 — End: 2024-06-21
  Filled 2024-06-05: qty 60, 18d supply, fill #0

## 2024-06-05 MED ORDER — SILDENAFIL CITRATE 20 MG PO TABS: 20.0000 mg | ORAL_TABLET | Freq: Every day | ORAL | Status: AC

## 2024-06-05 NOTE — Plan of Care (Signed)

## 2024-06-05 NOTE — Progress Notes (Addendum)
                  [address]           Th[place]OCKS [place], [place] [address]                     [phone]        5 Days Post-Op Procedure(s) (LRB): CORONARY ARTERY BYPASS GRAFTING (CABG) TIMES FOUR , USING LEFT INTERNAL MAMMARY ARTERY AND GREATER SAPHENOUS VEIN HARVESTED ENDOSCOPICALLY (N/A) ECHOCARDIOGRAM, TRANSESOPHAGEAL, INTRAOPERATIVE (N/A)  Subjective: Patient did not sleep much. He has no other specific complaint this am.  Objective: Vital signs in last 24 hours: Temp:  [97.5 F (36.4 C)-98.2 F (36.8 C)] 98 F (36.7 C) (10/28 0336) Pulse Rate:  [57-164] 87 (10/28 0518) Cardiac Rhythm: Normal sinus rhythm (10/27 1900) Resp:  [14-42] 25 (10/28 0518) BP: (92-135)/(53-85) 105/63 (10/28 0336) SpO2:  [73 %-100 %] 97 % (10/28 0518) Weight:  [76.9 kg] 76.9 kg (10/28 0518)  Pre op weight 73.6 kg Current Weight  06/05/24 76.9 kg      Intake/Output from previous day: 10/27 0701 - 10/28 0700 In: 200 [I.V.:200] Out: 500 [Urine:500]   Physical Exam:  Cardiovascular: RRR Pulmonary: Clear to auscultation bilaterally Abdomen: Soft, non tender, bowel sounds present. Extremities: Trace bilateral lower extremity edema. Wounds: Clean and dry.  No erythema or signs of infection.  Lab Results: CBC: Recent Labs    06/04/24 1017 06/05/24 0255  WBC 9.7 8.8  HGB 9.6* 8.7*  HCT 28.5* 26.0*  PLT 327 316   BMET:  Recent Labs    06/04/24 1017 06/05/24 0255  NA 136 135  K 4.2 3.8  CL 99 101  CO2 26 25  GLUCOSE 106* 108*  BUN 45* 40*  CREATININE 2.49* 2.20*  CALCIUM  8.5* 8.0*    PT/INR:  Lab Results  Component Value Date   INR 1.2 05/31/2024   INR 0.9 05/30/2024   ABG:  INR: Will add last result for INR, ABG once components are confirmed Will add last 4 CBG results once components are confirmed  Assessment/Plan:  1. CV - S/p NSTEMI. A fib yesterday. Maintaining SR. On Amiodarone drip, Toprol  XL 25 mg daily and Plavix 75 mg daily. Will transition  to oral Amiodarone. Per note yesterday and discussion this am with patient, Toprol  XL makes him dizzy so will change to Bisoprolol  (taken prior to surgery). He had post op pericarditis and is on Colchicine.  2.  Pulmonary - On room air. Check PA/LAT CXR. Encourage incentive spirometer. 3.  Expected post op acute blood loss anemia - H and H this am decreased to 8.7 and 26. 4. CBGs 102/114/91. Pre op HGA1C 5.5. Will stop accu checks and SS PRN 5. Gently supplement potassium (3.8) 6. Acute on chronic CKD (stage IIIb)-Creatinine this am decreased from 2.49 to 2.2. Appears baseline around 2 7. On Lovenox for DVT prophylaxis 8. History of BPH-on Flomax  0.4 mg bid 9. History of hyperthyroidism-on Methimazole  10 mg daily 10. Disposition-will discuss with surgeon;later today vs am  Donielle M ZimmermanPA-C 7:05 AM Patient seen and examined, agree with above Strongly recommended he stay tonight for 24 hours of monitoring after conversion to PO amiodarone. He is adamant that he is leaving today. Wil see if we can get Zio patch and early f/u in A fib clinic and then possibly dc tonight  Elspeth C. Kerrin, MD [facility] [phone]

## 2024-06-05 NOTE — Progress Notes (Addendum)
 Advanced Heart Failure Rounding Note   Subjective:    Went into Afib yesterday and placed on amio gtt. Back in NSR today.    No longer w/ central access but wt continues to trend down and now back to pre-op wt (CVP 5 yesterday).  SCr continue to improve, 2.5>>2.2   Feels ok. Denies dyspnea. Eager to go home today   Objective:   Weight Range:  Vital Signs:   Temp:  [97.5 F (36.4 C)-98.2 F (36.8 C)] 98 F (36.7 C) (10/28 0336) Pulse Rate:  [57-164] 87 (10/28 0518) Resp:  [14-42] 25 (10/28 0518) BP: (92-135)/(53-85) 105/63 (10/28 0336) SpO2:  [73 %-100 %] 97 % (10/28 0518) Weight:  [76.9 kg] 76.9 kg (10/28 0518) Last BM Date : 06/04/24  Weight change: Filed Weights   06/03/24 0600 06/04/24 0500 06/05/24 0518  Weight: 82.8 kg 77.6 kg 76.9 kg    Intake/Output:   Intake/Output Summary (Last 24 hours) at 06/05/2024 9187 Last data filed at 06/05/2024 0326 Gross per 24 hour  Intake 200 ml  Output 500 ml  Net -300 ml     Physical Exam  GENERAL: NAD Lungs- clear CARDIAC:  JVP not elevated. Normal rate with regular rhythm. No MRG, no LEE   ABDOMEN: Soft, non-tender, non-distended.  EXTREMITIES: Warm and well perfused.  NEUROLOGIC: No obvious FND  Telemetry: sinus NSR 90s Personally reviewed   Labs: Basic Metabolic Panel: Recent Labs  Lab 05/30/24 0456 05/31/24 0415 05/31/24 1901 06/01/24 0443 06/01/24 1838 06/02/24 0456 06/02/24 1504 06/03/24 0545 06/03/24 1557 06/04/24 1017 06/05/24 0255  NA 138   < > 138 132* 131*   < > 130* 133* 128* 136 135  K 4.7   < > 5.5* 5.5* 4.6   < > 4.2 3.4* 3.5 4.2 3.8  CL 110   < > 110 105 102   < > 100 100 95* 99 101  CO2 21*   < > 20* 19* 19*   < > 22 23 24 26 25   GLUCOSE 96   < > 148* 118* 115*   < > 136* 119* 238* 106* 108*  BUN 21   < > 19 19 25*   < > 35* 40* 42* 45* 40*  CREATININE 1.80*   < > 1.72* 1.79* 2.39*   < > 2.69* 2.65* 2.52* 2.49* 2.20*  CALCIUM  8.4*   < > 7.6* 7.3* 7.5*   < > 7.8* 7.8* 7.8* 8.5*  8.0*  MG 2.1  --  3.2* 2.8* 2.7*  --   --   --   --   --   --    < > = values in this interval not displayed.    Liver Function Tests: No results for input(s): AST, ALT, ALKPHOS, BILITOT, PROT, ALBUMIN  in the last 168 hours. No results for input(s): LIPASE, AMYLASE in the last 168 hours. No results for input(s): AMMONIA in the last 168 hours.  CBC: Recent Labs  Lab 06/01/24 1838 06/02/24 0456 06/03/24 0545 06/04/24 1017 06/05/24 0255  WBC 14.1* 12.2* 11.3* 9.7 8.8  HGB 8.6* 7.9* 8.3* 9.6* 8.7*  HCT 26.4* 24.6* 24.5* 28.5* 26.0*  MCV 95.3 95.0 90.7 89.6 89.7  PLT 189 169 216 327 316    Cardiac Enzymes: No results for input(s): CKTOTAL, CKMB, CKMBINDEX, TROPONINI in the last 168 hours.  BNP: BNP (last 3 results) No results for input(s): BNP in the last 8760 hours.  ProBNP (last 3 results) No results for input(s): PROBNP in  the last 8760 hours.    Other results:  Imaging: DG CHEST PORT 1 VIEW Result Date: 06/04/2024 EXAM: 1 VIEW(S) XRAY OF THE CHEST 06/04/2024 05:41:00 AM COMPARISON: Compared to the previous study. CLINICAL HISTORY: Status post cardiac surgery [id number]. Reason for exam: status post cardiac surgery. FINDINGS: LINES, TUBES AND DEVICES: Left chest tube has been removed. LUNGS AND PLEURA: Mild bibasilar atelectasis has improved since the previous study. Tiny left apical pneumothorax is seen following left chest tube removal. No pulmonary edema. No pleural effusion. HEART AND MEDIASTINUM: Status post CABG. No acute abnormality of the cardiac and mediastinal silhouettes. BONES AND SOFT TISSUES: No acute osseous abnormality. IMPRESSION: 1. Tiny left apical pneumothorax following left chest tube removal. 2. Mild bibasilar atelectasis, improved since the previous study. Electronically signed by: Norleen Kil MD 06/04/2024 06:05 AM EDT RP Workstation: [id number]     Medications:     Scheduled Medications:  acetaminophen   1,000 mg Oral  Q6H   amiodarone  400 mg Oral BID   aspirin  EC  81 mg Oral Daily   bisacodyl  10 mg Oral Daily   Or   bisacodyl  10 mg Rectal Daily   bisoprolol   10 mg Oral Daily   Chlorhexidine  Gluconate Cloth  6 each Topical Daily   clopidogrel  75 mg Oral Daily   colchicine  0.6 mg Oral Daily   docusate sodium   200 mg Oral Daily   enoxaparin (LOVENOX) injection  30 mg Subcutaneous QHS   fentaNYL  (SUBLIMAZE ) injection  25 mcg Intravenous Once   fluticasone   2 spray Each Nare Daily   methimazole   10 mg Oral Daily   pantoprazole   40 mg Oral Daily   potassium chloride  10 mEq Oral Daily   rosuvastatin   40 mg Oral Daily   sodium chloride  flush  3 mL Intravenous Q12H   tamsulosin   0.4 mg Oral BID   testosterone   5 g Transdermal Daily   umeclidinium-vilanterol  1 puff Inhalation Daily    Infusions:  sodium chloride      amiodarone 30 mg/hr (06/05/24 0307)    PRN Medications: sodium chloride , alum & mag hydroxide-simeth, ipratropium, ondansetron  (ZOFRAN ) IV, oxyCODONE , sodium chloride  flush, traMADol, traZODone    Assessment/Plan:   Acute HFpEF - Pre-op echo and intra-op echo with normal EF 65-70%, G1DD, and normal RV - Echo 10/24 EF 60-65% RV mild HK no effusion - Euvolemic on exam, back to pre-op wt. Can use Lasix PRN     NSTEMI  Multivessel CAD - LHC 10/25 with multivessel CAD, culprit lesion 95% distal RCA - s/p CABG x4  (LIMA-LAD, SVG-diag, SVG-OM, SVG-PDA)  - No s/s of angina  - continue ASA 81 mg daily and Plavix 75 daily - continue bisoprolol  10 mg daily  - continue Crestor  40 mg daily   Acute on CKD 3b likely post-op ATN - baseline Cr 1.6-1.7 - SCr 1.79 -> 2.39 -> 2.78 -> 2.65 likely due to post-op ATN - Diuresing well. Renal function improving, SCr 2.2 today.   Post-op pericarditis  - on colchcine 0.6 mg daily   Hyponatremia - resolved, Na 135   Hypokalemia - resolved, K 3.8 today   Pt wants to go home today. CT surgery to determine timing of d/c. He can f/u w/  general cardiology (Dr. Lavona)   Length of Stay: 6  Victor Price, [place]  8:12 AM   Advanced Heart Failure Team Pager [phone] (M-F; 7a - 4p)  Please contact [facility] for night-coverage after hours (4p -7a )  and weekends on [email or web address]  Patient seen with PA, I formulated the plan and agree with the above note.   Creatinine down to 2.2 today.  Weight down to pre-op range.  He had a run of AF yesterday, was transient.  In NSR today.   He wants to go home.   General: NAD Neck: JVP 7 cm, no thyromegaly or thyroid  nodule.  Lungs: Clear to auscultation bilaterally with normal respiratory effort. CV: Nondisplaced PMI.  Heart regular S1/S2, no S3/S4, no murmur.  No peripheral edema.   Abdomen: Soft, nontender, no hepatosplenomegaly, no distention.  Skin: Intact without lesions or rashes.  Neurologic: Alert and oriented x 3.  Psych: Normal affect. Extremities: No clubbing or cyanosis.  HEENT: Normal.   Volume status looks ok, agree with prn Lasix.   Patient is on ASA/Plavix + Crestor  with CAD, NSTEMI.  He had transient AF yesterday.   - Continue amiodarone for at least 1 month post-op.  - If he has recurrent AF, will need to stop Plavix and start apixaban.  2 week Zio monitor being placed.   We will arrange followup in AF clinic and with Dr. Lavona.   Victor Price 06/05/2024 10:58 AM

## 2024-06-05 NOTE — TOC Transition Note (Signed)
 Transition of Care [facility]) - Discharge Note   Patient Details  Name: Victor Price MRN: [id number] Date of Birth: 1951-04-29  Transition of Care [facility]) CM/SW Contact:  Justina Delcia Czar, RN Phone Number: [phone] 06/05/2024, 9:53 AM   Clinical Narrative:    Spoke to pt and declined HH at this time. Pt wife will provide transportation home. Scheduled hospital follow up appt with PCP on 06/12/2024 at 4 pm.    Final next level of care: Home/Self Care Barriers to Discharge: No Barriers Identified   Patient Goals and CMS Choice Patient states their goals for this hospitalization and ongoing recovery are:: wants to remain independent CMS Medicare.gov Compare Post Acute Care list provided to:: Patient Choice offered to / list presented to : Patient      Discharge Placement    Discharge Plan and Services Additional resources added to the After Visit Summary for     Discharge Planning Services: CM Consult Post Acute Care Choice: Home Health                    HH Arranged: Refused [facility]        Social Drivers of Health (SDOH) Interventions SDOH Screenings   Food Insecurity: No Food Insecurity (05/27/2024)  Housing: Low Risk  (05/27/2024)  Transportation Needs: No Transportation Needs (05/27/2024)  Utilities: Not At Risk (05/27/2024)  Alcohol Screen: Low Risk  (01/03/2024)  Depression (PHQ2-9): Low Risk  (05/08/2024)  Financial Resource Strain: Low Risk  (04/13/2024)   Received from [facility]  Physical Activity: Sufficiently Active (04/13/2024)   Received from [facility]  Social Connections: Moderately Integrated (05/27/2024)  Stress: No Stress Concern Present (04/13/2024)   Received from [facility]  Tobacco Use: Low Risk  (05/31/2024)  Health Literacy: Adequate Health Literacy (01/03/2024)     Readmission Risk Interventions     No data to display

## 2024-06-05 NOTE — Progress Notes (Addendum)
 CARDIAC REHAB PHASE I   Post OHS education including site care, restrictions, heart healthy diabetic diet, sternal precautions, home needs at discharge, exercise guidelines, CRP2 reviewed. All questions and concerns addressed. Will refer to [facility] for CRP2.    Fairy JONETTA Music, RN BSN 06/05/2024 10:47 AM [phone]

## 2024-06-05 NOTE — Care Management Important Message (Signed)
 Important Message  Patient Details  Name: Victor Price MRN: [id number] Date of Birth: August 01, 1951   Important Message Given:  Yes - Medicare IM     [name] 06/05/2024, 11:12 AM

## 2024-06-05 NOTE — Plan of Care (Signed)
   Problem: Health Behavior/Discharge Planning: Goal: Ability to manage health-related needs will improve Outcome: Progressing   Problem: Clinical Measurements: Goal: Cardiovascular complication will be avoided Outcome: Progressing

## 2024-06-06 ENCOUNTER — Telehealth: Payer: Self-pay | Admitting: *Deleted

## 2024-06-06 DIAGNOSIS — I4891 Unspecified atrial fibrillation: Secondary | ICD-10-CM | POA: Diagnosis not present

## 2024-06-06 NOTE — Transitions of Care (Post Inpatient/ED Visit) (Signed)
 06/06/2024  Name: Victor Price MRN: [id number] DOB: 08-Feb-1951  Today's TOC FU Call Status: Today's TOC FU Call Status:: Successful TOC FU Call Completed TOC FU Call Complete Date: 06/06/24 Patient's Name and Date of Birth confirmed.  Transition Care Management Follow-up Telephone Call Date of Discharge: 06/05/24 Discharge Facility: [facility]) Type of Discharge: Inpatient Admission Primary Inpatient Discharge Diagnosis:: NSTEMI (non-ST elevated myocardial infarction)/CABG x 4 How have you been since you were released from the hospital?: Better  Items Reviewed: Did you receive and understand the discharge instructions provided?: Yes Medications obtained,verified, and reconciled?: Yes (Medications Reviewed) Any new allergies since your discharge?: No Dietary orders reviewed?: No Do you have support at home?: Yes People in Home [RPT]: spouse Name of Support/Comfort Primary Source: Spouse and daughter  Medications Reviewed Today: Medications Reviewed Today     Reviewed by Kennieth Cathlean DEL, RN (Case Manager) on 06/06/24 at 1054  Med List Status: <None>   Medication Order Taking? Sig Documenting Provider Last Dose Status Informant  amiodarone (PACERONE) 200 MG tablet [id number] Yes Take 2 tablets (400 mg total) by mouth 2 (two) times daily for 4 days, THEN 1 tablet (200 mg total) 2 (two) times daily for 7 days, THEN 1 tablet (200 mg total) daily thereafter. Dwan Aldo M, PA-C  Active   aspirin  EC 81 MG tablet [id number] Yes Take 1 tablet (81 mg total) by mouth daily. Swallow whole. Zimmerman, Donielle M, PA-C  Active   bisoprolol  (ZEBETA ) 10 MG tablet [id number] Yes Take 1 tablet (10 mg total) by mouth daily. Dwan Aldo M, PA-C  Active   clopidogrel (PLAVIX) 75 MG tablet [id number] Yes Take 1 tablet (75 mg total) by mouth daily. Zimmerman, Donielle M, PA-C  Active   colchicine 0.6 MG tablet [id number] Yes Take 1 tablet (0.6 mg total) by mouth daily. Dwan Aldo HERO, PA-C  Active   [facility] ALLERGY  RELIEF-D 10-240 MG 24 hr tablet [id number] Yes TAKE 1 TABLET BY MOUTH EVERY DAY [name], MD  Active Self, Pharmacy Records           Med Note (CRUTHIS, CHLOE C   Mon May 28, 2024  8:37 AM) Pt stated he is taking this medication daily. Claritin -D  fluticasone  (FLONASE ) 50 MCG/ACT nasal spray [id number] Yes Place 2 sprays into both nostrils daily as needed for allergies. Zimmerman, Donielle M, PA-C  Active   gabapentin  (NEURONTIN ) 600 MG tablet [id number] Yes Take 0.5 tablets (300 mg total) by mouth 2 (two) times daily. Zimmerman, Donielle M, PA-C  Active   ipratropium (ATROVENT ) 0.03 % nasal spray [id number] Yes Place 2 sprays into both nostrils daily as needed for rhinitis (runny nose). Zimmerman, Donielle M, PA-C  Active   methimazole  (TAPAZOLE ) 10 MG tablet [id number] Yes Take 1 tablet (10 mg total) by mouth daily. Zimmerman, Donielle M, PA-C  Active   rosuvastatin  (CRESTOR ) 40 MG tablet [id number] Yes Take 1 tablet (40 mg total) by mouth daily. Zimmerman, Donielle M, PA-C  Active   sildenafil  (REVATIO ) 20 MG tablet [id number] Yes Take 1 tablet (20 mg total) by mouth daily. Zimmerman, Donielle M, PA-C  Active   tamsulosin  (FLOMAX ) 0.4 MG CAPS capsule [id number] Yes TAKE 1 CAPSULE (0.4 MG TOTAL) BY MOUTH IN THE MORNING AND AT BEDTIME [name], MD  Active Self, Pharmacy Records  Testosterone  12.5 MG/ACT (1%) GEL [id number] Yes APPLY 1 PUMP TO AN INNER THIGH AND 1 PUMPS TO OPPOSITE INNER THIGH IN AM [name], MD  Active   traZODone  (DESYREL ) 50 MG tablet [id number] Yes Take 2 tablets (100 mg total) by mouth at bedtime. Dwan Aldo M, PA-C  Active   triamcinolone  cream (KENALOG ) 0.1 % [id number] Yes Apply 1 Application topically 2 (two) times daily as needed (Rash). [provider]  Active Self, Pharmacy Records  umeclidinium-vilanterol (ANORO ELLIPTA ) 62.5-25 MCG/ACT AEPB [id number] Yes Inhale 1 puff into the lungs  daily. [name], MD  Active Self, Pharmacy Records           Med Note (CRUTHIS, CHLOE C   Mon May 28, 2024  8:39 AM) Pt is adamant he is still using this medication daily. Dispense report does not support this claim.             Home Care and Equipment/Supplies: Were Home Health Services Ordered?: Yes Name of Home Health Agency:: [facility] Has Agency set up a time to come to your home?: No EMR reviewed for Home Health Orders:  (Patient declined) Any new equipment or medical supplies ordered?: NA  Functional Questionnaire: Do you need assistance with bathing/showering or dressing?: No Do you need assistance with meal preparation?: Yes Do you need assistance with eating?: No Do you have difficulty maintaining continence: No Do you need assistance with getting out of bed/getting out of a chair/moving?: No Do you have difficulty managing or taking your medications?: No  Follow up appointments reviewed: PCP Follow-up appointment confirmed?: Yes Date of PCP follow-up appointment?: 06/12/24 Follow-up Provider: Dorothyann Alvan Specialist [facility] Follow-up appointment confirmed?: Yes Date of Specialist follow-up appointment?: 06/11/24 Follow-Up Specialty Provider:: [id number] Tasia Fenyak/ [id number] Katlyn West Do you need transportation to your follow-up appointment?: No Do you understand care options if your condition(s) worsen?: Yes-patient verbalized understanding  SDOH Interventions Today    Flowsheet Row Most Recent Value  SDOH Interventions   Food Insecurity Interventions Intervention Not Indicated  Housing Interventions Intervention Not Indicated  Transportation Interventions Intervention Not Indicated, Patient Resources (Friends/Family)  Utilities Interventions Intervention Not Indicated   Discussed and offered 30 day TOC program.  Patient declined.  The patient has been provided with contact information for the care management team and has been advised to  call with any health -related questions or concerns.  The patient verbalized understanding with current plan of care.  The patient is directed to their insurance card regarding availability of benefits coverage  Cathlean Headland BSN RN [facility] Health [facility] Health Care Management Coordinator [email or web address] Direct Dial: [phone]  Fax: [phone] Website: [email or web address]

## 2024-06-07 ENCOUNTER — Telehealth (HOSPITAL_COMMUNITY): Payer: Self-pay

## 2024-06-07 NOTE — Telephone Encounter (Signed)
 Faxed outside referral for Phase II Cardiac Rehab to [facility].

## 2024-06-11 ENCOUNTER — Encounter: Payer: Self-pay | Admitting: Radiology

## 2024-06-11 ENCOUNTER — Ambulatory Visit: Payer: Self-pay

## 2024-06-11 ENCOUNTER — Inpatient Hospital Stay (HOSPITAL_COMMUNITY)
Admit: 2024-06-11 | Discharge: 2024-06-11 | Disposition: A | Discharge status: Home / Self Care | Attending: Physician Assistant | Admitting: Physician Assistant

## 2024-06-11 ENCOUNTER — Inpatient Hospital Stay (HOSPITAL_COMMUNITY)

## 2024-06-11 VITALS — BP 114/74 | HR 60 | Resp 18 | Ht 72.0 in | Wt 169.0 lb

## 2024-06-11 DIAGNOSIS — Z951 Presence of aortocoronary bypass graft: Secondary | ICD-10-CM

## 2024-06-11 DIAGNOSIS — J90 Pleural effusion, not elsewhere classified: Secondary | ICD-10-CM | POA: Diagnosis not present

## 2024-06-11 DIAGNOSIS — R918 Other nonspecific abnormal finding of lung field: Secondary | ICD-10-CM | POA: Diagnosis not present

## 2024-06-11 NOTE — Patient Instructions (Signed)
Follow up in 3 weeks with chest xray

## 2024-06-11 NOTE — Progress Notes (Signed)
 [address] [place] [address]             [id number]       HPI:  Patient returns for routine postoperative follow-up having undergone coronary artery bypass grafting x4 (left internal mammary artery to LAD, saphenous vein graft to first diagonal, saphenous vein graft to obtuse marginal 1, saphenous vein graft to posterior  descending) and Endoscopic vein harvest right leg on 05/31/2024 with Dr. Kerrin.   The patient's early postoperative recovery while in the hospital was notable for being hyperkalemic and started on diuretics. His creatinine increased to 2.8.  He was started on lasix drip by advanced heart failure team. His creatinine decreased almost back to baseline at 2.2. He developed atrial fibrillation and was started on IV amiodarone.  He converted to normal sinus rhythm and was transitioned to oral amiodarone prior to discharge.  He was sent home with zio patch.  He was started on colchicine for post operative pericarditis. He was stable for discharge home on 06/05/2024.  Since hospital discharge the patient reports that he has been doing well.  His pain is well controled without the use of any medications.  His incision site have been healing appropriately.  He has been walking at intervals for activity.  He is wearing the zio patch and it will be returned tomorrow.  He denies chest pain, shortness of breath, palpitations and lower leg swelling.     Allergies as of 06/11/2024       Reactions   Losartan  Other (See Comments)   Lightheadedness, dizziness, syncope   Metoprolol  Other (See Comments)   dizziness   Nsaids Other (See Comments)   CKD 3         Medication List        Accurate as of June 11, 2024  2:15 PM. If you have any questions, ask your nurse or doctor.          amiodarone 200 MG tablet Commonly known as: PACERONE Take 2 tablets (400 mg total) by mouth 2 (two) times daily for 4 days, THEN 1 tablet (200 mg total) 2  (two) times daily for 7 days, THEN 1 tablet (200 mg total) daily thereafter. Start taking on: June 05, 2024   Anoro Ellipta  62.5-25 MCG/ACT Aepb Generic drug: umeclidinium-vilanterol Inhale 1 puff into the lungs daily.   aspirin  EC 81 MG tablet Take 1 tablet (81 mg total) by mouth daily. Swallow whole.   bisoprolol  10 MG tablet Commonly known as: ZEBETA  Take 1 tablet (10 mg total) by mouth daily.   clopidogrel 75 MG tablet Commonly known as: PLAVIX Take 1 tablet (75 mg total) by mouth daily.   colchicine 0.6 MG tablet Take 1 tablet (0.6 mg total) by mouth daily.   [facility] Allergy  Relief-D 10-240 MG 24 hr tablet Generic drug: loratadine -pseudoephedrine  TAKE 1 TABLET BY MOUTH EVERY DAY   fluticasone  50 MCG/ACT nasal spray Commonly known as: FLONASE  Place 2 sprays into both nostrils daily as needed for allergies.   gabapentin  600 MG tablet Commonly known as: Neurontin  Take 0.5 tablets (300 mg total) by mouth 2 (two) times daily.   ipratropium 0.03 % nasal spray Commonly known as: ATROVENT  Place 2 sprays into both nostrils daily as needed for rhinitis (runny nose).   methimazole  10 MG tablet Commonly known as: TAPAZOLE  Take 1 tablet (10 mg total) by mouth daily.   rosuvastatin  40 MG tablet Commonly known as: CRESTOR   Take 1 tablet (40 mg total) by mouth daily.   sildenafil  20 MG tablet Commonly known as: REVATIO  Take 1 tablet (20 mg total) by mouth daily.   tamsulosin  0.4 MG Caps capsule Commonly known as: FLOMAX  TAKE 1 CAPSULE (0.4 MG TOTAL) BY MOUTH IN THE MORNING AND AT BEDTIME   Testosterone  12.5 MG/ACT (1%) Gel APPLY 1 PUMP TO AN INNER THIGH AND 1 PUMPS TO OPPOSITE INNER THIGH IN AM   traZODone  50 MG tablet Commonly known as: DESYREL  Take 2 tablets (100 mg total) by mouth at bedtime.   triamcinolone  cream 0.1 % Commonly known as: KENALOG  Apply 1 Application topically 2 (two) times daily as needed (Rash).         ROS  Review of Systems   Constitutional:  Negative for malaise/fatigue.  Respiratory: Negative.  Negative for cough and shortness of breath.   Cardiovascular: Negative.  Negative for chest pain and leg swelling.  Neurological: Negative.  Negative for dizziness and headaches.  All other systems reviewed and are negative.    BP 114/74 (BP Location: Left Arm)   Pulse 60   Resp 18   Ht 6' (1.829 m)   Wt 169 lb (76.7 kg)   SpO2 100%   BMI 22.92 kg/m   Physical Exam Constitutional:      Appearance: Normal appearance.  HENT:     Head: Normocephalic and atraumatic.  Cardiovascular:     Rate and Rhythm: Normal rate and regular rhythm.     Heart sounds: Normal heart sounds, S1 normal and S2 normal.  Pulmonary:     Effort: Pulmonary effort is normal.     Breath sounds: Normal breath sounds.  Skin:    General: Skin is warm and dry.      Neurological:     General: No focal deficit present.     Mental Status: He is alert and oriented to person, place, and time.       Imaging: EXAM: 2 VIEW(S) XRAY OF THE CHEST 06/11/2024 01:46:00 PM   COMPARISON: 06/05/2024   CLINICAL HISTORY: cabg   FINDINGS:   LINES, TUBES AND DEVICES: Cardiac event recorder overlies left chest. CABG markers noted.   LUNGS AND PLEURA: Bibasilar atelectasis or opacity. Small bilateral pleural effusions, increased on the left. No pulmonary edema. No pneumothorax.   HEART AND MEDIASTINUM: No acute abnormality of the cardiac and mediastinal silhouettes.   BONES AND SOFT TISSUES: Sternotomy wires noted.   IMPRESSION: 1. Small bilateral pleural effusions, increased on the left. 2. Bibasilar atelectasis or opacity.   Electronically signed by: Norleen Boxer MD 06/11/2024 02:53 PM EST RP Workstation: [id number]     Assessment/Plan:  S/P CABG x 4 -We reviewed today's chest x ray. Small bilateral pleural effusion. He is asymptomatic at this time. Continue to use incentive spirometer -Discussed continuance of sternal  precautions until a full 6 weeks from surgery -He can increase his exercise/activity as tolerated -Keep incisions clean and dry -Zio patch to be returned tomorrow. He has follow up with cardiology on 06/21/2024 and follow up with PCP on 06/12/2024 -Follow up with [facility] in 3 weeks with chest xray    Manuelita CHRISTELLA Rough, PA-C 2:15 PM 06/11/24

## 2024-06-12 ENCOUNTER — Encounter: Payer: Self-pay | Admitting: Family Medicine

## 2024-06-12 ENCOUNTER — Ambulatory Visit (INDEPENDENT_AMBULATORY_CARE_PROVIDER_SITE_OTHER): Admitting: Family Medicine

## 2024-06-12 VITALS — BP 126/67 | HR 59 | Ht 72.0 in | Wt 170.0 lb

## 2024-06-12 DIAGNOSIS — R600 Localized edema: Secondary | ICD-10-CM

## 2024-06-12 DIAGNOSIS — I10 Essential (primary) hypertension: Secondary | ICD-10-CM

## 2024-06-12 DIAGNOSIS — Z951 Presence of aortocoronary bypass graft: Secondary | ICD-10-CM | POA: Diagnosis not present

## 2024-06-12 DIAGNOSIS — R7301 Impaired fasting glucose: Secondary | ICD-10-CM | POA: Diagnosis not present

## 2024-06-12 DIAGNOSIS — N1832 Chronic kidney disease, stage 3b: Secondary | ICD-10-CM | POA: Diagnosis not present

## 2024-06-12 DIAGNOSIS — E162 Hypoglycemia, unspecified: Secondary | ICD-10-CM

## 2024-06-12 MED ORDER — BLOOD GLUCOSE TEST VI STRP: 1.0000 | ORAL_STRIP | Freq: Every day | 1 refills | Status: DC | PRN

## 2024-06-12 MED ORDER — LANCETS MISC: 1.0000 | Freq: Every day | 1 refills | Status: AC | PRN

## 2024-06-12 MED ORDER — BLOOD GLUCOSE MONITORING SUPPL DEVI: 1.0000 | 0 refills | Status: AC

## 2024-06-12 MED ORDER — LANCET DEVICE MISC: 1.0000 | Freq: Every day | 0 refills | Status: AC | PRN

## 2024-06-12 NOTE — Assessment & Plan Note (Signed)
  Essential hypertension Hypertension managed with medication. Recent medication change noted. Blood pressure monitoring ongoing post-surgery. - Monitor blood pressure regularly.

## 2024-06-12 NOTE — Progress Notes (Signed)
 Established Patient Office Visit  Patient ID: Victor Price, male    DOB: [birth year]  Age: 73 y.o. MRN: [id number] PCP: [name], MD  Chief Complaint  Patient presents with   Hospitalization Follow-up    Subjective:     HPI  Discussed the use of AI scribe software for clinical note transcription with the patient, who gave verbal consent to proceed.  Admit date: 05/26/2024 Discharge date: 06/05/2024 [facility]   Summary of hospitalization: He initially presented to urgent care with complaints of chest pain that have been going on for several days.  EKG was obtained and showed normal sinus rhythm with no changes to prior EKG but was instructed to go to the ED should his pain worsen.  He went to the [facility] emergency department on 1018 after having episodic 20-minute chest pains.  Initially they did not perform CTA because of baseline creatinine of 1.95.  But troponins increased over 200 and so they took him for catheterization which was performed on 1021 showing significant CAD.  Recommendation for CABG x 4 which was completed on 1023 Dr. Kerrin.  He did have a transient bump in serum creatinine postop.  Started on colchicine for postoperative pericarditis prevention.  Started on atenolol as metoprolol  in the past did cause dizziness.  They did recommend Zio patch after discharge.  And he actually did take that off today.  Medications: Bisoprolol , aspirin , Plavix, colchicine.  ACE ARB held because of renal function.  Recommendation for virtual based cardiac rehab.  History of Present Illness Victor Price is a 73 year old male with coronary artery disease and recent quadruple bypass surgery who presents with post-operative weakness and concerns about kidney function.  Postoperative weakness and functional status - Quadruple coronary artery bypass surgery performed recently - Significant postoperative weakness, unable to walk short distances without difficulty  ('I can't hardly walk from here to that door') - Discharged from hospital on Tuesday after declining an additional night of stay  Chest discomfort - No significant chest pain since surgery - Describes chest discomfort as 'uncomfortable' but not severe - No pain medication required for chest discomfort  Peripheral edema - Mild swelling present on the right side, described as 'just a little bit' - Weight has remained stable since hospital discharge  Blood pressure monitoring and medication changes - Blood pressure measured at 114/70 mmHg after walking to an appointment - Concern regarding changes in antihypertensive regimen following hospitalization  Renal function concerns - History of kidney concerns noted during previous hospitalization - No recent laboratory assessment of renal function; plans for blood work next week - Previously used a supplement that may have affected renal function, now discontinued - Concern about impact of current medications on renal function -Creatinine was down to 2.2 on 10/28 (baseline appears around 2).   Glycemic control - Monitoring glucose levels with some low readings observed - Attempting to eat regularly and include protein to stabilize glucose levels - No weight loss since hospital stay - Efforts to maintain a regular eating schedule  Medication management post-surgery - Currently taking a blood thinner and other medications following surgery - Uncertainty regarding all medication changes made during hospitalization - Concern about the impact of medications on overall health and kidney function     ROS    Objective:     BP 126/67   Pulse (!) 59   Ht 6' (1.829 m)   Wt 170 lb (77.1 kg)   SpO2 100%  BMI 23.06 kg/m    Physical Exam Vitals and nursing note reviewed.  Constitutional:      Appearance: Normal appearance.  HENT:     Head: Normocephalic and atraumatic.  Eyes:     Conjunctiva/sclera: Conjunctivae normal.   Cardiovascular:     Rate and Rhythm: Normal rate and regular rhythm.  Pulmonary:     Effort: Pulmonary effort is normal.     Breath sounds: Normal breath sounds.  Musculoskeletal:     Comments: Trace edema in the right foot  Skin:    General: Skin is warm and dry.  Neurological:     Mental Status: He is alert.  Psychiatric:        Mood and Affect: Mood normal.      No results found for any visits on 06/12/24.    The ASCVD Risk score (Arnett DK, et al., 2019) failed to calculate for the following reasons:   Risk score cannot be calculated because patient has a medical history suggesting prior/existing ASCVD    Assessment & Plan:   Problem List Items Addressed This Visit       Cardiovascular and Mediastinum   Essential hypertension    Essential hypertension Hypertension managed with medication. Recent medication change noted. Blood pressure monitoring ongoing post-surgery. - Monitor blood pressure regularly.        Endocrine   IFG (impaired fasting glucose)   Reports episodes of hypoglycemia. Advised on dietary management to stabilize glucose levels. - Prescribed glucometer, lancets, and strips for home glucose monitoring. - Advised on regular meals and protein intake to stabilize glucose levels.      Relevant Orders   CMP14+EGFR   CBC with Differential/Platelet     Genitourinary   CKD stage 3b, GFR 30-44 ml/min (HCC)    Chronic kidney disease and history of acute kidney injury Chronic kidney disease with recent acute kidney injury, likely exacerbated by surgery and medication. Creatinine levels elevated. Discussed acute vs chronic kidney disease and importance of monitoring kidney function. Advised to avoid nephrotoxic medications and maintain hydration. Discussed potential impact of black seed oil and importance of avoiding NSAIDs. - Ordered blood work next week to monitor kidney function. - Advised to avoid NSAIDs like Aleve and ibuprofen. - Encouraged  hydration to support kidney function. - Continue follow-up with nephrologist Dr. Niels.      Relevant Orders   CMP14+EGFR   CBC with Differential/Platelet     Other   S/P CABG x 4   Feeling better, pain well controlled.   Status post coronary artery bypass grafting and vein harvesting Post-operative status following coronary artery bypass grafting with vein harvesting. Reports weakness and difficulty walking, expected post-surgery. No significant chest pain. Swelling noted on the right side where veins were harvested. Blood pressure monitoring ongoing. - Monitor swelling on the right side. - Encouraged gradual increase in activity as tolerated. - Ordered blood work next week to monitor renal function and other parameters.  Medications: Bisoprolol , aspirin , Plavix, colchicine.  ACE ARB held because of renal function.  Recommendation for virtual based cardiac rehab.      Relevant Orders   CMP14+EGFR   CBC with Differential/Platelet   Other Visit Diagnoses       Hypoglycemia    -  Primary   Relevant Medications   Blood Glucose Monitoring Suppl DEVI   Glucose Blood (BLOOD GLUCOSE TEST STRIPS) STRP   Lancet Device MISC   Lancets MISC     Edema of right foot  Assessment and Plan Assessment & Plan   General Health Maintenance Discussed importance of maintaining a healthy diet and regular physical activity to support overall health and manage chronic conditions. - Encouraged a balanced diet with regular meals and protein intake. - Advised on regular physical activity as tolerated.    No follow-ups on file.    Dorothyann Byars, MD [facility] Primary [facility] Medicine at [facility]

## 2024-06-12 NOTE — Assessment & Plan Note (Addendum)
 Feeling better, pain well controlled.   Status post coronary artery bypass grafting and vein harvesting Post-operative status following coronary artery bypass grafting with vein harvesting. Reports weakness and difficulty walking, expected post-surgery. No significant chest pain. Swelling noted on the right side where veins were harvested. Blood pressure monitoring ongoing. - Monitor swelling on the right side. - Encouraged gradual increase in activity as tolerated. - Ordered blood work next week to monitor renal function and other parameters.  Medications: Bisoprolol , aspirin , Plavix, colchicine.  ACE ARB held because of renal function.  Recommendation for virtual based cardiac rehab.

## 2024-06-12 NOTE — Assessment & Plan Note (Signed)
  Chronic kidney disease and history of acute kidney injury Chronic kidney disease with recent acute kidney injury, likely exacerbated by surgery and medication. Creatinine levels elevated. Discussed acute vs chronic kidney disease and importance of monitoring kidney function. Advised to avoid nephrotoxic medications and maintain hydration. Discussed potential impact of black seed oil and importance of avoiding NSAIDs. - Ordered blood work next week to monitor kidney function. - Advised to avoid NSAIDs like Aleve and ibuprofen. - Encouraged hydration to support kidney function. - Continue follow-up with nephrologist Dr. Niels.

## 2024-06-12 NOTE — Assessment & Plan Note (Signed)
 Reports episodes of hypoglycemia. Advised on dietary management to stabilize glucose levels. - Prescribed glucometer, lancets, and strips for home glucose monitoring. - Advised on regular meals and protein intake to stabilize glucose levels.

## 2024-06-18 MED FILL — Magnesium Sulfate Inj 50%: INTRAMUSCULAR | Qty: 10 | Status: AC

## 2024-06-18 MED FILL — Potassium Chloride Inj 2 mEq/ML: INTRAVENOUS | Qty: 40 | Status: AC

## 2024-06-18 MED FILL — Heparin Sodium (Porcine) Inj 1000 Unit/ML: Qty: 1000 | Status: AC

## 2024-06-20 NOTE — Progress Notes (Unsigned)
 Cardiology Office Note    Date:  06/22/2024  ID:  Victor Price, DOB [birth year], MRN [id number] PCP:  [name], MD  Cardiologist:  Lynwood Schilling, MD  Electrophysiologist:  None   Chief Complaint: Follow up for CAD   History of Present Illness: Victor Price   Victor Price is a 73 y.o. male with visit-pertinent history of CAD s/p CABG x 4 with LIMA to LAD, SVG to first diagonal, SVG to obtuse marginal 1, SVG to posterior descending on 05/31/2024 with Dr. Kerrin.  Patient with history of hypertension, CKD stage III, hypothyroidism/Graves' disease, hyperlipidemia and BPH.  Patient presented to urgent care on 10/17 with complaints of chest pain that have been occurring for several days.  He denied any shortness of breath, EKG showed normal sinus rhythm.  On 10/18 he presented to [facility] emergency department with complaints of intermittent chest pain with episodes that occurred 6 times over the prior 3 to 4 days and lasted 20 minutes each.  He denied any associated symptoms EKG remained normal with minimal elevation in troponin level to 81.  Echo on 05/28/2024 indicated LVEF 60 to 65%, no RWMA, diastolic parameters consistent with G1 DD, RV systolic function and size was normal, trivial mitral regurgitation with no stenosis, aortic valve sclerosis was present without evidence of stenosis mild dilation of the aortic root measuring 41 mm.  Cardiac catheterization on 05/29/2024 indicated severe multivessel CAD with recommendation for CVTS consultation.  Patient underwent CABG x 4 on 05/31/2024 with Dr. Kerrin. Patient's postoperative period notable for hyperkalemia, started on diuretics, was followed by advanced heart failure team as he required a Lasix drip.  Echo on 05/28/2024 indicated LVEF 60 to 65%, RV systolic function was mildly reduced, RV size was normal, no evidence of mitral valve regurgitation or stenosis, aortic valve sclerosis present without stenosis.  He developed atrial  fibrillation and was loaded on amiodarone, transition to oral amiodarone prior to discharge.  Patient was also started on colchicine for postoperative pericarditis, it was noted that patient had a pericardial friction rub.  Patient was discharged home in stable condition on 06/05/2024.  Patient was discharged with live telemetry monitor in place, results not yet available.  He was seen by TCTS on 06/11/2024 and 100 and stable for cardiac standpoint.  Today he presents for follow-up, reports that he is doing very well overall. He denies chest pain, notes some mild shortness of breath with exertion, he has been regularly walking on his treadmill and overall tolerates well.  He reports he does have some slight shortness of breath with cyst that resolves with rest.  Patient feels that he is progressing well just notes some overall fatigue that has been improving.  He notes that his right ankle and foot is slightly puffy, his grafts were taken from the right leg.  He denies any palpitations or feeling of irregular heartbeats, denies any presyncope or syncope.  He reports that he has been tolerating his medications well. ROS: .   Today he denies chest pain, shortness of breath, lower extremity edema, fatigue, palpitations, melena, hematuria, hemoptysis, diaphoresis, weakness, presyncope, syncope, orthopnea, and PND.  All other systems are reviewed and otherwise negative. Studies Reviewed: Victor Price   EKG:  EKG is ordered today, personally reviewed, demonstrating  EKG Interpretation Date/Time:  Thursday June 21 2024 09:52:08 EST Ventricular Rate:  62 PR Interval:  196 QRS Duration:  104 QT Interval:  490 QTC Calculation: 497 R Axis:   83  Text Interpretation:  Normal sinus rhythm Possible Left atrial enlargement T wave inversion anterolateral leads T wave abnormality inferior leads Prolonged QT Confirmed by Nerida Boivin [phone]) on 06/21/2024 5:38:18 PM   CV Studies: Cardiac studies reviewed are outlined and  summarized above. Otherwise please see EMR for full report. Cardiac Studies & Procedures   ______________________________________________________________________________________________ CARDIAC CATHETERIZATION  CARDIAC CATHETERIZATION 05/29/2024  Conclusion Images from the original result were not included.    Prox RCA-1 lesion is 20% stenosed.  Prox RCA-2 lesion is 70% stenosed.   Dist RCA-1 lesion is 95% stenosed.   Dist RCA-2 lesion is 85% stenosed.  RPDA lesion is 85% stenosed.   Ost LAD to Mid LAD lesion is 20% stenosed with 70% stenosed side branch in 1st Diag.   Mid LAD-1 lesion is 50% stenosed.  Mid LAD-2 lesion is 70% stenosed. Mid LAD-3 lesion is 50% stenosed with 40% stenosed side branch in 2nd Diag. (VFFR for both the sidebranch and LAD was 0.77)   Mid Cx to Dist Cx lesion is 50% stenosed with 60% stenosed side branch in 2nd Mrg.   ------------------------------------   LV end diastolic pressure is normal.  There is no aortic valve stenosis.  Dominance: Right  Severe Multivessel CAD: 20% proximal RCA followed by 70% eccentric lesion in the prox-mid RCA followed by the CULPRIT LESION which is a 95% focal lesion in the distal RCA and then an extensive 85% stenosis from distal RCA into PDA crossing the PAV Heavily calcified Left Main into the LAD and LCx: Minimal proximal disease with 70% ostial to proximal 1st Diag, 50 with focal 70% stenosis mid LAD prior to 2nd Diag then 40% at the bifurcation of D2 with 50% ostial 2nd Diag (the FFR of the 70% LAD lesion was 0.77 for both the LAD and 2nd Diag) 50 to 60% proximal LCx into the AV groove and OM1 branch. Borderline significant. Normal LVEDP   RECOMMENDATIONS   Will place referral for CVTS consultation; continue to titrate GDMT for CAD   Will restart IV heparin 8 hours post TR band Removal   Recommend Aspirin  81mg  daily for moderate CAD.    Alm Clay, MD  Findings Coronary Findings Diagnostic  Dominance:  Right  Left Anterior Descending The vessel is moderately calcified. Proximal third of the vessel Ost LAD to Mid LAD lesion is 20% stenosed with 70% stenosed side branch in 1st Diag. Mid LAD-1 lesion is 50% stenosed. Mid LAD-2 lesion is 70% stenosed. Mid LAD-3 lesion is 50% stenosed with 40% stenosed side branch in 2nd Diag. Pressure gradient was performed on the lesion. Virtual FFR: 77. Cath Works  First Biomedical Scientist is small in size.  Second Diagonal Branch Vessel is moderate in size.  Second Septal Branch Vessel is small in size.  Third Diagonal Branch Vessel is small in size.  Left Circumflex Vessel is large. Mid Cx to Dist Cx lesion is 50% stenosed with 60% stenosed side branch in 2nd Mrg.  First Obtuse Marginal Branch Vessel is small in size.  Second Obtuse Marginal Branch Vessel is moderate in size.  First Left Posterolateral Branch Vessel is moderate in size.  Left Posterior Atrioventricular Artery Vessel is moderate in size.  Right Coronary Artery Vessel was injected. Vessel is large. Prox RCA-1 lesion is 20% stenosed. Prox RCA-2 lesion is 70% stenosed. Dist RCA-1 lesion is 95% stenosed. Vessel is the culprit lesion. Dist RCA-2 lesion is 85% stenosed. The lesion is located at the bifurcation, eccentric and ulcerative.  Acute Marginal Branch Vessel  is small in size.  Right Ventricular Branch Vessel is small in size.  Right Posterior Descending Artery RPDA lesion is 85% stenosed. The lesion is located at the bifurcation, eccentric and ulcerative.  First Right Posterolateral Branch Vessel is small in size.  Second Right Posterolateral Branch Vessel is moderate in size.  Intervention  No interventions have been documented.     ECHOCARDIOGRAM  ECHOCARDIOGRAM LIMITED 06/01/2024  Narrative ECHOCARDIOGRAM LIMITED REPORT    Patient Name:   Victor Price Date of Exam: 06/01/2024 Medical Rec #:  [id number]      Height:       72.0  in Accession #:    [id number]     Weight:       190.3 lb Date of Birth:  Dec 07, 1950       BSA:          2.086 m Patient Age:    73 years       BP:           108/75 mmHg Patient Gender: M              HR:           94 bpm. Exam Location:  Inpatient  Procedure: 2D Echo and Limited Echo (Both Spectral and Color Flow Doppler were utilized during procedure).  Indications:    CHF I50.9  History:        Patient has prior history of Echocardiogram examinations, most recent 05/28/2024. Risk Factors:Hypertension. NSTEMI, CABG, Precordial chest pain, Hyperlipidemia.  Sonographer:    BERNARDA ROCKS Referring Phys: [id number] JORDAN LEE   Sonographer Comments: Technically difficult due to S/P CABG. IMPRESSIONS   1. Left ventricular ejection fraction, by estimation, is 60 to 65%. The left ventricle has normal function. 2. Right ventricular systolic function is mildly reduced. The right ventricular size is normal. 3. The mitral valve is normal in structure. No evidence of mitral valve regurgitation. 4. The aortic valve was not well visualized. There is mild calcification of the aortic valve. Aortic valve sclerosis/calcification is present, without any evidence of aortic stenosis.  FINDINGS Left Ventricle: Left ventricular ejection fraction, by estimation, is 60 to 65%. The left ventricle has normal function.  Right Ventricle: The right ventricular size is normal. No increase in right ventricular wall thickness. Right ventricular systolic function is mildly reduced.  Left Atrium: Left atrial size was normal in size.  Right Atrium: Right atrial size was normal in size.  Pericardium: There is no evidence of pericardial effusion.  Mitral Valve: The mitral valve is normal in structure.  Tricuspid Valve: The tricuspid valve is normal in structure.  Aortic Valve: The aortic valve was not well visualized. There is mild calcification of the aortic valve. Aortic valve sclerosis/calcification is  present, without any evidence of aortic stenosis.  Pulmonic Valve: The pulmonic valve was not assessed.  Aorta: The aortic root is normal in size and structure.  Venous: The inferior vena cava was not well visualized.  LEFT VENTRICLE PLAX 2D LVIDd:         3.20 cm LVIDs:         2.10 cm LV PW:         1.00 cm LV IVS:        1.00 cm LVOT diam:     2.00 cm LVOT Area:     3.14 cm  LV Volumes (MOD) LV vol d, MOD A2C: 90.7 ml LV vol d, MOD A4C: 98.8 ml LV vol s, MOD A2C: 38.3  ml LV vol s, MOD A4C: 34.2 ml LV SV MOD A2C:     52.4 ml LV SV MOD A4C:     98.8 ml LV SV MOD BP:      59.5 ml  RIGHT VENTRICLE RV Basal diam:  3.50 cm TAPSE (M-mode): 1.5 cm  LEFT ATRIUM             Index        RIGHT ATRIUM           Index LA diam:        3.90 cm 1.87 cm/m   RA Area:     14.80 cm LA Vol (A2C):   53.6 ml 25.70 ml/m  RA Volume:   38.70 ml  18.55 ml/m LA Vol (A4C):   37.5 ml 17.98 ml/m LA Biplane Vol: 45.4 ml 21.77 ml/m  AORTA Ao Root diam: 3.70 cm   SHUNTS Systemic Diam: 2.00 cm  Toribio Fuel MD Electronically signed by Toribio Fuel MD Signature Date/Time: 06/01/2024/4:40:38 PM    Final   TEE  ECHO INTRAOPERATIVE TEE 05/31/2024  Narrative *INTRAOPERATIVE TRANSESOPHAGEAL REPORT *    Patient Name:   Victor Price Date of Exam: 05/31/2024 Medical Rec #:  [id number]      Height:       72.0 in Accession #:    [id number]     Weight:       162.0 lb Date of Birth:  1951-06-29       BSA:          1.95 m Patient Age:    73 years       BP:           156/92 mmHg Patient Gender: M              HR:           65 bpm. Exam Location:  Inpatient  Transesophogeal exam was perform intraoperatively during surgical procedure. Patient was closely monitored under general anesthesia during the entirety of examination.  Indications:     CABG Performing Phys: [id number] ERIN R BARRETT Diagnosing Phys: Norleen Pope MD  Complications: No known complications during this  procedure. POST-OP IMPRESSIONS _ Left Ventricle: has normal systolic function, with an ejection fraction of 65-70%. _ Right Ventricle: The right ventricle appears unchanged from pre-bypass. _ Aorta: The aorta appears unchanged from pre-bypass. _ Left Atrial Appendage: The left atrial appendage appears unchanged from pre-bypass. _ Aortic Valve: The aortic valve appears unchanged from pre-bypass. _ Mitral Valve: The mitral valve appears unchanged from pre-bypass. _ Tricuspid Valve: The tricuspid valve appears unchanged from pre-bypass. _ Pulmonic Valve: The pulmonic valve appears unchanged from pre-bypass. _ Interatrial Septum: The interatrial septum appears unchanged from pre-bypass.  PRE-OP FINDINGS Left Ventricle: The left ventricle has normal systolic function, with an ejection fraction of 55-60%. The cavity size was normal. There is no left ventricular hypertrophy.   Right Ventricle: The right ventricle has normal systolic function. The cavity was normal. There is no increase in right ventricular wall thickness.  Left Atrium: Left atrial size was normal in size. No left atrial/left atrial appendage thrombus was detected. The left atrial appendage is well visualized and there is no evidence of thrombus present.  Right Atrium: Right atrial size was normal in size.  Interatrial Septum: No atrial level shunt detected by color flow Doppler.  Pericardium: There is no evidence of pericardial effusion.  Mitral Valve: The mitral valve is normal in structure. Mitral valve regurgitation is  trivial by color flow Doppler. There is no evidence of mitral valve vegetation.  Tricuspid Valve: The tricuspid valve was normal in structure. Tricuspid valve regurgitation is trivial by color flow Doppler. There is no evidence of tricuspid valve vegetation.  Aortic Valve: The aortic valve is tricuspid Aortic valve regurgitation was not visualized by color flow Doppler. There is no stenosis of the aortic  valve.   Pulmonic Valve: The pulmonic valve was normal in structure. Pulmonic valve regurgitation is mild by color flow Doppler.   Aorta: The aortic root, ascending aorta and aortic arch are normal in size and structure. There is evidence of plaque in the descending aorta.  +-------------+-----------++ AORTIC VALVE             +-------------+-----------++ AV Vmax:     101.00 cm/s +-------------+-----------++ AV Vmean:    75.200 cm/s +-------------+-----------++ AV VTI:      0.220 m     +-------------+-----------++ AV Peak Grad:4.1 mmHg    +-------------+-----------++ AV Mean Grad:3.0 mmHg    +-------------+-----------++   Norleen Pope MD Electronically signed by Norleen Pope MD Signature Date/Time: 05/31/2024/4:19:58 PM    Final        ______________________________________________________________________________________________       Current Reported Medications:.    Current Meds  Medication Sig   amiodarone (PACERONE) 200 MG tablet Take 1 tablet (200 mg total) by mouth daily.   aspirin  EC 81 MG tablet Take 1 tablet (81 mg total) by mouth daily. Swallow whole.   bisoprolol  (ZEBETA ) 10 MG tablet Take 1 tablet (10 mg total) by mouth daily.   Blood Glucose Monitoring Suppl DEVI 1 each by Does not apply route as directed. Dispense based on patient and insurance preference. Use up to four times daily as directed. (FOR ICD-10 E10.9, E11.9).   clopidogrel (PLAVIX) 75 MG tablet Take 1 tablet (75 mg total) by mouth daily.   [employer] ALLERGY  RELIEF-D 10-240 MG 24 hr tablet TAKE 1 TABLET BY MOUTH EVERY DAY   fluticasone  (FLONASE ) 50 MCG/ACT nasal spray Place 2 sprays into both nostrils daily as needed for allergies.   gabapentin  (NEURONTIN ) 600 MG tablet Take 0.5 tablets (300 mg total) by mouth 2 (two) times daily.   Glucose Blood (BLOOD GLUCOSE TEST STRIPS) STRP 1 each by Does not apply route daily as needed. Dispense based on patient and insurance  preference. Use up to four times daily as directed. (FOR ICD-10 E10.9, E11.9).   ipratropium (ATROVENT ) 0.03 % nasal spray Place 2 sprays into both nostrils daily as needed for rhinitis (runny nose).   Lancet Device MISC 1 each by Does not apply route daily as needed. Dispense based on patient and insurance preference. Use up to four times daily as directed. (FOR ICD-10 E10.9, E11.9).   Lancets MISC 1 each by Does not apply route daily as needed. Dispense based on patient and insurance preference. Use up to four times daily as directed. (FOR ICD-10 E10.9, E11.9).   methimazole  (TAPAZOLE ) 10 MG tablet Take 1 tablet (10 mg total) by mouth daily.   sildenafil  (REVATIO ) 20 MG tablet Take 1 tablet (20 mg total) by mouth daily.   tamsulosin  (FLOMAX ) 0.4 MG CAPS capsule TAKE 1 CAPSULE (0.4 MG TOTAL) BY MOUTH IN THE MORNING AND AT BEDTIME   Testosterone  12.5 MG/ACT (1%) GEL APPLY 1 PUMP TO AN INNER THIGH AND 1 PUMPS TO OPPOSITE INNER THIGH IN AM   traZODone  (DESYREL ) 50 MG tablet Take 2 tablets (100 mg total) by mouth at bedtime.  triamcinolone  cream (KENALOG ) 0.1 % Apply 1 Application topically 2 (two) times daily as needed (Rash).   umeclidinium-vilanterol (ANORO ELLIPTA ) 62.5-25 MCG/ACT AEPB Inhale 1 puff into the lungs daily.   [DISCONTINUED] amiodarone (PACERONE) 200 MG tablet Take 2 tablets (400 mg total) by mouth 2 (two) times daily for 4 days, THEN 1 tablet (200 mg total) 2 (two) times daily for 7 days, THEN 1 tablet (200 mg total) daily thereafter.   [DISCONTINUED] colchicine 0.6 MG tablet Take 1 tablet (0.6 mg total) by mouth daily.   [DISCONTINUED] rosuvastatin  (CRESTOR ) 40 MG tablet Take 1 tablet (40 mg total) by mouth daily.   Physical Exam:    VS:  BP 115/63 (BP Location: Left Arm, Patient Position: Sitting, Cuff Size: Normal)   Pulse 62   Ht 6' (1.829 m)   Wt 171 lb (77.6 kg)   SpO2 99%   BMI 23.19 kg/m    Wt Readings from Last 3 Encounters:  06/21/24 171 lb (77.6 kg)  06/12/24 170  lb (77.1 kg)  06/11/24 169 lb (76.7 kg)    GEN: Well nourished, well developed in no acute distress NECK: No JVD; No carotid bruits CARDIAC: RRR, no murmurs, rubs, gallops RESPIRATORY:  Clear to auscultation without rales, wheezing or rhonchi  ABDOMEN: Soft, non-tender, non-distended EXTREMITIES:  No edema; No acute deformity     Asessement and Plan:.    CAD: S/p CABG x 4 on 05/31/2024 with Dr. Kerrin.  Today he reports he is doing well, denies any significant chest pain, notes mild shortness of breath when walking on the treadmill, has noted some improvement.  He does have some generalized fatigue that has been slowly improving since surgery.  Surgical sites are clean and intact without evidence of infection.  Patient will have lab work completed at his PCPs office today.  Will defer starting of cardiac rehab to TCTS.  Reviewed ED precautions.  Continue amiodarone 2 mg daily, aspirin  81 mg daily, bisoprolol  10 mg daily, Plavix 75 mg daily, colchicine 0.6 mg daily, Crestor  40 mg daily.  HFpEF: Pre-op echo and intra-op echo with normal EF 65-70%, G1DD, and normal RV. Echo 10/24 EF 60-65% RV mild HK no effusion. Today he appears overall euvolemic and well compensated on exam.  His right ankle is slightly swollen, no evidence of significant edema.  Encouraged use of compression stockings and continuing with leg elevation.  Patient's blood pressure has been well-controlled, no indication to escalate GDMT at this time.  Reviewed ED precautions.  Postop pericarditis: Patient noted to have slight ST elevation on EKG postop and possible friction rub heard on auscultation.  Started on colchicine 0.6 mg daily. Today reports that he is doing well, denies any chest pain, pressure or tightness.  Will continue patient on colchicine 0.6 mg daily for 8 weeks.  Reviewed ED precautions.  Post op afib: Patient with postop atrial fibrillation, loaded on amiodarone. Today he reports that he is doing well, denies  any palpitations or feeling of irregular heartbeats. EKG today indicates normal sinus rhythm.  Patient's cardiac monitor has not yet resulted, given history of hyperthyroidism on methimazole  if no evidence of atrial fibrillation on monitor would consider discontinuation of amiodarone.  CKD stage IIIB: Last creatinine 2.2, per notes baseline is 2.0.  Patient to have BMET today.  Monitored and managed per PCP.  HTN: Blood pressure today 115/63.  Continue current antihypertensive regimen.  HLD: Last lipid profile on 05/29/2024 indicated total cholesterol 247, HDL 70, triglycerides 133 and LDL 150.  Continue Crestor  40 mg daily.   Disposition: F/u with Manroop Jakubowicz, NP in 6-8 weeks.   Signed, Winfred Iiams D Orlin Kann, NP

## 2024-06-21 ENCOUNTER — Encounter: Payer: Self-pay | Admitting: Cardiology

## 2024-06-21 ENCOUNTER — Ambulatory Visit: Attending: Cardiology | Admitting: Cardiology

## 2024-06-21 VITALS — BP 115/63 | HR 62 | Ht 72.0 in | Wt 171.0 lb

## 2024-06-21 DIAGNOSIS — I319 Disease of pericardium, unspecified: Secondary | ICD-10-CM | POA: Diagnosis not present

## 2024-06-21 DIAGNOSIS — I10 Essential (primary) hypertension: Secondary | ICD-10-CM | POA: Diagnosis not present

## 2024-06-21 DIAGNOSIS — E785 Hyperlipidemia, unspecified: Secondary | ICD-10-CM | POA: Diagnosis not present

## 2024-06-21 DIAGNOSIS — Z951 Presence of aortocoronary bypass graft: Secondary | ICD-10-CM | POA: Diagnosis not present

## 2024-06-21 DIAGNOSIS — R7301 Impaired fasting glucose: Secondary | ICD-10-CM | POA: Diagnosis not present

## 2024-06-21 DIAGNOSIS — N1832 Chronic kidney disease, stage 3b: Secondary | ICD-10-CM | POA: Diagnosis not present

## 2024-06-21 DIAGNOSIS — I2510 Atherosclerotic heart disease of native coronary artery without angina pectoris: Secondary | ICD-10-CM

## 2024-06-21 MED ORDER — COLCHICINE 0.6 MG PO TABS: 0.6000 mg | ORAL_TABLET | Freq: Every day | ORAL | 2 refills | Status: AC

## 2024-06-21 MED ORDER — ROSUVASTATIN CALCIUM 40 MG PO TABS: 40.0000 mg | ORAL_TABLET | Freq: Every day | ORAL | 12 refills | Status: DC

## 2024-06-21 MED ORDER — AMIODARONE HCL 200 MG PO TABS: 200.0000 mg | ORAL_TABLET | Freq: Every day | ORAL | 1 refills | Status: DC

## 2024-06-21 NOTE — Patient Instructions (Addendum)
 Medication Instructions:  Your physician recommends that you continue on your current medications as directed. Please refer to the Current Medication list given to you today.  *If you need a refill on your cardiac medications before your next appointment, please call your pharmacy*  Lab Work: Lipids and LFT -- 6 to 8 weeks  You may go to any [facility] Location for your lab work:  [facility] - [id number] ([facility]) - [id number] [address]  [place] - [address]   [place]  - [address]   [place] - [address] - [address] [place]  - [address] - [address] ([facility]   If you have labs (blood work) drawn today and your tests are completely normal, you will receive your results only by: [employer] (if you have MyChart)  If you have any lab test that is abnormal or we need to change your treatment, we will call you or send a MyChart message to review the results.  Testing/Procedures: None ordered.  Follow-Up: At [facility], you and your health needs are our priority.  As part of our continuing mission to provide you with exceptional heart care, we have created designated Provider Care Teams.  These Care Teams include your primary Cardiologist (physician) and Advanced Practice Providers (APPs -  Physician Assistants and Nurse Practitioners) who all work together to provide you with the care you need, when you need it.  Your next appointment:   6 weeks  Provider: Katlyn West, NP

## 2024-06-22 ENCOUNTER — Ambulatory Visit: Payer: Self-pay | Admitting: Family Medicine

## 2024-06-22 ENCOUNTER — Encounter: Payer: Self-pay | Admitting: Cardiology

## 2024-06-22 LAB — CMP14+EGFR
ALT: 13 IU/L (ref 0–44)
AST: 13 IU/L (ref 0–40)
Albumin: 3.8 g/dL (ref 3.8–4.8)
Alkaline Phosphatase: 102 IU/L (ref 47–123)
BUN/Creatinine Ratio: 10 (ref 10–24)
BUN: 25 mg/dL (ref 8–27)
Bilirubin Total: 0.4 mg/dL (ref 0.0–1.2)
CO2: 20 mmol/L (ref 20–29)
Calcium: 9.5 mg/dL (ref 8.6–10.2)
Chloride: 104 mmol/L (ref 96–106)
Creatinine, Ser: 2.62 mg/dL — ABNORMAL HIGH (ref 0.76–1.27)
Globulin, Total: 2.6 g/dL (ref 1.5–4.5)
Glucose: 117 mg/dL — ABNORMAL HIGH (ref 70–99)
Potassium: 4.8 mmol/L (ref 3.5–5.2)
Sodium: 138 mmol/L (ref 134–144)
Total Protein: 6.4 g/dL (ref 6.0–8.5)
eGFR: 25 mL/min/1.73 — ABNORMAL LOW (ref >59)

## 2024-06-22 LAB — CBC WITH DIFFERENTIAL/PLATELET
Basophils Absolute: 0.1 x10E3/uL (ref 0.0–0.2)
Basos: 1 %
EOS (ABSOLUTE): 0.5 x10E3/uL — ABNORMAL HIGH (ref 0.0–0.4)
Eos: 7 %
Hematocrit: 29.3 % — ABNORMAL LOW (ref 37.5–51.0)
Hemoglobin: 9.2 g/dL — ABNORMAL LOW (ref 13.0–17.7)
Immature Grans (Abs): 0 x10E3/uL (ref 0.0–0.1)
Immature Granulocytes: 0 %
Lymphocytes Absolute: 1.2 x10E3/uL (ref 0.7–3.1)
Lymphs: 16 %
MCH: 28.6 pg (ref 26.6–33.0)
MCHC: 31.4 g/dL — ABNORMAL LOW (ref 31.5–35.7)
MCV: 91 fL (ref 79–97)
Monocytes Absolute: 0.8 x10E3/uL (ref 0.1–0.9)
Monocytes: 11 %
Neutrophils Absolute: 4.9 x10E3/uL (ref 1.4–7.0)
Neutrophils: 65 %
Platelets: 494 x10E3/uL — ABNORMAL HIGH (ref 150–450)
RBC: 3.22 x10E6/uL — ABNORMAL LOW (ref 4.14–5.80)
RDW: 13.3 % (ref 11.6–15.4)
WBC: 7.4 x10E3/uL (ref 3.4–10.8)

## 2024-06-22 NOTE — Progress Notes (Signed)
 Hi Victor Price, kidney function is still hanging around 2.6.  This is a bit of a jump since you last saw your nephrologist.  I would really like to get you back in with him sooner rather than later.  Do you already have an upcoming appointment in the next 6 to 8 weeks?  If not I will be happy to place a new referral

## 2024-06-28 ENCOUNTER — Other Ambulatory Visit: Payer: Self-pay | Admitting: Thoracic Surgery (Cardiothoracic Vascular Surgery)

## 2024-06-29 ENCOUNTER — Other Ambulatory Visit: Payer: Self-pay | Admitting: Thoracic Surgery (Cardiothoracic Vascular Surgery)

## 2024-06-29 DIAGNOSIS — Z951 Presence of aortocoronary bypass graft: Secondary | ICD-10-CM

## 2024-07-01 DIAGNOSIS — I4891 Unspecified atrial fibrillation: Secondary | ICD-10-CM

## 2024-07-02 ENCOUNTER — Ambulatory Visit (INDEPENDENT_AMBULATORY_CARE_PROVIDER_SITE_OTHER): Payer: Self-pay

## 2024-07-02 ENCOUNTER — Ambulatory Visit: Payer: Self-pay | Admitting: Family Medicine

## 2024-07-02 ENCOUNTER — Ambulatory Visit
Admission: RE | Admit: 2024-07-02 | Discharge: 2024-07-02 | Disposition: A | Discharge status: Home / Self Care | Payer: Self-pay | Source: Ambulatory Visit | Attending: Cardiovascular Disease | Admitting: Cardiovascular Disease

## 2024-07-02 VITALS — BP 157/75 | HR 65 | Resp 20 | Ht 72.0 in | Wt 170.0 lb

## 2024-07-02 DIAGNOSIS — J929 Pleural plaque without asbestos: Secondary | ICD-10-CM | POA: Diagnosis not present

## 2024-07-02 DIAGNOSIS — Z951 Presence of aortocoronary bypass graft: Secondary | ICD-10-CM | POA: Diagnosis present

## 2024-07-02 DIAGNOSIS — I700 Atherosclerosis of aorta: Secondary | ICD-10-CM | POA: Diagnosis not present

## 2024-07-02 DIAGNOSIS — J984 Other disorders of lung: Secondary | ICD-10-CM | POA: Diagnosis not present

## 2024-07-02 NOTE — Progress Notes (Signed)
 [address] [place] [address]             [phone]       HPI:  Patient returns for routine postoperative follow-up having undergone coronary artery bypass grafting x4 (left internal mammary artery to LAD, saphenous vein graft to first diagonal, saphenous vein graft to obtuse marginal 1, saphenous vein graft to posterior  descending) and Endoscopic vein harvest right leg on 05/31/2024 with Dr. Kerrin.   The patient's early postoperative recovery while in the hospital was notable for being hyperkalemic and started on diuretics. His creatinine increased to 2.8.  He was started on lasix  drip by advanced heart failure team. His creatinine decreased almost back to baseline at 2.2. He developed atrial fibrillation and was started on IV amiodarone .  He converted to normal sinus rhythm and was transitioned to oral amiodarone  prior to discharge.  He was sent home with zio patch.  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 He was stable for discharge home on 06/05/2024.  He presents to the clinic today for continued follow up.  He reports that he has been doing well.  He has noticed that he has more fatigue especially in the afternoon.  This has slowly started to improve since surgery.  His pain is well controlled and he is no longer requiring any medications. He has been active and walks.  He denies chest pain, shortness of breath and lower leg edema.    Allergies as of 07/02/2024       Reactions   Losartan  Other (See Comments)   Lightheadedness, dizziness, syncope   Metoprolol  Other (See Comments)   dizziness   Nsaids Other (See Comments)   CKD 3         Medication List        Accurate as of July 02, 2024  1:29 PM. If you have any questions, ask your nurse or doctor.          amiodarone  200 MG tablet Commonly known as: PACERONE  Take 1 tablet (200 mg total) by mouth daily.   Anoro Ellipta  62.5-25 MCG/ACT Aepb Generic  drug: umeclidinium-vilanterol Inhale 1 puff into the lungs daily.   aspirin  EC 81 MG tablet Take 1 tablet (81 mg total) by mouth daily. Swallow whole.   bisoprolol  10 MG tablet Commonly known as: ZEBETA  Take 1 tablet (10 mg total) by mouth daily.   Blood Glucose Monitoring Suppl Devi 1 each by Does not apply route as directed. Dispense based on patient and insurance preference. Use up to four times daily as directed. (FOR ICD-10 E10.9, E11.9).   BLOOD GLUCOSE TEST STRIPS Strp 1 each by Does not apply route daily as needed. Dispense based on patient and insurance preference. Use up to four times daily as directed. (FOR ICD-10 E10.9, E11.9).   clopidogrel  75 MG tablet Commonly known as: PLAVIX  Take 1 tablet (75 mg total) by mouth daily.   colchicine  0.6 MG tablet Take 1 tablet (0.6 mg total) by mouth daily.   [facility] Allergy  Relief-D 10-240 MG 24 hr tablet Generic drug: loratadine -pseudoephedrine  TAKE 1 TABLET BY MOUTH EVERY DAY   fluticasone  50 MCG/ACT nasal spray Commonly known as: FLONASE  Place 2 sprays into both nostrils daily as needed for allergies.   gabapentin  600 MG tablet Commonly known as: Neurontin  Take 0.5 tablets (300 mg total) by mouth 2 (two) times daily.   ipratropium 0.03 % nasal spray Commonly known as:  ATROVENT  Place 2 sprays into both nostrils daily as needed for rhinitis (runny nose).   Lancet Device Misc 1 each by Does not apply route daily as needed. Dispense based on patient and insurance preference. Use up to four times daily as directed. (FOR ICD-10 E10.9, E11.9).   Lancets Misc 1 each by Does not apply route daily as needed. Dispense based on patient and insurance preference. Use up to four times daily as directed. (FOR ICD-10 E10.9, E11.9).   methimazole  10 MG tablet Commonly known as: TAPAZOLE  Take 1 tablet (10 mg total) by mouth daily.   rosuvastatin  40 MG tablet Commonly known as: CRESTOR  Take 1 tablet (40 mg total) by mouth daily.    sildenafil  20 MG tablet Commonly known as: REVATIO  Take 1 tablet (20 mg total) by mouth daily.   tamsulosin  0.4 MG Caps capsule Commonly known as: FLOMAX  TAKE 1 CAPSULE (0.4 MG TOTAL) BY MOUTH IN THE MORNING AND AT BEDTIME   Testosterone  12.5 MG/ACT (1%) Gel APPLY 1 PUMP TO AN INNER THIGH AND 1 PUMPS TO OPPOSITE INNER THIGH IN AM   traZODone  50 MG tablet Commonly known as: DESYREL  Take 2 tablets (100 mg total) by mouth at bedtime.   triamcinolone  cream 0.1 % Commonly known as: KENALOG  Apply 1 Application topically 2 (two) times daily as needed (Rash).         ROS  Review of Systems  Constitutional:  Negative for malaise/fatigue.  Respiratory: Negative.  Negative for cough and shortness of breath.   Cardiovascular: Negative.  Negative for chest pain and leg swelling.  Neurological: Negative.  Negative for dizziness and headaches.  All other systems reviewed and are negative.    Ht 6' (1.829 m)   BMI 23.19 kg/m   Physical Exam Constitutional:      Appearance: Normal appearance.  HENT:     Head: Normocephalic and atraumatic.  Cardiovascular:     Rate and Rhythm: Normal rate and regular rhythm.     Heart sounds: Normal heart sounds, S1 normal and S2 normal.  Pulmonary:     Effort: Pulmonary effort is normal.     Breath sounds: Normal breath sounds.  Skin:    General: Skin is warm and dry.      Neurological:     General: No focal deficit present.     Mental Status: He is alert and oriented to person, place, and time.       Imaging: CLINICAL DATA:  CABG.   EXAM: CHEST - 2 VIEW   COMPARISON:  06/11/2024 and CT chest 02/15/2022.   FINDINGS: Trachea is midline. Heart size within normal limits. 6 intact sternotomy wires. Minimal thoracic aortic calcification. Mild pleuroparenchymal scarring at the base of the left hemithorax. Lungs are otherwise clear. Mild biapical pleural thickening.   IMPRESSION: Pleuroparenchymal scarring at the base of the left  hemithorax. No acute findings.     Electronically Signed   By: Newell Eke M.D.   On: 07/02/2024 14:00   Assessment/Plan:  S/P CABG x 4 -We reviewed today's chest x ray.  - We discussed driving and he is able to start since he is no longer requiring narcotics for pain.  First time driving should be a short distance in the daytime and he can increase from there -Discussed participation in cardiac rehab and he is cleared from a surgical standpoint at this time -He should continue sternal precautions until a full 6 weeks from surgery.  Continue to avoid lifting over 10 pounds until a  full 3 months from surgery -He should increase his activity/exercise as tolerated -Discussed that fatigue/deconditioning after surgery can be common.He has started to notice improvement.  If this persist may be due to initiation of new medications postsurgery. -He has been seen by cardiology and he is to continue amiodarone  200 mg daily, aspirin  81 mg daily, bisoprolol  10 mg daily, Plavix  75 mg daily, colchicine  0.6 mg daily, Crestor  40 mg daily.  Recent BMP showed increase in creatinine, he has follow-up with nephrology on 07/19/2024.  He was on Farxiga  prior to admission and he can discuss restarting with nephrologist.  -Follow up with [facility] as needed   Manuelita CHRISTELLA Rough, PA-C 1:29 PM 07/02/24

## 2024-07-02 NOTE — Progress Notes (Signed)
 Hi Victor Price, overall rhythm was normal on your heart monitor.  Just a run of fast beat that only lasted about 6 beat in a row and it only happened once so nothing concerning.

## 2024-07-02 NOTE — Patient Instructions (Signed)

## 2024-07-06 ENCOUNTER — Other Ambulatory Visit: Payer: Self-pay | Admitting: Family Medicine

## 2024-07-09 ENCOUNTER — Other Ambulatory Visit (HOSPITAL_COMMUNITY): Payer: Self-pay

## 2024-07-13 ENCOUNTER — Other Ambulatory Visit: Payer: Self-pay | Admitting: Cardiology

## 2024-07-19 DIAGNOSIS — N184 Chronic kidney disease, stage 4 (severe): Secondary | ICD-10-CM | POA: Diagnosis not present

## 2024-07-20 NOTE — Progress Notes (Signed)
 Victor Price                                          MRN: [id number]   07/20/2024   The [facility] Quality Team Specialist reviewed this patient medical record for the purposes of chart review for care gap closure. The following were reviewed: abstraction for care gap closure-controlling blood pressure.    [facility] Quality Team

## 2024-07-29 NOTE — Progress Notes (Unsigned)
 "  Cardiology Office Note    Date:  07/30/2024  ID:  Victor, Price 1951/08/03, MRN [id number] PCP:  Delford Maude BROCKS, MD  Cardiologist:  Lynwood Schilling, MD  Electrophysiologist:  None   Chief Complaint: Follow up for CAD  History of Present Illness: Victor Price   Victor Price is a 73 y.o. male with visit-pertinent history of CAD s/p CABG x 4 with LIMA to LAD, SVG to first diagonal, SVG to obtuse marginal 1, SVG to posterior descending on 05/31/2024 with Dr. Kerrin.  Patient with history of hypertension, CKD stage III, hypothyroidism/Graves' disease, hyperlipidemia and BPH.   Patient presented to urgent care on 10/17 with complaints of chest pain that have been occurring for several days.  He denied any shortness of breath, EKG showed normal sinus rhythm.  On 10/18 he presented to [facility] emergency department with complaints of intermittent chest pain with episodes that occurred 6 times over the prior 3 to 4 days and lasted 20 minutes each.  He denied any associated symptoms EKG remained normal with minimal elevation in troponin level to 81.  Echo on 05/28/2024 indicated LVEF 60 to 65%, no RWMA, diastolic parameters consistent with G1 DD, RV systolic function and size was normal, trivial mitral regurgitation with no stenosis, aortic valve sclerosis was present without evidence of stenosis mild dilation of the aortic root measuring 41 mm.  Cardiac catheterization on 05/29/2024 indicated severe multivessel CAD with recommendation for CVTS consultation.   Patient underwent CABG x 4 on 05/31/2024 with Dr. Kerrin. Patient's postoperative period notable for hyperkalemia, started on diuretics, was followed by advanced heart failure team as he required a Lasix  drip.  Echo on 05/28/2024 indicated LVEF 60 to 65%, RV systolic function was mildly reduced, RV size was normal, no evidence of mitral valve regurgitation or stenosis, aortic valve sclerosis present without stenosis.  He developed atrial  fibrillation and was loaded on amiodarone , transition to oral amiodarone  prior to discharge.  Patient was also started on colchicine  for postoperative pericarditis, it was noted that patient had a pericardial friction rub.  Patient was discharged home in stable condition on 06/05/2024.  Patient was discharged with live telemetry monitor in place, results not yet available.   He was seen by TCTS on 06/11/2024 and remained stable for cardiac standpoint.  Patient was last seen in clinic on 06/21/24 for follow up. He reports that he was doing well overall.  He has been regularly walking on his treadmill and was tolerating well.  He did note some slight shortness of breath that resolved with rest.  He felt that he was progressing well just noted some overall fatigue to have been improving.  Today he presents for follow-up.  He reports that he has been doing well.  He denies any chest pain, palpitations, presyncope or syncope.  He notes that his right ankle swelling has been significantly improved.  He does note some fatigue and shortness of breath that has been improving each week.  He remains active doing household chores and climbing stairs, overall tolerating well.  Patient reports that each week he feels that he is making more progress.  He denies any cardiac concerns or complaints today.  Labwork independently reviewed: 07/19/24: Creatinine 2.42, sodium 137, Tessman 4.9 ROS: .   Today he denies chest pain, lower extremity edema, fatigue, palpitations, melena, hematuria, hemoptysis, diaphoresis, weakness, presyncope, syncope, orthopnea, and PND.  All other systems are reviewed and otherwise negative. Studies Reviewed: Victor Price   EKG:  EKG  is not ordered today.  CV Studies: Cardiac studies reviewed are outlined and summarized above. Otherwise please see EMR for full report. Cardiac Studies & Procedures   ______________________________________________________________________________________________ CARDIAC  CATHETERIZATION  CARDIAC CATHETERIZATION 05/29/2024  Conclusion Images from the original result were not included.    Prox RCA-1 lesion is 20% stenosed.  Prox RCA-2 lesion is 70% stenosed.   Dist RCA-1 lesion is 95% stenosed.   Dist RCA-2 lesion is 85% stenosed.  RPDA lesion is 85% stenosed.   Ost LAD to Mid LAD lesion is 20% stenosed with 70% stenosed side branch in 1st Diag.   Mid LAD-1 lesion is 50% stenosed.  Mid LAD-2 lesion is 70% stenosed. Mid LAD-3 lesion is 50% stenosed with 40% stenosed side branch in 2nd Diag. (VFFR for both the sidebranch and LAD was 0.77)   Mid Cx to Dist Cx lesion is 50% stenosed with 60% stenosed side branch in 2nd Mrg.   ------------------------------------   LV end diastolic pressure is normal.  There is no aortic valve stenosis.  Dominance: Right  Severe Multivessel CAD: 20% proximal RCA followed by 70% eccentric lesion in the prox-mid RCA followed by the CULPRIT LESION which is a 95% focal lesion in the distal RCA and then an extensive 85% stenosis from distal RCA into PDA crossing the PAV Heavily calcified Left Main into the LAD and LCx: Minimal proximal disease with 70% ostial to proximal 1st Diag, 50 with focal 70% stenosis mid LAD prior to 2nd Diag then 40% at the bifurcation of D2 with 50% ostial 2nd Diag (the FFR of the 70% LAD lesion was 0.77 for both the LAD and 2nd Diag) 50 to 60% proximal LCx into the AV groove and OM1 branch. Borderline significant. Normal LVEDP   RECOMMENDATIONS   Will place referral for CVTS consultation; continue to titrate GDMT for CAD   Will restart IV heparin  8 hours post TR band Removal   Recommend Aspirin  81mg  daily for moderate CAD.    Alm Clay, MD  Findings Coronary Findings Diagnostic  Dominance: Right  Left Anterior Descending The vessel is moderately calcified. Proximal third of the vessel Ost LAD to Mid LAD lesion is 20% stenosed with 70% stenosed side branch in 1st Diag. Mid LAD-1 lesion is  50% stenosed. Mid LAD-2 lesion is 70% stenosed. Mid LAD-3 lesion is 50% stenosed with 40% stenosed side branch in 2nd Diag. Pressure gradient was performed on the lesion. Virtual FFR: 77. Cath Works  First Biomedical Scientist is small in size.  Second Diagonal Branch Vessel is moderate in size.  Second Septal Branch Vessel is small in size.  Third Diagonal Branch Vessel is small in size.  Left Circumflex Vessel is large. Mid Cx to Dist Cx lesion is 50% stenosed with 60% stenosed side branch in 2nd Mrg.  First Obtuse Marginal Branch Vessel is small in size.  Second Obtuse Marginal Branch Vessel is moderate in size.  First Left Posterolateral Branch Vessel is moderate in size.  Left Posterior Atrioventricular Artery Vessel is moderate in size.  Right Coronary Artery Vessel was injected. Vessel is large. Prox RCA-1 lesion is 20% stenosed. Prox RCA-2 lesion is 70% stenosed. Dist RCA-1 lesion is 95% stenosed. Vessel is the culprit lesion. Dist RCA-2 lesion is 85% stenosed. The lesion is located at the bifurcation, eccentric and ulcerative.  Acute Marginal Branch Vessel is small in size.  Right Ventricular Branch Vessel is small in size.  Right Posterior Descending Artery RPDA lesion is 85% stenosed. The lesion  is located at the bifurcation, eccentric and ulcerative.  First Right Posterolateral Branch Vessel is small in size.  Second Right Posterolateral Branch Vessel is moderate in size.  Intervention  No interventions have been documented.     ECHOCARDIOGRAM  ECHOCARDIOGRAM LIMITED 06/01/2024  Narrative ECHOCARDIOGRAM LIMITED REPORT    Patient Name:   PESACH FRISCH Date of Exam: 06/01/2024 Medical Rec #:  [id number]      Height:       72.0 in Accession #:    [id number]     Weight:       190.3 lb Date of Birth:  1950/12/29       BSA:          2.086 m Patient Age:    73 years       BP:           108/75 mmHg Patient Gender: M              HR:            94 bpm. Exam Location:  Inpatient  Procedure: 2D Echo and Limited Echo (Both Spectral and Color Flow Doppler were utilized during procedure).  Indications:    CHF I50.9  History:        Patient has prior history of Echocardiogram examinations, most recent 05/28/2024. Risk Factors:Hypertension. NSTEMI, CABG, Precordial chest pain, Hyperlipidemia.  Sonographer:    BERNARDA ROCKS Referring Phys: [id number] JORDAN LEE   Sonographer Comments: Technically difficult due to S/P CABG. IMPRESSIONS   1. Left ventricular ejection fraction, by estimation, is 60 to 65%. The left ventricle has normal function. 2. Right ventricular systolic function is mildly reduced. The right ventricular size is normal. 3. The mitral valve is normal in structure. No evidence of mitral valve regurgitation. 4. The aortic valve was not well visualized. There is mild calcification of the aortic valve. Aortic valve sclerosis/calcification is present, without any evidence of aortic stenosis.  FINDINGS Left Ventricle: Left ventricular ejection fraction, by estimation, is 60 to 65%. The left ventricle has normal function.  Right Ventricle: The right ventricular size is normal. No increase in right ventricular wall thickness. Right ventricular systolic function is mildly reduced.  Left [facility]: Left atrial size was normal in size.  Right [facility]: Right atrial size was normal in size.  Pericardium: There is no evidence of pericardial effusion.  Mitral Valve: The mitral valve is normal in structure.  Tricuspid Valve: The tricuspid valve is normal in structure.  Aortic Valve: The aortic valve was not well visualized. There is mild calcification of the aortic valve. Aortic valve sclerosis/calcification is present, without any evidence of aortic stenosis.  Pulmonic Valve: The pulmonic valve was not assessed.  Aorta: The aortic root is normal in size and structure.  Venous: The inferior vena cava was not well  visualized.  LEFT VENTRICLE PLAX 2D LVIDd:         3.20 cm LVIDs:         2.10 cm LV PW:         1.00 cm LV IVS:        1.00 cm LVOT diam:     2.00 cm LVOT Area:     3.14 cm  LV Volumes (MOD) LV vol d, MOD A2C: 90.7 ml LV vol d, MOD A4C: 98.8 ml LV vol s, MOD A2C: 38.3 ml LV vol s, MOD A4C: 34.2 ml LV SV MOD A2C:     52.4 ml LV SV MOD A4C:  98.8 ml LV SV MOD BP:      59.5 ml  RIGHT VENTRICLE RV Basal diam:  3.50 cm TAPSE (M-mode): 1.5 cm  LEFT ATRIUM             Index        RIGHT ATRIUM           Index LA diam:        3.90 cm 1.87 cm/m   RA Area:     14.80 cm LA Vol (A2C):   53.6 ml 25.70 ml/m  RA Volume:   38.70 ml  18.55 ml/m LA Vol (A4C):   37.5 ml 17.98 ml/m LA Biplane Vol: 45.4 ml 21.77 ml/m  AORTA Ao Root diam: 3.70 cm   SHUNTS Systemic Diam: 2.00 cm  Toribio Fuel MD Electronically signed by Toribio Fuel MD Signature Date/Time: 06/01/2024/4:40:38 PM    Final   TEE  ECHO INTRAOPERATIVE TEE 05/31/2024  Narrative *INTRAOPERATIVE TRANSESOPHAGEAL REPORT *    Patient Name:   JOHNTAVIUS SHEPARD Date of Exam: 05/31/2024 Medical Rec #:  [id number]      Height:       72.0 in Accession #:    [id number]     Weight:       162.0 lb Date of Birth:  1951-03-26       BSA:          1.95 m Patient Age:    73 years       BP:           156/92 mmHg Patient Gender: M              HR:           65 bpm. Exam Location:  Inpatient  Transesophogeal exam was perform intraoperatively during surgical procedure. Patient was closely monitored under general anesthesia during the entirety of examination.  Indications:     CABG Performing Phys: [id number] ERIN R BARRETT Diagnosing Phys: Norleen Pope MD  Complications: No known complications during this procedure. POST-OP IMPRESSIONS _ Left Ventricle: has normal systolic function, with an ejection fraction of 65-70%. _ Right Ventricle: The right ventricle appears unchanged from pre-bypass. _ Aorta: The aorta  appears unchanged from pre-bypass. _ Left Atrial Appendage: The left atrial appendage appears unchanged from pre-bypass. _ Aortic Valve: The aortic valve appears unchanged from pre-bypass. _ Mitral Valve: The mitral valve appears unchanged from pre-bypass. _ Tricuspid Valve: The tricuspid valve appears unchanged from pre-bypass. _ Pulmonic Valve: The pulmonic valve appears unchanged from pre-bypass. _ Interatrial Septum: The interatrial septum appears unchanged from pre-bypass.  PRE-OP FINDINGS Left Ventricle: The left ventricle has normal systolic function, with an ejection fraction of 55-60%. The cavity size was normal. There is no left ventricular hypertrophy.   Right Ventricle: The right ventricle has normal systolic function. The cavity was normal. There is no increase in right ventricular wall thickness.  Left [facility]: Left atrial size was normal in size. No left atrial/left atrial appendage thrombus was detected. The left atrial appendage is well visualized and there is no evidence of thrombus present.  Right [facility]: Right atrial size was normal in size.  Interatrial Septum: No atrial level shunt detected by color flow Doppler.  Pericardium: There is no evidence of pericardial effusion.  Mitral Valve: The mitral valve is normal in structure. Mitral valve regurgitation is trivial by color flow Doppler. There is no evidence of mitral valve vegetation.  Tricuspid Valve: The tricuspid valve was normal in structure. Tricuspid valve regurgitation  is trivial by color flow Doppler. There is no evidence of tricuspid valve vegetation.  Aortic Valve: The aortic valve is tricuspid Aortic valve regurgitation was not visualized by color flow Doppler. There is no stenosis of the aortic valve.   Pulmonic Valve: The pulmonic valve was normal in structure. Pulmonic valve regurgitation is mild by color flow Doppler.   Aorta: The aortic root, ascending aorta and aortic arch are normal in size and  structure. There is evidence of plaque in the descending aorta.  +-------------+-----------++ AORTIC VALVE             +-------------+-----------++ AV Vmax:     101.00 cm/s +-------------+-----------++ AV Vmean:    75.200 cm/s +-------------+-----------++ AV VTI:      0.220 m     +-------------+-----------++ AV Peak Grad:4.1 mmHg    +-------------+-----------++ AV Mean Grad:3.0 mmHg    +-------------+-----------++   Norleen Pope MD Electronically signed by Norleen Pope MD Signature Date/Time: 05/31/2024/4:19:58 PM    Final  MONITORS  LONG TERM MONITOR-LIVE TELEMETRY (3-14 DAYS) 06/26/2024  Narrative Predominant rhythm was normal sinus One run of supraventricular tachycardia lasting 6 beats No sustained arrhythmias       ______________________________________________________________________________________________       Current Reported Medications:.    Active Medications[1]  Physical Exam:    VS:  BP 118/66   Pulse 65   Ht 6' (1.829 m)   Wt 163 lb 12.8 oz (74.3 kg)   SpO2 99%   BMI 22.22 kg/m    Wt Readings from Last 3 Encounters:  07/30/24 163 lb 12.8 oz (74.3 kg)  07/02/24 170 lb (77.1 kg)  06/21/24 171 lb (77.6 kg)    GEN: Well nourished, well developed in no acute distress NECK: No JVD; No carotid bruits CARDIAC: RRR, no murmurs, rubs, gallops RESPIRATORY:  Clear to auscultation without rales, wheezing or rhonchi  ABDOMEN: Soft, non-tender, non-distended EXTREMITIES:  No edema; No acute deformity     Asessement and Plan:.    CAD: S/p CABG x 4 on 05/31/2024 with Dr. Kerrin.  Today he reports that he has been doing well.  Denies any chest pain. He notes some fatigue and shortness of breath with increased exertion that he feels has been improving each week. Reviewed ED precautions. Heart healthy diet and regular cardiovascular exercise encouraged.  Continue aspirin  81 mg daily, Plavix  75 mg daily, Crestor  40 mg  daily. Check CBC, CMET and fasting lipid profile.   HFpEF: Pre-op echo and intra-op echo with normal EF 65-70%, G1DD, and normal RV. Echo 10/24 EF 60-65% RV mild HK no effusion. Patient denies any increased shortness of breath or lower extremity edema.  Patient notes some dyspnea with increased exertion that has been improving each week. He appears euvolemic and well compensated on exam.  Postop pericarditis:Patient noted to have slight ST elevation on EKG postop and possible friction rub heard on auscultation.  Today he denies any chest pain, has been tolerating colchicine  well.  No friction rub appreciated on exam.  He will finish colchicine  in 2 to 3 weeks.  Postop atrial fibrillation: Patient with postoperative atrial fibrillation, loaded on amiodarone . Patient's cardiac monitor showed predominant rhythm was normal sinus rhythm, he had 1 run of supraventricular tachycardia lasting 6 beats.  No evidence of stained arrhythmias or atrial fibrillation.  He denies any palpitations or feeling of irregular heartbeats.  Rate and rhythm regular on auscultation.  Discontinue amiodarone .  CKD stage IIIb: Last creatinine 2.42 on 07/19/2024.  He was recently restarted  on Farxiga  10 mg daily by his nephrologist. Followed by nephrology at [facility]. Check CMET.   Hypertension: Blood pressure today 118/66.  Patient is not requiring antihypertensive medications.  Hyperlipidemia: Last lipid profile on 05/29/2024 indicated total cholesterol 247, HDL 70, triglycerides 133 and LDL 150.  Check fasting lipid profile and CMET. Will need to consider stopping Crestor  pending renal function.    Disposition: F/u with Dr. Lavona in three months or sooner if needed.   Signed, Johnatan Baskette D Corrigan Kretschmer, NP       [1]  Current Meds  Medication Sig   aspirin  EC 81 MG tablet Take 1 tablet (81 mg total) by mouth daily. Swallow whole.   bisoprolol  (ZEBETA ) 10 MG tablet Take 1 tablet (10 mg total) by mouth daily.   Blood Glucose  Monitoring Suppl DEVI 1 each by Does not apply route as directed. Dispense based on patient and insurance preference. Use up to four times daily as directed. (FOR ICD-10 E10.9, E11.9).   clopidogrel  (PLAVIX ) 75 MG tablet Take 1 tablet (75 mg total) by mouth daily.   colchicine  0.6 MG tablet Take 1 tablet (0.6 mg total) by mouth daily.   [employer] ALLERGY  RELIEF-D 10-240 MG 24 hr tablet TAKE 1 TABLET BY MOUTH EVERY DAY   FARXIGA  10 MG TABS tablet Take 10 mg by mouth daily.   fluticasone  (FLONASE ) 50 MCG/ACT nasal spray Place 2 sprays into both nostrils daily as needed for allergies.   gabapentin  (NEURONTIN ) 600 MG tablet Take 0.5 tablets (300 mg total) by mouth 2 (two) times daily.   Glucose Blood (BLOOD GLUCOSE TEST STRIPS) STRP 1 each by Does not apply route daily as needed. Dispense based on patient and insurance preference. Use up to four times daily as directed. (FOR ICD-10 E10.9, E11.9).   ipratropium (ATROVENT ) 0.03 % nasal spray Place 2 sprays into both nostrils daily as needed for rhinitis (runny nose).   Lancet Device MISC 1 each by Does not apply route daily as needed. Dispense based on patient and insurance preference. Use up to four times daily as directed. (FOR ICD-10 E10.9, E11.9).   Lancets MISC 1 each by Does not apply route daily as needed. Dispense based on patient and insurance preference. Use up to four times daily as directed. (FOR ICD-10 E10.9, E11.9).   methimazole  (TAPAZOLE ) 10 MG tablet Take 1 tablet (10 mg total) by mouth daily.   rosuvastatin  (CRESTOR ) 40 MG tablet Take 1 tablet (40 mg total) by mouth daily.   sildenafil  (REVATIO ) 20 MG tablet Take 1 tablet (20 mg total) by mouth daily.   tamsulosin  (FLOMAX ) 0.4 MG CAPS capsule TAKE 1 CAPSULE (0.4 MG TOTAL) BY MOUTH IN THE MORNING AND AT BEDTIME   Testosterone  12.5 MG/ACT (1%) GEL APPLY 1 PUMP TO AN INNER THIGH AND 1 PUMPS TO OPPOSITE INNER THIGH IN AM   traZODone  (DESYREL ) 50 MG tablet Take 2 tablets (100 mg total) by mouth at  bedtime.   triamcinolone  cream (KENALOG ) 0.1 % Apply 1 Application topically 2 (two) times daily as needed (Rash).   umeclidinium-vilanterol (ANORO ELLIPTA ) 62.5-25 MCG/ACT AEPB Inhale 1 puff into the lungs daily.   [DISCONTINUED] amiodarone  (PACERONE ) 200 MG tablet TAKE 1 TABLET BY MOUTH EVERY DAY   "

## 2024-07-30 ENCOUNTER — Ambulatory Visit: Attending: Cardiology | Admitting: Cardiology

## 2024-07-30 ENCOUNTER — Encounter: Payer: Self-pay | Admitting: Cardiology

## 2024-07-30 VITALS — BP 118/66 | HR 65 | Ht 72.0 in | Wt 163.8 lb

## 2024-07-30 DIAGNOSIS — E785 Hyperlipidemia, unspecified: Secondary | ICD-10-CM

## 2024-07-30 DIAGNOSIS — Z951 Presence of aortocoronary bypass graft: Secondary | ICD-10-CM

## 2024-07-30 DIAGNOSIS — N1832 Chronic kidney disease, stage 3b: Secondary | ICD-10-CM | POA: Diagnosis not present

## 2024-07-30 DIAGNOSIS — I2510 Atherosclerotic heart disease of native coronary artery without angina pectoris: Secondary | ICD-10-CM | POA: Diagnosis not present

## 2024-07-30 DIAGNOSIS — Z79899 Other long term (current) drug therapy: Secondary | ICD-10-CM | POA: Diagnosis not present

## 2024-07-30 DIAGNOSIS — I319 Disease of pericardium, unspecified: Secondary | ICD-10-CM

## 2024-07-30 NOTE — Patient Instructions (Signed)
 Medication Instructions:  STOP: Amiodarone :  *If you need a refill on your cardiac medications before your next appointment, please call your pharmacy*  Lab Work: This week or next week: Fasting Lipids, CBC, CMET  If you have labs (blood work) drawn today and your tests are completely normal, you will receive your results only by: MyChart Message (if you have MyChart) OR A paper copy in the mail If you have any lab test that is abnormal or we need to change your treatment, we will call you to review the results.  Testing/Procedures: NONE  Follow-Up: At [facility] you and your health needs are our priority.  As part of our continuing mission to provide you with exceptional heart care, our providers are all part of one team.  This team includes your primary Cardiologist (physician) and Advanced Practice Providers or APPs (Physician Assistants and Nurse Practitioners) who all work together to provide you with the care you need, when you need it.  Your next appointment:   3 month(s)  Provider:   Lynwood Schilling, MD

## 2024-07-31 DIAGNOSIS — N1832 Chronic kidney disease, stage 3b: Secondary | ICD-10-CM | POA: Diagnosis not present

## 2024-07-31 DIAGNOSIS — E785 Hyperlipidemia, unspecified: Secondary | ICD-10-CM | POA: Diagnosis not present

## 2024-07-31 DIAGNOSIS — Z79899 Other long term (current) drug therapy: Secondary | ICD-10-CM | POA: Diagnosis not present

## 2024-07-31 DIAGNOSIS — I2510 Atherosclerotic heart disease of native coronary artery without angina pectoris: Secondary | ICD-10-CM | POA: Diagnosis not present

## 2024-07-31 DIAGNOSIS — I319 Disease of pericardium, unspecified: Secondary | ICD-10-CM | POA: Diagnosis not present

## 2024-07-31 LAB — CBC
Hematocrit: 35.1 % — ABNORMAL LOW (ref 37.5–51.0)
Hemoglobin: 10.9 g/dL — ABNORMAL LOW (ref 13.0–17.7)
MCH: 27.9 pg (ref 26.6–33.0)
MCHC: 31.1 g/dL — ABNORMAL LOW (ref 31.5–35.7)
MCV: 90 fL (ref 79–97)
Platelets: 311 x10E3/uL (ref 150–450)
RBC: 3.9 x10E6/uL — ABNORMAL LOW (ref 4.14–5.80)
RDW: 14.4 % (ref 11.6–15.4)
WBC: 5.4 x10E3/uL (ref 3.4–10.8)

## 2024-07-31 LAB — COMPREHENSIVE METABOLIC PANEL WITH GFR
ALT: 23 IU/L (ref 0–44)
AST: 23 IU/L (ref 0–40)
Albumin: 4.6 g/dL (ref 3.8–4.8)
Alkaline Phosphatase: 91 IU/L (ref 47–123)
BUN/Creatinine Ratio: 10 (ref 10–24)
BUN: 26 mg/dL (ref 8–27)
Bilirubin Total: 0.6 mg/dL (ref 0.0–1.2)
CO2: 19 mmol/L — ABNORMAL LOW (ref 20–29)
Calcium: 9.6 mg/dL (ref 8.6–10.2)
Chloride: 100 mmol/L (ref 96–106)
Creatinine, Ser: 2.72 mg/dL — ABNORMAL HIGH (ref 0.76–1.27)
Globulin, Total: 2.5 g/dL (ref 1.5–4.5)
Glucose: 93 mg/dL (ref 70–99)
Potassium: 5 mmol/L (ref 3.5–5.2)
Sodium: 137 mmol/L (ref 134–144)
Total Protein: 7.1 g/dL (ref 6.0–8.5)
eGFR: 24 mL/min/1.73 — ABNORMAL LOW

## 2024-07-31 LAB — LIPID PANEL
Chol/HDL Ratio: 2.1 ratio (ref 0.0–5.0)
Cholesterol, Total: 176 mg/dL (ref 100–199)
HDL: 82 mg/dL
LDL Chol Calc (NIH): 76 mg/dL (ref 0–99)
Triglycerides: 99 mg/dL (ref 0–149)
VLDL Cholesterol Cal: 18 mg/dL (ref 5–40)

## 2024-07-31 NOTE — Progress Notes (Signed)
 Victor Price                                          MRN: [id number]   07/31/2024   The [facility] Quality Team Specialist reviewed this patient medical record for the purposes of chart review for care gap closure. The following were reviewed: abstraction for care gap closure-controlling blood pressure.    [facility]I Quality Team

## 2024-08-03 ENCOUNTER — Other Ambulatory Visit: Payer: Self-pay | Admitting: Family Medicine

## 2024-08-03 DIAGNOSIS — E0590 Thyrotoxicosis, unspecified without thyrotoxic crisis or storm: Secondary | ICD-10-CM

## 2024-08-06 ENCOUNTER — Ambulatory Visit: Payer: Self-pay | Admitting: Cardiology

## 2024-08-06 DIAGNOSIS — E785 Hyperlipidemia, unspecified: Secondary | ICD-10-CM

## 2024-08-10 MED ORDER — EZETIMIBE 10 MG PO TABS: 10.0000 mg | ORAL_TABLET | Freq: Every day | ORAL | 3 refills | Status: AC

## 2024-08-10 MED ORDER — ATORVASTATIN CALCIUM 80 MG PO TABS: 80.0000 mg | ORAL_TABLET | Freq: Every day | ORAL | 3 refills | Status: AC

## 2024-08-10 NOTE — Telephone Encounter (Signed)
 West, Katlyn D, NP to Victor Price LABOR, CMA     08/06/24 12:54 PM Result Note Resulted to MyChart. If patient agreeable stop Rosuvastatin  and start atorvastatin  80 mg daily and Zetia 10 mg daily.  He will need fasting lipid profile and LFTs in 8 weeks.  Atorvastatin  and Zetia sent to pt's pharmacy. Lab work released. [facility] message sent to pt

## 2024-08-16 MED ORDER — BISOPROLOL FUMARATE 10 MG PO TABS: 10.0000 mg | ORAL_TABLET | Freq: Every day | ORAL | 1 refills | Status: AC

## 2024-08-17 NOTE — Progress Notes (Signed)
 Victor Price                                          MRN: [id number]   08/17/2024   The [facility]BCI Quality Team Specialist reviewed this patient medical record for the purposes of chart review for care gap closure. The following were reviewed: erroneous encounter.    VBCI Quality Team

## 2024-08-18 ENCOUNTER — Other Ambulatory Visit: Payer: Self-pay | Admitting: Family Medicine

## 2024-08-18 DIAGNOSIS — E162 Hypoglycemia, unspecified: Secondary | ICD-10-CM

## 2024-08-22 MED ORDER — CLOPIDOGREL BISULFATE 75 MG PO TABS: 75.0000 mg | ORAL_TABLET | Freq: Every day | ORAL | 1 refills | Status: AC

## 2024-10-25 ENCOUNTER — Ambulatory Visit: Admitting: Cardiology

## 2024-11-05 ENCOUNTER — Ambulatory Visit: Admitting: Family Medicine

## 2025-01-03 ENCOUNTER — Ambulatory Visit
